# Patient Record
Sex: Male | Born: 1940
Health system: Southern US, Community
[De-identification: ages and names within clinical notes are randomized; demographics above are authoritative.]

## PROBLEM LIST (undated history)

## (undated) DIAGNOSIS — E875 Hyperkalemia: Secondary | ICD-10-CM

## (undated) DIAGNOSIS — E119 Type 2 diabetes mellitus without complications: Secondary | ICD-10-CM

## (undated) DIAGNOSIS — K219 Gastro-esophageal reflux disease without esophagitis: Secondary | ICD-10-CM

## (undated) DIAGNOSIS — D631 Anemia in chronic kidney disease: Secondary | ICD-10-CM

## (undated) DIAGNOSIS — C8590 Non-Hodgkin lymphoma, unspecified, unspecified site: Secondary | ICD-10-CM

## (undated) DIAGNOSIS — N185 Chronic kidney disease, stage 5: Secondary | ICD-10-CM

## (undated) DIAGNOSIS — N189 Chronic kidney disease, unspecified: Secondary | ICD-10-CM

## (undated) DIAGNOSIS — M109 Gout, unspecified: Secondary | ICD-10-CM

## (undated) DIAGNOSIS — E039 Hypothyroidism, unspecified: Secondary | ICD-10-CM

## (undated) DIAGNOSIS — E079 Disorder of thyroid, unspecified: Secondary | ICD-10-CM

## (undated) DIAGNOSIS — E349 Endocrine disorder, unspecified: Principal | ICD-10-CM

## (undated) DIAGNOSIS — Z87442 Personal history of urinary calculi: Secondary | ICD-10-CM

## (undated) DIAGNOSIS — I10 Essential (primary) hypertension: Secondary | ICD-10-CM

## (undated) DIAGNOSIS — Z201 Contact with and (suspected) exposure to tuberculosis: Secondary | ICD-10-CM

## (undated) DIAGNOSIS — C8307 Small cell B-cell lymphoma, spleen: Secondary | ICD-10-CM

## (undated) DIAGNOSIS — N184 Chronic kidney disease, stage 4 (severe): Secondary | ICD-10-CM

## (undated) HISTORY — DX: Endocrine disorder, unspecified: E34.9

## (undated) HISTORY — DX: Contact with and (suspected) exposure to tuberculosis: Z20.1

## (undated) HISTORY — DX: Hyperkalemia: E87.5

## (undated) HISTORY — PX: OTHER SURGICAL HISTORY: SHX169

## (undated) HISTORY — DX: Anemia in chronic kidney disease: D63.1

## (undated) HISTORY — DX: Disorder of thyroid, unspecified: E07.9

## (undated) HISTORY — DX: Chronic kidney disease, stage 4 (severe): N18.4

## (undated) HISTORY — DX: Essential (primary) hypertension: I10

## (undated) HISTORY — DX: Chronic kidney disease, stage 5: N18.5

## (undated) HISTORY — PX: INGUINAL HERNIA REPAIR: SUR1180

## (undated) HISTORY — PX: TONSILLECTOMY: SUR1361

## (undated) HISTORY — DX: Small cell b-cell lymphoma, spleen: C83.07

---
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
## 2004-08-08 ENCOUNTER — Emergency Department (HOSPITAL_COMMUNITY): Admission: EM | Admit: 2004-08-08 | Discharge: 2004-08-08 | Payer: Self-pay | Admitting: Family Medicine

## 2004-08-11 ENCOUNTER — Emergency Department (HOSPITAL_COMMUNITY): Admission: EM | Admit: 2004-08-11 | Discharge: 2004-08-11 | Payer: Self-pay | Admitting: Family Medicine

## 2004-08-15 ENCOUNTER — Emergency Department (HOSPITAL_COMMUNITY): Admission: EM | Admit: 2004-08-15 | Discharge: 2004-08-15 | Payer: Self-pay | Admitting: Family Medicine

## 2004-12-18 ENCOUNTER — Emergency Department (HOSPITAL_COMMUNITY): Admission: EM | Admit: 2004-12-18 | Discharge: 2004-12-18 | Payer: Self-pay | Admitting: *Deleted

## 2007-05-02 HISTORY — PX: SPLENECTOMY, TOTAL: SHX788

## 2007-05-02 HISTORY — PX: PANCREATECTOMY: SHX1019

## 2007-07-09 ENCOUNTER — Encounter: Admission: RE | Admit: 2007-07-09 | Discharge: 2007-07-09 | Payer: Self-pay | Admitting: Specialist

## 2007-07-17 ENCOUNTER — Encounter: Admission: RE | Admit: 2007-07-17 | Discharge: 2007-07-17 | Payer: Self-pay | Admitting: Specialist

## 2007-08-05 ENCOUNTER — Encounter: Payer: Self-pay | Admitting: Pulmonary Disease

## 2007-08-08 ENCOUNTER — Encounter: Admission: RE | Admit: 2007-08-08 | Discharge: 2007-08-08 | Payer: Self-pay | Admitting: Specialist

## 2007-08-15 ENCOUNTER — Encounter: Payer: Self-pay | Admitting: Pulmonary Disease

## 2007-08-15 ENCOUNTER — Encounter: Admission: RE | Admit: 2007-08-15 | Discharge: 2007-08-15 | Payer: Self-pay | Admitting: Specialist

## 2007-08-28 ENCOUNTER — Ambulatory Visit: Payer: Self-pay | Admitting: Pulmonary Disease

## 2007-08-28 DIAGNOSIS — J90 Pleural effusion, not elsewhere classified: Secondary | ICD-10-CM | POA: Insufficient documentation

## 2007-08-28 DIAGNOSIS — J309 Allergic rhinitis, unspecified: Secondary | ICD-10-CM | POA: Insufficient documentation

## 2007-08-28 DIAGNOSIS — E119 Type 2 diabetes mellitus without complications: Secondary | ICD-10-CM

## 2007-08-28 DIAGNOSIS — I10 Essential (primary) hypertension: Secondary | ICD-10-CM

## 2007-08-29 LAB — CONVERTED CEMR LAB
INR: 1.3 — ABNORMAL HIGH (ref 0.8–1.0)
Prothrombin Time: 14.9 s — ABNORMAL HIGH (ref 10.9–13.3)
aPTT: 24.5 s (ref 21.7–29.8)

## 2007-09-02 ENCOUNTER — Ambulatory Visit (HOSPITAL_COMMUNITY): Admission: RE | Admit: 2007-09-02 | Discharge: 2007-09-02 | Payer: Self-pay | Admitting: Pulmonary Disease

## 2007-09-02 ENCOUNTER — Encounter: Payer: Self-pay | Admitting: Pulmonary Disease

## 2007-09-02 ENCOUNTER — Encounter (INDEPENDENT_AMBULATORY_CARE_PROVIDER_SITE_OTHER): Payer: Self-pay | Admitting: Interventional Radiology

## 2007-09-03 ENCOUNTER — Encounter: Payer: Self-pay | Admitting: Pulmonary Disease

## 2007-09-04 ENCOUNTER — Telehealth (INDEPENDENT_AMBULATORY_CARE_PROVIDER_SITE_OTHER): Payer: Self-pay | Admitting: *Deleted

## 2007-09-05 ENCOUNTER — Encounter: Admission: RE | Admit: 2007-09-05 | Discharge: 2007-09-05 | Payer: Self-pay | Admitting: Pulmonary Disease

## 2007-09-10 ENCOUNTER — Telehealth: Payer: Self-pay | Admitting: Pulmonary Disease

## 2007-09-11 ENCOUNTER — Telehealth: Payer: Self-pay | Admitting: Pulmonary Disease

## 2007-09-12 ENCOUNTER — Encounter: Admission: RE | Admit: 2007-09-12 | Discharge: 2007-09-12 | Payer: Self-pay | Admitting: Specialist

## 2007-09-24 ENCOUNTER — Ambulatory Visit: Payer: Self-pay | Admitting: Hematology & Oncology

## 2007-10-18 ENCOUNTER — Encounter: Payer: Self-pay | Admitting: Hematology & Oncology

## 2007-10-18 ENCOUNTER — Other Ambulatory Visit: Admission: RE | Admit: 2007-10-18 | Discharge: 2007-10-18 | Payer: Self-pay | Admitting: Hematology & Oncology

## 2007-10-18 LAB — CBC & DIFF AND RETIC
Basophils Absolute: 0 10*3/uL (ref 0.0–0.1)
EOS%: 2.7 % (ref 0.0–7.0)
Eosinophils Absolute: 0.1 10*3/uL (ref 0.0–0.5)
HCT: 32.5 % — ABNORMAL LOW (ref 38.7–49.9)
HGB: 11.3 g/dL — ABNORMAL LOW (ref 13.0–17.1)
IRF: 0.37 (ref 0.070–0.380)
MCH: 31.7 pg (ref 28.0–33.4)
NEUT#: 2.6 10*3/uL (ref 1.5–6.5)
NEUT%: 62.9 % (ref 40.0–75.0)
lymph#: 1.2 10*3/uL (ref 0.9–3.3)

## 2007-10-18 LAB — MORPHOLOGY: PLT EST: DECREASED

## 2007-10-22 LAB — PROTEIN ELECTROPHORESIS, SERUM
Beta 2: 2.9 % — ABNORMAL LOW (ref 3.2–6.5)
Gamma Globulin: 12.3 % (ref 11.1–18.8)

## 2007-10-22 LAB — COMPREHENSIVE METABOLIC PANEL
ALT: 11 U/L (ref 0–53)
AST: 15 U/L (ref 0–37)
Albumin: 4.4 g/dL (ref 3.5–5.2)
CO2: 19 mEq/L (ref 19–32)
Calcium: 9.5 mg/dL (ref 8.4–10.5)
Chloride: 111 mEq/L (ref 96–112)
Creatinine, Ser: 1.69 mg/dL — ABNORMAL HIGH (ref 0.40–1.50)
Potassium: 4.8 mEq/L (ref 3.5–5.3)
Sodium: 142 mEq/L (ref 135–145)
Total Protein: 6.9 g/dL (ref 6.0–8.3)

## 2007-10-22 LAB — FLOW CYTOMETRY

## 2007-10-22 LAB — HAPTOGLOBIN: Haptoglobin: 116 mg/dL (ref 16–200)

## 2007-10-22 LAB — LACTATE DEHYDROGENASE: LDH: 199 U/L (ref 94–250)

## 2007-10-23 ENCOUNTER — Ambulatory Visit (HOSPITAL_COMMUNITY): Admission: RE | Admit: 2007-10-23 | Discharge: 2007-10-23 | Payer: Self-pay | Admitting: Hematology & Oncology

## 2007-10-23 ENCOUNTER — Encounter: Payer: Self-pay | Admitting: Hematology & Oncology

## 2007-12-17 ENCOUNTER — Ambulatory Visit (HOSPITAL_COMMUNITY): Admission: RE | Admit: 2007-12-17 | Discharge: 2007-12-17 | Payer: Self-pay | Admitting: General Surgery

## 2008-02-03 ENCOUNTER — Encounter (INDEPENDENT_AMBULATORY_CARE_PROVIDER_SITE_OTHER): Payer: Self-pay | Admitting: General Surgery

## 2008-02-03 ENCOUNTER — Inpatient Hospital Stay (HOSPITAL_COMMUNITY): Admission: RE | Admit: 2008-02-03 | Discharge: 2008-02-17 | Payer: Self-pay | Admitting: General Surgery

## 2008-02-13 ENCOUNTER — Ambulatory Visit: Payer: Self-pay | Admitting: Physical Medicine & Rehabilitation

## 2008-02-14 ENCOUNTER — Ambulatory Visit: Payer: Self-pay | Admitting: Hematology & Oncology

## 2008-03-17 ENCOUNTER — Ambulatory Visit: Payer: Self-pay | Admitting: Hematology & Oncology

## 2008-03-18 LAB — COMPREHENSIVE METABOLIC PANEL
ALT: 18 U/L (ref 0–53)
AST: 16 U/L (ref 0–37)
Albumin: 3.8 g/dL (ref 3.5–5.2)
Alkaline Phosphatase: 130 U/L — ABNORMAL HIGH (ref 39–117)
Potassium: 5 mEq/L (ref 3.5–5.3)
Sodium: 138 mEq/L (ref 135–145)
Total Bilirubin: 0.7 mg/dL (ref 0.3–1.2)
Total Protein: 7.1 g/dL (ref 6.0–8.3)

## 2008-03-18 LAB — CBC WITH DIFFERENTIAL (CANCER CENTER ONLY)
BASO#: 0.1 10*3/uL (ref 0.0–0.2)
HCT: 31.7 % — ABNORMAL LOW (ref 38.7–49.9)
HGB: 10.7 g/dL — ABNORMAL LOW (ref 13.0–17.1)
LYMPH#: 1.8 10*3/uL (ref 0.9–3.3)
LYMPH%: 23.2 % (ref 14.0–48.0)
MCHC: 33.7 g/dL (ref 32.0–35.9)
MCV: 91 fL (ref 82–98)
MONO#: 1 10*3/uL — ABNORMAL HIGH (ref 0.1–0.9)
NEUT%: 57.8 % (ref 40.0–80.0)
WBC: 7.9 10*3/uL (ref 4.0–10.0)

## 2008-03-18 LAB — FERRITIN: Ferritin: 319 ng/mL (ref 22–322)

## 2008-05-13 ENCOUNTER — Ambulatory Visit: Payer: Self-pay | Admitting: Hematology & Oncology

## 2008-05-14 LAB — COMPREHENSIVE METABOLIC PANEL
ALT: 17 U/L (ref 0–53)
AST: 18 U/L (ref 0–37)
BUN: 25 mg/dL — ABNORMAL HIGH (ref 6–23)
CO2: 26 mEq/L (ref 19–32)
Calcium: 10.1 mg/dL (ref 8.4–10.5)
Chloride: 103 mEq/L (ref 96–112)
Creatinine, Ser: 1.28 mg/dL (ref 0.40–1.50)
Total Bilirubin: 0.7 mg/dL (ref 0.3–1.2)

## 2008-05-14 LAB — CBC WITH DIFFERENTIAL (CANCER CENTER ONLY)
BASO%: 0.7 % (ref 0.0–2.0)
Eosinophils Absolute: 0.4 10*3/uL (ref 0.0–0.5)
HCT: 37.6 % — ABNORMAL LOW (ref 38.7–49.9)
LYMPH#: 1.5 10*3/uL (ref 0.9–3.3)
MCV: 92 fL (ref 82–98)
MONO#: 0.9 10*3/uL (ref 0.1–0.9)
NEUT%: 32.4 % — ABNORMAL LOW (ref 40.0–80.0)
RBC: 4.08 10*6/uL — ABNORMAL LOW (ref 4.20–5.70)
RDW: 13.4 % (ref 10.5–14.6)
WBC: 4.2 10*3/uL (ref 4.0–10.0)

## 2008-05-14 LAB — LACTATE DEHYDROGENASE: LDH: 191 U/L (ref 94–250)

## 2008-06-08 ENCOUNTER — Ambulatory Visit: Payer: Self-pay | Admitting: Diagnostic Radiology

## 2008-06-08 ENCOUNTER — Ambulatory Visit (HOSPITAL_BASED_OUTPATIENT_CLINIC_OR_DEPARTMENT_OTHER): Admission: RE | Admit: 2008-06-08 | Discharge: 2008-06-08 | Payer: Self-pay | Admitting: Hematology & Oncology

## 2008-07-02 ENCOUNTER — Ambulatory Visit: Payer: Self-pay | Admitting: Hematology & Oncology

## 2008-07-03 LAB — TSH: TSH: 1.865 u[IU]/mL (ref 0.350–4.500)

## 2008-07-03 LAB — CBC WITH DIFFERENTIAL (CANCER CENTER ONLY)
BASO%: 0.5 % (ref 0.0–2.0)
Eosinophils Absolute: 0.7 10*3/uL — ABNORMAL HIGH (ref 0.0–0.5)
LYMPH#: 1.5 10*3/uL (ref 0.9–3.3)
LYMPH%: 13.6 % — ABNORMAL LOW (ref 14.0–48.0)
MCV: 93 fL (ref 82–98)
MONO#: 0.9 10*3/uL (ref 0.1–0.9)
NEUT#: 7.6 10*3/uL — ABNORMAL HIGH (ref 1.5–6.5)
Platelets: 421 10*3/uL — ABNORMAL HIGH (ref 145–400)
RBC: 3.76 10*6/uL — ABNORMAL LOW (ref 4.20–5.70)
RDW: 11.8 % (ref 10.5–14.6)
WBC: 10.6 10*3/uL — ABNORMAL HIGH (ref 4.0–10.0)

## 2008-07-03 LAB — CHCC SATELLITE - SMEAR

## 2008-07-03 LAB — COMPREHENSIVE METABOLIC PANEL
ALT: 16 U/L (ref 0–53)
CO2: 18 mEq/L — ABNORMAL LOW (ref 19–32)
Calcium: 9.4 mg/dL (ref 8.4–10.5)
Chloride: 102 mEq/L (ref 96–112)
Creatinine, Ser: 1.4 mg/dL (ref 0.40–1.50)
Sodium: 135 mEq/L (ref 135–145)
Total Protein: 7.1 g/dL (ref 6.0–8.3)

## 2008-07-03 LAB — TESTOSTERONE: Testosterone: 240.2 ng/dL — ABNORMAL LOW (ref 350–890)

## 2008-08-07 LAB — LACTATE DEHYDROGENASE: LDH: 173 U/L (ref 94–250)

## 2008-08-07 LAB — CBC WITH DIFFERENTIAL (CANCER CENTER ONLY)
BASO%: 0.6 % (ref 0.0–2.0)
EOS%: 4.4 % (ref 0.0–7.0)
HCT: 36.1 % — ABNORMAL LOW (ref 38.7–49.9)
LYMPH%: 12.3 % — ABNORMAL LOW (ref 14.0–48.0)
MCHC: 33.4 g/dL (ref 32.0–35.9)
MCV: 96 fL (ref 82–98)
MONO%: 9.6 % (ref 0.0–13.0)
NEUT%: 73.1 % (ref 40.0–80.0)
RDW: 12.2 % (ref 10.5–14.6)

## 2008-08-07 LAB — COMPREHENSIVE METABOLIC PANEL
ALT: 19 U/L (ref 0–53)
Albumin: 4.7 g/dL (ref 3.5–5.2)
CO2: 18 mEq/L — ABNORMAL LOW (ref 19–32)
Calcium: 10 mg/dL (ref 8.4–10.5)
Chloride: 110 mEq/L (ref 96–112)
Glucose, Bld: 191 mg/dL — ABNORMAL HIGH (ref 70–99)
Sodium: 137 mEq/L (ref 135–145)
Total Protein: 7.7 g/dL (ref 6.0–8.3)

## 2008-08-10 LAB — BASIC METABOLIC PANEL - CANCER CENTER ONLY
Calcium: 9.6 mg/dL (ref 8.0–10.3)
Creat: 1.2 mg/dl (ref 0.6–1.2)

## 2008-08-14 LAB — CMP (CANCER CENTER ONLY)
ALT(SGPT): 22 U/L (ref 10–47)
AST: 24 U/L (ref 11–38)
Albumin: 3.5 g/dL (ref 3.3–5.5)
Calcium: 9.6 mg/dL (ref 8.0–10.3)
Chloride: 107 mEq/L (ref 98–108)
Creat: 1.4 mg/dl — ABNORMAL HIGH (ref 0.6–1.2)
Potassium: 6 mEq/L — ABNORMAL HIGH (ref 3.3–4.7)
Sodium: 145 mEq/L (ref 128–145)
Total Protein: 7.2 g/dL (ref 6.4–8.1)

## 2008-08-20 ENCOUNTER — Ambulatory Visit: Payer: Self-pay | Admitting: Hematology & Oncology

## 2008-08-21 LAB — CBC WITH DIFFERENTIAL (CANCER CENTER ONLY)
BASO%: 0.6 % (ref 0.0–2.0)
Eosinophils Absolute: 0.5 10*3/uL (ref 0.0–0.5)
HCT: 31.7 % — ABNORMAL LOW (ref 38.7–49.9)
LYMPH%: 15 % (ref 14.0–48.0)
MCV: 95 fL (ref 82–98)
MONO#: 1 10*3/uL — ABNORMAL HIGH (ref 0.1–0.9)
NEUT%: 67.4 % (ref 40.0–80.0)
RDW: 13.2 % (ref 10.5–14.6)
WBC: 8.7 10*3/uL (ref 4.0–10.0)

## 2008-08-21 LAB — CMP (CANCER CENTER ONLY)
ALT(SGPT): 32 U/L (ref 10–47)
CO2: 24 mEq/L (ref 18–33)
Creat: 1.4 mg/dl — ABNORMAL HIGH (ref 0.6–1.2)
Total Bilirubin: 0.6 mg/dl (ref 0.20–1.60)

## 2008-09-09 ENCOUNTER — Ambulatory Visit (HOSPITAL_COMMUNITY): Admission: RE | Admit: 2008-09-09 | Discharge: 2008-09-09 | Payer: Self-pay | Admitting: Nephrology

## 2008-10-02 ENCOUNTER — Ambulatory Visit: Payer: Self-pay | Admitting: Diagnostic Radiology

## 2008-10-02 ENCOUNTER — Ambulatory Visit (HOSPITAL_BASED_OUTPATIENT_CLINIC_OR_DEPARTMENT_OTHER): Admission: RE | Admit: 2008-10-02 | Discharge: 2008-10-02 | Payer: Self-pay | Admitting: Hematology & Oncology

## 2008-10-14 ENCOUNTER — Ambulatory Visit: Payer: Self-pay | Admitting: Hematology & Oncology

## 2008-10-15 LAB — BASIC METABOLIC PANEL - CANCER CENTER ONLY
Chloride: 105 mEq/L (ref 98–108)
Potassium: 5.5 mEq/L — ABNORMAL HIGH (ref 3.3–4.7)
Sodium: 146 mEq/L — ABNORMAL HIGH (ref 128–145)

## 2008-10-15 LAB — CBC WITH DIFFERENTIAL (CANCER CENTER ONLY)
BASO#: 0 10*3/uL (ref 0.0–0.2)
Eosinophils Absolute: 0.4 10*3/uL (ref 0.0–0.5)
HCT: 38.8 % (ref 38.7–49.9)
HGB: 12.9 g/dL — ABNORMAL LOW (ref 13.0–17.1)
LYMPH%: 27 % (ref 14.0–48.0)
MCV: 100 fL — ABNORMAL HIGH (ref 82–98)
MONO#: 0.7 10*3/uL (ref 0.1–0.9)
Platelets: 343 10*3/uL (ref 145–400)
RBC: 3.9 10*6/uL — ABNORMAL LOW (ref 4.20–5.70)
WBC: 5 10*3/uL (ref 4.0–10.0)

## 2008-10-15 LAB — LACTATE DEHYDROGENASE: LDH: 153 U/L (ref 94–250)

## 2009-01-06 ENCOUNTER — Ambulatory Visit: Payer: Self-pay | Admitting: Diagnostic Radiology

## 2009-01-06 ENCOUNTER — Ambulatory Visit (HOSPITAL_BASED_OUTPATIENT_CLINIC_OR_DEPARTMENT_OTHER): Admission: RE | Admit: 2009-01-06 | Discharge: 2009-01-06 | Payer: Self-pay | Admitting: Hematology & Oncology

## 2009-01-14 ENCOUNTER — Ambulatory Visit: Payer: Self-pay | Admitting: Hematology & Oncology

## 2009-01-15 LAB — BASIC METABOLIC PANEL - CANCER CENTER ONLY
BUN, Bld: 28 mg/dL — ABNORMAL HIGH (ref 7–22)
Creat: 1.5 mg/dl — ABNORMAL HIGH (ref 0.6–1.2)
Glucose, Bld: 70 mg/dL — ABNORMAL LOW (ref 73–118)
Potassium: 4.7 mEq/L (ref 3.3–4.7)

## 2009-01-15 LAB — CBC WITH DIFFERENTIAL (CANCER CENTER ONLY)
Eosinophils Absolute: 0.4 10*3/uL (ref 0.0–0.5)
HGB: 12.8 g/dL — ABNORMAL LOW (ref 13.0–17.1)
LYMPH#: 1.4 10*3/uL (ref 0.9–3.3)
MONO#: 1 10*3/uL — ABNORMAL HIGH (ref 0.1–0.9)
MONO%: 17.5 % — ABNORMAL HIGH (ref 0.0–13.0)
NEUT#: 2.8 10*3/uL (ref 1.5–6.5)
Platelets: 392 10*3/uL (ref 145–400)
RBC: 3.84 10*6/uL — ABNORMAL LOW (ref 4.20–5.70)
WBC: 5.6 10*3/uL (ref 4.0–10.0)

## 2009-01-15 LAB — LACTATE DEHYDROGENASE: LDH: 183 U/L (ref 94–250)

## 2009-04-13 ENCOUNTER — Ambulatory Visit (HOSPITAL_BASED_OUTPATIENT_CLINIC_OR_DEPARTMENT_OTHER): Admission: RE | Admit: 2009-04-13 | Discharge: 2009-04-13 | Payer: Self-pay | Admitting: Hematology & Oncology

## 2009-04-13 ENCOUNTER — Ambulatory Visit: Payer: Self-pay | Admitting: Diagnostic Radiology

## 2009-04-16 ENCOUNTER — Ambulatory Visit: Payer: Self-pay | Admitting: Hematology & Oncology

## 2009-04-19 LAB — CBC WITH DIFFERENTIAL (CANCER CENTER ONLY)
EOS%: 10.2 % — ABNORMAL HIGH (ref 0.0–7.0)
LYMPH#: 1.5 10*3/uL (ref 0.9–3.3)
MCH: 33.2 pg (ref 28.0–33.4)
MCHC: 34.9 g/dL (ref 32.0–35.9)
MONO%: 15.1 % — ABNORMAL HIGH (ref 0.0–13.0)
NEUT#: 3.2 10*3/uL (ref 1.5–6.5)
Platelets: 312 10*3/uL (ref 145–400)
RBC: 3.88 10*6/uL — ABNORMAL LOW (ref 4.20–5.70)

## 2009-04-20 LAB — COMPREHENSIVE METABOLIC PANEL
ALT: 23 U/L (ref 0–53)
AST: 20 U/L (ref 0–37)
CO2: 26 mEq/L (ref 19–32)
Calcium: 9.9 mg/dL (ref 8.4–10.5)
Chloride: 105 mEq/L (ref 96–112)
Creatinine, Ser: 1.59 mg/dL — ABNORMAL HIGH (ref 0.40–1.50)
Potassium: 5.1 mEq/L (ref 3.5–5.3)
Sodium: 141 mEq/L (ref 135–145)
Total Protein: 7.5 g/dL (ref 6.0–8.3)

## 2009-04-20 LAB — LACTATE DEHYDROGENASE: LDH: 176 U/L (ref 94–250)

## 2009-08-10 ENCOUNTER — Ambulatory Visit: Payer: Self-pay | Admitting: Hematology & Oncology

## 2009-08-11 ENCOUNTER — Ambulatory Visit: Payer: Self-pay | Admitting: Diagnostic Radiology

## 2009-08-11 ENCOUNTER — Ambulatory Visit (HOSPITAL_BASED_OUTPATIENT_CLINIC_OR_DEPARTMENT_OTHER): Admission: RE | Admit: 2009-08-11 | Discharge: 2009-08-11 | Payer: Self-pay | Admitting: Hematology & Oncology

## 2009-08-11 LAB — CBC WITH DIFFERENTIAL (CANCER CENTER ONLY)
BASO#: 0.1 10*3/uL (ref 0.0–0.2)
BASO%: 0.8 % (ref 0.0–2.0)
EOS%: 7.7 % — ABNORMAL HIGH (ref 0.0–7.0)
HGB: 13 g/dL (ref 13.0–17.1)
MCH: 32.6 pg (ref 28.0–33.4)
MCHC: 33.2 g/dL (ref 32.0–35.9)
MONO%: 13.7 % — ABNORMAL HIGH (ref 0.0–13.0)
NEUT#: 4.9 10*3/uL (ref 1.5–6.5)
RDW: 11.1 % (ref 10.5–14.6)

## 2009-08-11 LAB — CMP (CANCER CENTER ONLY)
AST: 29 U/L (ref 11–38)
Albumin: 4 g/dL (ref 3.3–5.5)
Alkaline Phosphatase: 90 U/L — ABNORMAL HIGH (ref 26–84)
BUN, Bld: 33 mg/dL — ABNORMAL HIGH (ref 7–22)
Creat: 1.5 mg/dl — ABNORMAL HIGH (ref 0.6–1.2)
Potassium: 4.5 mEq/L (ref 3.3–4.7)

## 2009-08-11 LAB — VITAMIN D 25 HYDROXY (VIT D DEFICIENCY, FRACTURES): Vit D, 25-Hydroxy: 22 ng/mL — ABNORMAL LOW (ref 30–89)

## 2009-12-13 ENCOUNTER — Ambulatory Visit: Payer: Self-pay | Admitting: Hematology & Oncology

## 2009-12-15 ENCOUNTER — Ambulatory Visit (HOSPITAL_BASED_OUTPATIENT_CLINIC_OR_DEPARTMENT_OTHER): Admission: RE | Admit: 2009-12-15 | Discharge: 2009-12-15 | Payer: Self-pay | Admitting: Hematology & Oncology

## 2009-12-15 ENCOUNTER — Ambulatory Visit: Payer: Self-pay | Admitting: Diagnostic Radiology

## 2009-12-15 LAB — CBC WITH DIFFERENTIAL (CANCER CENTER ONLY)
BASO%: 0.7 % (ref 0.0–2.0)
EOS%: 9.7 % — ABNORMAL HIGH (ref 0.0–7.0)
LYMPH%: 26.7 % (ref 14.0–48.0)
MCH: 33.4 pg (ref 28.0–33.4)
MCHC: 34 g/dL (ref 32.0–35.9)
MCV: 98 fL (ref 82–98)
MONO%: 13 % (ref 0.0–13.0)
NEUT#: 3.4 10*3/uL (ref 1.5–6.5)
Platelets: 317 10*3/uL (ref 145–400)
RDW: 11.8 % (ref 10.5–14.6)

## 2009-12-15 LAB — CMP (CANCER CENTER ONLY)
ALT(SGPT): 27 U/L (ref 10–47)
Albumin: 4.4 g/dL (ref 3.3–5.5)
Alkaline Phosphatase: 81 U/L (ref 26–84)
Potassium: 5 mEq/L — ABNORMAL HIGH (ref 3.3–4.7)
Sodium: 145 mEq/L (ref 128–145)
Total Bilirubin: 0.7 mg/dl (ref 0.20–1.60)
Total Protein: 8 g/dL (ref 6.4–8.1)

## 2009-12-15 LAB — LACTATE DEHYDROGENASE: LDH: 171 U/L (ref 94–250)

## 2010-01-12 ENCOUNTER — Ambulatory Visit: Payer: Self-pay | Admitting: Hematology & Oncology

## 2010-02-21 ENCOUNTER — Ambulatory Visit: Payer: Self-pay | Admitting: Hematology & Oncology

## 2010-04-04 ENCOUNTER — Ambulatory Visit: Payer: Self-pay | Admitting: Hematology & Oncology

## 2010-05-17 ENCOUNTER — Ambulatory Visit (HOSPITAL_BASED_OUTPATIENT_CLINIC_OR_DEPARTMENT_OTHER): Payer: Medicare Other | Admitting: Hematology & Oncology

## 2010-05-19 ENCOUNTER — Other Ambulatory Visit: Payer: Self-pay | Admitting: Hematology & Oncology

## 2010-05-19 DIAGNOSIS — C8590 Non-Hodgkin lymphoma, unspecified, unspecified site: Secondary | ICD-10-CM

## 2010-05-22 ENCOUNTER — Encounter: Payer: Self-pay | Admitting: Pulmonary Disease

## 2010-06-01 ENCOUNTER — Encounter: Payer: Medicare Other | Admitting: Hematology & Oncology

## 2010-06-01 ENCOUNTER — Ambulatory Visit (INDEPENDENT_AMBULATORY_CARE_PROVIDER_SITE_OTHER)
Admission: RE | Admit: 2010-06-01 | Discharge: 2010-06-01 | Disposition: A | Discharge status: Home / Self Care | Payer: Medicare Other | Source: Ambulatory Visit | Attending: Hematology & Oncology | Admitting: Hematology & Oncology

## 2010-06-01 ENCOUNTER — Ambulatory Visit (HOSPITAL_BASED_OUTPATIENT_CLINIC_OR_DEPARTMENT_OTHER)
Admission: RE | Admit: 2010-06-01 | Discharge: 2010-06-01 | Disposition: A | Discharge status: Home / Self Care | Payer: Medicare Other | Source: Ambulatory Visit | Attending: Hematology & Oncology | Admitting: Hematology & Oncology

## 2010-06-01 ENCOUNTER — Encounter (HOSPITAL_BASED_OUTPATIENT_CLINIC_OR_DEPARTMENT_OTHER): Payer: Self-pay

## 2010-06-01 DIAGNOSIS — C61 Malignant neoplasm of prostate: Secondary | ICD-10-CM

## 2010-06-01 DIAGNOSIS — E291 Testicular hypofunction: Secondary | ICD-10-CM

## 2010-06-01 DIAGNOSIS — C8590 Non-Hodgkin lymphoma, unspecified, unspecified site: Secondary | ICD-10-CM

## 2010-06-01 DIAGNOSIS — C8589 Other specified types of non-Hodgkin lymphoma, extranodal and solid organ sites: Secondary | ICD-10-CM

## 2010-06-01 LAB — CBC WITH DIFFERENTIAL (CANCER CENTER ONLY)
BASO#: 0.1 10*3/uL (ref 0.0–0.2)
EOS%: 7.4 % — ABNORMAL HIGH (ref 0.0–7.0)
Eosinophils Absolute: 0.6 10*3/uL — ABNORMAL HIGH (ref 0.0–0.5)
MCV: 97 fL (ref 82–98)
MONO%: 14.1 % — ABNORMAL HIGH (ref 0.0–13.0)
NEUT%: 51.4 % (ref 40.0–80.0)
Platelets: 306 10*3/uL (ref 145–400)
RDW: 11.1 % (ref 10.5–14.6)
WBC: 7.6 10*3/uL (ref 4.0–10.0)

## 2010-06-01 LAB — CMP (CANCER CENTER ONLY)
AST: 22 U/L (ref 11–38)
Albumin: 3.7 g/dL (ref 3.3–5.5)
Alkaline Phosphatase: 101 U/L — ABNORMAL HIGH (ref 26–84)
BUN, Bld: 23 mg/dL — ABNORMAL HIGH (ref 7–22)
Calcium: 9.1 mg/dL (ref 8.0–10.3)
Chloride: 100 mEq/L (ref 98–108)
Creat: 1.5 mg/dl — ABNORMAL HIGH (ref 0.6–1.2)
Glucose, Bld: 198 mg/dL — ABNORMAL HIGH (ref 73–118)
Potassium: 4.3 mEq/L (ref 3.3–4.7)

## 2010-06-01 LAB — PSA: PSA: 5.07 ng/mL — ABNORMAL HIGH (ref <=4.00)

## 2010-06-08 ENCOUNTER — Encounter (HOSPITAL_BASED_OUTPATIENT_CLINIC_OR_DEPARTMENT_OTHER): Payer: Medicare Other | Admitting: Hematology & Oncology

## 2010-06-08 DIAGNOSIS — E291 Testicular hypofunction: Secondary | ICD-10-CM

## 2010-06-08 DIAGNOSIS — C9000 Multiple myeloma not having achieved remission: Secondary | ICD-10-CM

## 2010-06-14 ENCOUNTER — Encounter: Payer: Self-pay | Admitting: Hematology & Oncology

## 2010-06-29 ENCOUNTER — Encounter (HOSPITAL_BASED_OUTPATIENT_CLINIC_OR_DEPARTMENT_OTHER): Payer: Medicare Other | Admitting: Hematology & Oncology

## 2010-06-29 DIAGNOSIS — E291 Testicular hypofunction: Secondary | ICD-10-CM

## 2010-07-20 ENCOUNTER — Encounter (HOSPITAL_BASED_OUTPATIENT_CLINIC_OR_DEPARTMENT_OTHER): Payer: Medicare Other | Admitting: Hematology & Oncology

## 2010-07-20 DIAGNOSIS — E291 Testicular hypofunction: Secondary | ICD-10-CM

## 2010-08-09 LAB — ACTH STIMULATION, 3 TIME POINTS: Cortisol, Base: 7.8 ug/dL

## 2010-08-10 ENCOUNTER — Encounter (HOSPITAL_BASED_OUTPATIENT_CLINIC_OR_DEPARTMENT_OTHER): Payer: Medicare Other | Admitting: Hematology & Oncology

## 2010-08-10 DIAGNOSIS — E291 Testicular hypofunction: Secondary | ICD-10-CM

## 2010-08-31 ENCOUNTER — Encounter (HOSPITAL_BASED_OUTPATIENT_CLINIC_OR_DEPARTMENT_OTHER): Payer: Medicare Other | Admitting: Hematology & Oncology

## 2010-08-31 ENCOUNTER — Other Ambulatory Visit: Payer: Self-pay | Admitting: Hematology & Oncology

## 2010-08-31 DIAGNOSIS — C61 Malignant neoplasm of prostate: Secondary | ICD-10-CM

## 2010-08-31 DIAGNOSIS — C9000 Multiple myeloma not having achieved remission: Secondary | ICD-10-CM

## 2010-08-31 DIAGNOSIS — E291 Testicular hypofunction: Secondary | ICD-10-CM

## 2010-08-31 LAB — COMPREHENSIVE METABOLIC PANEL
Albumin: 4.1 g/dL (ref 3.5–5.2)
CO2: 21 mEq/L (ref 19–32)
Glucose, Bld: 108 mg/dL — ABNORMAL HIGH (ref 70–99)
Potassium: 4.6 mEq/L (ref 3.5–5.3)
Sodium: 139 mEq/L (ref 135–145)
Total Protein: 6.9 g/dL (ref 6.0–8.3)

## 2010-08-31 LAB — PSA: PSA: 3.59 ng/mL (ref <=4.00)

## 2010-08-31 LAB — LACTATE DEHYDROGENASE: LDH: 163 U/L (ref 94–250)

## 2010-08-31 LAB — CBC WITH DIFFERENTIAL (CANCER CENTER ONLY)
Eosinophils Absolute: 0.6 10*3/uL — ABNORMAL HIGH (ref 0.0–0.5)
MONO#: 1.5 10*3/uL — ABNORMAL HIGH (ref 0.1–0.9)
NEUT#: 5.2 10*3/uL (ref 1.5–6.5)
Platelets: 282 10*3/uL (ref 145–400)
RBC: 3.64 10*6/uL — ABNORMAL LOW (ref 4.20–5.70)
WBC: 9 10*3/uL (ref 4.0–10.0)

## 2010-09-13 NOTE — Discharge Summary (Signed)
Larry [name], Larry [name]               ACCOUNT NO.:  [id number]   MEDICAL RECORD NO.:  [id number]          PATIENT TYPE:  INP   LOCATION:  1315                         FACILITY:  [facility]   PHYSICIAN:  Angelia Mould. Derrell Lolling, M.D.DATE OF BIRTH:  [birth year]   DATE OF ADMISSION:  02/03/2008  DATE OF DISCHARGE:  02/17/2008                               DISCHARGE SUMMARY   FINAL DIAGNOSES:  1. Splenomegaly and hypersplenism.  2. Marginal zone lymphoma.  3. Type 2 diabetes mellitus.  4. Chronic renal insufficiency.  5. Hypothyroidism.  6. Severe deconditioning.  7. Anasarca.   OPERATIVE PROCEDURE:  Open splenectomy and resection of tip of tail of  pancreas dated February 03, 2008.   HISTORY:  This is a 70 year old white male who presented with weight  loss and malaise and was evaluated by Dr. Arlan Organ.  He had left  pleural effusion which was drained and a bone marrow evaluation which  suggested non-Hodgkins lymphoma.  CT scan showed massive splenomegaly  and enlarged gastrohepatic lymph nodes but no other adenopathy.  He was  given triple vaccines in July 2009.  He has had problems with  pancytopenia, although he has had no bleeding.  He has had pain in his  upper abdomen from his hypersplenism.  He has also been evaluated  because of renal insufficiency and chronic elevated serum potassium when  evaluated as an outpatient.  He is found to be a pleasant older  gentleman a little bit deconditioned with a soft abdomen and a very  large spleen extending all the way to the midline and all the way down  to below the umbilicus.  At Dr. Gustavo Lah recommendation, the patient  was offered the option of splenectomy to control his pancytopenia and  abdominal pain.  Risks were discussed as an outpatient.  He was brought  to hospital electively.   HOSPITAL COURSE:  On the day of admission, the patient was taken to the  operating room and underwent left subcostal incision and open  splenectomy.  He was  found to have an extremely enlarged spleen and some  adenopathy in the area.  The tip of the pancreas was imbedded in the  hilum of the spleen, and we had to staple across this.  The surgery went  well.  A drain was left.   Postoperatively, the patient did relatively well.  He had to have a  Foley catheter for several days, and, in fact, it had to be replaced  once.  After placing on Flomax, we did get the Foley catheter out, and  he is voiding well.   He had an ileus for a few days, but that resolved.  He ultimately  resumed a normal diet which he is tolerating at the time of discharge.  He does have a little bit of diarrhea.  His C. difficile toxin is  negative.   Final pathology report strongly suggested that the spleen was involved  with marginal zone lymphoma, and this was reported to the patient.   During his hospital course, he was seen briefly by Dr. Arlan Organ and  was  also involved with the [facility] hospitalist who assisted with care  of his renal insufficiency, diabetes and deconditioned state.  We had  him evaluated by physical therapy, occupational therapy and Dr. Thomasena Edis  from the rehab center.  He was felt to be a good candidate for rehab,  but apparently there was no bed.  The social worker and case management  staff arranged for him to be transferred to [facility], and  I was told this morning that he could go there today, so we are  dictating his discharge summary.   At the time of discharge, his left upper quadrant wound has healed  fairly well, and the staples were removed.  Steri-Strips were in place.  His left upper quadrant drain has been removed.  He is ambulating in the  hall two to three times a day with assistance.  Abdomen is flat, soft  and nontender.   Most recent set of vital signs revealed temperature 98.3, blood pressure  142/85, pulse 92, respiratory rate 18 and oxygen saturation 96% on room  air.   DISCHARGE MEDICATIONS:  1.  Pepcid 20 mg daily.  2. NovoLog insulin 11 units three times daily subcu.  3. Lantus insulin 15 units at bedtime.  4. Synthroid 125 mcg daily.  5. Metformin 1000 mg daily.  6. Flomax 0.4 mg daily.  7. Vicodin p.r.n.  8. Imodium p.r.n.   FOLLOWUP:  He was asked to return to see me in the office in about two  to three weeks.  He was also asked to make followup appointments with  Dr. Arlan Organ and also with his internist, Dr. Lerry [name].      Angelia Mould. Derrell Lolling, M.D.  Electronically Signed     HMI/MEDQ  D:  02/17/2008  T:  02/17/2008  Job:  [id number]   cc:   Rose Phi. Myna Hidalgo, M.D.  Fax: [phone]   Larry [name], M.D.   Barbaraann Share, MD,FCCP  [address]  [place]  [place] [address]

## 2010-09-13 NOTE — Op Note (Signed)
Larry [name], Larry [name]               ACCOUNT NO.:  [id number]   MEDICAL RECORD NO.:  [id number]          PATIENT TYPE:  INP   LOCATION:  1223                         FACILITY:  [facility]   PHYSICIAN:  Angelia Mould. Derrell Lolling, M.D.DATE OF BIRTH:  May 22, 1940   DATE OF PROCEDURE:  02/03/2008  DATE OF DISCHARGE:                               OPERATIVE REPORT   PREOPERATIVE DIAGNOSES:  1. Splenomegaly and hypersplenism.  2. Non-Hodgkin's lymphoma.   POSTOPERATIVE DIAGNOSES:  1. Splenomegaly and hypersplenism.  2. Non-Hodgkin's lymphoma.   OPERATION PERFORMED:  Open splenectomy.   SURGEON:  Dr. Claud Kelp.   FIRST ASSISTANT:  Dr. Bertram Savin.   OPERATIVE INDICATIONS:  This is a 70 year old white man who presented  with weight loss and malaise and was evaluated by Dr. Arlan Organ.  A  left pleural effusion was drained and a bone marrow suggested a non-  Hodgkins lymphoma.  CT scan showed massive splenomegaly and enlarged  gastrohepatic lymph nodes, but no other adenopathy.  Bone marrow showed  marginal zone lymphoma.  He had triple vaccines in July 2009.  He has  been evaluated by his primary internist, Dr. Mayford Knife because of renal  insufficiency and hyperkalemia, which has been stabilized.  He has a  platelet count of about 70,000 and a hemoglobin about 9.5.  He is  brought to the operating room electively.   OPERATIVE FINDINGS:  The spleen was markedly enlarged and extended all  the way to the midline and all the way down to the umbilicus.  There  were extensive adhesions to the diaphragm.  The tail of the pancreas was  intimately associated with the hilum of the spleen, and we actually had  to transect about 1-cm of the pancreas with a stapling device to get  that off.  I did not feel any mass in the pancreas.  Even though there  was a question of a calcified area in the pancreas by CT, there was no  mass, and we felt that this was most likely avascular calcification.  The liver  felt somewhat firm and edematous, but was normal otherwise.  The stomach felt normal.  I did not feel any retroperitoneal adenopathy.  There were enlarged lymph nodes in the hilum of the spleen and in the  gastrohepatic area.  I did not feel any other masses.   OPERATIVE TECHNIQUE:  Following the induction of general endotracheal  anesthesia, a nasogastric tube and a Foley catheter were placed.  The  abdomen was prepped and draped in a sterile fashion.  Intravenous  antibiotics were given.  The patient was identified as the correct  patient and correct procedure.  A left subcostal incision was made.  Dissection was carried down through the subcutaneous tissue.  The  anterior rectus sheath and external oblique were divided with cautery.  The rectus muscle and internal oblique muscles were divided with  electrocautery.  The posterior rectus sheath and the transversus  abdominus muscles were divided with electrocautery and the abdomen  entered under direct vision.  There was a little bit of clear ascites  present which was evacuated.  We explored the abdomen with the findings  as described above.   We placed a Bookwalter self-retaining retractor.  We took down adhesions  to the diaphragm medially and laterally.  We took down the splenorenal  ligament using the LigaSure.  We divided the gastrocolic omentum and  entered the lesser sac and visualized the body and tail of the pancreas.  We isolated the splenic artery as it went along the superior border of  the tail of the pancreas.  We isolated this very carefully, separating  it from some surrounding adenopathy and placed a 0 silk tie around this  and tied it down to get early ligation of the arterial inflow.  We then  took down all of the short gastric vessels using the LigaSure device.  We were very careful to stay away from the stomach.   We then continued to mobilize the spleen.  There were extensive  adhesions throughout the diaphragm  which we had to take down sharply,  some with electrocautery that we could see directly, and some we had to  take down bluntly with our fingers that were posterior, but ultimately  we got all of these down and all of this splenorenal ligaments down.  We  got the colon mobilized well away from the spleen.  There was no real  problem mobilizing the colon away.  Ultimately, we were able to mobilize  the spleen up into the wound.  We then isolated the lower pole vessels  between clamps and ligated with 2-0 silk ties.  We isolated the splenic  vein which was huge, and we suture ligated that with a 2-0 silk suture  ligature and then doubly tied with 2-0 silk ties and divided it.  We  divided the splenic artery between clamps and tied that off with 2-0  silk ties as well.  We then had nothing but a small tip of the tail of  the pancreas which was intimately associated deep within the hilum of  the spleen, and we could not separate this away.  And so we then placed  a TA-30 stapler with a blue load of staples across the tail of pancreas,  closed it, held in place, fired it, and then transected the spleen and  the tail of the pancreas sharply.  The specimen was then removed.  We  cauterized the pancreatic tip with cautery.   We then irrigated the left upper quadrant and left subphrenic space  fairly extensively.  We cauterized a few areas of bleeding.  There was  mostly diffuse oozing.  We packed this off for a while.  We placed  Tisseel on the tail of the pancreas where we had transected the stapler,  and we had excellent hemostasis there.  We then went back to the  subdiaphragmatic area and placed Avitene on all of the raw areas and  after 5 minutes we had good hemostasis.   A 19-French Blake drain was placed in the left subphrenic space.  This  was placed through a separate stab wound in the left flank and passed  across the tail of the pancreas and then up into the left subphrenic  space,  sutured to the skin with a nylon suture and connected to a  suction bulb.  We positioned the nasogastric tube.  We checked for  bleeding 3-4 times and found no bleeding.  We then closed the posterior  rectus sheath, transversus abdominus and internal oblique with running  suture of #1 PDS.  After irrigating the wound, we closed the anterior  rectus sheath and the external oblique with running suture of #1 PDS.  We then passed an On-Q pain control device through the skin medially and  then into the rectus sheath both above and then a separate one below the  incision.  We then inserted the catheters and removed the peel-away  sheaths.  We then secured this in place with Tegaderm.  The skin was  closed  with skin staples.  Clean bandages were placed, and the patient taken to  the recovery room in stable condition.  Estimated blood loss was about  850 mL.  Complications none.  Sponge and instrument counts were correct.   The patient was infused one bag of single donor platelets and 1 unit  packed of red blood cells in the operating room.      Angelia Mould. Derrell Lolling, M.D.  Electronically Signed     HMI/MEDQ  D:  02/03/2008  T:  02/04/2008  Job:  [id number]   cc:   Rose Phi. Myna Hidalgo, M.D.  Fax: [phone]   [name], MD   Barbaraann Share, MD,FCCP  [address]  [place]  [place] [address]

## 2010-09-13 NOTE — Op Note (Signed)
NAMETAARIQ, LEITZ               ACCOUNT NO.:  [id number]   MEDICAL RECORD NO.:  [id number]          PATIENT TYPE:  AMB   LOCATION:  OMED                         [facility]ACILITY[facility]  [facility]   PHYSICIAN:  Rose Phi. Myna Hidalgo, M.D. DATE OF BIRTH:  05/01/1941   DATE OF PROCEDURE:  10/23/2007  DATE OF DISCHARGE:                               OPERATIVE REPORT   PROCEDURE:  Left posterior iliac crest bone marrow biopsy and aspirate.   Mr. Falls is brought to the short stay unit.  He had an IV placed  without difficulty.   He was then placed onto his right side.  His ASA score was 1.  His  Mallampati score was 2.   He received a total of 5 mg of Versed and 25 mg Demerol for sedation IV.  The left posterior iliac crest region was prepped and draped in sterile  fashion.  There was 8 mL of 2% lidocaine infiltrated in the skin down to  the periosteum.  A #11 scalpel was used to make incision into the skin.  Despite several attempts, only a dilute bone marrow aspirate was  obtained.   We then obtained an excellent bone marrow biopsy core.  We did try  another aspirate attempt through the Jamshidi core needle without any  success.   The core biopsy was divided to be sent off for special studies.   Larry [name] tolerated procedure well.  There were no complications.      Rose Phi. Myna Hidalgo, M.D.  Electronically Signed     PRE/MEDQ  D:  10/23/2007  T:  10/23/2007  Job:  [id number]

## 2010-09-21 ENCOUNTER — Encounter (HOSPITAL_BASED_OUTPATIENT_CLINIC_OR_DEPARTMENT_OTHER): Payer: Medicare Other | Admitting: Hematology & Oncology

## 2010-09-21 DIAGNOSIS — C9000 Multiple myeloma not having achieved remission: Secondary | ICD-10-CM

## 2010-09-21 DIAGNOSIS — E291 Testicular hypofunction: Secondary | ICD-10-CM

## 2010-10-12 ENCOUNTER — Encounter (HOSPITAL_BASED_OUTPATIENT_CLINIC_OR_DEPARTMENT_OTHER): Payer: Medicare Other | Admitting: Hematology & Oncology

## 2010-10-12 DIAGNOSIS — E291 Testicular hypofunction: Secondary | ICD-10-CM

## 2010-10-12 DIAGNOSIS — C9000 Multiple myeloma not having achieved remission: Secondary | ICD-10-CM

## 2010-12-14 ENCOUNTER — Encounter (HOSPITAL_BASED_OUTPATIENT_CLINIC_OR_DEPARTMENT_OTHER): Payer: Medicare Other | Admitting: Hematology & Oncology

## 2010-12-14 ENCOUNTER — Other Ambulatory Visit: Payer: Self-pay | Admitting: Hematology & Oncology

## 2010-12-14 DIAGNOSIS — E291 Testicular hypofunction: Secondary | ICD-10-CM

## 2010-12-14 DIAGNOSIS — C9000 Multiple myeloma not having achieved remission: Secondary | ICD-10-CM

## 2010-12-14 LAB — CBC WITH DIFFERENTIAL (CANCER CENTER ONLY)
BASO#: 0.1 10*3/uL (ref 0.0–0.2)
EOS%: 6.3 % (ref 0.0–7.0)
HGB: 12.3 g/dL — ABNORMAL LOW (ref 13.0–17.1)
LYMPH%: 34.5 % (ref 14.0–48.0)
MCH: 33.2 pg (ref 28.0–33.4)
MCHC: 36.4 g/dL — ABNORMAL HIGH (ref 32.0–35.9)
MONO%: 16.3 % — ABNORMAL HIGH (ref 0.0–13.0)
NEUT#: 2.6 10*3/uL (ref 1.5–6.5)

## 2010-12-14 LAB — COMPREHENSIVE METABOLIC PANEL
AST: 17 U/L (ref 0–37)
Albumin: 4.1 g/dL (ref 3.5–5.2)
Alkaline Phosphatase: 83 U/L (ref 39–117)
BUN: 45 mg/dL — ABNORMAL HIGH (ref 6–23)
Creatinine, Ser: 1.58 mg/dL — ABNORMAL HIGH (ref 0.50–1.35)
Potassium: 4 mEq/L (ref 3.5–5.3)
Total Bilirubin: 0.7 mg/dL (ref 0.3–1.2)

## 2010-12-14 LAB — LACTATE DEHYDROGENASE: LDH: 164 U/L (ref 94–250)

## 2010-12-22 ENCOUNTER — Emergency Department (HOSPITAL_COMMUNITY)
Admission: EM | Admit: 2010-12-22 | Discharge: 2010-12-22 | Disposition: A | Discharge status: Home / Self Care | Payer: Medicare Other | Attending: Emergency Medicine | Admitting: Emergency Medicine

## 2010-12-22 DIAGNOSIS — Z79899 Other long term (current) drug therapy: Secondary | ICD-10-CM | POA: Insufficient documentation

## 2010-12-22 DIAGNOSIS — R5383 Other fatigue: Secondary | ICD-10-CM | POA: Insufficient documentation

## 2010-12-22 DIAGNOSIS — Z8571 Personal history of Hodgkin lymphoma: Secondary | ICD-10-CM | POA: Insufficient documentation

## 2010-12-22 DIAGNOSIS — N390 Urinary tract infection, site not specified: Secondary | ICD-10-CM | POA: Insufficient documentation

## 2010-12-22 DIAGNOSIS — D649 Anemia, unspecified: Secondary | ICD-10-CM | POA: Insufficient documentation

## 2010-12-22 DIAGNOSIS — R5381 Other malaise: Secondary | ICD-10-CM | POA: Insufficient documentation

## 2010-12-22 DIAGNOSIS — I10 Essential (primary) hypertension: Secondary | ICD-10-CM | POA: Insufficient documentation

## 2010-12-22 DIAGNOSIS — E1169 Type 2 diabetes mellitus with other specified complication: Secondary | ICD-10-CM | POA: Insufficient documentation

## 2010-12-22 LAB — POCT I-STAT TROPONIN I
Troponin i, poc: 0.02 ng/mL (ref 0.00–0.08)
Troponin i, poc: 0.02 ng/mL (ref 0.00–0.08)

## 2010-12-22 LAB — DIFFERENTIAL
Basophils Relative: 0 % (ref 0–1)
Eosinophils Absolute: 0.1 10*3/uL (ref 0.0–0.7)
Monocytes Absolute: 1.4 10*3/uL — ABNORMAL HIGH (ref 0.1–1.0)
Monocytes Relative: 14 % — ABNORMAL HIGH (ref 3–12)

## 2010-12-22 LAB — CBC
MCH: 32.8 pg (ref 26.0–34.0)
Platelets: 241 10*3/uL (ref 150–400)
RBC: 3.63 MIL/uL — ABNORMAL LOW (ref 4.22–5.81)
RDW: 13.9 % (ref 11.5–15.5)
WBC: 10 10*3/uL (ref 4.0–10.5)

## 2010-12-22 LAB — URINALYSIS, ROUTINE W REFLEX MICROSCOPIC
Glucose, UA: NEGATIVE mg/dL
Protein, ur: NEGATIVE mg/dL
pH: 5 (ref 5.0–8.0)

## 2010-12-22 LAB — COMPREHENSIVE METABOLIC PANEL
ALT: 19 U/L (ref 0–53)
AST: 26 U/L (ref 0–37)
Albumin: 4 g/dL (ref 3.5–5.2)
CO2: 24 mEq/L (ref 19–32)
Calcium: 9.7 mg/dL (ref 8.4–10.5)
Chloride: 106 mEq/L (ref 96–112)
GFR calc non Af Amer: 43 mL/min — ABNORMAL LOW (ref >60)
Sodium: 141 mEq/L (ref 135–145)

## 2010-12-22 LAB — GLUCOSE, CAPILLARY
Glucose-Capillary: 142 mg/dL — ABNORMAL HIGH (ref 70–99)
Glucose-Capillary: 30 mg/dL — CL (ref 70–99)
Glucose-Capillary: 112 mg/dL — ABNORMAL HIGH (ref 70–99)
Glucose-Capillary: 162 mg/dL — ABNORMAL HIGH (ref 70–99)
Glucose-Capillary: 132 mg/dL — ABNORMAL HIGH (ref 70–99)

## 2010-12-22 LAB — URINE MICROSCOPIC-ADD ON

## 2010-12-23 LAB — URINE CULTURE
Colony Count: NO GROWTH
Culture: NO GROWTH

## 2010-12-24 ENCOUNTER — Emergency Department (HOSPITAL_COMMUNITY)
Admission: EM | Admit: 2010-12-24 | Discharge: 2010-12-24 | Disposition: A | Discharge status: Home / Self Care | Payer: Medicare Other | Attending: Emergency Medicine | Admitting: Emergency Medicine

## 2010-12-24 DIAGNOSIS — Z79899 Other long term (current) drug therapy: Secondary | ICD-10-CM | POA: Insufficient documentation

## 2010-12-24 DIAGNOSIS — R61 Generalized hyperhidrosis: Secondary | ICD-10-CM | POA: Insufficient documentation

## 2010-12-24 DIAGNOSIS — Z87898 Personal history of other specified conditions: Secondary | ICD-10-CM | POA: Insufficient documentation

## 2010-12-24 DIAGNOSIS — E1169 Type 2 diabetes mellitus with other specified complication: Secondary | ICD-10-CM | POA: Insufficient documentation

## 2010-12-24 DIAGNOSIS — I10 Essential (primary) hypertension: Secondary | ICD-10-CM | POA: Insufficient documentation

## 2010-12-24 LAB — BASIC METABOLIC PANEL
Chloride: 110 mEq/L (ref 96–112)
GFR calc Af Amer: >60 mL/min (ref >60)
GFR calc non Af Amer: 50 mL/min — ABNORMAL LOW (ref >60)
Glucose, Bld: 116 mg/dL — ABNORMAL HIGH (ref 70–99)
Potassium: 3.4 mEq/L — ABNORMAL LOW (ref 3.5–5.1)
Sodium: 142 mEq/L (ref 135–145)

## 2011-01-04 ENCOUNTER — Encounter (HOSPITAL_BASED_OUTPATIENT_CLINIC_OR_DEPARTMENT_OTHER): Payer: Medicare Other | Admitting: Hematology & Oncology

## 2011-01-04 DIAGNOSIS — C9000 Multiple myeloma not having achieved remission: Secondary | ICD-10-CM

## 2011-01-04 DIAGNOSIS — E291 Testicular hypofunction: Secondary | ICD-10-CM

## 2011-01-26 LAB — BONE MARROW EXAM

## 2011-01-26 LAB — DIFFERENTIAL
Basophils Absolute: 0
Eosinophils Absolute: 0.1
Lymphs Abs: 1.3
Monocytes Absolute: 0.1

## 2011-01-26 LAB — CBC
HCT: 28.9 — ABNORMAL LOW
MCV: 94.2
Platelets: 80 — ABNORMAL LOW
RDW: 13.3

## 2011-01-27 LAB — URINALYSIS, ROUTINE W REFLEX MICROSCOPIC
Bilirubin Urine: NEGATIVE
Glucose, UA: NEGATIVE
Hgb urine dipstick: NEGATIVE
Specific Gravity, Urine: 1.019
pH: 5

## 2011-01-27 LAB — DIFFERENTIAL
Basophils Relative: 0
Eosinophils Absolute: 0.1
Lymphs Abs: 0.7
Neutrophils Relative %: 61

## 2011-01-27 LAB — CBC
HCT: 31.5 — ABNORMAL LOW
Hemoglobin: 10.7 — ABNORMAL LOW
MCHC: 33.9
MCV: 94.8
RBC: 3.32 — ABNORMAL LOW

## 2011-01-27 LAB — COMPREHENSIVE METABOLIC PANEL
ALT: 11
CO2: 25
Calcium: 9.4
GFR calc non Af Amer: 38 — ABNORMAL LOW
Glucose, Bld: 148 — ABNORMAL HIGH
Sodium: 143

## 2011-01-27 LAB — PROTIME-INR
INR: 1.1
Prothrombin Time: 14.7

## 2011-01-27 LAB — ABO/RH: ABO/RH(D): AB POS

## 2011-01-27 LAB — PREPARE PLATELET PHERESIS

## 2011-01-27 LAB — TYPE AND SCREEN: Antibody Screen: NEGATIVE

## 2011-01-27 LAB — URINE MICROSCOPIC-ADD ON

## 2011-01-30 LAB — CBC
HCT: 26.6 — ABNORMAL LOW
HCT: 28.2 — ABNORMAL LOW
HCT: 28.6 — ABNORMAL LOW
HCT: 30.2 — ABNORMAL LOW
Hemoglobin: 10.2 — ABNORMAL LOW
Hemoglobin: 10.6 — ABNORMAL LOW
Hemoglobin: 10.9 — ABNORMAL LOW
Hemoglobin: 8.9 — ABNORMAL LOW
Hemoglobin: 9.3 — ABNORMAL LOW
Hemoglobin: 9.5 — ABNORMAL LOW
Hemoglobin: 9.6 — ABNORMAL LOW
Hemoglobin: 9.6 — ABNORMAL LOW
Hemoglobin: 9.6 — ABNORMAL LOW
MCHC: 33.3
MCHC: 33.6
MCHC: 33.7
MCHC: 33.7
MCHC: 33.8
MCHC: 34.1
MCHC: 34.7
MCV: 93.7
MCV: 94.9
MCV: 95.2
MCV: 95.3
MCV: 95.7
MCV: 96.1
MCV: 96.8
Platelets: 136 — ABNORMAL LOW
Platelets: 168
Platelets: 640 — ABNORMAL HIGH
Platelets: 644 — ABNORMAL HIGH
RBC: 2.77 — ABNORMAL LOW
RBC: 2.96 — ABNORMAL LOW
RBC: 3.12 — ABNORMAL LOW
RBC: 3.26 — ABNORMAL LOW
RBC: 3.29 — ABNORMAL LOW
RBC: 3.4 — ABNORMAL LOW
RDW: 13.3
RDW: 13.4
RDW: 13.5
RDW: 13.7
RDW: 14
RDW: 14
RDW: 14.1
WBC: 12.5 — ABNORMAL HIGH
WBC: 14.3 — ABNORMAL HIGH
WBC: 15.6 — ABNORMAL HIGH
WBC: 17.5 — ABNORMAL HIGH
WBC: 18.7 — ABNORMAL HIGH
WBC: 3.3 — ABNORMAL LOW
WBC: 5.8

## 2011-01-30 LAB — GLUCOSE, CAPILLARY
Glucose-Capillary: 101 — ABNORMAL HIGH
Glucose-Capillary: 104 — ABNORMAL HIGH
Glucose-Capillary: 106 — ABNORMAL HIGH
Glucose-Capillary: 109 — ABNORMAL HIGH
Glucose-Capillary: 110 — ABNORMAL HIGH
Glucose-Capillary: 119 — ABNORMAL HIGH
Glucose-Capillary: 121 — ABNORMAL HIGH
Glucose-Capillary: 122 — ABNORMAL HIGH
Glucose-Capillary: 125 — ABNORMAL HIGH
Glucose-Capillary: 127 — ABNORMAL HIGH
Glucose-Capillary: 129 — ABNORMAL HIGH
Glucose-Capillary: 129 — ABNORMAL HIGH
Glucose-Capillary: 135 — ABNORMAL HIGH
Glucose-Capillary: 140 — ABNORMAL HIGH
Glucose-Capillary: 140 — ABNORMAL HIGH
Glucose-Capillary: 144 — ABNORMAL HIGH
Glucose-Capillary: 148 — ABNORMAL HIGH
Glucose-Capillary: 149 — ABNORMAL HIGH
Glucose-Capillary: 149 — ABNORMAL HIGH
Glucose-Capillary: 155 — ABNORMAL HIGH
Glucose-Capillary: 157 — ABNORMAL HIGH
Glucose-Capillary: 159 — ABNORMAL HIGH
Glucose-Capillary: 160 — ABNORMAL HIGH
Glucose-Capillary: 161 — ABNORMAL HIGH
Glucose-Capillary: 163 — ABNORMAL HIGH
Glucose-Capillary: 164 — ABNORMAL HIGH
Glucose-Capillary: 175 — ABNORMAL HIGH
Glucose-Capillary: 177 — ABNORMAL HIGH
Glucose-Capillary: 184 — ABNORMAL HIGH
Glucose-Capillary: 191 — ABNORMAL HIGH
Glucose-Capillary: 192 — ABNORMAL HIGH
Glucose-Capillary: 197 — ABNORMAL HIGH
Glucose-Capillary: 246 — ABNORMAL HIGH
Glucose-Capillary: 249 — ABNORMAL HIGH
Glucose-Capillary: 250 — ABNORMAL HIGH
Glucose-Capillary: 259 — ABNORMAL HIGH
Glucose-Capillary: 270 — ABNORMAL HIGH
Glucose-Capillary: 83
Glucose-Capillary: 92
Glucose-Capillary: 92
Glucose-Capillary: 93
Glucose-Capillary: 95
Glucose-Capillary: 96

## 2011-01-30 LAB — CROSSMATCH
ABO/RH(D): AB POS
Antibody Screen: NEGATIVE

## 2011-01-30 LAB — PROTIME-INR
INR: 1.1
INR: 1.2
INR: 1.2
Prothrombin Time: 15.8 — ABNORMAL HIGH
Prothrombin Time: 16.7 — ABNORMAL HIGH

## 2011-01-30 LAB — COMPREHENSIVE METABOLIC PANEL
ALT: 16
AST: 18
Alkaline Phosphatase: 74
Alkaline Phosphatase: 89
BUN: 29 — ABNORMAL HIGH
CO2: 27
Chloride: 104
Creatinine, Ser: 1.84 — ABNORMAL HIGH
GFR calc non Af Amer: 36 — ABNORMAL LOW
Glucose, Bld: 139 — ABNORMAL HIGH
Glucose, Bld: 184 — ABNORMAL HIGH
Potassium: 2.8 — ABNORMAL LOW
Potassium: 5.9 — ABNORMAL HIGH
Sodium: 142
Total Bilirubin: 0.5

## 2011-01-30 LAB — BASIC METABOLIC PANEL
BUN: 23
BUN: 23
BUN: 27 — ABNORMAL HIGH
BUN: 30 — ABNORMAL HIGH
CO2: 22
CO2: 23
CO2: 23
CO2: 27
CO2: 27
Calcium: 7.7 — ABNORMAL LOW
Calcium: 8 — ABNORMAL LOW
Calcium: 8.4
Calcium: 8.4
Calcium: 8.5
Calcium: 8.9
Chloride: 108
Chloride: 109
Chloride: 110
Chloride: 111
Chloride: 112
Chloride: 114 — ABNORMAL HIGH
Chloride: 115 — ABNORMAL HIGH
Creatinine, Ser: 1.42
Creatinine, Ser: 1.57 — ABNORMAL HIGH
Creatinine, Ser: 1.59 — ABNORMAL HIGH
Creatinine, Ser: 1.62 — ABNORMAL HIGH
Creatinine, Ser: 1.7 — ABNORMAL HIGH
Creatinine, Ser: 1.86 — ABNORMAL HIGH
GFR calc Af Amer: 44 — ABNORMAL LOW
GFR calc Af Amer: 51 — ABNORMAL LOW
GFR calc Af Amer: 52 — ABNORMAL LOW
GFR calc Af Amer: 53 — ABNORMAL LOW
GFR calc non Af Amer: 43 — ABNORMAL LOW
GFR calc non Af Amer: 43 — ABNORMAL LOW
Glucose, Bld: 107 — ABNORMAL HIGH
Glucose, Bld: 136 — ABNORMAL HIGH
Glucose, Bld: 148 — ABNORMAL HIGH
Potassium: 4.8
Potassium: 5.1
Sodium: 138
Sodium: 138
Sodium: 143
Sodium: 146 — ABNORMAL HIGH

## 2011-01-30 LAB — MAGNESIUM: Magnesium: 1.5

## 2011-01-30 LAB — CULTURE, BLOOD (ROUTINE X 2)

## 2011-01-30 LAB — TYPE AND SCREEN

## 2011-01-30 LAB — DIFFERENTIAL
Basophils Relative: 0
Eosinophils Absolute: 0.1
Eosinophils Relative: 4
Monocytes Relative: 6
Neutrophils Relative %: 53

## 2011-01-30 LAB — HEMATOCRIT, BODY FLUID

## 2011-01-30 LAB — URINALYSIS, ROUTINE W REFLEX MICROSCOPIC
Glucose, UA: NEGATIVE
Hgb urine dipstick: NEGATIVE
Specific Gravity, Urine: 1.017
pH: 5.5

## 2011-01-30 LAB — TSH: TSH: 12.666 — ABNORMAL HIGH

## 2011-01-30 LAB — PREPARE PLATELET PHERESIS

## 2011-01-30 LAB — AMYLASE, BODY FLUID: Amylase, Fluid: 10993

## 2011-01-30 LAB — T4, FREE: Free T4: 0.89

## 2011-01-30 LAB — PREALBUMIN: Prealbumin: 7.3 — ABNORMAL LOW

## 2011-01-30 LAB — POCT I-STAT 4, (NA,K, GLUC, HGB,HCT): Glucose, Bld: 105 — ABNORMAL HIGH

## 2011-01-30 LAB — HEMOGLOBIN A1C
Hgb A1c MFr Bld: 6
Mean Plasma Glucose: 126

## 2011-02-01 ENCOUNTER — Encounter (HOSPITAL_BASED_OUTPATIENT_CLINIC_OR_DEPARTMENT_OTHER): Payer: Medicare Other | Admitting: Hematology & Oncology

## 2011-02-01 DIAGNOSIS — E291 Testicular hypofunction: Secondary | ICD-10-CM

## 2011-02-01 DIAGNOSIS — C9000 Multiple myeloma not having achieved remission: Secondary | ICD-10-CM

## 2011-02-07 ENCOUNTER — Encounter: Payer: Self-pay | Admitting: *Deleted

## 2011-02-07 ENCOUNTER — Encounter: Payer: Medicare Other | Attending: Specialist | Admitting: *Deleted

## 2011-02-07 VITALS — Ht 67.0 in | Wt 186.6 lb

## 2011-02-07 DIAGNOSIS — I10 Essential (primary) hypertension: Secondary | ICD-10-CM

## 2011-02-07 DIAGNOSIS — Z713 Dietary counseling and surveillance: Secondary | ICD-10-CM | POA: Insufficient documentation

## 2011-02-07 DIAGNOSIS — E119 Type 2 diabetes mellitus without complications: Secondary | ICD-10-CM | POA: Insufficient documentation

## 2011-02-07 NOTE — Progress Notes (Signed)
  Medical Nutrition Therapy:  Appt start time: 0900 end time:  1030.  Assessment:  Primary concerns today: T2DM. Pt with h/o T2DM for here with wife for MNT. Reports two recent hospital visits (11/2010) with low BGs (20-30 mg/dl). 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 Over last 2 weeks, FBG avg = 117 mg/dl (range: 40-981 mg/dl). Reports reading food labels and counting carbs.  Usual eating pattern includes 2-3 meals and 1-2 snacks per day.  Pt reports breakfast today of 2 pcs whole wheat toast with sugar-free jelly. Eats excessive amounts of CHO at meals (2.5 c cereal w/ 1 whole milk); excessive sodium noted in meals at [employer]. Some meal skipping suspected.  Pt denies any pain at this time.  Medications: See medication list; reviewed with patient.   Usual physical activity: None noted.   Estimated energy needs: 1500 calories 165-170 g carbohydrates 95 g protein 50 g fat  Progress Towards Goal(s):  NEW.   Nutritional Diagnosis:  [place]-2.1 Impaired nutrient utilization related to excessive CHO intake and sedentary lifestyle as evidenced by food history and patient report of no structured exercise.    Intervention/Goals:  Follow Diabetes Meal Plan as instructed (yellow card)  Eat 3 meals and 2 snacks; or every 3-5 hrs - Avoid meal skipping.  Limit carbohydrate intake to 45 grams/meal and 15 grams/snack  Add lean protein foods to all meals/snacks  Aim to limit sodium to <2000 mg (decrease meals out) and increase fiber to 30 grams daily  Monitor glucose levels as instructed by your doctor  Aim for 20-30 mins of physical activity daily  Handouts given during visit include:  Living Well with DM - [employer]  CHO/Pro snack list  45gm CHO 7-day menu  [employer] Guide  Monitoring/Evaluation:  Dietary intake, exercise, A1c, BG trends, and body weight in 4 week(s).

## 2011-02-07 NOTE — Patient Instructions (Addendum)
Goals:  Follow Diabetes Meal Plan as instructed (yellow card)  Eat 3 meals and 2 snacks; or every 3-5 hrs - Avoid meal skipping.  Limit carbohydrate intake to 45 grams/meal and 15 grams/snack  Add lean protein foods to all meals/snacks  Aim to limit sodium to <2000 mg (decrease meals out) and increase fiber to 30 grams daily  Monitor glucose levels as instructed by your doctor  Aim for 20-30 mins of physical activity daily

## 2011-03-01 ENCOUNTER — Encounter (HOSPITAL_BASED_OUTPATIENT_CLINIC_OR_DEPARTMENT_OTHER): Payer: Medicare Other | Admitting: Hematology & Oncology

## 2011-03-01 DIAGNOSIS — E291 Testicular hypofunction: Secondary | ICD-10-CM

## 2011-03-01 DIAGNOSIS — C9000 Multiple myeloma not having achieved remission: Secondary | ICD-10-CM

## 2011-03-07 ENCOUNTER — Ambulatory Visit: Payer: Medicare Other | Admitting: *Deleted

## 2011-03-13 ENCOUNTER — Encounter: Payer: Medicare Other | Attending: Specialist | Admitting: *Deleted

## 2011-03-13 ENCOUNTER — Encounter: Payer: Self-pay | Admitting: *Deleted

## 2011-03-13 DIAGNOSIS — Z713 Dietary counseling and surveillance: Secondary | ICD-10-CM | POA: Insufficient documentation

## 2011-03-13 DIAGNOSIS — I10 Essential (primary) hypertension: Secondary | ICD-10-CM

## 2011-03-13 DIAGNOSIS — E119 Type 2 diabetes mellitus without complications: Secondary | ICD-10-CM | POA: Insufficient documentation

## 2011-03-13 NOTE — Patient Instructions (Addendum)
Goals:  Continue to follow Diabetes Meal Plan as instructed (yellow card)  Eat 3 meals and 2 snacks; or every 3-5 hrs - Avoid meal skipping.  Aim to limit sodium to <2000 mg (decrease meals out) and increase fiber to 30 grams daily  Aim for 20-30 mins of physical activity daily

## 2011-03-13 NOTE — Progress Notes (Addendum)
Medical Nutrition Therapy:  Appt start time: 0830 end time:  0900.  Primary concerns today: T2DM; follow up. Pt here with wife for follow up. Reports 2 meals/day and FBGs of 108-129 mg/dL and 2hPP of 960-454 mg/dL. Sometimes eats pizza and fried fish, but states "not often"; wife states they "cheat".  Reports MD "is ok with this as long as it comes down the next day". Discussed the effects of meal skipping and high fat/CHO foods on BG, but pt states "sometimes I eat breakfast late so I'm not hungry at lunch".  Gave pt [employer]'s Verio IQ to try. Will contact MD for strips prescription if desired.  Pt denies any pain at this time.  Medications: See medication list; reviewed with patient.   Usual physical activity: None noted.   Estimated energy needs: 1500 calories 165-170 g carbohydrates 95 g protein 50 g fat  Progress Towards Goal(s):  Some progress noted; Continue.   Nutritional Diagnosis:  [place]-2.1 Impaired nutrient utilization related to excessive CHO intake and sedentary lifestyle as evidenced by food history and patient report of no structured exercise.    Intervention/Goals:  Continue to follow Diabetes Meal Plan as instructed (yellow card)  Eat 3 meals and 2 snacks; or every 3-5 hrs - Avoid meal skipping.  Aim to limit sodium to <2000 mg (decrease meals out) and increase fiber to 30 grams daily  Aim for 20-30 mins of physical activity daily  Handouts/Supplies distributed during visit include:  Verio IQ starter kit (1 ea) S/N:: [id number] Exp: 06/2012 Lot: [id number] [place] [employer] IQ strips (2 boxes - 10 strips ea)  Lot: [id number]  Exp: 06/2012  Monitoring/Evaluation:  Dietary intake, exercise, A1c, BG trends, and body weight in 4 week(s).    Chart purged 03/13/11. SH

## 2011-03-29 ENCOUNTER — Other Ambulatory Visit: Payer: Self-pay | Admitting: Hematology & Oncology

## 2011-03-29 ENCOUNTER — Ambulatory Visit (HOSPITAL_BASED_OUTPATIENT_CLINIC_OR_DEPARTMENT_OTHER): Payer: Medicare Other | Admitting: Hematology & Oncology

## 2011-03-29 ENCOUNTER — Encounter: Payer: Self-pay | Admitting: Hematology & Oncology

## 2011-03-29 ENCOUNTER — Ambulatory Visit (HOSPITAL_BASED_OUTPATIENT_CLINIC_OR_DEPARTMENT_OTHER): Payer: Medicare Other | Admitting: Lab

## 2011-03-29 VITALS — BP 119/53 | HR 56 | Temp 97.1°F | Ht 67.0 in | Wt 183.0 lb

## 2011-03-29 DIAGNOSIS — E349 Endocrine disorder, unspecified: Secondary | ICD-10-CM

## 2011-03-29 DIAGNOSIS — E559 Vitamin D deficiency, unspecified: Secondary | ICD-10-CM

## 2011-03-29 DIAGNOSIS — C8307 Small cell B-cell lymphoma, spleen: Secondary | ICD-10-CM

## 2011-03-29 DIAGNOSIS — C9000 Multiple myeloma not having achieved remission: Secondary | ICD-10-CM

## 2011-03-29 DIAGNOSIS — E291 Testicular hypofunction: Secondary | ICD-10-CM

## 2011-03-29 HISTORY — DX: Small cell b-cell lymphoma, spleen: C83.07

## 2011-03-29 HISTORY — DX: Endocrine disorder, unspecified: E34.9

## 2011-03-29 LAB — CBC WITH DIFFERENTIAL (CANCER CENTER ONLY)
BASO#: 0.1 10*3/uL (ref 0.0–0.2)
Eosinophils Absolute: 0.6 10*3/uL — ABNORMAL HIGH (ref 0.0–0.5)
HGB: 11.9 g/dL — ABNORMAL LOW (ref 13.0–17.1)
LYMPH%: 19.9 % (ref 14.0–48.0)
MCH: 34.7 pg — ABNORMAL HIGH (ref 28.0–33.4)
MCV: 97 fL (ref 82–98)
MONO#: 1.2 10*3/uL — ABNORMAL HIGH (ref 0.1–0.9)
MONO%: 15.7 % — ABNORMAL HIGH (ref 0.0–13.0)
NEUT#: 4.4 10*3/uL (ref 1.5–6.5)
RBC: 3.43 10*6/uL — ABNORMAL LOW (ref 4.20–5.70)

## 2011-03-29 MED ORDER — TESTOSTERONE CYPIONATE 200 MG/ML IM SOLN
200.0000 mg | INTRAMUSCULAR | Status: DC
  Administered 2011-03-29: 200 mg via INTRAMUSCULAR

## 2011-03-29 NOTE — Progress Notes (Signed)
This office note has been dictated.

## 2011-03-30 LAB — COMPREHENSIVE METABOLIC PANEL
ALT: 19 U/L (ref 0–53)
AST: 23 U/L (ref 0–37)
Alkaline Phosphatase: 106 U/L (ref 39–117)
CO2: 21 mEq/L (ref 19–32)
Sodium: 139 mEq/L (ref 135–145)
Total Bilirubin: 1 mg/dL (ref 0.3–1.2)
Total Protein: 7.3 g/dL (ref 6.0–8.3)

## 2011-03-30 LAB — RETICULOCYTES (CHCC)
ABS Retic: 31.8 K/uL (ref 19.0–186.0)
RBC.: 3.53 MIL/uL — ABNORMAL LOW (ref 4.22–5.81)
Retic Ct Pct: 0.9 % (ref 0.4–2.3)

## 2011-03-30 LAB — VITAMIN D 25 HYDROXY (VIT D DEFICIENCY, FRACTURES): Vit D, 25-Hydroxy: 29 ng/mL — ABNORMAL LOW (ref 30–89)

## 2011-03-30 LAB — LACTATE DEHYDROGENASE: LDH: 162 U/L (ref 94–250)

## 2011-03-30 NOTE — Progress Notes (Signed)
CC:   [name], MD [name]. Caryn Section, M.D.  DIAGNOSES: 1. Hypotestosteronemia. 2. History of splenic marginal zone lymphoma.  CURRENT THERAPY:  Testosterone q.4 weeks - 20 mg IM.  INTERVAL HISTORY:  Mr. Wenger comes in for followup.  We last saw him back in August.  He is doing okay.  He had a good Thanksgiving.  His wife's at home right now.  She is doing okay.  He has done well with the testosterone. He says it does make him feel a little better.  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  He is followed by Nephrology for this.  He also has diabetes.  He says he is now off his diabetes medication as his blood sugars are better.  He has not noted any fever.  He has had no bony pain.  He has had no arthralgias or myalgias.  PHYSICAL EXAM:  General: This is a well-developed, well-nourished, white gentleman in no obvious distress.  Vital signs: 97.1, pulse 56, respiratory rate 18, and blood pressure 119/53.  Weight is 183.  Head and neck exam: Shows a normocephalic, atraumatic skull.  There are no ocular or oral lesions.  There are no palpable cervical or supraclavicular lymph nodes.  Lungs are clear bilaterally.  Cardiac exam: Regular rate and rhythm with a normal S1 and S2.  There are no murmurs, rubs, or bruits.  Abdominal exam: Soft with good bowel sounds. He has a well-healed laparotomy scar from his splenectomy.  There is no guarding or rebound tenderness.  There is no palpable hepatomegaly. Back exam: Shows no tenderness over the spine, ribs, or hips. Extremities: Show some trace edema in his legs bilaterally.  Skin exam: No rashes, ecchymosis, or petechia.  OTHER LABORATORY STUDIES:  White count 7.8, hemoglobin 12, hematocrit 33.8, and platelet count 203.  IMPRESSION:  [name] is a 70 year old gentleman with history of marginal zone lymphoma.  He presented with splenomegaly.  He did undergo a splenectomy.  This was back in October 2009.  He did receive  adjuvant Rituxan.  He completed Rituxan back in April 2010.  I do not see any evidence of lymphoma recurrence.  I do not suspect that this will be an issue for him.  I am glad to see that his kidneys are doing okay.  I am glad to see that his diabetes is doing better, now that he is off his hypoglycemic agents.  Will continue him on the testosterone.  This does make a difference is in his quality of life.  I will plan to get him back to see me in another 3-4 months.  He will come back monthly for his testosterone injection.    ______________________________ Josph Macho, M.D. PRE/MEDQ  D:  03/29/2011  T:  03/30/2011  Job:  [id number]

## 2011-04-21 ENCOUNTER — Ambulatory Visit: Payer: Medicare Other

## 2011-05-05 DIAGNOSIS — C8589 Other specified types of non-Hodgkin lymphoma, extranodal and solid organ sites: Secondary | ICD-10-CM | POA: Diagnosis not present

## 2011-05-05 DIAGNOSIS — E162 Hypoglycemia, unspecified: Secondary | ICD-10-CM | POA: Diagnosis not present

## 2011-05-05 DIAGNOSIS — E119 Type 2 diabetes mellitus without complications: Secondary | ICD-10-CM | POA: Diagnosis not present

## 2011-05-08 DIAGNOSIS — I10 Essential (primary) hypertension: Secondary | ICD-10-CM | POA: Diagnosis not present

## 2011-05-08 DIAGNOSIS — E785 Hyperlipidemia, unspecified: Secondary | ICD-10-CM | POA: Diagnosis not present

## 2011-05-08 DIAGNOSIS — Z79899 Other long term (current) drug therapy: Secondary | ICD-10-CM | POA: Diagnosis not present

## 2011-05-08 DIAGNOSIS — E119 Type 2 diabetes mellitus without complications: Secondary | ICD-10-CM | POA: Diagnosis not present

## 2011-05-15 ENCOUNTER — Encounter: Payer: Medicare Other | Attending: Specialist | Admitting: *Deleted

## 2011-05-15 ENCOUNTER — Encounter: Payer: Self-pay | Admitting: *Deleted

## 2011-05-15 DIAGNOSIS — Z713 Dietary counseling and surveillance: Secondary | ICD-10-CM | POA: Insufficient documentation

## 2011-05-15 DIAGNOSIS — E119 Type 2 diabetes mellitus without complications: Secondary | ICD-10-CM | POA: Diagnosis not present

## 2011-05-15 NOTE — Progress Notes (Signed)
Medical Nutrition Therapy:  Appt start time: 0830 end time:  0900.  Primary concerns today: T2DM; follow up. Pt here for f/u.  Reports episode of hypoglycemia on 05/03/11; didn't eat breakfast and highly emotional day (put cat down). Overtreated with intake of brownie, soda, and glucose tabs - at home later, sugar was 330 mg/dL., but decreased later in day.  A1c checked a few days ago; pt unsure of result, but thinks it was 6.0%. Reports normal BG last night after eating lasagna and several pcs of cake at church function. Reports 4 pcs pepperoni pizza for dinner recently; BG 2 hrs PP was 259 and FBG next day of 124. Drinking unsweetened tea and diet green tea; still no sodas. Still skipping lunch most days. Sees nephrologist every 3 mos and PCP every month.    FBG: 127, 144, 133, 113, 124, 140, 123 mg/dL  Medications: See medication list; reviewed with patient; no changes   Usual physical activity:  Reports he walks a lot. No structured exercise noted.    Estimated energy needs: 1500 calories 165-170 g carbohydrates 95 g protein 50 g fat  Progress Towards Goal(s):  Some progress noted; Continue.   Nutritional Diagnosis:  [place]-2.1 Impaired nutrient utilization related to excessive CHO intake and sedentary lifestyle as evidenced by food history and patient report of no structured exercise.    Intervention/Goals:  Continue to follow Diabetes Meal Plan as instructed (yellow card)  Eat 3 meals and 2 snacks; or every 3-5 hrs - Avoid meal skipping.  Aim for 20-30 mins of physical activity daily  Monitoring/Evaluation:  Dietary intake, exercise, A1c (as available), BG trends, and body weight in 3 month(s).

## 2011-05-15 NOTE — Patient Instructions (Signed)
Goals:  Continue to follow Diabetes Meal Plan as instructed (yellow card)  Eat 3 meals and 2 snacks; or every 3-5 hrs - Avoid meal skipping.  Aim for 20-30 mins of physical activity daily

## 2011-05-18 ENCOUNTER — Telehealth: Payer: Self-pay | Admitting: Hematology & Oncology

## 2011-05-18 NOTE — Telephone Encounter (Signed)
Pt called and cx 05/19/11 apt and resch for 05/22/11

## 2011-05-19 ENCOUNTER — Ambulatory Visit: Payer: Medicare Other

## 2011-05-22 ENCOUNTER — Ambulatory Visit (HOSPITAL_BASED_OUTPATIENT_CLINIC_OR_DEPARTMENT_OTHER): Payer: Medicare Other

## 2011-05-22 VITALS — BP 135/61 | HR 62 | Temp 97.1°F | Ht 67.0 in | Wt 178.5 lb

## 2011-05-22 DIAGNOSIS — E349 Endocrine disorder, unspecified: Secondary | ICD-10-CM

## 2011-05-22 DIAGNOSIS — E291 Testicular hypofunction: Secondary | ICD-10-CM

## 2011-05-22 MED ORDER — TESTOSTERONE CYPIONATE 200 MG/ML IM SOLN
200.0000 mg | INTRAMUSCULAR | Status: DC
  Administered 2011-05-22: 200 mg via INTRAMUSCULAR

## 2011-05-22 NOTE — Progress Notes (Signed)
Addended by: Lacie Draft on: 05/22/2011 10:02 AM   Modules accepted: Orders

## 2011-06-12 DIAGNOSIS — I10 Essential (primary) hypertension: Secondary | ICD-10-CM | POA: Diagnosis not present

## 2011-06-12 DIAGNOSIS — E119 Type 2 diabetes mellitus without complications: Secondary | ICD-10-CM | POA: Diagnosis not present

## 2011-06-12 DIAGNOSIS — C8589 Other specified types of non-Hodgkin lymphoma, extranodal and solid organ sites: Secondary | ICD-10-CM | POA: Diagnosis not present

## 2011-06-16 ENCOUNTER — Ambulatory Visit (HOSPITAL_BASED_OUTPATIENT_CLINIC_OR_DEPARTMENT_OTHER): Payer: Medicare Other

## 2011-06-16 VITALS — BP 137/74 | HR 61 | Temp 97.1°F

## 2011-06-16 DIAGNOSIS — E349 Endocrine disorder, unspecified: Secondary | ICD-10-CM

## 2011-06-16 DIAGNOSIS — E291 Testicular hypofunction: Secondary | ICD-10-CM | POA: Diagnosis not present

## 2011-06-16 MED ORDER — TESTOSTERONE CYPIONATE 200 MG/ML IM SOLN
200.0000 mg | INTRAMUSCULAR | Status: DC
  Administered 2011-06-16: 200 mg via INTRAMUSCULAR

## 2011-06-21 DIAGNOSIS — M109 Gout, unspecified: Secondary | ICD-10-CM | POA: Diagnosis not present

## 2011-06-28 DIAGNOSIS — I129 Hypertensive chronic kidney disease with stage 1 through stage 4 chronic kidney disease, or unspecified chronic kidney disease: Secondary | ICD-10-CM | POA: Diagnosis not present

## 2011-06-28 DIAGNOSIS — M109 Gout, unspecified: Secondary | ICD-10-CM | POA: Diagnosis not present

## 2011-07-11 ENCOUNTER — Encounter: Payer: Self-pay | Admitting: *Deleted

## 2011-07-11 NOTE — Progress Notes (Addendum)
Pt here with wife for her T2DM assessment visit on 07/10/11. Reports he has started Januvia 50 mg/day on 06/13/11 by Dr. [name].

## 2011-07-11 NOTE — Progress Notes (Deleted)
Subjective:     Patient ID: Larry [name], male   DOB: 02-14-1941, 72 y.o.   MRN: [id number]  HPI   Review of Systems     Objective:   Physical Exam     Assessment:     ***    Plan:     ***

## 2011-07-14 ENCOUNTER — Ambulatory Visit (HOSPITAL_BASED_OUTPATIENT_CLINIC_OR_DEPARTMENT_OTHER): Payer: Medicare Other

## 2011-07-14 ENCOUNTER — Other Ambulatory Visit (HOSPITAL_BASED_OUTPATIENT_CLINIC_OR_DEPARTMENT_OTHER): Payer: Medicare Other | Admitting: Lab

## 2011-07-14 ENCOUNTER — Ambulatory Visit (HOSPITAL_BASED_OUTPATIENT_CLINIC_OR_DEPARTMENT_OTHER): Payer: Medicare Other | Admitting: Hematology & Oncology

## 2011-07-14 VITALS — BP 135/65 | HR 64 | Temp 96.7°F | Ht 67.0 in | Wt 181.0 lb

## 2011-07-14 DIAGNOSIS — E291 Testicular hypofunction: Secondary | ICD-10-CM

## 2011-07-14 DIAGNOSIS — D649 Anemia, unspecified: Secondary | ICD-10-CM | POA: Diagnosis not present

## 2011-07-14 DIAGNOSIS — E349 Endocrine disorder, unspecified: Secondary | ICD-10-CM

## 2011-07-14 DIAGNOSIS — C8307 Small cell B-cell lymphoma, spleen: Secondary | ICD-10-CM

## 2011-07-14 DIAGNOSIS — N289 Disorder of kidney and ureter, unspecified: Secondary | ICD-10-CM

## 2011-07-14 DIAGNOSIS — C8337 Diffuse large B-cell lymphoma, spleen: Secondary | ICD-10-CM | POA: Diagnosis not present

## 2011-07-14 LAB — CBC WITH DIFFERENTIAL (CANCER CENTER ONLY)
BASO%: 1.2 % (ref 0.0–2.0)
EOS%: 17.6 % — ABNORMAL HIGH (ref 0.0–7.0)
HCT: 33.7 % — ABNORMAL LOW (ref 38.7–49.9)
LYMPH#: 2.1 10*3/uL (ref 0.9–3.3)
MCHC: 34.7 g/dL (ref 32.0–35.9)
MONO#: 1.5 10*3/uL — ABNORMAL HIGH (ref 0.1–0.9)
NEUT#: 3.4 10*3/uL (ref 1.5–6.5)
NEUT%: 39.4 % — ABNORMAL LOW (ref 40.0–80.0)
Platelets: 244 10*3/uL (ref 145–400)
RDW: 14.5 % (ref 11.1–15.7)
WBC: 8.5 10*3/uL (ref 4.0–10.0)

## 2011-07-14 LAB — CHCC SATELLITE - SMEAR

## 2011-07-14 MED ORDER — TESTOSTERONE CYPIONATE 200 MG/ML IM SOLN
200.0000 mg | INTRAMUSCULAR | Status: DC
  Administered 2011-07-14: 200 mg via INTRAMUSCULAR

## 2011-07-14 NOTE — Progress Notes (Signed)
This office note has been dictated.

## 2011-07-14 NOTE — Progress Notes (Signed)
CC:   Larry Liner, MD [name]. Caryn Section, M.D.  DIAGNOSES: 1. Splenic marginal zone lymphoma, remission. 2. Hypotestosteronemia. 3. Anemia secondary to renal insufficiency.  CURRENT THERAPY:  Testosterone 200 mg IM q.4 weeks.  INTERVAL HISTORY:  Larry [name] comes in for his followup.  He is doing well.  He says that his kidney function is doing better.  He said that Dr. Caryn Section of [facility] is happy with how his kidneys are doing.  He has had no problems with the testosterone.  This really has helped his quality of life.  When we last saw Larry [name], his renal function showed a BUN of 48 and a creatinine of 1.65.  He has not noted any abdominal pain.  His appetite has been quite good. He has had no cough.  He has chronic leg swelling.  PHYSICAL EXAMINATION:  General Appearance:  This is an elderly- appearing, white gentleman in no obvious distress.  Vital Signs:  96.7, pulse 64, respiratory rate 16, blood pressure 135/65.  Weight is 181. Head and Neck Exam:  A normocephalic, atraumatic skull.  There are no ocular or oral lesions.  No palpable cervical or supraclavicular lymph nodes.  Lungs:  Clear bilaterally.  Cardiac Exam:  Regular rate and rhythm with a normal S1 and S2.  There are no murmurs, rubs, or bruits. Abdominal Exam:  Soft with good bowel sounds.  There is no palpable abdominal mass.  There is no fluid wave.  There is no palpable hepatosplenomegaly.  He has a well-healed laparotomy scar from his splenectomy.  Extremities:  Chronic 2+ edema in his legs.  Skin Exam: There are some actinic keratoses.  LABORATORY STUDIES:  White cell count is 8.5, hemoglobin 11.7, hematocrit 33.7, platelet count 244.  MCV is 98.  IMPRESSION:  Larry [name] is a 71 year old, white gentleman with a history of splenic marginal zone lymphoma.  He underwent a splenectomy back in October 2009.  We did give him a course of Rituxan, which was completed back in April  2010.  He is a little anemic.  Again, I believe this is more from his renal insufficiency.  He is doing well with his testosterone.  This is helping in his quality of life.  We will go ahead and plan to get him back to see me in another 3 months.  He will come back monthly for his testosterone injections.    ______________________________ Larry [name], M.D. PRE/MEDQ  D:  07/14/2011  T:  07/14/2011  Job:  [id number]

## 2011-07-18 LAB — COMPREHENSIVE METABOLIC PANEL
Albumin: 4.3 g/dL (ref 3.5–5.2)
Alkaline Phosphatase: 125 U/L — ABNORMAL HIGH (ref 39–117)
BUN: 45 mg/dL — ABNORMAL HIGH (ref 6–23)
Calcium: 9.6 mg/dL (ref 8.4–10.5)
Chloride: 106 mEq/L (ref 96–112)
Creatinine, Ser: 2.17 mg/dL — ABNORMAL HIGH (ref 0.50–1.35)
Glucose, Bld: 137 mg/dL — ABNORMAL HIGH (ref 70–99)
Potassium: 4.6 mEq/L (ref 3.5–5.3)

## 2011-07-18 LAB — PROTEIN ELECTROPHORESIS, SERUM, WITH REFLEX
Albumin ELP: 56.6 % (ref 55.8–66.1)
Total Protein, Serum Electrophoresis: 6.7 g/dL (ref 6.0–8.3)

## 2011-07-18 LAB — TESTOSTERONE: Testosterone: 305.71 ng/dL (ref 250–890)

## 2011-07-28 DIAGNOSIS — R21 Rash and other nonspecific skin eruption: Secondary | ICD-10-CM | POA: Diagnosis not present

## 2011-07-28 DIAGNOSIS — L299 Pruritus, unspecified: Secondary | ICD-10-CM | POA: Diagnosis not present

## 2011-07-28 DIAGNOSIS — E119 Type 2 diabetes mellitus without complications: Secondary | ICD-10-CM | POA: Diagnosis not present

## 2011-07-28 DIAGNOSIS — L408 Other psoriasis: Secondary | ICD-10-CM | POA: Diagnosis not present

## 2011-08-11 ENCOUNTER — Encounter (HOSPITAL_COMMUNITY): Payer: Self-pay | Admitting: Emergency Medicine

## 2011-08-11 ENCOUNTER — Ambulatory Visit: Payer: Medicare Other

## 2011-08-11 ENCOUNTER — Emergency Department (HOSPITAL_COMMUNITY): Payer: Medicare Other | Admitting: Anesthesiology

## 2011-08-11 ENCOUNTER — Encounter (HOSPITAL_COMMUNITY): Admission: EM | Disposition: A | Discharge status: Home / Self Care | Payer: Self-pay | Source: Home / Self Care

## 2011-08-11 ENCOUNTER — Emergency Department (HOSPITAL_COMMUNITY): Payer: Medicare Other

## 2011-08-11 ENCOUNTER — Inpatient Hospital Stay (HOSPITAL_COMMUNITY)
Admission: EM | Admit: 2011-08-11 | Discharge: 2011-08-19 | DRG: 327 | Disposition: A | Discharge status: Home / Self Care | Payer: Medicare Other | Attending: General Surgery | Admitting: General Surgery

## 2011-08-11 ENCOUNTER — Encounter (HOSPITAL_COMMUNITY): Payer: Self-pay | Admitting: Anesthesiology

## 2011-08-11 DIAGNOSIS — Z79899 Other long term (current) drug therapy: Secondary | ICD-10-CM | POA: Diagnosis not present

## 2011-08-11 DIAGNOSIS — E291 Testicular hypofunction: Secondary | ICD-10-CM | POA: Diagnosis not present

## 2011-08-11 DIAGNOSIS — J309 Allergic rhinitis, unspecified: Secondary | ICD-10-CM | POA: Diagnosis present

## 2011-08-11 DIAGNOSIS — D72829 Elevated white blood cell count, unspecified: Secondary | ICD-10-CM | POA: Diagnosis not present

## 2011-08-11 DIAGNOSIS — J90 Pleural effusion, not elsewhere classified: Secondary | ICD-10-CM | POA: Diagnosis present

## 2011-08-11 DIAGNOSIS — E875 Hyperkalemia: Secondary | ICD-10-CM | POA: Diagnosis not present

## 2011-08-11 DIAGNOSIS — K668 Other specified disorders of peritoneum: Secondary | ICD-10-CM | POA: Diagnosis present

## 2011-08-11 DIAGNOSIS — K255 Chronic or unspecified gastric ulcer with perforation: Secondary | ICD-10-CM | POA: Diagnosis not present

## 2011-08-11 DIAGNOSIS — K560 Paralytic ileus: Secondary | ICD-10-CM | POA: Diagnosis not present

## 2011-08-11 DIAGNOSIS — R339 Retention of urine, unspecified: Secondary | ICD-10-CM | POA: Diagnosis not present

## 2011-08-11 DIAGNOSIS — N179 Acute kidney failure, unspecified: Secondary | ICD-10-CM | POA: Diagnosis present

## 2011-08-11 DIAGNOSIS — N289 Disorder of kidney and ureter, unspecified: Secondary | ICD-10-CM | POA: Diagnosis not present

## 2011-08-11 DIAGNOSIS — C8589 Other specified types of non-Hodgkin lymphoma, extranodal and solid organ sites: Secondary | ICD-10-CM | POA: Diagnosis present

## 2011-08-11 DIAGNOSIS — K265 Chronic or unspecified duodenal ulcer with perforation: Secondary | ICD-10-CM | POA: Diagnosis present

## 2011-08-11 DIAGNOSIS — R1110 Vomiting, unspecified: Secondary | ICD-10-CM | POA: Diagnosis not present

## 2011-08-11 DIAGNOSIS — R1013 Epigastric pain: Secondary | ICD-10-CM | POA: Diagnosis not present

## 2011-08-11 DIAGNOSIS — R109 Unspecified abdominal pain: Secondary | ICD-10-CM | POA: Diagnosis not present

## 2011-08-11 DIAGNOSIS — N182 Chronic kidney disease, stage 2 (mild): Secondary | ICD-10-CM | POA: Diagnosis present

## 2011-08-11 DIAGNOSIS — E119 Type 2 diabetes mellitus without complications: Secondary | ICD-10-CM | POA: Diagnosis present

## 2011-08-11 DIAGNOSIS — K261 Acute duodenal ulcer with perforation: Secondary | ICD-10-CM | POA: Diagnosis not present

## 2011-08-11 DIAGNOSIS — K5730 Diverticulosis of large intestine without perforation or abscess without bleeding: Secondary | ICD-10-CM | POA: Diagnosis not present

## 2011-08-11 DIAGNOSIS — R079 Chest pain, unspecified: Secondary | ICD-10-CM | POA: Diagnosis not present

## 2011-08-11 DIAGNOSIS — K275 Chronic or unspecified peptic ulcer, site unspecified, with perforation: Secondary | ICD-10-CM | POA: Diagnosis not present

## 2011-08-11 DIAGNOSIS — Y9210 Unspecified residential institution as the place of occurrence of the external cause: Secondary | ICD-10-CM | POA: Diagnosis not present

## 2011-08-11 DIAGNOSIS — K929 Disease of digestive system, unspecified: Secondary | ICD-10-CM | POA: Diagnosis not present

## 2011-08-11 DIAGNOSIS — C8307 Small cell B-cell lymphoma, spleen: Secondary | ICD-10-CM | POA: Diagnosis present

## 2011-08-11 DIAGNOSIS — K266 Chronic or unspecified duodenal ulcer with both hemorrhage and perforation: Secondary | ICD-10-CM | POA: Diagnosis not present

## 2011-08-11 DIAGNOSIS — I129 Hypertensive chronic kidney disease with stage 1 through stage 4 chronic kidney disease, or unspecified chronic kidney disease: Secondary | ICD-10-CM | POA: Diagnosis present

## 2011-08-11 DIAGNOSIS — Y838 Other surgical procedures as the cause of abnormal reaction of the patient, or of later complication, without mention of misadventure at the time of the procedure: Secondary | ICD-10-CM | POA: Diagnosis not present

## 2011-08-11 DIAGNOSIS — I10 Essential (primary) hypertension: Secondary | ICD-10-CM | POA: Diagnosis present

## 2011-08-11 HISTORY — PX: LAPAROTOMY: SHX154

## 2011-08-11 LAB — COMPREHENSIVE METABOLIC PANEL
AST: 23 U/L (ref 0–37)
Albumin: 3.7 g/dL (ref 3.5–5.2)
Alkaline Phosphatase: 98 U/L (ref 39–117)
Chloride: 105 mEq/L (ref 96–112)
Potassium: 4.7 mEq/L (ref 3.5–5.1)
Total Bilirubin: 0.6 mg/dL (ref 0.3–1.2)

## 2011-08-11 LAB — URINALYSIS, ROUTINE W REFLEX MICROSCOPIC
Bilirubin Urine: NEGATIVE
Ketones, ur: NEGATIVE mg/dL
Nitrite: NEGATIVE
pH: 5.5 (ref 5.0–8.0)

## 2011-08-11 LAB — CBC
HCT: 35.1 % — ABNORMAL LOW (ref 39.0–52.0)
MCHC: 33.9 g/dL (ref 30.0–36.0)
MCV: 98.3 fL (ref 78.0–100.0)
RDW: 15.4 % (ref 11.5–15.5)

## 2011-08-11 LAB — DIFFERENTIAL
Basophils Absolute: 0 10*3/uL (ref 0.0–0.1)
Basophils Relative: 0 % (ref 0–1)
Eosinophils Relative: 4 % (ref 0–5)
Lymphocytes Relative: 18 % (ref 12–46)
Monocytes Absolute: 1.2 10*3/uL — ABNORMAL HIGH (ref 0.1–1.0)

## 2011-08-11 LAB — GLUCOSE, CAPILLARY: Glucose-Capillary: 132 mg/dL — ABNORMAL HIGH (ref 70–99)

## 2011-08-11 SURGERY — LAPAROTOMY, EXPLORATORY
Anesthesia: General | Site: Abdomen | Wound class: Dirty or Infected

## 2011-08-11 MED ORDER — MORPHINE SULFATE 2 MG/ML IJ SOLN: 1.0000 mg | INTRAMUSCULAR | Status: DC | PRN

## 2011-08-11 MED ORDER — PANTOPRAZOLE SODIUM 40 MG IV SOLR
40.0000 mg | Freq: Two times a day (BID) | INTRAVENOUS | Status: DC
  Administered 2011-08-11 – 2011-08-17 (×13): 40 mg via INTRAVENOUS
  Filled 2011-08-11 (×15): qty 40

## 2011-08-11 MED ORDER — ROCURONIUM BROMIDE 100 MG/10ML IV SOLN
INTRAVENOUS | Status: DC | PRN
  Administered 2011-08-11: 50 mg via INTRAVENOUS

## 2011-08-11 MED ORDER — PANTOPRAZOLE SODIUM 40 MG IV SOLR
40.0000 mg | Freq: Once | INTRAVENOUS | Status: AC
  Administered 2011-08-11: 40 mg via INTRAVENOUS
  Filled 2011-08-11: qty 40

## 2011-08-11 MED ORDER — GLYCOPYRROLATE 0.2 MG/ML IJ SOLN
INTRAMUSCULAR | Status: DC | PRN
  Administered 2011-08-11: .8 mg via INTRAVENOUS
  Administered 2011-08-11: 0.2 mg via INTRAVENOUS

## 2011-08-11 MED ORDER — PROPOFOL 10 MG/ML IV EMUL
INTRAVENOUS | Status: DC | PRN
  Administered 2011-08-11: 40 mg via INTRAVENOUS
  Administered 2011-08-11: 100 mg via INTRAVENOUS

## 2011-08-11 MED ORDER — ONDANSETRON HCL 4 MG/2ML IJ SOLN
4.0000 mg | Freq: Once | INTRAMUSCULAR | Status: AC
  Administered 2011-08-11: 4 mg via INTRAVENOUS

## 2011-08-11 MED ORDER — SUCCINYLCHOLINE CHLORIDE 20 MG/ML IJ SOLN
INTRAMUSCULAR | Status: DC | PRN
  Administered 2011-08-11: 120 mg via INTRAVENOUS

## 2011-08-11 MED ORDER — ONDANSETRON HCL 4 MG/2ML IJ SOLN: 4.0000 mg | Freq: Four times a day (QID) | INTRAMUSCULAR | Status: DC | PRN

## 2011-08-11 MED ORDER — ONDANSETRON HCL 4 MG PO TABS: 4.0000 mg | ORAL_TABLET | Freq: Four times a day (QID) | ORAL | Status: DC | PRN

## 2011-08-11 MED ORDER — DIPHENHYDRAMINE HCL 12.5 MG/5ML PO ELIX
12.5000 mg | ORAL_SOLUTION | Freq: Four times a day (QID) | ORAL | Status: DC | PRN
  Filled 2011-08-11: qty 5

## 2011-08-11 MED ORDER — METOPROLOL TARTRATE 1 MG/ML IV SOLN
5.0000 mg | Freq: Four times a day (QID) | INTRAVENOUS | Status: DC
  Administered 2011-08-11 – 2011-08-16 (×16): 5 mg via INTRAVENOUS
  Filled 2011-08-11 (×21): qty 5

## 2011-08-11 MED ORDER — HYDROMORPHONE HCL PF 1 MG/ML IJ SOLN: 1.0000 mg | Freq: Once | INTRAMUSCULAR | Status: DC

## 2011-08-11 MED ORDER — PIPERACILLIN-TAZOBACTAM 3.375 G IVPB
3.3750 g | Freq: Three times a day (TID) | INTRAVENOUS | Status: AC
  Administered 2011-08-11 – 2011-08-18 (×22): 3.375 g via INTRAVENOUS
  Filled 2011-08-11 (×24): qty 50

## 2011-08-11 MED ORDER — HYDROMORPHONE HCL PF 2 MG/ML IJ SOLN
2.0000 mg | Freq: Once | INTRAMUSCULAR | Status: AC
  Administered 2011-08-11: 2 mg via INTRAVENOUS
  Filled 2011-08-11: qty 1

## 2011-08-11 MED ORDER — ONDANSETRON HCL 4 MG/2ML IJ SOLN: 4.0000 mg | INTRAMUSCULAR | Status: DC | PRN

## 2011-08-11 MED ORDER — SODIUM CHLORIDE 0.9 % IV SOLN
INTRAVENOUS | Status: DC
  Administered 2011-08-11: 13:00:00 via INTRAVENOUS
  Administered 2011-08-11: 125 mL/h via INTRAVENOUS

## 2011-08-11 MED ORDER — CLINDAMYCIN PHOSPHATE 600 MG/50ML IV SOLN
600.0000 mg | Freq: Once | INTRAVENOUS | Status: AC
  Administered 2011-08-11: 600 mg via INTRAVENOUS
  Filled 2011-08-11: qty 50

## 2011-08-11 MED ORDER — DIPHENHYDRAMINE HCL 50 MG/ML IJ SOLN: 12.5000 mg | Freq: Four times a day (QID) | INTRAMUSCULAR | Status: DC | PRN

## 2011-08-11 MED ORDER — HYDROMORPHONE HCL PF 2 MG/ML IJ SOLN: 2.0000 mg | Freq: Once | INTRAMUSCULAR | Status: DC

## 2011-08-11 MED ORDER — HYDROMORPHONE 0.3 MG/ML IV SOLN
INTRAVENOUS | Status: DC
  Administered 2011-08-11: 16:00:00 via INTRAVENOUS
  Administered 2011-08-11: 1 mg via INTRAVENOUS
  Administered 2011-08-12: 1.8 mg via INTRAVENOUS
  Administered 2011-08-12: 1.2 mg via INTRAVENOUS
  Administered 2011-08-12: 1.19 mg via INTRAVENOUS
  Administered 2011-08-12: 0.32 mg via INTRAVENOUS
  Administered 2011-08-12: 1.79 mg via INTRAVENOUS
  Administered 2011-08-12: 1.59 mg via INTRAVENOUS
  Administered 2011-08-13: 0.4 mg via INTRAVENOUS
  Administered 2011-08-13: 0.199 mg via INTRAVENOUS
  Administered 2011-08-13: 0.6 mg via INTRAVENOUS
  Administered 2011-08-13: 0.2 mg via INTRAVENOUS
  Administered 2011-08-14: 0.999 mg via INTRAVENOUS
  Administered 2011-08-14: 0.599 mg via INTRAVENOUS
  Administered 2011-08-14: 0.6 mg via INTRAVENOUS
  Administered 2011-08-14: 0.399 mg via INTRAVENOUS
  Administered 2011-08-15 (×2): 0.2 mg via INTRAVENOUS
  Administered 2011-08-15: 0.799 mg via INTRAVENOUS
  Administered 2011-08-15: 0.2 mg via INTRAVENOUS
  Administered 2011-08-15: 0.999 mg via INTRAVENOUS
  Administered 2011-08-16: 0.3 mg via INTRAVENOUS
  Administered 2011-08-16: 0.4 mg via INTRAVENOUS
  Administered 2011-08-16: 0.339 mg via INTRAVENOUS

## 2011-08-11 MED ORDER — FUROSEMIDE 10 MG/ML IJ SOLN
20.0000 mg | Freq: Every day | INTRAMUSCULAR | Status: DC
  Administered 2011-08-11 – 2011-08-12 (×2): 20 mg via INTRAVENOUS
  Filled 2011-08-11 (×2): qty 2

## 2011-08-11 MED ORDER — DEXTROSE 5 % IV SOLN
2.0000 g | Freq: Once | INTRAVENOUS | Status: AC
  Administered 2011-08-11: 2 g via INTRAVENOUS
  Filled 2011-08-11: qty 2

## 2011-08-11 MED ORDER — SODIUM CHLORIDE 0.9 % IV SOLN
INTRAVENOUS | Status: DC | PRN
  Administered 2011-08-11: 14:00:00 via INTRAVENOUS

## 2011-08-11 MED ORDER — SODIUM CHLORIDE 0.9 % IJ SOLN: 9.0000 mL | INTRAMUSCULAR | Status: DC | PRN

## 2011-08-11 MED ORDER — PROMETHAZINE HCL 25 MG/ML IJ SOLN
12.5000 mg | Freq: Once | INTRAMUSCULAR | Status: DC
  Filled 2011-08-11: qty 1

## 2011-08-11 MED ORDER — SODIUM CHLORIDE 0.9 % IV SOLN
INTRAVENOUS | Status: DC
  Administered 2011-08-12 – 2011-08-15 (×7): via INTRAVENOUS

## 2011-08-11 MED ORDER — ONDANSETRON HCL 4 MG/2ML IJ SOLN
INTRAMUSCULAR | Status: AC
  Filled 2011-08-11: qty 2

## 2011-08-11 MED ORDER — NALOXONE HCL 0.4 MG/ML IJ SOLN: 0.4000 mg | INTRAMUSCULAR | Status: DC | PRN

## 2011-08-11 MED ORDER — HEPARIN SODIUM (PORCINE) 5000 UNIT/ML IJ SOLN
5000.0000 [IU] | Freq: Three times a day (TID) | INTRAMUSCULAR | Status: DC
  Administered 2011-08-12 – 2011-08-19 (×22): 5000 [IU] via SUBCUTANEOUS
  Filled 2011-08-11 (×25): qty 1

## 2011-08-11 MED ORDER — MIDAZOLAM HCL 5 MG/5ML IJ SOLN
INTRAMUSCULAR | Status: DC | PRN
  Administered 2011-08-11: 2 mg via INTRAVENOUS

## 2011-08-11 MED ORDER — LEVOTHYROXINE SODIUM 100 MCG IV SOLR
75.0000 ug | Freq: Every day | INTRAVENOUS | Status: DC
  Administered 2011-08-12 – 2011-08-15 (×4): 76 ug via INTRAVENOUS
  Filled 2011-08-11 (×5): qty 3.8

## 2011-08-11 MED ORDER — NEOSTIGMINE METHYLSULFATE 1 MG/ML IJ SOLN
INTRAMUSCULAR | Status: DC | PRN
  Administered 2011-08-11: 4 mg via INTRAVENOUS
  Administered 2011-08-11: 1 mg via INTRAVENOUS

## 2011-08-11 MED ORDER — 0.9 % SODIUM CHLORIDE (POUR BTL) OPTIME
TOPICAL | Status: DC | PRN
  Administered 2011-08-11: 3000 mL

## 2011-08-11 MED ORDER — FENTANYL CITRATE 0.05 MG/ML IJ SOLN
INTRAMUSCULAR | Status: DC | PRN
  Administered 2011-08-11: 100 ug via INTRAVENOUS
  Administered 2011-08-11: 150 ug via INTRAVENOUS

## 2011-08-11 MED ORDER — SODIUM CHLORIDE 0.9 % IV SOLN: INTRAVENOUS | Status: DC

## 2011-08-11 SURGICAL SUPPLY — 48 items
BLADE SURG ROTATE 9660 (MISCELLANEOUS) ×1 IMPLANT
CANISTER SUCTION 2500CC (MISCELLANEOUS) ×3 IMPLANT
CHLORAPREP W/TINT 26ML (MISCELLANEOUS) ×3 IMPLANT
CLOTH BEACON ORANGE TIMEOUT ST (SAFETY) ×3 IMPLANT
COVER SURGICAL LIGHT HANDLE (MISCELLANEOUS) ×3 IMPLANT
DRAPE LAPAROSCOPIC ABDOMINAL (DRAPES) ×3 IMPLANT
DRAPE UTILITY 15X26 W/TAPE STR (DRAPE) ×6 IMPLANT
DRAPE WARM FLUID 44X44 (DRAPE) ×3 IMPLANT
DRESSING TELFA 8X3 (GAUZE/BANDAGES/DRESSINGS) ×1 IMPLANT
DRSG PAD ABDOMINAL 8X10 ST (GAUZE/BANDAGES/DRESSINGS) ×1 IMPLANT
ELECT BLADE 6.5 EXT (BLADE) ×3 IMPLANT
ELECT REM PT RETURN 9FT ADLT (ELECTROSURGICAL) ×3
ELECTRODE REM PT RTRN 9FT ADLT (ELECTROSURGICAL) ×2 IMPLANT
EVACUATOR SILICONE 100CC (DRAIN) ×1 IMPLANT
GLOVE BIO SURGEON STRL SZ8 (GLOVE) ×3 IMPLANT
GLOVE BIOGEL PI IND STRL 6.5 (GLOVE) IMPLANT
GLOVE BIOGEL PI IND STRL 8 (GLOVE) ×2 IMPLANT
GLOVE BIOGEL PI INDICATOR 6.5 (GLOVE) ×1
GLOVE BIOGEL PI INDICATOR 8 (GLOVE) ×2
GLOVE ECLIPSE 8.0 STRL XLNG CF (GLOVE) ×1 IMPLANT
GLOVE EXAM NITRILE MICROT MD (GLOVE) ×1 IMPLANT
GLOVE SURG SS PI 6.5 STRL IVOR (GLOVE) ×1 IMPLANT
GOWN PREVENTION PLUS XLARGE (GOWN DISPOSABLE) ×3 IMPLANT
GOWN STRL NON-REIN LRG LVL3 (GOWN DISPOSABLE) ×6 IMPLANT
KIT BASIN OR (CUSTOM PROCEDURE TRAY) ×3 IMPLANT
KIT ROOM TURNOVER OR (KITS) ×3 IMPLANT
LIGASURE IMPACT 36 18CM CVD LR (INSTRUMENTS) IMPLANT
NS IRRIG 1000ML POUR BTL (IV SOLUTION) ×6 IMPLANT
PACK GENERAL/GYN (CUSTOM PROCEDURE TRAY) ×3 IMPLANT
PAD ARMBOARD 7.5X6 YLW CONV (MISCELLANEOUS) ×6 IMPLANT
SPECIMEN JAR X LARGE (MISCELLANEOUS) IMPLANT
SPONGE GAUZE 4X4 12PLY (GAUZE/BANDAGES/DRESSINGS) ×4 IMPLANT
SPONGE LAP 18X18 X RAY DECT (DISPOSABLE) IMPLANT
STAPLER VISISTAT 35W (STAPLE) ×3 IMPLANT
SUCTION POOLE TIP (SUCTIONS) ×3 IMPLANT
SUT ETHILON 2 0 FS 18 (SUTURE) ×1 IMPLANT
SUT PDS AB 1 TP1 96 (SUTURE) ×6 IMPLANT
SUT SILK 2 0 SH (SUTURE) ×2 IMPLANT
SUT SILK 2 0 SH CR/8 (SUTURE) ×3 IMPLANT
SUT SILK 2 0 TIES 10X30 (SUTURE) ×3 IMPLANT
SUT SILK 3 0 SH CR/8 (SUTURE) ×3 IMPLANT
SUT SILK 3 0 TIES 10X30 (SUTURE) ×3 IMPLANT
TAPE CLOTH SURG 6X10 WHT LF (GAUZE/BANDAGES/DRESSINGS) ×2 IMPLANT
TOWEL OR 17X24 6PK STRL BLUE (TOWEL DISPOSABLE) ×3 IMPLANT
TOWEL OR 17X26 10 PK STRL BLUE (TOWEL DISPOSABLE) ×3 IMPLANT
TRAY FOLEY CATH 14FRSI W/METER (CATHETERS) ×1 IMPLANT
WATER STERILE IRR 1000ML POUR (IV SOLUTION) ×3 IMPLANT
YANKAUER SUCT BULB TIP NO VENT (SUCTIONS) ×1 IMPLANT

## 2011-08-11 NOTE — Op Note (Signed)
08/11/2011  3:15 PM  PATIENT:  Larry [name]  71 y.o. male  PRE-OPERATIVE DIAGNOSIS:  perforated bowel  POST-OPERATIVE DIAGNOSIS:  perforated duodenal ulcer  PROCEDURE:  Procedure(s): EXPLORATORY LAPAROTOMY REPAIR OF PERFORATED ULCER  SURGEON:  Surgeon(s): Liz Malady, MD  PHYSICIAN ASSISTANT:   ASSISTANTS: Estelle Grumbles, MD  ANESTHESIA:   general  EBL:  Total I/O In: 600 [I.V.:600] Out: 1250 [Urine:1200; Blood:50]  BLOOD ADMINISTERED:none  DRAINS: (1) Jackson-Pratt drain(s) with closed bulb suction in the ruq   SPECIMEN:  No Specimen  DISPOSITION OF SPECIMEN:  N/A  COUNTS:  YES  DICTATION: .Dragon Dictation patient presented to the emergency room with acute onset of abdominal pain. He was found to have free air and evidence of bowel perforation, likely perforated duodenal ulcer. He was emergently brought to the operating room. He is receiving IV antibiotics. He was identified in the preop holding area. Informed consent was obtained. He was brought to the operating room and general endotracheal anesthesia was administered by the anesthesia service. Foley catheter was placed by nursing staff his abdomen was prepped and draped in sterile fashion. We did time out procedure. Upper midline incision was made. Subcutaneous tissues were dissected down revealing the anterior fascia. This was divided along the midline. The perineal cavity was opened under direct vision. The fascia was opened the length of the incision. Exploration revealed bilious fluid in the right upper quadrant near the gallbladder. There were some adhesions in the left upper quadrant from his previous surgery. Further inspection revealed a 7 mm perforation in the duodenum just past the pylorus. This is consistent with perforated duodenal ulcer. The tissue was viable. There was some inflammation but no gross evidence of mass. The abdomen was copiously irrigated. Next the repair was accomplished with a patch of  omentum. This was done by placing multiple 0 silk sutures across the perforation. Next a nice tongue of omentum was laid down across and the sutures were tied securing the omentum and patching the perforation closed. Nasogastric tube was manipulated into position. The abdomen was again copiously irrigated. The patch remain in position. 19 [place] Blake drain was laid up in the area of the patch and secured to the skin with 2-0 nylon suture. Counts were noted to be correct. Irrigation fluid returned clear. Fascia was closed with 2 lengths of running #1 looped PDS tied in the middle. Subcutaneous tissues were irrigated skin was closed with staples. Telfa wicks were placed in the wound. Sterile dressing was applied. Counts were all again correct. Patient tolerated procedure well without apparent complications and was taken recovery room in stable condition with plans for admission to the intensive care unit.  PATIENT DISPOSITION:  ICU - extubated and stable.   Delay start of Pharmacological VTE agent (>24hrs) due to surgical blood loss or risk of bleeding:  no  Violeta Gelinas, MD, MPH, FACS Pager: [phone]  4/12/20133:15 PM

## 2011-08-11 NOTE — Transfer of Care (Signed)
Immediate Anesthesia Transfer of Care Note  Patient: Larry [name]  Procedure(s) Performed: Procedure(s) (LRB): EXPLORATORY LAPAROTOMY (N/A) REPAIR OF PERFORATED ULCER ()  Patient Location: PACU  Anesthesia Type: General  Level of Consciousness: awake  Airway & Oxygen Therapy: Patient Spontanous Breathing and Patient connected to nasal cannula oxygen  Post-op Assessment: Report given to PACU RN and Post -op Vital signs reviewed and stable  Post vital signs: Reviewed and stable  Complications: No apparent anesthesia complications

## 2011-08-11 NOTE — Preoperative (Signed)
Beta Blockers   Reason not to administer Beta Blockers:Not Applicable, taken 

## 2011-08-11 NOTE — Anesthesia Postprocedure Evaluation (Signed)
  Anesthesia Post-op Note  Patient: Larry [name]  Procedure(s) Performed: Procedure(s) (LRB): EXPLORATORY LAPAROTOMY (N/A) REPAIR OF PERFORATED ULCER ()  Patient Location: PACU  Anesthesia Type: General  Level of Consciousness: sedated  Airway and Oxygen Therapy: Patient Spontanous Breathing and Patient connected to nasal cannula oxygen  Post-op Pain: none  Post-op Assessment: Post-op Vital signs reviewed, Patient's Cardiovascular Status Stable, Respiratory Function Stable, Patent Airway, No signs of Nausea or vomiting and Pain level controlled  Post-op Vital Signs: Reviewed and stable  Complications: No apparent anesthesia complications

## 2011-08-11 NOTE — ED Notes (Signed)
Explained to patient and family member we need a urine sample.  Patient stated he was unable to urinate at this time.  Patient has a urinal at bedside.

## 2011-08-11 NOTE — Interval H&P Note (Signed)
History and Physical Interval Note:  08/11/2011 2:06 PM  Larry [name]  has presented today for surgery, with the diagnosis of perforated bowel  The various methods of treatment have been discussed with the patient and family. After consideration of risks, benefits and other options for treatment, the patient has consented to  Procedure(s) (LRB): EXPLORATORY LAPAROTOMY (N/A) REPAIR PERFORATION as a surgical intervention .  The patients' history has been reviewed, patient examined, no change in status, stable for surgery.  I have reviewed the patients' chart and labs.  Questions were answered to the patient's satisfaction.     [name] E

## 2011-08-11 NOTE — H&P (Signed)
Free air from likely perforated duodenal ulcer.  For ex lap and repair. Procedure, risks, and benefits D/W the patient and his wife.  They agree. Patient examined and I agree with the assessment and plan  Violeta Gelinas, MD, MPH, FACS Pager: [phone]  08/11/2011 2:04 PM

## 2011-08-11 NOTE — H&P (Signed)
Larry [name] 07-Sep-1940  [id number].   Primary Care MD: [name] Onc: Dr. Arlan Organ Kidney: Dr. Caryn Section Chief Complaint/Reason for Consult: perforated viscus HPI: This is a 71 yo male with HTN, DM, and non-Hodgkins lymphoma that has been in remission for 3 years per the patient.  He was supposed to see Dr. Myna Hidalgo today actually.    He said that a few days ago he had some mild abdominal discomfort but nothing bad.  It went away.  This morning at 0700am he had an acute onset of abdominal pain.  He said that it radiated up into his chest and his wife says he became diaphoretic.  Due to this pain, they came to the [facility].  He initially had an x-ray that was unimpressive, but a CT scan was ordered which revealed free air and free extravasation of contrast near the distal stomach/proximal duodenum.  We were then called and asked to evaluate.  Review of Systems: Please see HPI, otherwise all other systems have been reviewed and are negative.  History reviewed. No pertinent family history.  Past Medical History  Diagnosis Date  . Cancer     Non Hodgkins Lymphoma  . Hernia 1949  . Diabetes mellitus 5-6 yrs    T2DM  . Hypertension   . Hyperkalemia   . 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 Hypotestosteronism 03/29/2011    Past Surgical History  Procedure Date  . Splenectomy, total 2009  . Appendectomy 1948  . Pancreatectomy 2009    Partial w/ splenectomy  . Tonsillectomy   . Hernia repair 1949    unknown what type of hernia    Social History:  reports that he has never smoked. He has never used smokeless tobacco. He reports that he does not drink alcohol. His drug history not on file.  Allergies:  Allergies  Allergen Reactions  . Iohexol     Pt states has history of reaction in the past, can not remember what exactly happened, but was told by the dr never to have the contrast again.    Medications Prior to Admission  Medication Dose Route Frequency Provider  Last Rate Last Dose  . 0.9 %  sodium chloride infusion   Intravenous Continuous Cheri Guppy, MD 125 mL/hr at 08/11/11 1312    . cefoTAXime (CLAFORAN) 2 g in dextrose 5 % 50 mL IVPB  2 g Intravenous Once Cheri Guppy, MD   2 g at 08/11/11 1332  . clindamycin (CLEOCIN) IVPB 600 mg  600 mg Intravenous Once Cheri Guppy, MD   600 mg at 08/11/11 1310  . HYDROmorphone (DILAUDID) injection 2 mg  2 mg Intravenous Once Cheri Guppy, MD   2 mg at 08/11/11 0806  . HYDROmorphone (DILAUDID) injection 2 mg  2 mg Intravenous Once Cheri Guppy, MD   2 mg at 08/11/11 0852  . morphine 2 MG/ML injection 1-4 mg  1-4 mg Intravenous Q2H PRN Letha Cape, PA      . ondansetron [facility]) injection 4 mg  4 mg Intravenous Once Cheri Guppy, MD   4 mg at 08/11/11 0805  . ondansetron (ZOFRAN) injection 4 mg  4 mg Intravenous Q4H PRN Letha Cape, PA      . pantoprazole (PROTONIX) injection 40 mg  40 mg Intravenous Once Cheri Guppy, MD   40 mg at 08/11/11 0812  . sodium chloride 0.9 % 1,000 mL with potassium chloride 20 mEq infusion   Intravenous Continuous Letha Cape, PA      .  DISCONTD: HYDROmorphone (DILAUDID) injection 1 mg  1 mg Intravenous Once Cheri Guppy, MD      . DISCONTD: HYDROmorphone (DILAUDID) injection 2 mg  2 mg Intravenous Once Cheri Guppy, MD      . DISCONTD: promethazine (PHENERGAN) injection 12.5 mg  12.5 mg Intravenous Once Cheri Guppy, MD       Medications Prior to Admission  Medication Sig Dispense Refill  . allopurinol (ZYLOPRIM) 150 mg TABS Take 150 mg by mouth daily.        Marland Kitchen amLODipine (NORVASC) 10 MG tablet Take 10 mg by mouth daily.        . colchicine 0.6 MG tablet Take 0.6 mg by mouth every other day.       . doxazosin (CARDURA) 2 MG tablet Take 2 mg by mouth 3 (three) times daily.        . furosemide (LASIX) 40 MG tablet Take 80 mg by mouth daily.       Marland Kitchen levothyroxine (SYNTHROID, LEVOTHROID) 150 MCG tablet Take 150 mcg by  mouth daily.        . sitaGLIPtin (JANUVIA) 50 MG tablet Take 50 mg by mouth daily.      Marland Kitchen tenormin (ATENOLOL) 12.5 mg TABS Take 12.5 mg by mouth daily.          Blood pressure 138/80, pulse 89, temperature 97.7 F (36.5 C), temperature source Oral, resp. rate 24, SpO2 96.00%. Physical Exam: General: pleasant, WD, WN white male who is laying in bed in moderate distress secondary to abdominal pain.  HEENT: head is normocephalic, atraumatic.  Sclera are noninjected.  PERRL.  Ears and nose without any masses or lesions.  Mouth is pink. Heart: regular, rate, and rhythm.  Normal s1,s2. Suspect soft murmur, no gallops, or rubs noted.  Palpable radial and pedal pulses bilaterally Lungs: CTAB, no wheezes, rhonchi, or rales noted.  Respiratory effort nonlabored Abd: soft, diffusely tender with peritoneal signs,  He does have some voluntary guarding present as well, mild upper abd distention, +BS, no masses, hernias, or organomegaly.  Several scars noted from prior surgery MS: all 4 extremities are symmetrical with no cyanosis, clubbing, or edema.  He does have protuberant veins in his lower extremities with some chronic venous stasis changes. Skin: warm and dry with no masses, lesions, or rashes Psych: A&Ox3 with an appropriate affect, in obvious discomfort.    Results for orders placed during the hospital encounter of 08/11/11 (from the past 48 hour(s))  COMPREHENSIVE METABOLIC PANEL     Status: Abnormal   Collection Time   08/11/11  8:49 AM      Component Value Range Comment   Sodium 138  135 - 145 (mEq/L)    Potassium 4.7  3.5 - 5.1 (mEq/L)    Chloride 105  96 - 112 (mEq/L)    CO2 20  19 - 32 (mEq/L)    Glucose, Bld 173 (*) 70 - 99 (mg/dL)    BUN 57 (*) 6 - 23 (mg/dL)    Creatinine, Ser 1.61 (*) 0.50 - 1.35 (mg/dL)    Calcium 9.4  8.4 - 10.5 (mg/dL)    Total Protein 6.8  6.0 - 8.3 (g/dL)    Albumin 3.7  3.5 - 5.2 (g/dL)    AST 23  0 - 37 (U/L)    ALT 20  0 - 53 (U/L)    Alkaline  Phosphatase 98  39 - 117 (U/L)    Total Bilirubin 0.6  0.3 - 1.2 (mg/dL)  GFR calc non Af Amer 37 (*) >90 (mL/min)    GFR calc Af Amer 43 (*) >90 (mL/min)   LIPASE, BLOOD     Status: Abnormal   Collection Time   08/11/11  8:49 AM      Component Value Range Comment   Lipase 175 (*) 11 - 59 (U/L)   CBC     Status: Abnormal   Collection Time   08/11/11  8:49 AM      Component Value Range Comment   WBC 16.6 (*) 4.0 - 10.5 (K/uL)    RBC 3.57 (*) 4.22 - 5.81 (MIL/uL)    Hemoglobin 11.9 (*) 13.0 - 17.0 (g/dL)    HCT 16.1 (*) 09.6 - 52.0 (%)    MCV 98.3  78.0 - 100.0 (fL)    MCH 33.3  26.0 - 34.0 (pg)    MCHC 33.9  30.0 - 36.0 (g/dL)    RDW 04.5  40.9 - 81.1 (%)    Platelets 225  150 - 400 (K/uL)   DIFFERENTIAL     Status: Abnormal   Collection Time   08/11/11  8:49 AM      Component Value Range Comment   Neutrophils Relative 70  43 - 77 (%)    Neutro Abs 11.5 (*) 1.7 - 7.7 (K/uL)    Lymphocytes Relative 18  12 - 46 (%)    Lymphs Abs 3.0  0.7 - 4.0 (K/uL)    Monocytes Relative 8  3 - 12 (%)    Monocytes Absolute 1.2 (*) 0.1 - 1.0 (K/uL)    Eosinophils Relative 4  0 - 5 (%)    Eosinophils Absolute 0.7  0.0 - 0.7 (K/uL)    Basophils Relative 0  0 - 1 (%)    Basophils Absolute 0.0  0.0 - 0.1 (K/uL)   POCT I-STAT TROPONIN I     Status: Normal   Collection Time   08/11/11  9:00 AM      Component Value Range Comment   Troponin i, poc 0.02  0.00 - 0.08 (ng/mL)    Comment 3            URINALYSIS, ROUTINE W REFLEX MICROSCOPIC     Status: Normal   Collection Time   08/11/11 10:50 AM      Component Value Range Comment   Color, Urine YELLOW  YELLOW     APPearance CLEAR  CLEAR     Specific Gravity, Urine 1.011  1.005 - 1.030     pH 5.5  5.0 - 8.0     Glucose, UA NEGATIVE  NEGATIVE (mg/dL)    Hgb urine dipstick NEGATIVE  NEGATIVE     Bilirubin Urine NEGATIVE  NEGATIVE     Ketones, ur NEGATIVE  NEGATIVE (mg/dL)    Protein, ur NEGATIVE  NEGATIVE (mg/dL)    Urobilinogen, UA 0.2  0.0 - 1.0  (mg/dL)    Nitrite NEGATIVE  NEGATIVE     Leukocytes, UA NEGATIVE  NEGATIVE  MICROSCOPIC NOT DONE ON URINES WITH NEGATIVE PROTEIN, BLOOD, LEUKOCYTES, NITRITE, OR GLUCOSE <1000 mg/dL.   Ct Abdomen Pelvis Wo Contrast  08/11/2011  *RADIOLOGY REPORT*  Clinical Data: Abdominal pain, elevated white count.  CT ABDOMEN AND PELVIS WITHOUT CONTRAST  Technique:  Multidetector CT imaging of the abdomen and pelvis was performed following the standard protocol without intravenous contrast.  Comparison: 06/01/2010  Findings: Bibasilar atelectasis.  No effusions.  Heart is mildly enlarged.  There is free air and free of oral contrast material  noted within the abdomen.  This is likely arising from the distal stomach or proximal duodenum. Possible slight on image 27 of series 2.  No evidence of bowel obstruction.  Descending colonic and sigmoid diverticulosis.  No active diverticulitis.  Small bowel is decompressed.  Urinary bladder mildly distended.  Prostate enlarged.  Calcifications in the liver compatible with old granulomas disease. Prior splenectomy.  Pancreas, adrenals unremarkable.  Small low- density lesions noted  within the kidneys compatible with small cysts.  No hydronephrosis.  Aorta is normal caliber.  Mildly prominent gastrohepatic and celiac axis lymph nodes, slightly more prominent when compared to prior study.  No acute bony abnormality.  Degenerative changes in the thoracolumbar spine.  IMPRESSION: Free air and free oral contrast within the upper abdomen, likely related to perforation in the distal stomach or proximal duodenum.  Mildly enlarged gastrohepatic and celiac axis lymph nodes. These are slightly more prominent when compared to prior study.  These can be followed with repeat CT in 3-6 months.  Descending and sigmoid diverticulosis.  Critical Value/emergent results were called by telephone at the time of interpretation on 08/11/2011  at 12:37 p.m.  to  Dr. Weldon Inches, who verbally acknowledged these  results.  Original Report Authenticated By: Cyndie Chime, M.D.   Dg Abd Acute W/chest  08/11/2011  *RADIOLOGY REPORT*  Clinical Data: 71 year old male with abdominal pain.  Vomiting. Prior abdominal surgery. History of lymphoma.  ACUTE ABDOMEN SERIES (ABDOMEN 2 VIEW & CHEST 1 VIEW)  Comparison: 06/01/2010 and earlier.  Findings: Stable lung volumes.  No pneumothorax or pneumoperitoneum.  Cardiac size and mediastinal contours are within normal limits.  Visualized tracheal air column is within normal limits.  Minimal opacity at the right lung base most resembles atelectasis.  No effusion.  Stool in the ascending colon. Nonobstructed bowel gas pattern. Stable scoliosis.  Left hemi pelvis phlebolith is stable.  IMPRESSION: 1. Nonobstructed bowel gas pattern, no free air. 2.  Suspect atelectasis at the right lung base, otherwise no acute cardiopulmonary abnormality.  Original Report Authenticated By: Harley Hallmark, M.D.       Assessment/Plan 1. Perforated viscus, likely secondary to perforated stomach vs duodenum 2. DM 3. HTN 4. H/o non-Hodgkins lymphoma 5. Hyperkalemia 6. Hypothyroidism 7. Acute renal insufficiency  Plan: The patient will be admitted and taken emergently to the operating room for an exploratory laparotomy for repair of a perforated viscus.  He will be started on his beta blocker (if BP adequate) and synthroid postoperatively.  I have d/w the patient and his wife about the operation including possible oversew with patching vs resection.  We also discussed post op complications including but not limited to bleeding, infection, fluid collection formation, wound infection, or leak at perforation site.  They understand and are agreeable to proceed.  All questions were answered.  I will call Dr. Gustavo Lah office and let them know about the patient's condition. Mariabella Nilsen E 08/11/2011, 1:37 PM

## 2011-08-11 NOTE — Anesthesia Preprocedure Evaluation (Addendum)
Anesthesia Evaluation  Patient identified by MRN, date of birth, ID band Patient awake    Reviewed: Allergy & Precautions, H&P , NPO status , Patient's Chart, lab work & pertinent test results, reviewed documented beta blocker date and time   History of Anesthesia Complications Negative for: history of anesthetic complications  Airway Mallampati: II TM Distance: >3 FB Neck ROM: Full    Dental  (+) Poor Dentition and Dental Advisory Given   Pulmonary  H/o malignant effusion with non-Hodgkin's lymphoma  breath sounds clear to auscultation  Pulmonary exam normal       Cardiovascular hypertension, Pt. on medications and Pt. on home beta blockers Rhythm:Regular Rate:Normal     Neuro/Psych negative neurological ROS  negative psych ROS   GI/Hepatic Neg liver ROS, PUD (perforated ulcer today), GERD-  Controlled,Perforated viscus   Endo/Other  Diabetes mellitus- (glu 173), Type 2, Oral Hypoglycemic Agents  Renal/GU Renal InsufficiencyRenal disease (creat 1.75, K+ 4.7 )     Musculoskeletal   Abdominal   Peds  Hematology H/o lymphoma- non Hodgkin's, Rx chemo, splenectomy   Anesthesia Other Findings   Reproductive/Obstetrics                          Anesthesia Physical Anesthesia Plan  ASA: III and Emergent  Anesthesia Plan: General   Post-op Pain Management:    Induction: Intravenous  Airway Management Planned: Oral ETT  Additional Equipment:   Intra-op Plan:   Post-operative Plan: Possible Post-op intubation/ventilation  Informed Consent: I have reviewed the patients History and Physical, chart, labs and discussed the procedure including the risks, benefits and alternatives for the proposed anesthesia with the patient or authorized representative who has indicated his/her understanding and acceptance.   Dental advisory given  Plan Discussed with: Surgeon and CRNA  Anesthesia Plan  Comments: (Plan routine monitors, GETA with rapid sequence induction)        Anesthesia Quick Evaluation

## 2011-08-11 NOTE — ED Notes (Signed)
Pt arrives via EMS c/o severe chest pain substernal, described as tightness for approx the last hour PTA. EMS gave 324 ASA, 1 sL nitro with no change in pain. Pt restless moaning in bed. 18g rfa, 18 lac

## 2011-08-11 NOTE — ED Provider Notes (Signed)
History     CSN: [id number]  Arrival date & time 08/11/11  0744   First MD Initiated Contact with Patient 08/11/11 0757      Chief Complaint  Patient presents with  . Chest Pain    (Consider location/radiation/quality/duration/timing/severity/associated sxs/prior treatment) The history is provided by the patient. The history is limited by the condition of the patient.   the patient is a 71 year old, male, with a history of diabetes, and splenectomy, do to treatment for non-Hodgkin's lymphoma.  He states he has also had a kidney stone in the past and has hypertension.  He complains of progressive abdominal pain for the past 2-3 days.  With nausea and vomiting.  He has a nonproductive cough.  He denies fevers, or chills.  He denies diarrhea.  He has not had hematuria.  He denies a history of peptic ulcer disease, and alcohol.  Use.  Level V caveat applies for severe pain and urgent need for intervention  Past Medical History  Diagnosis Date  . Cancer     Non Hodgkins Lymphoma  . Hernia 1949  . Diabetes mellitus 5-6 yrs    T2DM  . Hypertension   . Hyperkalemia   . 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 Hypotestosteronism 03/29/2011    Past Surgical History  Procedure Date  . Hernia repair 1949  . Splenectomy, total 2009  . Appendectomy 1948  . Pancreatectomy 2009    Partial w/ splenectomy    No family history on file.  History  Substance Use Topics  . Smoking status: Never Smoker   . Smokeless tobacco: Never Used  . Alcohol Use: No      Review of Systems  Constitutional: Negative for fever and chills.  Respiratory: Positive for cough. Negative for shortness of breath.   Cardiovascular: Negative for chest pain.  Gastrointestinal: Positive for nausea, vomiting and abdominal pain. Negative for diarrhea and blood in stool.  Genitourinary: Negative for dysuria and hematuria.  Neurological: Negative for headaches.  Psychiatric/Behavioral: Negative for  confusion.  All other systems reviewed and are negative.    Allergies  Review of patient's allergies indicates no known allergies.  Home Medications   Current Outpatient Rx  Name Route Sig Dispense Refill  . ALLOPURINOL 150 MG HALF TABLET Oral Take 150 mg by mouth daily.      Marland Kitchen AMLODIPINE BESYLATE 10 MG PO TABS Oral Take 10 mg by mouth daily.      . COLCHICINE 0.6 MG PO TABS Oral Take 0.6 mg by mouth every other day.     Marland Kitchen DOXAZOSIN MESYLATE 2 MG PO TABS Oral Take 2 mg by mouth 3 (three) times daily.      . FUROSEMIDE 40 MG PO TABS Oral Take 80 mg by mouth daily.     Marland Kitchen LEVOTHYROXINE SODIUM 150 MCG PO TABS Oral Take 150 mcg by mouth daily.      Marland Kitchen SITAGLIPTIN PHOSPHATE 50 MG PO TABS Oral Take 50 mg by mouth daily.    . ATENOLOL 12.5 MG HALF TABLET Oral Take 12.5 mg by mouth daily.        BP 129/61  Pulse 70  Temp(Src) 98.1 F (36.7 C) (Oral)  Resp 14  SpO2 94%  Physical Exam  Vitals reviewed. Constitutional: He is oriented to person, place, and time. He appears well-developed and well-nourished. He appears distressed.       Moaning in pain  HENT:  Head: Normocephalic and atraumatic.  Eyes: Conjunctivae are normal.  Neck: Normal  range of motion. Neck supple.  Cardiovascular: Normal rate.   No murmur heard. Pulmonary/Chest: Effort normal. No respiratory distress.  Abdominal: Soft. Bowel sounds are normal. There is tenderness. There is guarding. There is no rebound.       Epigastric tenderness, with guarding.  Decreased bowel sounds  Musculoskeletal: Normal range of motion.  Neurological: He is alert and oriented to person, place, and time.  Skin: Skin is warm and dry.  Psychiatric: He has a normal mood and affect. Judgment and thought content normal.    ED Course  Procedures (including critical care time) 71 year old male, with history of abdominal surgery, presents with progressive epigastric pain, nausea, and vomiting, and nonproductive cough.  We will establish an IV  and give him analgesics and antiemetics, and perform an acute abdominal series to look for evidence of pneumonia or bowel obstruction.  Laboratory testing, will be performed, including urinalysis, and blood tests.  Depending on the results of his tests he may need a CAT scan as well   Labs Reviewed  COMPREHENSIVE METABOLIC PANEL  LIPASE, BLOOD  CBC  DIFFERENTIAL  URINALYSIS, ROUTINE W REFLEX MICROSCOPIC   No results found.   No diagnosis found.  ED ECG REPORT   Date: 08/11/2011  EKG Time: 8:11 AM  Rate: 68  Rhythm: normal sinus rhythm,    Axis: right  Intervals:none  ST&T Change: none  Narrative Interpretation: nsr with rad     12:47 PM Spoke with the radiologist.  + perf ulcer.   Ordered abxs.    Explained findings to pt and wife.  Pain free now. Spoke with surgery. They will come see him  CRITICAL CARE Performed by: Kaliegh Willadsen P   Total critical care time: 30 min  Critical care time was exclusive of separately billable procedures and treating other patients.  Critical care was necessary to treat or prevent imminent or life-threatening deterioration.  Critical care was time spent personally by me on the following activities: development of treatment plan with patient and/or surrogate as well as nursing, discussions with consultants, evaluation of patient's response to treatment, examination of patient, obtaining history from patient or surrogate, ordering and performing treatments and interventions, ordering and review of laboratory studies, ordering and review of radiographic studies, pulse oximetry and re-evaluation of patient's condition.        MDM  Abdominal pain due to perforated ulcer.   leukoctytosis Pain free after analgesics in ed.  Not unstable Surgery to take to or.         Cheri Guppy, MD 08/11/11 1249

## 2011-08-12 LAB — GLUCOSE, CAPILLARY
Glucose-Capillary: 90 mg/dL (ref 70–99)
Glucose-Capillary: 105 mg/dL — ABNORMAL HIGH (ref 70–99)
Glucose-Capillary: 116 mg/dL — ABNORMAL HIGH (ref 70–99)

## 2011-08-12 LAB — CBC
HCT: 34.5 % — ABNORMAL LOW (ref 39.0–52.0)
Hemoglobin: 11.8 g/dL — ABNORMAL LOW (ref 13.0–17.0)
MCV: 97.5 fL (ref 78.0–100.0)
RBC: 3.54 MIL/uL — ABNORMAL LOW (ref 4.22–5.81)
WBC: 34.6 10*3/uL — ABNORMAL HIGH (ref 4.0–10.5)

## 2011-08-12 LAB — BASIC METABOLIC PANEL
CO2: 21 mEq/L (ref 19–32)
Chloride: 109 mEq/L (ref 96–112)
Creatinine, Ser: 1.97 mg/dL — ABNORMAL HIGH (ref 0.50–1.35)
Potassium: 5.3 mEq/L — ABNORMAL HIGH (ref 3.5–5.1)

## 2011-08-12 MED ORDER — INSULIN ASPART 100 UNIT/ML ~~LOC~~ SOLN
0.0000 [IU] | Freq: Three times a day (TID) | SUBCUTANEOUS | Status: DC
  Administered 2011-08-16: 3 [IU] via SUBCUTANEOUS
  Administered 2011-08-16: 2 [IU] via SUBCUTANEOUS
  Administered 2011-08-17 – 2011-08-18 (×3): 1 [IU] via SUBCUTANEOUS
  Administered 2011-08-18 – 2011-08-19 (×2): 2 [IU] via SUBCUTANEOUS
  Administered 2011-08-19: 1 [IU] via SUBCUTANEOUS

## 2011-08-12 MED ORDER — FUROSEMIDE 10 MG/ML IJ SOLN
40.0000 mg | Freq: Every day | INTRAMUSCULAR | Status: DC
Start: 2011-08-13 — End: 2011-08-13
  Filled 2011-08-12: qty 4

## 2011-08-12 MED ORDER — FUROSEMIDE 10 MG/ML IJ SOLN
20.0000 mg | Freq: Once | INTRAMUSCULAR | Status: AC
  Administered 2011-08-12: 20 mg via INTRAVENOUS
  Filled 2011-08-12 (×2): qty 2

## 2011-08-12 MED ORDER — HYDROMORPHONE 0.3 MG/ML IV SOLN
INTRAVENOUS | Status: AC
  Administered 2011-08-12: 12:00:00
  Filled 2011-08-12: qty 25

## 2011-08-12 MED ORDER — CHLORHEXIDINE GLUCONATE 0.12 % MT SOLN
15.0000 mL | Freq: Two times a day (BID) | OROMUCOSAL | Status: DC
  Administered 2011-08-12 – 2011-08-18 (×12): 15 mL via OROMUCOSAL
  Filled 2011-08-12 (×12): qty 15

## 2011-08-12 MED ORDER — BIOTENE DRY MOUTH MT LIQD
15.0000 mL | Freq: Two times a day (BID) | OROMUCOSAL | Status: DC
  Administered 2011-08-12 – 2011-08-18 (×13): 15 mL via OROMUCOSAL

## 2011-08-12 NOTE — Progress Notes (Signed)
1 Day Post-Op  Subjective: Pain under control.   Objective: Vital signs in last 24 hours: Temp:  [97.7 F (36.5 C)-98.8 F (37.1 C)] 98.6 F (37 C) (04/13 0811) Pulse Rate:  [69-93] 81  (04/13 0700) Resp:  [13-24] 18  (04/13 0700) BP: (107-147)/(51-80) 127/59 mmHg (04/13 0700) SpO2:  [94 %-99 %] 96 % (04/13 0700) Weight:  [83.2 kg (183 lb 6.8 oz)] 83.2 kg (183 lb 6.8 oz) (04/13 0400)    Intake/Output from previous day: 04/12 0701 - 04/13 0700 In: 3033.3 [I.V.:2776.3; NG/GT:120; IV Piggyback:137] Out: 4318 [Urine:3810; Emesis/NG output:300; Drains:158; Blood:50] Intake/Output this shift: Total I/O In: 47.8 [I.V.:5.3; NG/GT:30; IV Piggyback:12.5] Out: 150 [Urine:150]  General appearance: alert, cooperative, appears stated age and mild distress Resp: clear to auscultation bilaterally Cardio: regular rate and rhythm GI: Quiet, soft, operative dressing intact and without significant drainage.   Lab Results:   [facility] 08/12/11 0635 08/11/11 0849  WBC 34.6* 16.6*  HGB 11.8* 11.9*  HCT 34.5* 35.1*  PLT 222 225   BMET  Basename 08/12/11 0635 08/11/11 0849  NA 141 138  K 5.3* 4.7  CL 109 105  CO2 21 20  GLUCOSE 90 173*  BUN 53* 57*  CREATININE 1.97* 1.75*  CALCIUM 9.2 9.4   PT/INR No results found for this basename: LABPROT:2,INR:2 in the last 72 hours ABG No results found for this basename: PHART:2,PCO2:2,PO2:2,HCO3:2 in the last 72 hours  Studies/Results: Ct Abdomen Pelvis Wo Contrast  08/11/2011  *RADIOLOGY REPORT*  Clinical Data: Abdominal pain, elevated white count.  CT ABDOMEN AND PELVIS WITHOUT CONTRAST  Technique:  Multidetector CT imaging of the abdomen and pelvis was performed following the standard protocol without intravenous contrast.  Comparison: 06/01/2010  Findings: Bibasilar atelectasis.  No effusions.  Heart is mildly enlarged.  There is free air and free of oral contrast material noted within the abdomen.  This is likely arising from the distal  stomach or proximal duodenum. Possible slight on image 27 of series 2.  No evidence of bowel obstruction.  Descending colonic and sigmoid diverticulosis.  No active diverticulitis.  Small bowel is decompressed.  Urinary bladder mildly distended.  Prostate enlarged.  Calcifications in the liver compatible with old granulomas disease. Prior splenectomy.  Pancreas, adrenals unremarkable.  Small low- density lesions noted  within the kidneys compatible with small cysts.  No hydronephrosis.  Aorta is normal caliber.  Mildly prominent gastrohepatic and celiac axis lymph nodes, slightly more prominent when compared to prior study.  No acute bony abnormality.  Degenerative changes in the thoracolumbar spine.  IMPRESSION: Free air and free oral contrast within the upper abdomen, likely related to perforation in the distal stomach or proximal duodenum.  Mildly enlarged gastrohepatic and celiac axis lymph nodes. These are slightly more prominent when compared to prior study.  These can be followed with repeat CT in 3-6 months.  Descending and sigmoid diverticulosis.  Critical Value/emergent results were called by telephone at the time of interpretation on 08/11/2011  at 12:37 p.m.  to  Dr. Weldon Inches, who verbally acknowledged these results.  Original Report Authenticated By: Cyndie Chime, M.D.   Dg Abd Acute W/chest  08/11/2011  *RADIOLOGY REPORT*  Clinical Data: 71 year old male with abdominal pain.  Vomiting. Prior abdominal surgery. History of lymphoma.  ACUTE ABDOMEN SERIES (ABDOMEN 2 VIEW & CHEST 1 VIEW)  Comparison: 06/01/2010 and earlier.  Findings: Stable lung volumes.  No pneumothorax or pneumoperitoneum.  Cardiac size and mediastinal contours are within normal limits.  Visualized tracheal  air column is within normal limits.  Minimal opacity at the right lung base most resembles atelectasis.  No effusion.  Stool in the ascending colon. Nonobstructed bowel gas pattern. Stable scoliosis.  Left hemi pelvis  phlebolith is stable.  IMPRESSION: 1. Nonobstructed bowel gas pattern, no free air. 2.  Suspect atelectasis at the right lung base, otherwise no acute cardiopulmonary abnormality.  Original Report Authenticated By: Harley Hallmark, M.D.    Anti-infectives: Anti-infectives     Start     Dose/Rate Route Frequency Ordered Stop   08/11/11 1835   piperacillin-tazobactam (ZOSYN) IVPB 3.375 g        3.375 g 12.5 mL/hr over 240 Minutes Intravenous 3 times per day 08/11/11 1737     08/11/11 1245   cefoTAXime (CLAFORAN) 2 g in dextrose 5 % 50 mL IVPB        2 g 100 mL/hr over 30 Minutes Intravenous  Once 08/11/11 1241 08/11/11 1402   08/11/11 1245   clindamycin (CLEOCIN) IVPB 600 mg        600 mg 100 mL/hr over 30 Minutes Intravenous  Once 08/11/11 1241 08/11/11 1340          Assessment/Plan: s/p Procedure(s) (LRB): EXPLORATORY LAPAROTOMY (N/A) REPAIR OF PERFORATED ULCER () Perforated Duodenal ulcer- S/P omental patch repair- POD#1- continue NGT, npo, continues Zosyn DM- SSI HTN-lopressor IV Hypothyroidism- synthroid IV FEN- Npo VTE- SCD's, SQ Heparin DISPO- continue NGT and npo  Okay for transfer to floor  LOS: 1 day   RAYBURN,SHAWN,PA-C Pager [phone] General Trauma Pager [phone]

## 2011-08-12 NOTE — Progress Notes (Signed)
Received from 2300 via recliner accpd by RN and family.

## 2011-08-12 NOTE — Progress Notes (Signed)
Up in chair Watch renal function and K+ - takes lasix 80mg  daily per renal to manage K= so will increase to 40mg IV daily Patient examined and I agree with the assessment and plan  Violeta Gelinas, MD, MPH, FACS Pager: [phone]  08/12/2011 12:42 PM

## 2011-08-13 DIAGNOSIS — E875 Hyperkalemia: Secondary | ICD-10-CM

## 2011-08-13 DIAGNOSIS — N289 Disorder of kidney and ureter, unspecified: Secondary | ICD-10-CM | POA: Diagnosis not present

## 2011-08-13 LAB — BASIC METABOLIC PANEL
Chloride: 108 mEq/L (ref 96–112)
GFR calc Af Amer: 33 mL/min — ABNORMAL LOW (ref >90)
GFR calc non Af Amer: 28 mL/min — ABNORMAL LOW (ref >90)
Potassium: 4.6 mEq/L (ref 3.5–5.1)
Sodium: 142 mEq/L (ref 135–145)

## 2011-08-13 LAB — GLUCOSE, CAPILLARY: Glucose-Capillary: 96 mg/dL (ref 70–99)

## 2011-08-13 LAB — CBC
MCHC: 34.3 g/dL (ref 30.0–36.0)
Platelets: 216 10*3/uL (ref 150–400)
RDW: 15.8 % — ABNORMAL HIGH (ref 11.5–15.5)
WBC: 23.4 10*3/uL — ABNORMAL HIGH (ref 4.0–10.5)

## 2011-08-13 MED ORDER — PHENOL 1.4 % MT LIQD
1.0000 | OROMUCOSAL | Status: DC | PRN
  Filled 2011-08-13: qty 177

## 2011-08-13 NOTE — Progress Notes (Addendum)
2 Days Post-Op  Subjective: Patient states that he feels better than yesterday - no flatus  Objective: Vital signs in last 24 hours: Temp:  [97.6 F (36.4 C)-98.8 F (37.1 C)] 98.2 F (36.8 C) (04/14 0614) Pulse Rate:  [82-107] 88  (04/14 0614) Resp:  [15-47] 15  (04/14 0803) BP: (121-142)/(52-75) 121/62 mmHg (04/14 0614) SpO2:  [94 %-99 %] 96 % (04/14 0803)    Intake/Output from previous day: 04/13 0701 - 04/14 0700 In: 656.3 [I.V.:509.3; NG/GT:60; IV Piggyback:87] Out: 4365 [Urine:4000; Emesis/NG output:200; Drains:165] Intake/Output this shift:    General appearance: alert, cooperative and no distress GI: soft, minimally distended; drain with serosanguinous output Incision - wicks in place; minimal drainage  Lab Results:   Basename 08/13/11 0630 08/12/11 0635  WBC 23.4* 34.6*  HGB 11.5* 11.8*  HCT 33.5* 34.5*  PLT 216 222   BMET  Basename 08/13/11 0630 08/12/11 0635  NA 142 141  K 4.6 5.3*  CL 108 109  CO2 23 21  GLUCOSE 82 90  BUN 58* 53*  CREATININE 2.20* 1.97*  CALCIUM 9.7 9.2   PT/INR No results found for this basename: LABPROT:2,INR:2 in the last 72 hours ABG No results found for this basename: PHART:2,PCO2:2,PO2:2,HCO3:2 in the last 72 hours  Studies/Results: Ct Abdomen Pelvis Wo Contrast  08/11/2011  *RADIOLOGY REPORT*  Clinical Data: Abdominal pain, elevated white count.  CT ABDOMEN AND PELVIS WITHOUT CONTRAST  Technique:  Multidetector CT imaging of the abdomen and pelvis was performed following the standard protocol without intravenous contrast.  Comparison: 06/01/2010  Findings: Bibasilar atelectasis.  No effusions.  Heart is mildly enlarged.  There is free air and free of oral contrast material noted within the abdomen.  This is likely arising from the distal stomach or proximal duodenum. Possible slight on image 27 of series 2.  No evidence of bowel obstruction.  Descending colonic and sigmoid diverticulosis.  No active diverticulitis.  Small  bowel is decompressed.  Urinary bladder mildly distended.  Prostate enlarged.  Calcifications in the liver compatible with old granulomas disease. Prior splenectomy.  Pancreas, adrenals unremarkable.  Small low- density lesions noted  within the kidneys compatible with small cysts.  No hydronephrosis.  Aorta is normal caliber.  Mildly prominent gastrohepatic and celiac axis lymph nodes, slightly more prominent when compared to prior study.  No acute bony abnormality.  Degenerative changes in the thoracolumbar spine.  IMPRESSION: Free air and free oral contrast within the upper abdomen, likely related to perforation in the distal stomach or proximal duodenum.  Mildly enlarged gastrohepatic and celiac axis lymph nodes. These are slightly more prominent when compared to prior study.  These can be followed with repeat CT in 3-6 months.  Descending and sigmoid diverticulosis.  Critical Value/emergent results were called by telephone at the time of interpretation on 08/11/2011  at 12:37 p.m.  to  Dr. Weldon Inches, who verbally acknowledged these results.  Original Report Authenticated By: Cyndie Chime, M.D.    Anti-infectives: Anti-infectives     Start     Dose/Rate Route Frequency Ordered Stop   08/11/11 1835   piperacillin-tazobactam (ZOSYN) IVPB 3.375 g        3.375 g 12.5 mL/hr over 240 Minutes Intravenous 3 times per day 08/11/11 1737     08/11/11 1245   cefoTAXime (CLAFORAN) 2 g in dextrose 5 % 50 mL IVPB        2 g 100 mL/hr over 30 Minutes Intravenous  Once 08/11/11 1241 08/11/11 1402   08/11/11  1245   clindamycin (CLEOCIN) IVPB 600 mg        600 mg 100 mL/hr over 30 Minutes Intravenous  Once 08/11/11 1241 08/11/11 1340          Assessment/Plan: s/p Procedure(s) (LRB): EXPLORATORY LAPAROTOMY (N/A) REPAIR OF PERFORATED ULCER () Awaiting bowel function; continue NG tube until bowel function returns. Creatinine elevated - will hold Lasix for now; K is stable.  If Creatinine continues to  rise, will consult Dr. Caryn Section  LOS: 2 days    Ora Bollig K. 08/13/2011

## 2011-08-14 ENCOUNTER — Encounter (HOSPITAL_COMMUNITY): Payer: Self-pay | Admitting: General Surgery

## 2011-08-14 ENCOUNTER — Ambulatory Visit: Payer: Medicare Other | Admitting: *Deleted

## 2011-08-14 LAB — BASIC METABOLIC PANEL
CO2: 22 mEq/L (ref 19–32)
Calcium: 9.5 mg/dL (ref 8.4–10.5)
GFR calc non Af Amer: 33 mL/min — ABNORMAL LOW (ref >90)
Potassium: 4.2 mEq/L (ref 3.5–5.1)
Sodium: 143 mEq/L (ref 135–145)

## 2011-08-14 LAB — CBC
HCT: 32.7 % — ABNORMAL LOW (ref 39.0–52.0)
Hemoglobin: 10.9 g/dL — ABNORMAL LOW (ref 13.0–17.0)
MCHC: 33.3 g/dL (ref 30.0–36.0)
MCV: 99.4 fL (ref 78.0–100.0)

## 2011-08-14 LAB — GLUCOSE, CAPILLARY
Glucose-Capillary: 73 mg/dL (ref 70–99)
Glucose-Capillary: 76 mg/dL (ref 70–99)
Glucose-Capillary: 79 mg/dL (ref 70–99)
Glucose-Capillary: 86 mg/dL (ref 70–99)
Glucose-Capillary: 88 mg/dL (ref 70–99)

## 2011-08-14 MED ORDER — HYDROMORPHONE 0.3 MG/ML IV SOLN
INTRAVENOUS | Status: AC
  Filled 2011-08-14: qty 25

## 2011-08-14 MED FILL — Hydromorphone HCl Inj 1 MG/ML: INTRAMUSCULAR | Qty: 1 | Status: AC

## 2011-08-14 NOTE — Progress Notes (Signed)
3 Days Post-Op  Subjective: Patient denies nausea, emesis. Minimal pain, minimal use of PCA. Wants to have something to sip besides ice chips.   Objective: Vital signs in last 24 hours: Temp:  [97.6 F (36.4 C)-98.4 F (36.9 C)] 97.7 F (36.5 C) (04/15 0505) Pulse Rate:  [73-89] 76  (04/15 0505) Resp:  [14-22] 16  (04/15 0753) BP: (128-156)/(58-70) 156/70 mmHg (04/15 0505) SpO2:  [97 %-98 %] 98 % (04/15 0753)    Intake/Output from previous day: 04/14 0701 - 04/15 0700 In: 3191 [P.O.:240; I.V.:2750; IV Piggyback:200] Out: 5000 [Urine:4250; Emesis/NG output:600; Drains:150] Intake/Output this shift:    General appearance: alert, cooperative and no distress GI: soft, minimally distended; drain with serosanguinous output; trace bowel sounds.  Incision - wicks in place; minimal drainage; bandage c/d/i LE: SCDs in place. Minimal pedal edema.   Lab Results:   Basename 08/14/11 0643 08/13/11 0630  WBC 15.9* 23.4*  HGB 10.9* 11.5*  HCT 32.7* 33.5*  PLT 208 216   BMET  Basename 08/14/11 0643 08/13/11 0630  NA 143 142  K 4.2 4.6  CL 111 108  CO2 22 23  GLUCOSE 85 82  BUN 55* 58*  CREATININE 1.96* 2.20*  CALCIUM 9.5 9.7   PT/INR No results found for this basename: LABPROT:2,INR:2 in the last 72 hours ABG No results found for this basename: PHART:2,PCO2:2,PO2:2,HCO3:2 in the last 72 hours  Studies/Results: No results found.  Anti-infectives: Anti-infectives     Start     Dose/Rate Route Frequency Ordered Stop   08/11/11 1835   piperacillin-tazobactam (ZOSYN) IVPB 3.375 g        3.375 g 12.5 mL/hr over 240 Minutes Intravenous 3 times per day 08/11/11 1737     08/11/11 1245   cefoTAXime (CLAFORAN) 2 g in dextrose 5 % 50 mL IVPB        2 g 100 mL/hr over 30 Minutes Intravenous  Once 08/11/11 1241 08/11/11 1402   08/11/11 1245   clindamycin (CLEOCIN) IVPB 600 mg        600 mg 100 mL/hr over 30 Minutes Intravenous  Once 08/11/11 1241 08/11/11 1340             . antiseptic oral rinse  15 mL Mouth Rinse q12n4p  . chlorhexidine  15 mL Mouth Rinse BID  . heparin  5,000 Units Subcutaneous Q8H  . HYDROmorphone PCA 0.3 mg/mL   Intravenous Q4H  . insulin aspart  0-9 Units Subcutaneous TID WC  . levothyroxine  76 mcg Intravenous Daily  . metoprolol  5 mg Intravenous Q6H  . pantoprazole (PROTONIX) IV  40 mg Intravenous Q12H  . piperacillin-tazobactam (ZOSYN)  IV  3.375 g Intravenous Q8H  . DISCONTD: furosemide  40 mg Intravenous Daily   Assessment/Plan: s/p Procedure(s) (LRB): EXPLORATORY LAPAROTOMY (N/A) REPAIR OF PERFORATED ULCER () Hypertension Diabetes Mellitus, A1c 6.9 Post-op ileus.   1. NG tube with 600 cc output. Will continue, clamp once decreased.  2. Leukocytosis improved, afebrile. Zosyn in setting of perforation.  3. Creatinine elevated up to 2.2, now improved with holding lasix. Will consult renal if not returned to baseline 1.6-1.9. Follow BMET in am. 4. Follow daily weights while off home diuresis. Monitor for volume overload. 5. HTN-elevated above baseline, will restart home meds once tolerating orals-norvasc 10, atenolol 12.5 daily, cardura 2mg  TID and lasix 80 mg daily. 6. Pain control PCA-minimal use.    LOS: 3 days   [facility] PGY-2 08/14/2011

## 2011-08-15 DIAGNOSIS — N289 Disorder of kidney and ureter, unspecified: Secondary | ICD-10-CM

## 2011-08-15 DIAGNOSIS — K275 Chronic or unspecified peptic ulcer, site unspecified, with perforation: Secondary | ICD-10-CM

## 2011-08-15 DIAGNOSIS — E291 Testicular hypofunction: Secondary | ICD-10-CM

## 2011-08-15 DIAGNOSIS — E875 Hyperkalemia: Secondary | ICD-10-CM | POA: Diagnosis not present

## 2011-08-15 LAB — BASIC METABOLIC PANEL
BUN: 50 mg/dL — ABNORMAL HIGH (ref 6–23)
Chloride: 118 mEq/L — ABNORMAL HIGH (ref 96–112)
Creatinine, Ser: 1.71 mg/dL — ABNORMAL HIGH (ref 0.50–1.35)
GFR calc Af Amer: 45 mL/min — ABNORMAL LOW (ref >90)

## 2011-08-15 LAB — GLUCOSE, CAPILLARY
Glucose-Capillary: 100 mg/dL — ABNORMAL HIGH (ref 70–99)
Glucose-Capillary: 110 mg/dL — ABNORMAL HIGH (ref 70–99)
Glucose-Capillary: 98 mg/dL (ref 70–99)

## 2011-08-15 LAB — CBC
HCT: 35.9 % — ABNORMAL LOW (ref 39.0–52.0)
MCHC: 32.9 g/dL (ref 30.0–36.0)
RDW: 16 % — ABNORMAL HIGH (ref 11.5–15.5)

## 2011-08-15 MED ORDER — SODIUM CHLORIDE 0.45 % IV SOLN
INTRAVENOUS | Status: DC
  Administered 2011-08-15 – 2011-08-18 (×8): via INTRAVENOUS

## 2011-08-15 MED ORDER — FUROSEMIDE 10 MG/ML IJ SOLN
20.0000 mg | Freq: Every day | INTRAMUSCULAR | Status: DC
  Administered 2011-08-15 – 2011-08-17 (×3): 20 mg via INTRAVENOUS
  Filled 2011-08-15 (×4): qty 2

## 2011-08-15 NOTE — Progress Notes (Signed)
UR of chart complete.  

## 2011-08-15 NOTE — Progress Notes (Signed)
REFERRING PHYSICIAN:  Gabrielle Dare. Janee Morn, M.D.  REASON FOR CONSULTATION: 1. Exploratory laparotomy for perforated I think duodenal ulcer. 2. History of splenic marginal zone lymphoma-status post splenectomy.  HISTORY OF PRESENT ILLNESS:  Mr. Macon is a real nice 71 year old gentleman.  He is well known to me.  He has a history of splenic marginal zone lymphoma.  He underwent splenectomy back in October 2009. He received adjuvant Rituxan which was completed back in April 2010.  We have been seeing him in the office because of low testosterone levels.  He gets testosterone every 3-4 weeks.  He does have history of renal insufficiency.  He is being followed by Nephrology for this.  He was recently seen in the office and doing well.  On April 12th, he presented with increasing abdominal pain.  He had I think a little fever.  He had some nausea.  He ultimately was taken to the emergency room.  X-rays showed he had pneumoperitoneum.  He was subsequently taken to the operating room for repair of a perforated ulcer.  He got through this without too much difficulty.  I do not see any pathology report.  He had I think lab work done prior to his surgery.  His white cell count was 16.6, hemoglobin 11.9, hematocrit 35.1, platelet count 225.  He has not been taking any kind of nonsteroidals.  He has not been on any kind of steroids.  He does not really like taking medications overall.  We were just notified of him being admitted him having surgery just to make sure that nothing else was happening from a hematologic or oncologic point of view.  He did have a CT of the abdomen and pelvis done.  This did not show any obvious lymphadenopathy.  His liver looked okay.  There were some mildly prominent gastrohepatic and celiac axis nodes which were slightly more prominent than they were a year ago.  These certainly could be reactive.  Again he does have some renal insufficiency.  His BUN and  creatinine were 57 and 1.75 prior to surgery.  He currently has an NG tube in place.  He is on IV fluids.  He is still not really able to eat.  His supplemental testosterone has helped quite a bit.  His last testosterone back from 07/14/2011 was 305.  Again, we just were asked to see him so that we could make sure that nothing else was going on with him.  PAST MEDICAL HISTORY:  Remarkable for. 1. Splenic marginal zone lymphoma, status post splenectomy and     Rituxan. 2. Renal tubular acidosis. 3. Hypothyroidism.  ALLERGIES:  I think IV dye.  ADMISSION MEDICATIONS:  Allopurinol 150 mg p.o. daily, Norvasc 10 mg p.o. daily, colchicine 0.6 mg p.o. q. every other day,  Cardura 2 mg p.o. t.i.d., Lasix 80 mg p.o. daily, Synthroid 0.015 mg p.o. daily, Januvia 50 mg p.o. daily, atenolol 12.5 mg p.o. daily.  SOCIAL HISTORY:  Negative for tobacco or alcohol use.  FAMILY HISTORY:  Noncontributory.  PHYSICAL EXAM:  This is an elderly, but fairly well-nourished white gentleman in no obvious distress.  Vital signs:  Temperature 98.5 pulse 67, respiratory rate 18, blood pressure 165/66.  Head/Neck: Normocephalic, atraumatic skull.  There are no ocular or oral lesions. There are no palpable cervical or supraclavicular lymph nodes.  Lungs: Clear bilaterally.  Cardiac:  Regular rate and rhythm with normal S1, S2.  There are no murmurs, rubs, or bruits.  Abdomen:  Soft.  He  has an abdominal dressing.  Abdomen is flat.  His bowel sounds are decreased. He has no obvious fluid wave.  There is no palpable hepatomegaly. Extremities:  No clubbing, cyanosis, or edema.  LABORATORY STUDIES:  White cell count 11.6, hemoglobin 11.8, hematocrit 35.9, platelet count 238.  IMPRESSION:  Mr. Monnier is a 72 year old gentleman with a history of splenic marginal cell lymphoma.  Again this is not an issue for [place].  He is 3 years out from his therapy.  His blood counts have looked quite good.  I believe that  with recurrence, we would see his lymphocytes starting to go up again.  I do not see any real changes that need to be made with respect to his postoperative care.  With him having his spleen out, I suppose there is always a high risk of infection.  I did recommend that he do his incentive spirometer every hour.  He does have the renal insufficiency.  As such, hydration is going to be critical for him.  We will certainly help out in any way that we can.  Mr. Heroux is a real good guy.  He has done very nicely to date from his history of lymphoma.  We definitely appreciate the opportunity to have seen Mr. Topel. Again, if there is anything that we can do to help out, we certainly shall.    ______________________________ Josph Macho, M.D. PRE/MEDQ  D:  08/15/2011  T:  08/15/2011  Job:  [id number]

## 2011-08-15 NOTE — Progress Notes (Signed)
Pt unable to void.  Pt bladder scanned at 1730 for >999cc.  Pt tried to void again and only obtained 150cc.  MD notified and new order received to replace Foley catheter.  Will carry out order.  Hector Shade Bascom

## 2011-08-15 NOTE — Consult Note (Signed)
#  [id number] is dictated note.  Proverbs 3: 5-6  Pete E.

## 2011-08-15 NOTE — Progress Notes (Signed)
Patient ID: Larry [name], male   DOB: 06-Feb-1941, 71 y.o.   MRN: [id number] 4 Days Post-Op  Subjective: Pt feels ok.  Has only gotten up to a chair so far.  No flatus yet.  Objective: Vital signs in last 24 hours: Temp:  [97.2 F (36.2 C)-98.5 F (36.9 C)] 98.5 F (36.9 C) (04/16 0538) Pulse Rate:  [65-86] 67  (04/16 0538) Resp:  [13-22] 15  (04/16 0811) BP: (138-165)/(56-70) 165/66 mmHg (04/16 0538) SpO2:  [96 %-99 %] 98 % (04/16 0811) Weight:  [162 lb 9.6 oz (73.755 kg)] 162 lb 9.6 oz (73.755 kg) (04/16 0538) Last BM Date: 08/11/11  Intake/Output from previous day: 04/15 0701 - 04/16 0700 In: 2878.9 [I.V.:2875.9] Out: 4460 [Urine:3400; Emesis/NG output:900; Drains:160] Intake/Output this shift:    PE: Abd: soft, hypoactive BS, appropriately tender, NGT with bilious output.  Lab Results:   Basename 08/15/11 0524 08/14/11 0643  WBC 11.6* 15.9*  HGB 11.8* 10.9*  HCT 35.9* 32.7*  PLT 238 208   BMET  Basename 08/15/11 0524 08/14/11 0643  NA 151* 143  K 4.7 4.2  CL 118* 111  CO2 21 22  GLUCOSE 81 85  BUN 50* 55*  CREATININE 1.71* 1.96*  CALCIUM 9.6 9.5   PT/INR No results found for this basename: LABPROT:2,INR:2 in the last 72 hours CMP     Component Value Date/Time   NA 151* 08/15/2011 0524   NA 138 06/01/2010 0821   K 4.7 08/15/2011 0524   K 4.3 06/01/2010 0821   CL 118* 08/15/2011 0524   CL 100 06/01/2010 0821   CO2 21 08/15/2011 0524   CO2 28 06/01/2010 0821   GLUCOSE 81 08/15/2011 0524   GLUCOSE 198* 06/01/2010 0821   BUN 50* 08/15/2011 0524   BUN 23* 06/01/2010 0821   CREATININE 1.71* 08/15/2011 0524   CREATININE 1.5* 06/01/2010 0821   CALCIUM 9.6 08/15/2011 0524   CALCIUM 9.1 06/01/2010 0821   PROT 6.8 08/11/2011 0849   PROT 8.1 06/01/2010 0821   ALBUMIN 3.7 08/11/2011 0849   AST 23 08/11/2011 0849   AST 22 06/01/2010 0821   ALT 20 08/11/2011 0849   ALKPHOS 98 08/11/2011 0849   ALKPHOS 101* 06/01/2010 0821   BILITOT 0.6 08/11/2011 0849   BILITOT 0.90 06/01/2010 0821   GFRNONAA 38* 08/15/2011 0524   GFRAA 45* 08/15/2011 0524   Lipase     Component Value Date/Time   LIPASE 175* 08/11/2011 0849       Studies/Results: No results found.  Anti-infectives: Anti-infectives     Start     Dose/Rate Route Frequency Ordered Stop   08/11/11 1835  piperacillin-tazobactam (ZOSYN) IVPB 3.375 g       3.375 g 12.5 mL/hr over 240 Minutes Intravenous 3 times per day 08/11/11 1737     08/11/11 1245   cefoTAXime (CLAFORAN) 2 g in dextrose 5 % 50 mL IVPB        2 g 100 mL/hr over 30 Minutes Intravenous  Once 08/11/11 1241 08/11/11 1402   08/11/11 1245   clindamycin (CLEOCIN) IVPB 600 mg        600 mg 100 mL/hr over 30 Minutes Intravenous  Once 08/11/11 1241 08/11/11 1340           Assessment/Plan  1. S/p ex lap with graham patch of perf duo ulcer 2. Post op ileus 3. Hyperkalemia, on lasix  Plan: 1. Cont NGT today due to ileus, await bowel function 2. Pt needs to ambulate in the  halls 3. Will dc his foley.  He can use a urinal if he can't get up the bathroom that quickly, want to make sure he can urinate on his own and he doesn't get a UTI.   LOS: 4 days    Ota Ebersole E 08/15/2011

## 2011-08-16 LAB — BASIC METABOLIC PANEL
Calcium: 10.4 mg/dL (ref 8.4–10.5)
Creatinine, Ser: 1.64 mg/dL — ABNORMAL HIGH (ref 0.50–1.35)
GFR calc Af Amer: 47 mL/min — ABNORMAL LOW (ref >90)
GFR calc non Af Amer: 40 mL/min — ABNORMAL LOW (ref >90)

## 2011-08-16 LAB — CBC
Platelets: 232 10*3/uL (ref 150–400)
RDW: 15.8 % — ABNORMAL HIGH (ref 11.5–15.5)
WBC: 9.7 10*3/uL (ref 4.0–10.5)

## 2011-08-16 LAB — GLUCOSE, CAPILLARY: Glucose-Capillary: 84 mg/dL (ref 70–99)

## 2011-08-16 MED ORDER — AMLODIPINE BESYLATE 10 MG PO TABS
10.0000 mg | ORAL_TABLET | Freq: Every day | ORAL | Status: DC
  Administered 2011-08-16 – 2011-08-19 (×4): 10 mg via ORAL
  Filled 2011-08-16 (×5): qty 1

## 2011-08-16 MED ORDER — LEVOTHYROXINE SODIUM 150 MCG PO TABS
150.0000 ug | ORAL_TABLET | Freq: Every day | ORAL | Status: DC
  Administered 2011-08-16 – 2011-08-19 (×4): 150 ug via ORAL
  Filled 2011-08-16 (×5): qty 1

## 2011-08-16 MED ORDER — ATENOLOL 12.5 MG HALF TABLET
12.5000 mg | ORAL_TABLET | Freq: Every day | ORAL | Status: DC
  Administered 2011-08-16 – 2011-08-19 (×4): 12.5 mg via ORAL
  Filled 2011-08-16 (×4): qty 1

## 2011-08-16 MED ORDER — HYDROMORPHONE HCL PF 1 MG/ML IJ SOLN: 0.5000 mg | INTRAMUSCULAR | Status: DC | PRN

## 2011-08-16 MED ORDER — TAMSULOSIN HCL 0.4 MG PO CAPS
0.4000 mg | ORAL_CAPSULE | Freq: Every day | ORAL | Status: DC
  Administered 2011-08-16 – 2011-08-19 (×4): 0.4 mg via ORAL
  Filled 2011-08-16 (×5): qty 1

## 2011-08-16 NOTE — Progress Notes (Signed)
Patient ID: Larry [name], male   DOB: 09-Jul-1940, 71 y.o.   MRN: [id number] 5 Days Post-Op  Subjective: Pt is agitated this morning.  He's frustrated with his nurses today he says.  He is passing flatus.  Wants his ng out.  Minimal pain.  Objective: Vital signs in last 24 hours: Temp:  [97.4 F (36.3 C)-98.1 F (36.7 C)] 97.4 F (36.3 C) (04/17 0542) Pulse Rate:  [59-83] 69  (04/17 0542) Resp:  [12-19] 17  (04/17 0838) BP: (134-166)/(58-77) 166/68 mmHg (04/17 0542) SpO2:  [95 %-100 %] 96 % (04/17 0838) Last BM Date: 08/11/11  Intake/Output from previous day: 04/16 0701 - 04/17 0700 In: 3419.3 [I.V.:3069.3; IV Piggyback:350] Out: 4795 [Urine:3875; Emesis/NG output:800; Drains:120] Intake/Output this shift:    PE: Abd: soft, minimally tender, +BS, ND, JP with only serosang output.  Incision c/d/i  Lab Results:   Basename 08/16/11 0644 08/15/11 0524  WBC 9.7 11.6*  HGB 12.9* 11.8*  HCT 39.0 35.9*  PLT 232 238   BMET  Basename 08/16/11 0644 08/15/11 0524  NA 147* 151*  K 4.3 4.7  CL 111 118*  CO2 22 21  GLUCOSE 86 81  BUN 47* 50*  CREATININE 1.64* 1.71*  CALCIUM 10.4 9.6   PT/INR No results found for this basename: LABPROT:2,INR:2 in the last 72 hours CMP     Component Value Date/Time   NA 147* 08/16/2011 0644   NA 138 06/01/2010 0821   K 4.3 08/16/2011 0644   K 4.3 06/01/2010 0821   CL 111 08/16/2011 0644   CL 100 06/01/2010 0821   CO2 22 08/16/2011 0644   CO2 28 06/01/2010 0821   GLUCOSE 86 08/16/2011 0644   GLUCOSE 198* 06/01/2010 0821   BUN 47* 08/16/2011 0644   BUN 23* 06/01/2010 0821   CREATININE 1.64* 08/16/2011 0644   CREATININE 1.5* 06/01/2010 0821   CALCIUM 10.4 08/16/2011 0644   CALCIUM 9.1 06/01/2010 0821   PROT 6.8 08/11/2011 0849   PROT 8.1 06/01/2010 0821   ALBUMIN 3.7 08/11/2011 0849   AST 23 08/11/2011 0849   AST 22 06/01/2010 0821   ALT 20 08/11/2011 0849   ALKPHOS 98 08/11/2011 0849   ALKPHOS 101* 06/01/2010 0821   BILITOT 0.6 08/11/2011 0849   BILITOT 0.90  06/01/2010 0821   GFRNONAA 40* 08/16/2011 0644   GFRAA 47* 08/16/2011 0644   Lipase     Component Value Date/Time   LIPASE 175* 08/11/2011 0849       Studies/Results: No results found.  Anti-infectives: Anti-infectives     Start     Dose/Rate Route Frequency Ordered Stop   08/11/11 1835  piperacillin-tazobactam (ZOSYN) IVPB 3.375 g       3.375 g 12.5 mL/hr over 240 Minutes Intravenous 3 times per day 08/11/11 1737     08/11/11 1245   cefoTAXime (CLAFORAN) 2 g in dextrose 5 % 50 mL IVPB        2 g 100 mL/hr over 30 Minutes Intravenous  Once 08/11/11 1241 08/11/11 1402   08/11/11 1245   clindamycin (CLEOCIN) IVPB 600 mg        600 mg 100 mL/hr over 30 Minutes Intravenous  Once 08/11/11 1241 08/11/11 1340           Assessment/Plan  1. S/p graham patch for perf duo ulcer 2. Post op ileus, resolving 3. HTN 4. DM 5. Urinary retention  Plan: 1. Awaiting BMET today to review Na levels and K levels 2. Start flomax 3.  Dc ngt  4. Start clears 5. Dc PCA per pt request.    LOS: 5 days    Matia Zelada E 08/16/2011

## 2011-08-17 LAB — CBC
HCT: 38 % — ABNORMAL LOW (ref 39.0–52.0)
Hemoglobin: 13 g/dL (ref 13.0–17.0)
MCH: 33.4 pg (ref 26.0–34.0)
MCHC: 34.2 g/dL (ref 30.0–36.0)
RBC: 3.89 MIL/uL — ABNORMAL LOW (ref 4.22–5.81)

## 2011-08-17 LAB — BASIC METABOLIC PANEL
BUN: 41 mg/dL — ABNORMAL HIGH (ref 6–23)
Chloride: 105 mEq/L (ref 96–112)
GFR calc Af Amer: 49 mL/min — ABNORMAL LOW (ref >90)
Glucose, Bld: 93 mg/dL (ref 70–99)
Potassium: 4.1 mEq/L (ref 3.5–5.1)
Sodium: 140 mEq/L (ref 135–145)

## 2011-08-17 LAB — GLUCOSE, CAPILLARY: Glucose-Capillary: 114 mg/dL — ABNORMAL HIGH (ref 70–99)

## 2011-08-17 MED ORDER — OXYCODONE-ACETAMINOPHEN 5-325 MG PO TABS: 1.0000 | ORAL_TABLET | ORAL | Status: DC | PRN

## 2011-08-17 NOTE — Evaluation (Signed)
Physical Therapy Evaluation Patient Details Name: Larry [name] MRN: [id number] DOB: 07-17-1940 Today's Date: 08/17/2011  Problem List:  Patient Active Problem List  Diagnoses  . DIABETES, TYPE 2  . HYPERTENSION  . ALLERGIC RHINITIS  . PLEURAL EFFUSION, LEFT  . Splenic marginal zone b-cell lymphoma  . Hypotestosteronism  . Perforated viscus    Past Medical History:  Past Medical History  Diagnosis Date  . Cancer     Non Hodgkins Lymphoma  . Hernia 1949  . Diabetes mellitus 5-6 yrs    T2DM  . Hypertension   . Hyperkalemia   . 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 Hypotestosteronism 03/29/2011   Past Surgical History:  Past Surgical History  Procedure Date  . Splenectomy, total 2009  . Appendectomy 1948  . Pancreatectomy 2009    Partial w/ splenectomy  . Tonsillectomy   . Hernia repair 1949    unknown what type of hernia  . Laparotomy 08/11/2011    Procedure: EXPLORATORY LAPAROTOMY;  Surgeon: Liz Malady, MD;  Location: [facility] OR;  Service: General;  Laterality: N/A;    PT Assessment/Plan/Recommendation PT Assessment Clinical Impression Statement: Pt adm with perforated viscus.  Should be able to return home with wife from PT standpoint.  May need rolling walker for home. PT Recommendation/Assessment: Patient will need skilled PT in the acute care venue PT Problem List: Decreased strength;Decreased knowledge of use of DME;Decreased activity tolerance;Decreased balance;Decreased mobility PT Therapy Diagnosis : Difficulty walking;Generalized weakness PT Plan PT Frequency: Min 3X/week PT Treatment/Interventions: Gait training;DME instruction;Functional mobility training;Therapeutic activities;Therapeutic exercise;Balance training;Patient/family education PT Recommendation Follow Up Recommendations: No PT follow up Equipment Recommended: Rolling walker with 5" wheels (possibly) PT Goals  Acute Rehab PT Goals PT Goal Formulation: With patient Time  For Goal Achievement: 7 days Pt will go Supine/Side to Sit: with modified independence PT Goal: Supine/Side to Sit - Progress: Goal set today Pt will go Sit to Supine/Side: with modified independence PT Goal: Sit to Supine/Side - Progress: Goal set today Pt will go Sit to Stand: with modified independence PT Goal: Sit to Stand - Progress: Goal set today Pt will go Stand to Sit: with modified independence PT Goal: Stand to Sit - Progress: Goal set today Pt will Ambulate: 51 - 150 feet;with modified independence;with least restrictive assistive device PT Goal: Ambulate - Progress: Goal set today  PT Evaluation Precautions/Restrictions  Precautions Precautions: None Prior Functioning  Home Living Lives With: Spouse Available Help at Discharge: Family;Available 24 hours/day Type of Home: House Home Access: Stairs to enter [employer] of Steps: 3 Entrance Stairs-Rails: Right Home Layout: One level Home Adaptive Equipment: None Prior Function Level of Independence: Independent Able to Take Stairs?: Reciprically Driving: Yes Vocation: Retired 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 Overall Cognitive Status: Appears within functional limits for tasks assessed/performed Arousal/Alertness: Awake/alert Orientation Level: Appears intact for tasks assessed Behavior During Session: [facility] for tasks performed Sensation/Coordination   Extremity Assessment RLE Strength RLE Overall Strength Comments: grossly 4/5 LLE Strength LLE Overall Strength Comments: grossly 4/5 Mobility (including Balance) Transfers Transfers: Yes Sit to Stand: 5: Supervision;From chair/3-in-1;With upper extremity assist;With armrests Sit to Stand Details (indicate cue type and reason): Cues for hand placement Stand to Sit: 5: Supervision;With upper extremity assist;With armrests;To chair/3-in-1 Stand to Sit Details: controlled descent Ambulation/Gait Ambulation/Gait:  Yes Ambulation/Gait Assistance: 5: Supervision Ambulation Distance (Feet): 225 Feet Assistive device: Rolling walker Gait Pattern: Decreased stride length Gait velocity: Slow cadence  Static Standing Balance Static Standing -  Balance Support: No upper extremity supported Static Standing - Level of Assistance: 5: Stand by assistance Exercise    End of Session PT - End of Session Activity Tolerance: Patient tolerated treatment well Patient left: in chair;with call bell in reach General Behavior During Session: [facility] for tasks performed Cognition: [facility] for tasks performed  [facility] 08/17/2011, 2:15 PM  [facility] PT [phone]

## 2011-08-17 NOTE — Progress Notes (Signed)
Patient ID: Larry [name], male   DOB: Jan 06, 1941, 71 y.o.   MRN: [id number] 6 Days Post-Op  Subjective: Pt feeling much better today.  Had 2 BMs.  Tolerating clear liquids.  Objective: Vital signs in last 24 hours: Temp:  [97.3 F (36.3 C)-97.7 F (36.5 C)] 97.6 F (36.4 C) (04/18 0551) Pulse Rate:  [57-73] 61  (04/18 0551) Resp:  [18] 18  (04/18 0551) BP: (121-170)/(58-84) 170/70 mmHg (04/18 0551) SpO2:  [94 %-99 %] 97 % (04/18 0551) Last BM Date: 08/16/11  Intake/Output from previous day: 04/17 0701 - 04/18 0700 In: 2395 [P.O.:720; I.V.:1625; IV Piggyback:50] Out: 3051 [Urine:2900; Drains:150; Stool:1] Intake/Output this shift:    PE: Abd: soft, minimal tenderness, JP with serosang output still, +BS, ND  Lab Results:   Basename 08/17/11 0555 08/16/11 0644  WBC 8.4 9.7  HGB 13.0 12.9*  HCT 38.0* 39.0  PLT 246 232   BMET  Basename 08/17/11 0555 08/16/11 0644  NA 140 147*  K 4.1 4.3  CL 105 111  CO2 24 22  GLUCOSE 93 86  BUN 41* 47*  CREATININE 1.59* 1.64*  CALCIUM 9.9 10.4   PT/INR No results found for this basename: LABPROT:2,INR:2 in the last 72 hours CMP     Component Value Date/Time   NA 140 08/17/2011 0555   NA 138 06/01/2010 0821   K 4.1 08/17/2011 0555   K 4.3 06/01/2010 0821   CL 105 08/17/2011 0555   CL 100 06/01/2010 0821   CO2 24 08/17/2011 0555   CO2 28 06/01/2010 0821   GLUCOSE 93 08/17/2011 0555   GLUCOSE 198* 06/01/2010 0821   BUN 41* 08/17/2011 0555   BUN 23* 06/01/2010 0821   CREATININE 1.59* 08/17/2011 0555   CREATININE 1.5* 06/01/2010 0821   CALCIUM 9.9 08/17/2011 0555   CALCIUM 9.1 06/01/2010 0821   PROT 6.8 08/11/2011 0849   PROT 8.1 06/01/2010 0821   ALBUMIN 3.7 08/11/2011 0849   AST 23 08/11/2011 0849   AST 22 06/01/2010 0821   ALT 20 08/11/2011 0849   ALKPHOS 98 08/11/2011 0849   ALKPHOS 101* 06/01/2010 0821   BILITOT 0.6 08/11/2011 0849   BILITOT 0.90 06/01/2010 0821   GFRNONAA 42* 08/17/2011 0555   GFRAA 49* 08/17/2011 0555   Lipase     Component  Value Date/Time   LIPASE 175* 08/11/2011 0849       Studies/Results: No results found.  Anti-infectives: Anti-infectives     Start     Dose/Rate Route Frequency Ordered Stop   08/11/11 1835  piperacillin-tazobactam (ZOSYN) IVPB 3.375 g       3.375 g 12.5 mL/hr over 240 Minutes Intravenous 3 times per day 08/11/11 1737     08/11/11 1245   cefoTAXime (CLAFORAN) 2 g in dextrose 5 % 50 mL IVPB        2 g 100 mL/hr over 30 Minutes Intravenous  Once 08/11/11 1241 08/11/11 1402   08/11/11 1245   clindamycin (CLEOCIN) IVPB 600 mg        600 mg 100 mL/hr over 30 Minutes Intravenous  Once 08/11/11 1241 08/11/11 1340           Assessment/Plan  1. S/p perf duo ulcer repair 2. Post op ileus, resolved  Plan: 1. Advance to full liquids 2. Add po pain meds 3. PT/OT eval for possible home needs.   LOS: 6 days    Aubriana Ravelo E 08/17/2011

## 2011-08-18 ENCOUNTER — Ambulatory Visit: Payer: Medicare Other

## 2011-08-18 LAB — CBC
HCT: 35.4 % — ABNORMAL LOW (ref 39.0–52.0)
MCH: 33.2 pg (ref 26.0–34.0)
MCV: 95.7 fL (ref 78.0–100.0)
Platelets: 223 10*3/uL (ref 150–400)
RBC: 3.7 MIL/uL — ABNORMAL LOW (ref 4.22–5.81)

## 2011-08-18 LAB — BASIC METABOLIC PANEL
CO2: 23 mEq/L (ref 19–32)
Calcium: 9.3 mg/dL (ref 8.4–10.5)
Glucose, Bld: 94 mg/dL (ref 70–99)
Sodium: 139 mEq/L (ref 135–145)

## 2011-08-18 LAB — GLUCOSE, CAPILLARY
Glucose-Capillary: 141 mg/dL — ABNORMAL HIGH (ref 70–99)
Glucose-Capillary: 143 mg/dL — ABNORMAL HIGH (ref 70–99)

## 2011-08-18 MED ORDER — PANTOPRAZOLE SODIUM 40 MG PO TBEC
40.0000 mg | DELAYED_RELEASE_TABLET | Freq: Every day | ORAL | Status: DC
  Administered 2011-08-18 – 2011-08-19 (×2): 40 mg via ORAL
  Filled 2011-08-18 (×2): qty 1

## 2011-08-18 MED ORDER — PANTOPRAZOLE SODIUM 40 MG PO TBEC: 40.0000 mg | DELAYED_RELEASE_TABLET | Freq: Every day | ORAL | Status: DC

## 2011-08-18 MED ORDER — OXYCODONE-ACETAMINOPHEN 5-325 MG PO TABS: 1.0000 | ORAL_TABLET | ORAL | Status: AC | PRN

## 2011-08-18 MED ORDER — FUROSEMIDE 80 MG PO TABS
80.0000 mg | ORAL_TABLET | Freq: Every day | ORAL | Status: DC
  Administered 2011-08-18 – 2011-08-19 (×2): 80 mg via ORAL
  Filled 2011-08-18 (×2): qty 1

## 2011-08-18 NOTE — Discharge Summary (Signed)
Patient ID: Larry [name] MRN: [id number] DOB/AGE: 1940-11-02 71 y.o.  Admit date: 08/11/2011 Discharge date: 08/18/2011  Procedures: exploratory laparotomy with graham patch for perforated duodenal ulcer  Consults: None  Reason for Admission: this is a 71 yo male who presented to the emergency department with severe abdominal pain.  He had a workup that revealed pneumoperitoneum.  Admission Diagnoses:  1. Pneumoperitoneum, likely secondary to perforated ulcer Patient Active Problem List  Diagnoses  . DIABETES, TYPE 2  . HYPERTENSION  . ALLERGIC RHINITIS  . PLEURAL EFFUSION, LEFT  . Splenic marginal zone b-cell lymphoma  . Hypotestosteronism  .    D/C DX: Patient Active Problem List  Diagnoses  . DIABETES, TYPE 2  . HYPERTENSION  . ALLERGIC RHINITIS  . PLEURAL EFFUSION, LEFT  . Splenic marginal zone b-cell lymphoma  . Hypotestosteronism  . Duodenal ulcer, perforated, s/p Omental patch [id number]  . Urinary retention s/p foley x2  . Acute renal failure  . Chronic renal insufficiency, stage II (mild)      Hospital Course: The patient was admitted and placed on IV abx.  He was taken to the operating room where he underwent an exploratory laparotomy with repair of perforated duodenal ulcer with a graham patch.  He tolerated the procedure well.  He had an NGT in place postoperatively.  He had an ileus for 5 days postoperatively.  His NGT was able to be discontinued and he was started on clear liquids.  His diet was then advanced as tolerated.  His JP drain remained serous, and this was able to be discontinued on POD# 7.  His staples were removed at time of discharge.  The patient did have urinary retention.  He had his foley replaced twice.  He was started on flomax.  His foley was removed on POD# 7.   Urinating fine now.  We felt it was safe to D/C the patient home w close f/u   Discharge Medications: Medication List  As of 08/18/2011  8:29 AM   TAKE these medications        allopurinol 150 mg Tabs   Commonly known as: ZYLOPRIM   Take 150 mg by mouth daily.      amLODipine 10 MG tablet   Commonly known as: NORVASC   Take 10 mg by mouth daily.      atenolol 12.5 mg Tabs   Commonly known as: TENORMIN   Take 12.5 mg by mouth daily.      colchicine 0.6 MG tablet   Take 0.6 mg by mouth every other day.      doxazosin 2 MG tablet   Commonly known as: CARDURA   Take 2 mg by mouth 3 (three) times daily.      furosemide 40 MG tablet   Commonly known as: LASIX   Take 80 mg by mouth daily.      levothyroxine 150 MCG tablet   Commonly known as: SYNTHROID, LEVOTHROID   Take 150 mcg by mouth daily.      oxyCODONE-acetaminophen 5-325 MG per tablet   Commonly known as: PERCOCET   Take 1-2 tablets by mouth every 4 (four) hours as needed.      pantoprazole 40 MG tablet   Commonly known as: PROTONIX   Take 1 tablet (40 mg total) by mouth daily.      sitaGLIPtin 50 MG tablet   Commonly known as: JANUVIA   Take 50 mg by mouth daily.  Discharge Instructions: Follow-up Information    Follow up with Jearld Lesch, MD. Schedule an appointment as soon as possible for a visit in 3 weeks.      Follow up with [facility], MD. Schedule an appointment as soon as possible for a visit in 2 weeks.   Contact information:   [facility] [address] [place] [address] [phone]          Signed: Letha Cape 08/18/2011, 8:29 AM

## 2011-08-18 NOTE — Progress Notes (Signed)
Patient ID: Larry [name], male   DOB: [birth year], 71 y.o.   MRN: [id number] 7 Days Post-Op  Subjective: Pt feeling great.  Tolerating full liquids.    Objective: Vital signs in last 24 hours: Temp:  [97.2 F (36.2 C)-98.2 F (36.8 C)] 98.2 F (36.8 C) (04/19 0518) Pulse Rate:  [59-73] 59  (04/19 0518) Resp:  [16-19] 16  (04/19 0518) BP: (121-155)/(61-68) 155/68 mmHg (04/19 0518) SpO2:  [99 %-100 %] 99 % (04/19 0518) Weight:  [162 lb 12.8 oz (73.846 kg)] 162 lb 12.8 oz (73.846 kg) (04/19 0518) Last BM Date: 08/17/11  Intake/Output from previous day: 04/18 0701 - 04/19 0700 In: 3020 [P.O.:175; I.V.:2845] Out: 4100 [Urine:3825; Drains:275] Intake/Output this shift:    PE: Abd: soft, wicks in place, will dc those today, JP with serous output, +BS, minimally tender  Lab Results:   Basename 08/18/11 0705 08/17/11 0555  WBC 7.8 8.4  HGB 12.3* 13.0  HCT 35.4* 38.0*  PLT 223 246   BMET  Basename 08/17/11 0555 08/16/11 0644  NA 140 147*  K 4.1 4.3  CL 105 111  CO2 24 22  GLUCOSE 93 86  BUN 41* 47*  CREATININE 1.59* 1.64*  CALCIUM 9.9 10.4   PT/INR No results found for this basename: LABPROT:2,INR:2 in the last 72 hours CMP     Component Value Date/Time   NA 140 08/17/2011 0555   NA 138 06/01/2010 0821   K 4.1 08/17/2011 0555   K 4.3 06/01/2010 0821   CL 105 08/17/2011 0555   CL 100 06/01/2010 0821   CO2 24 08/17/2011 0555   CO2 28 06/01/2010 0821   GLUCOSE 93 08/17/2011 0555   GLUCOSE 198* 06/01/2010 0821   BUN 41* 08/17/2011 0555   BUN 23* 06/01/2010 0821   CREATININE 1.59* 08/17/2011 0555   CREATININE 1.5* 06/01/2010 0821   CALCIUM 9.9 08/17/2011 0555   CALCIUM 9.1 06/01/2010 0821   PROT 6.8 08/11/2011 0849   PROT 8.1 06/01/2010 0821   ALBUMIN 3.7 08/11/2011 0849   AST 23 08/11/2011 0849   AST 22 06/01/2010 0821   ALT 20 08/11/2011 0849   ALKPHOS 98 08/11/2011 0849   ALKPHOS 101* 06/01/2010 0821   BILITOT 0.6 08/11/2011 0849   BILITOT 0.90 06/01/2010 0821   GFRNONAA 42* 08/17/2011  0555   GFRAA 49* 08/17/2011 0555   Lipase     Component Value Date/Time   LIPASE 175* 08/11/2011 0849       Studies/Results: No results found.  Anti-infectives: Anti-infectives     Start     Dose/Rate Route Frequency Ordered Stop   08/11/11 1835  piperacillin-tazobactam (ZOSYN) IVPB 3.375 g       3.375 g 12.5 mL/hr over 240 Minutes Intravenous 3 times per day 08/11/11 1737 08/18/11 2359   08/11/11 1245   cefoTAXime (CLAFORAN) 2 g in dextrose 5 % 50 mL IVPB        2 g 100 mL/hr over 30 Minutes Intravenous  Once 08/11/11 1241 08/11/11 1402   08/11/11 1245   clindamycin (CLEOCIN) IVPB 600 mg        600 mg 100 mL/hr over 30 Minutes Intravenous  Once 08/11/11 1241 08/11/11 1340           Assessment/Plan  1. S/p perf duo ulcer with graham patch 2. Urinary retention  Plan: 1. Resume home meds 2. Dc foley and see if the addition of flomax helped him urinate.  If not he will need foley replaced with uro  follow up 3. Saline lock IV 4. Dc zosyn after today 5. Anticipate home tomorrow 6. Regular diet today   LOS: 7 days    Jushua Waltman E 08/18/2011

## 2011-08-18 NOTE — Evaluation (Signed)
Occupational Therapy Evaluation Patient Details Name: Larry [name] MRN: [id number] DOB: 10-24-1940 Today's Date: 08/18/2011 Time: 4098-1191 OT Time Calculation (min): 13 min  OT Assessment / Plan / Recommendation Clinical Impression  This 71 yo admitted with perforated viscus presents to acute OT at an Independent to Mod A level (only for LB BADLs due to abdominal wound) and has A prn at home. No further OTneeds post eval and education.    OT Assessment  Patient does not need any further OT services    Follow Up Recommendations  No OT follow up    Equipment Recommendations  None recommended by OT    Frequency      Precautions / Restrictions Precautions Precautions: None Precaution Comments: Abdominal wound Restrictions Weight Bearing Restrictions: No       ADL  Eating/Feeding: Simulated;Independent Where Assessed - Eating/Feeding: Chair Grooming: Simulated;Independent Where Assessed - Grooming: Standing at sink Upper Body Bathing: Simulated;Independent Where Assessed - Upper Body Bathing: Unsupported;Standing at sink;Sitting at sink Lower Body Bathing: Simulated;Moderate assistance Where Assessed - Lower Body Bathing: Unsupported;Sitting at sink;Standing at sink Upper Body Dressing: Simulated;Independent Where Assessed - Upper Body Dressing: Unsupported;Sitting, chair Lower Body Dressing: Simulated;Moderate assistance Where Assessed - Lower Body Dressing: Unsupported;Sit to stand from chair;Sit to stand from bed Toilet Transfer: Performed;Modified independent Toilet Transfer Method: Proofreader: Regular height toilet;Grab bars Toileting - Clothing Manipulation: Simulated;Independent Where Assessed - Toileting Clothing Manipulation: Standing Toileting - Hygiene: Simulated;Independent Where Assessed - Toileting Hygiene: Sit on 3-in-1 or toilet Tub/Shower Transfer: Not assessed Tub/Shower Transfer Method: Not assessed Equipment Used: Rolling  walker Ambulation Related to ADLs: Mod I (in hallway with RW), Independent in room (no RW)    OT Goals    Visit Information  Last OT Received On: 08/18/11 Assistance Needed: +1    Subjective Data  Subjective: I feel good Patient Stated Goal: To go home tomorrow   Prior Functioning  Home Living Lives With: Spouse Available Help at Discharge: Family;Available 24 hours/day Type of Home: House Home Access: Stairs to enter [employer] of Steps: 3 Entrance Stairs-Rails: Right Home Layout: One level Bathroom Shower/Tub: Tub/shower unit;Curtain (pt aware he does not need to get in shower with wound) Bathroom Toilet: Standard (vanity beside) Home Adaptive Equipment: None Prior Function Level of Independence: Independent Able to Take Stairs?: Reciprically Driving: Yes Vocation: Retired Musician: No difficulties Dominant Hand: Right    Cognition  Overall Cognitive Status: Appears within functional limits for tasks assessed/performed Arousal/Alertness: Awake/alert Orientation Level: Appears intact for tasks assessed Behavior During Session: [name] for tasks performed    Extremity/Trunk Assessment     Mobility Transfers Transfers: Sit to Stand;Stand to Sit Sit to Stand: 7: Independent;With upper extremity assist;With armrests;From chair/3-in-1 Stand to Sit: 7: Independent;With upper extremity assist;With armrests;To chair/3-in-1 Details for Transfer Assistance: cues for safe technique and hand placement; pt stood prior to RW being in front of him so pt standing with more caution   Exercise    Balance Static Standing Balance Static Standing - Balance Support: No upper extremity supported Static Standing - Level of Assistance: 5: Stand by assistance  End of Session OT - End of Session Equipment Utilized During Treatment:  (RW) Activity Tolerance: Patient tolerated treatment well Patient left: in chair;with call bell/phone within reach    Larry, [name] [phone] 08/18/2011, 3:11 PM

## 2011-08-18 NOTE — Progress Notes (Addendum)
Physical Therapy Treatment Patient Details Name: Larry [name] MRN: [id number] DOB: 1941/04/25 Today's Date: 08/18/2011 Time: 5784-6962 PT Time Calculation (min): 30 min  PT Assessment / Plan / Recommendation Comments on Treatment Session  Larry [name] is doing very well. Mod I for ambulation but still needing close gaurding for stair management. No HHPT needs but pt aware of option for OPPT f/u once incisions healed if pt feels he is still not completely independent.     Follow Up Recommendations  No PT follow up    Equipment Recommendations  None recommended by PT (pt has RW at home)    Frequency     Plan Discharge plan remains appropriate;Frequency remains appropriate    Precautions / Restrictions Precautions Precautions: None Restrictions Weight Bearing Restrictions: No       Mobility  Transfers Transfers: Sit to Stand;Stand to Sit Sit to Stand: 5: Supervision;With upper extremity assist;With armrests;From chair/3-in-1 Stand to Sit: 6: Modified independent (Device/Increase time);To chair/3-in-1;With armrests;With upper extremity assist Details for Transfer Assistance: cues for safe technique and hand placement; pt stood prior to RW being in front of him so pt standing with more caution Ambulation/Gait Ambulation/Gait Assistance: 6: supervision initially with cues for safe technique using RW especially with turns; graduated to during sessionModified independent (Device/Increase time) Ambulation Distance (Feet): 400 Feet Assistive device: Rolling walker Stairs: Yes Stairs Assistance: 4: Min guard Stairs Assistance Details (indicate cue type and reason): cues for technique and close gaurd as pt slow and a bit unsteady but verbalizes caution Stair Management Technique: One rail Left;Alternating pattern Number of Stairs: 4     Exercises     PT Goals Acute Rehab PT Goals PT Goal: Sit to Stand - Progress: Progressing toward goal PT Goal: Stand to Sit - Progress: Met Pt  will Ambulate: >150 feet;with modified independence;with least restrictive assistive device PT Goal: Ambulate - Progress: Met  Visit Information  Last PT Received On: 08/18/11 Assistance Needed: +1    Subjective Data  Subjective: I was ready to go home yesterday.    Cognition  Overall Cognitive Status: Appears within functional limits for tasks assessed/performed Arousal/Alertness: Awake/alert Orientation Level: Appears intact for tasks assessed Behavior During Session: [facility] for tasks performed    Balance  Static Standing Balance Static Standing - Balance Support: No upper extremity supported Static Standing - Level of Assistance: 5: Stand by assistance  End of Session PT - End of Session Equipment Utilized During Treatment: Gait belt Activity Tolerance: Patient tolerated treatment well Patient left: in chair;with call bell/phone within reach    [facility] HELEN 08/18/2011, 2:38 PM

## 2011-08-19 DIAGNOSIS — R339 Retention of urine, unspecified: Secondary | ICD-10-CM | POA: Diagnosis not present

## 2011-08-19 DIAGNOSIS — N182 Chronic kidney disease, stage 2 (mild): Secondary | ICD-10-CM | POA: Diagnosis present

## 2011-08-19 DIAGNOSIS — N179 Acute kidney failure, unspecified: Secondary | ICD-10-CM | POA: Diagnosis present

## 2011-08-19 LAB — GLUCOSE, CAPILLARY
Glucose-Capillary: 126 mg/dL — ABNORMAL HIGH (ref 70–99)
Glucose-Capillary: 176 mg/dL — ABNORMAL HIGH (ref 70–99)

## 2011-08-19 LAB — BASIC METABOLIC PANEL
BUN: 32 mg/dL — ABNORMAL HIGH (ref 6–23)
Calcium: 9.6 mg/dL (ref 8.4–10.5)
Chloride: 103 mEq/L (ref 96–112)
Creatinine, Ser: 1.61 mg/dL — ABNORMAL HIGH (ref 0.50–1.35)
GFR calc Af Amer: 48 mL/min — ABNORMAL LOW (ref >90)

## 2011-08-19 NOTE — Progress Notes (Signed)
Pt refused morning labs.  

## 2011-08-19 NOTE — Discharge Instructions (Signed)

## 2011-08-19 NOTE — Progress Notes (Signed)
Staples removed and steris applied to abdominal incision. Iv dc'd.  Prescriptions given, home care instructions gone ovefr. Home medications gone over. Signs and symptoms of infection and when to call the doctor gone over. Follow up appointments to be made. Patient verbalized understanding of all.

## 2011-08-21 ENCOUNTER — Telehealth (INDEPENDENT_AMBULATORY_CARE_PROVIDER_SITE_OTHER): Payer: Self-pay

## 2011-08-21 NOTE — Telephone Encounter (Signed)
Message copied by Ivory Broad on Mon Aug 21, 2011  9:47 AM ------      Message from: Marnette Burgess      Created: Mon Aug 21, 2011  9:32 AM      Contact: self       Patient cannot make the appointment scheduled for him, it would have to be after 3:30pm, he was instructed not to drive and his wife doesn't get off of work until 3:30pm, please call to r/s.

## 2011-08-21 NOTE — Telephone Encounter (Signed)
I called the patient back.  He had his staples removed so I gave him his May 15th appointment.  I advised that Dr Janee Morn is only here in the am on Wednesdays.  He did not want to drive until he is seen so I asked if his wife could take a day off to bring him and he will let her know.  If they can't work that out, I asked him to call me back.

## 2011-08-23 ENCOUNTER — Encounter (INDEPENDENT_AMBULATORY_CARE_PROVIDER_SITE_OTHER): Payer: Medicare Other

## 2011-09-08 ENCOUNTER — Ambulatory Visit (HOSPITAL_BASED_OUTPATIENT_CLINIC_OR_DEPARTMENT_OTHER): Payer: Medicare Other

## 2011-09-08 VITALS — BP 111/62 | HR 58 | Temp 97.2°F

## 2011-09-08 DIAGNOSIS — E291 Testicular hypofunction: Secondary | ICD-10-CM

## 2011-09-08 DIAGNOSIS — E349 Endocrine disorder, unspecified: Secondary | ICD-10-CM

## 2011-09-08 MED ORDER — TESTOSTERONE CYPIONATE 200 MG/ML IM SOLN
200.0000 mg | INTRAMUSCULAR | Status: DC
  Administered 2011-09-08: 200 mg via INTRAMUSCULAR

## 2011-09-13 ENCOUNTER — Encounter (INDEPENDENT_AMBULATORY_CARE_PROVIDER_SITE_OTHER): Payer: Self-pay | Admitting: General Surgery

## 2011-09-13 ENCOUNTER — Ambulatory Visit (INDEPENDENT_AMBULATORY_CARE_PROVIDER_SITE_OTHER): Payer: Medicare Other | Admitting: General Surgery

## 2011-09-13 VITALS — BP 118/70 | HR 60 | Temp 98.6°F | Resp 18 | Ht 68.0 in | Wt 178.0 lb

## 2011-09-13 DIAGNOSIS — K265 Chronic or unspecified duodenal ulcer with perforation: Secondary | ICD-10-CM

## 2011-09-13 NOTE — Progress Notes (Signed)
Subjective:     Patient ID: Larry [name], male   DOB: 06-Sep-1940, 71 y.o.   MRN: [id number]  HPI Patient presents status post patch repair of perforated duodenal ulcer. He has been feeling well. He is avoiding heavy lifting. He is eating and moving his bowels without difficulty.  Review of Systems     Objective:   Physical Exam Abdomen is soft and nontender. Incision is healed. No signs of infection.    Assessment:     Doing well status post repair of perforated duodenal ulcer    Plan:     Continue Nexium. I strongly recommended GI evaluation. He has never had a colonoscopy. He wishes to discuss this with his primary care physician further. We will see him back as needed.

## 2011-09-15 ENCOUNTER — Ambulatory Visit: Payer: Medicare Other

## 2011-09-18 DIAGNOSIS — E119 Type 2 diabetes mellitus without complications: Secondary | ICD-10-CM | POA: Diagnosis not present

## 2011-09-18 DIAGNOSIS — I10 Essential (primary) hypertension: Secondary | ICD-10-CM | POA: Diagnosis not present

## 2011-09-18 DIAGNOSIS — C8589 Other specified types of non-Hodgkin lymphoma, extranodal and solid organ sites: Secondary | ICD-10-CM | POA: Diagnosis not present

## 2011-09-18 DIAGNOSIS — K259 Gastric ulcer, unspecified as acute or chronic, without hemorrhage or perforation: Secondary | ICD-10-CM | POA: Diagnosis not present

## 2011-10-04 DIAGNOSIS — N2581 Secondary hyperparathyroidism of renal origin: Secondary | ICD-10-CM | POA: Diagnosis not present

## 2011-10-11 DIAGNOSIS — M109 Gout, unspecified: Secondary | ICD-10-CM | POA: Diagnosis not present

## 2011-10-11 DIAGNOSIS — I129 Hypertensive chronic kidney disease with stage 1 through stage 4 chronic kidney disease, or unspecified chronic kidney disease: Secondary | ICD-10-CM | POA: Diagnosis not present

## 2011-10-13 ENCOUNTER — Ambulatory Visit (HOSPITAL_BASED_OUTPATIENT_CLINIC_OR_DEPARTMENT_OTHER): Payer: Medicare Other | Admitting: Hematology & Oncology

## 2011-10-13 ENCOUNTER — Ambulatory Visit (HOSPITAL_BASED_OUTPATIENT_CLINIC_OR_DEPARTMENT_OTHER): Payer: Medicare Other

## 2011-10-13 ENCOUNTER — Other Ambulatory Visit (HOSPITAL_BASED_OUTPATIENT_CLINIC_OR_DEPARTMENT_OTHER): Payer: Medicare Other | Admitting: Lab

## 2011-10-13 VITALS — BP 121/58 | HR 59 | Temp 96.7°F | Ht 68.0 in | Wt 174.0 lb

## 2011-10-13 DIAGNOSIS — E291 Testicular hypofunction: Secondary | ICD-10-CM | POA: Diagnosis not present

## 2011-10-13 DIAGNOSIS — C8307 Small cell B-cell lymphoma, spleen: Secondary | ICD-10-CM

## 2011-10-13 DIAGNOSIS — E349 Endocrine disorder, unspecified: Secondary | ICD-10-CM

## 2011-10-13 DIAGNOSIS — N289 Disorder of kidney and ureter, unspecified: Secondary | ICD-10-CM | POA: Diagnosis not present

## 2011-10-13 DIAGNOSIS — D649 Anemia, unspecified: Secondary | ICD-10-CM

## 2011-10-13 DIAGNOSIS — N189 Chronic kidney disease, unspecified: Secondary | ICD-10-CM

## 2011-10-13 LAB — CBC WITH DIFFERENTIAL (CANCER CENTER ONLY)
BASO%: 0.4 % (ref 0.0–2.0)
EOS%: 8.2 % — ABNORMAL HIGH (ref 0.0–7.0)
LYMPH%: 17.2 % (ref 14.0–48.0)
MCH: 34 pg — ABNORMAL HIGH (ref 28.0–33.4)
MCV: 100 fL — ABNORMAL HIGH (ref 82–98)
MONO%: 12.3 % (ref 0.0–13.0)
Platelets: 276 10*3/uL (ref 145–400)
RDW: 15.7 % (ref 11.1–15.7)
WBC: 10.1 10*3/uL — ABNORMAL HIGH (ref 4.0–10.0)

## 2011-10-13 LAB — FERRITIN: Ferritin: 199 ng/mL (ref 22–322)

## 2011-10-13 MED ORDER — TESTOSTERONE CYPIONATE 200 MG/ML IM SOLN
200.0000 mg | INTRAMUSCULAR | Status: DC
  Administered 2011-10-13: 200 mg via INTRAMUSCULAR

## 2011-10-13 NOTE — Patient Instructions (Signed)
Testosterone injection What is this medicine? TESTOSTERONE (tes TOS ter one) is the main male hormone. It supports normal male development such as muscle growth, facial hair, and deep voice. It is used in males to treat low testosterone levels. This medicine may be used for other purposes; ask your health care provider or pharmacist if you have questions. What should I tell my health care provider before I take this medicine? They need to know if you have any of these conditions: -breast cancer -diabetes -heart disease -kidney disease -liver disease -lung disease -prostate cancer, enlargement -an unusual or allergic reaction to testosterone, other medicines, foods, dyes, or preservatives -pregnant or trying to get pregnant -breast-feeding How should I use this medicine? This medicine is for injection into a muscle. It is usually given by a health care professional in a hospital or clinic setting. Contact your pediatrician regarding the use of this medicine in children. While this medicine may be prescribed for children as young as 12 years of age for selected conditions, precautions do apply. Overdosage: If you think you have taken too much of this medicine contact a poison control center or emergency room at once. NOTE: This medicine is only for you. Do not share this medicine with others. What if I miss a dose? Try not to miss a dose. Your doctor or health care professional will tell you when your next injection is due. Notify the office if you are unable to keep an appointment. What may interact with this medicine? -medicines for diabetes -medicines that treat or prevent blood clots like warfarin -oxyphenbutazone -propranolol -steroid medicines like prednisone or cortisone This list may not describe all possible interactions. Give your health care provider a list of all the medicines, herbs, non-prescription drugs, or dietary supplements you use. Also tell them if you smoke, drink  alcohol, or use illegal drugs. Some items may interact with your medicine. What should I watch for while using this medicine? Visit your doctor or health care professional for regular checks on your progress. They will need to check the level of testosterone in your blood. This medicine may affect blood sugar levels. If you have diabetes, check with your doctor or health care professional before you change your diet or the dose of your diabetic medicine. This drug is banned from use in athletes by most athletic organizations. What side effects may I notice from receiving this medicine? Side effects that you should report to your doctor or health care professional as soon as possible: -allergic reactions like skin rash, itching or hives, swelling of the face, lips, or tongue -breast enlargement -breathing problems -changes in mood, especially anger, depression, or rage -dark urine -general ill feeling or flu-like symptoms -light-colored stools -loss of appetite, nausea -nausea, vomiting -right upper belly pain -stomach pain -swelling of ankles -too frequent or persistent erections -trouble passing urine or change in the amount of urine -unusually weak or tired -yellowing of the eyes or skin Additional side effects that can occur in women include: -deep or hoarse voice -facial hair growth -irregular menstrual periods Side effects that usually do not require medical attention (report to your doctor or health care professional if they continue or are bothersome): -acne -change in sex drive or performance -hair loss -headache This list may not describe all possible side effects. Call your doctor for medical advice about side effects. You may report side effects to FDA at 1-800-FDA-1088. Where should I keep my medicine? Keep out of the reach of children. This medicine   can be abused. Keep your medicine in a safe place to protect it from theft. Do not share this medicine with anyone.  Selling or giving away this medicine is dangerous and against the law. Store at room temperature between 20 and 25 degrees C (68 and 77 degrees F). Do not freeze. Protect from light. Follow the directions for the product you are prescribed. Throw away any unused medicine after the expiration date. NOTE: This sheet is a summary. It may not cover all possible information. If you have questions about this medicine, talk to your doctor, pharmacist, or health care provider.  2012, [employer]. (06/28/2007 4:13:46 PM) 

## 2011-10-13 NOTE — Progress Notes (Signed)
This office note has been dictated.

## 2011-10-13 NOTE — Progress Notes (Signed)
CC:   Larry [name] Section, M.D. Larry Liner, MD Larry [name]. Larry [name], M.D.  DIAGNOSES: 1. Splenic marginal zone lymphoma, clinical remission. 2. Hypogonadism. 3. Anemia secondary to renal insufficiency.  CURRENT THERAPY:  Testosterone 200 mg q.4 weeks.  INTERIM HISTORY:  Larry [name] comes in for his followup.  He is doing well.  He has recovered nicely from his exploratory laparotomy.  He had 1 ulcer.  This was back in April.  He sees Dr. Caryn Section for his kidney function.  So far, his renal function has been holding pretty steady.  When we last checked his testosterone level it was 300 back in March.  He has had no fevers or sweats.  He has had no rashes.  He has had no cough.  PHYSICAL EXAMINATION:  General:  This is an elderly but well-nourished white gentleman in no obvious distress.  Vital signs:  96.7, pulse 59, respiratory rate 20, blood pressure 121/58.  Weight is 174.  Head and neck:  Exam shows a normocephalic, atraumatic skull.  There are no ocular or oral lesions.  There are no palpable cervical or supraclavicular lymph nodes.  Lungs:  Clear bilaterally.  Cardiac: Regular rate and rhythm with normal S1 and S2.  There are no murmurs, rubs or bruits.  Abdomen:  Soft with good bowel sounds.  There is no palpable abdominal mass.  There is no fluid wave.  There is no palpable hepatosplenomegaly.  He has well-healed laparotomy scars.  Back:  No tenderness over the spine, ribs or hips.  Extremities:  Shows 2+ edema in his lower legs bilaterally.  He has stasis dermatitis changes in his lower legs.  LABORATORY STUDIES:  White cell count is 10, hemoglobin 11, hematocrit 32.3, platelet count 276.  IMPRESSION:  Larry [name] is a 70 year old gentleman with a history of splenic marginal zone lymphoma.  He underwent a splenectomy.  This was back in October of 2009.  I did give him a cycle of adjuvant Rituxan. This was completed back in April of 2010.  We will go ahead and give him  testosterone today.  He is a little more anemic.  This may be from his renal insufficiency.  We are going to have to monitor this.  I want to see him back in another 2 months.  We will have him come back in 1 month for his testosterone injection.    ______________________________ Larry [name], M.D. PRE/MEDQ  D:  10/13/2011  T:  10/13/2011  Job:  [id number]

## 2011-10-20 ENCOUNTER — Ambulatory Visit: Payer: Medicare Other

## 2011-10-30 DIAGNOSIS — K219 Gastro-esophageal reflux disease without esophagitis: Secondary | ICD-10-CM | POA: Diagnosis not present

## 2011-10-30 DIAGNOSIS — E119 Type 2 diabetes mellitus without complications: Secondary | ICD-10-CM | POA: Diagnosis not present

## 2011-10-30 DIAGNOSIS — E039 Hypothyroidism, unspecified: Secondary | ICD-10-CM | POA: Diagnosis not present

## 2011-10-30 DIAGNOSIS — I10 Essential (primary) hypertension: Secondary | ICD-10-CM | POA: Diagnosis not present

## 2011-11-10 ENCOUNTER — Ambulatory Visit (HOSPITAL_BASED_OUTPATIENT_CLINIC_OR_DEPARTMENT_OTHER): Payer: Medicare Other

## 2011-11-10 VITALS — BP 124/57 | HR 63 | Temp 97.3°F

## 2011-11-10 DIAGNOSIS — E349 Endocrine disorder, unspecified: Secondary | ICD-10-CM

## 2011-11-10 DIAGNOSIS — E291 Testicular hypofunction: Secondary | ICD-10-CM

## 2011-11-10 MED ORDER — TESTOSTERONE CYPIONATE 200 MG/ML IM SOLN
200.0000 mg | INTRAMUSCULAR | Status: DC
  Administered 2011-11-10: 200 mg via INTRAMUSCULAR

## 2011-11-10 NOTE — Patient Instructions (Signed)
Testosterone injection What is this medicine? TESTOSTERONE (tes TOS ter one) is the main male hormone. It supports normal male development such as muscle growth, facial hair, and deep voice. It is used in males to treat low testosterone levels. This medicine may be used for other purposes; ask your health care provider or pharmacist if you have questions. What should I tell my health care provider before I take this medicine? They need to know if you have any of these conditions: -breast cancer -diabetes -heart disease -kidney disease -liver disease -lung disease -prostate cancer, enlargement -an unusual or allergic reaction to testosterone, other medicines, foods, dyes, or preservatives -pregnant or trying to get pregnant -breast-feeding How should I use this medicine? This medicine is for injection into a muscle. It is usually given by a health care professional in a hospital or clinic setting. Contact your pediatrician regarding the use of this medicine in children. While this medicine may be prescribed for children as young as 12 years of age for selected conditions, precautions do apply. Overdosage: If you think you have taken too much of this medicine contact a poison control center or emergency room at once. NOTE: This medicine is only for you. Do not share this medicine with others. What if I miss a dose? Try not to miss a dose. Your doctor or health care professional will tell you when your next injection is due. Notify the office if you are unable to keep an appointment. What may interact with this medicine? -medicines for diabetes -medicines that treat or prevent blood clots like warfarin -oxyphenbutazone -propranolol -steroid medicines like prednisone or cortisone This list may not describe all possible interactions. Give your health care provider a list of all the medicines, herbs, non-prescription drugs, or dietary supplements you use. Also tell them if you smoke, drink  alcohol, or use illegal drugs. Some items may interact with your medicine. What should I watch for while using this medicine? Visit your doctor or health care professional for regular checks on your progress. They will need to check the level of testosterone in your blood. This medicine may affect blood sugar levels. If you have diabetes, check with your doctor or health care professional before you change your diet or the dose of your diabetic medicine. This drug is banned from use in athletes by most athletic organizations. What side effects may I notice from receiving this medicine? Side effects that you should report to your doctor or health care professional as soon as possible: -allergic reactions like skin rash, itching or hives, swelling of the face, lips, or tongue -breast enlargement -breathing problems -changes in mood, especially anger, depression, or rage -dark urine -general ill feeling or flu-like symptoms -light-colored stools -loss of appetite, nausea -nausea, vomiting -right upper belly pain -stomach pain -swelling of ankles -too frequent or persistent erections -trouble passing urine or change in the amount of urine -unusually weak or tired -yellowing of the eyes or skin Additional side effects that can occur in women include: -deep or hoarse voice -facial hair growth -irregular menstrual periods Side effects that usually do not require medical attention (report to your doctor or health care professional if they continue or are bothersome): -acne -change in sex drive or performance -hair loss -headache This list may not describe all possible side effects. Call your doctor for medical advice about side effects. You may report side effects to FDA at 1-800-FDA-1088. Where should I keep my medicine? Keep out of the reach of children. This medicine   can be abused. Keep your medicine in a safe place to protect it from theft. Do not share this medicine with anyone.  Selling or giving away this medicine is dangerous and against the law. Store at room temperature between 20 and 25 degrees C (68 and 77 degrees F). Do not freeze. Protect from light. Follow the directions for the product you are prescribed. Throw away any unused medicine after the expiration date. NOTE: This sheet is a summary. It may not cover all possible information. If you have questions about this medicine, talk to your doctor, pharmacist, or health care provider.  2012, [employer]. (06/28/2007 4:13:46 PM) 

## 2011-11-30 DIAGNOSIS — E119 Type 2 diabetes mellitus without complications: Secondary | ICD-10-CM | POA: Diagnosis not present

## 2011-11-30 DIAGNOSIS — I10 Essential (primary) hypertension: Secondary | ICD-10-CM | POA: Diagnosis not present

## 2011-11-30 DIAGNOSIS — K219 Gastro-esophageal reflux disease without esophagitis: Secondary | ICD-10-CM | POA: Diagnosis not present

## 2011-12-08 ENCOUNTER — Ambulatory Visit (HOSPITAL_BASED_OUTPATIENT_CLINIC_OR_DEPARTMENT_OTHER): Payer: Medicare Other | Admitting: Medical

## 2011-12-08 ENCOUNTER — Other Ambulatory Visit (HOSPITAL_BASED_OUTPATIENT_CLINIC_OR_DEPARTMENT_OTHER): Payer: Medicare Other | Admitting: Lab

## 2011-12-08 ENCOUNTER — Other Ambulatory Visit: Payer: Medicare Other | Admitting: Lab

## 2011-12-08 ENCOUNTER — Ambulatory Visit: Payer: Medicare Other

## 2011-12-08 ENCOUNTER — Ambulatory Visit (HOSPITAL_BASED_OUTPATIENT_CLINIC_OR_DEPARTMENT_OTHER): Payer: Medicare Other

## 2011-12-08 ENCOUNTER — Ambulatory Visit: Payer: Medicare Other | Admitting: Hematology & Oncology

## 2011-12-08 ENCOUNTER — Ambulatory Visit: Payer: Medicare Other | Admitting: Medical

## 2011-12-08 VITALS — BP 124/62 | HR 55 | Temp 97.2°F | Resp 20 | Ht 68.0 in | Wt 174.0 lb

## 2011-12-08 DIAGNOSIS — E291 Testicular hypofunction: Secondary | ICD-10-CM

## 2011-12-08 DIAGNOSIS — E349 Endocrine disorder, unspecified: Secondary | ICD-10-CM

## 2011-12-08 DIAGNOSIS — D631 Anemia in chronic kidney disease: Secondary | ICD-10-CM

## 2011-12-08 DIAGNOSIS — C8307 Small cell B-cell lymphoma, spleen: Secondary | ICD-10-CM

## 2011-12-08 DIAGNOSIS — N289 Disorder of kidney and ureter, unspecified: Secondary | ICD-10-CM

## 2011-12-08 DIAGNOSIS — N039 Chronic nephritic syndrome with unspecified morphologic changes: Secondary | ICD-10-CM | POA: Diagnosis not present

## 2011-12-08 DIAGNOSIS — D649 Anemia, unspecified: Secondary | ICD-10-CM

## 2011-12-08 LAB — CBC WITH DIFFERENTIAL (CANCER CENTER ONLY)
BASO#: 0.1 10*3/uL (ref 0.0–0.2)
BASO%: 0.7 % (ref 0.0–2.0)
EOS%: 7.2 % — ABNORMAL HIGH (ref 0.0–7.0)
HCT: 32.3 % — ABNORMAL LOW (ref 38.7–49.9)
LYMPH#: 1.5 10*3/uL (ref 0.9–3.3)
MCH: 34.7 pg — ABNORMAL HIGH (ref 28.0–33.4)
MCHC: 34.4 g/dL (ref 32.0–35.9)
MCV: 101 fL — ABNORMAL HIGH (ref 82–98)
MONO%: 14.2 % — ABNORMAL HIGH (ref 0.0–13.0)
NEUT#: 4.3 10*3/uL (ref 1.5–6.5)
Platelets: 210 10*3/uL (ref 145–400)
RBC: 3.2 10*6/uL — ABNORMAL LOW (ref 4.20–5.70)
RDW: 14.5 % (ref 11.1–15.7)
WBC: 7.5 10*3/uL (ref 4.0–10.0)

## 2011-12-08 MED ORDER — TESTOSTERONE CYPIONATE 200 MG/ML IM SOLN
200.0000 mg | INTRAMUSCULAR | Status: DC
  Administered 2011-12-08: 200 mg via INTRAMUSCULAR

## 2011-12-08 NOTE — Progress Notes (Signed)
Patient Name : Larry [name], Larry [name] MR #[id number] DOB: 11-29-1940 Encounter Date: 12/08/2011 Dictated by Eunice Blase, PA-C  Diagnoses: #1 splenic marginal zone lymphoma, clinical remission. #2 hypogonadism. #3 anemia, secondary to renal insufficiency.  Current therapy: Testosterone, 200 mg every 4 weeks.  Interim history Larry [name] presents today for an office followup visit.  Overall, he is doing quite well.  He does not present with any new problems.  He sees Dr. Caryn Section for his kidney function.  So far.  His renal function has been holding pretty steady.  He follows up with his primary care physician, Dr. Mayford Knife on a monthly basis.  His last testosterone, back in June, was 196.  He does receive testosterone injection 200 mg every 4 weeks.  He denies any fevers, chills, or night sweats.  He denies any rashes.  He denies any cough, chest pain, shortness of breath.  He is eating good.  He denies any nausea, vomiting, diarrhea, constipation, any headaches or blurry vision.  He denies any changes in bowel or bladder, habits.  He denies any obvious, bleeding.  He does have some lower extremity swelling, which is chronic.  He denies any abdominal pain.  Review of Systems:chronic lower extremity swelling Pt. Denies any changes in their vision, hearing, adenopathy, fevers, chills, nausea, vomiting, diarrhea, constipation, chest pain, shortness of breath, passing blood, passing out, blacking out,  any changes in skin, joints, neurologic or psychiatric except as noted.  Physical Exam: This is a pleasant, 71 year old, gentleman, who is well-developed, well-nourished, in no obvious distress Vitals: Temperature 97.2 degrees.  Pulse 55, respirations 20, blood pressure 124/62.  Weight 174 pounds HEENT reveals a normocephalic, atraumatic skull, no scleral icterus, no oral lesions  Neck is supple without any cervical or supraclavicular adenopathy.  Lungs are clear to auscultation bilaterally. There are no wheezes,  rales or rhonci Cardiac is regular rate and rhythm with a normal S1 and S2. There are no murmurs, rubs, or bruits.  Abdomen is soft with good bowel sounds, there is no palpable mass. There is no palpable hepatosplenomegaly. There is no palpable fluid wave.  Musculoskeletal no tenderness of the spine, ribs, or hips.  Extremities there are no clubbing, cyanosis, or edema.  Skin no petechia, purpura or ecchymosis Neurologic is nonfocal.  Laboratory Data: White count 7.5, hemoglobin 11.1, hematocrit 32.3, platelets 210,000  Current Outpatient Prescriptions on File Prior to Visit  Medication Sig Dispense Refill  . allopurinol (ZYLOPRIM) 150 mg TABS Take 300 mg by mouth daily.       Marland Kitchen amLODipine (NORVASC) 10 MG tablet Take 10 mg by mouth daily.        Marland Kitchen doxazosin (CARDURA) 2 MG tablet Take 4 mg by mouth at bedtime.       Marland Kitchen esomeprazole (NEXIUM) 40 MG capsule Take 40 mg by mouth daily before breakfast.       . furosemide (LASIX) 40 MG tablet Take 40 mg by mouth daily.       Marland Kitchen levothyroxine (SYNTHROID, LEVOTHROID) 150 MCG tablet Take 150 mcg by mouth daily.        . sitaGLIPtin (JANUVIA) 50 MG tablet Take 50 mg by mouth daily.      Marland Kitchen tenormin (ATENOLOL) 12.5 mg TABS Take 25 mg by mouth daily.       Marland Kitchen triamcinolone cream (KENALOG) 0.1 % as needed.       Marland Kitchen omeprazole (PRILOSEC) 20 MG capsule Pt states he may be starting this after he finishes Nexium.  Assessment/Plan: This is a pleasant, 71 year old, gentleman, with the following issues: #1.  History of splenic marginal zone lymphoma-he did undergo a splenectomy.  This was in October of 2009.  He did have a cycle of adjuvant Rituxan.  This was completed in April, 2010.   #2 hypogonadism-he will go ahead and receive his testosterone injection today.  #3 anemia of renal insufficiency-his hemoglobin is holding steady at 11.1.  We will continue to monitor this closely.  #4.  Followup-we will followup with Larry [name] in 2 months, but before  then should there be questions or concerns.

## 2011-12-08 NOTE — Patient Instructions (Signed)
Testosterone injection What is this medicine? TESTOSTERONE (tes TOS ter one) is the main male hormone. It supports normal male development such as muscle growth, facial hair, and deep voice. It is used in males to treat low testosterone levels. This medicine may be used for other purposes; ask your health care provider or pharmacist if you have questions. What should I tell my health care provider before I take this medicine? They need to know if you have any of these conditions: -breast cancer -diabetes -heart disease -kidney disease -liver disease -lung disease -prostate cancer, enlargement -an unusual or allergic reaction to testosterone, other medicines, foods, dyes, or preservatives -pregnant or trying to get pregnant -breast-feeding How should I use this medicine? This medicine is for injection into a muscle. It is usually given by a health care professional in a hospital or clinic setting. Contact your pediatrician regarding the use of this medicine in children. While this medicine may be prescribed for children as young as 12 years of age for selected conditions, precautions do apply. Overdosage: If you think you have taken too much of this medicine contact a poison control center or emergency room at once. NOTE: This medicine is only for you. Do not share this medicine with others. What if I miss a dose? Try not to miss a dose. Your doctor or health care professional will tell you when your next injection is due. Notify the office if you are unable to keep an appointment. What may interact with this medicine? -medicines for diabetes -medicines that treat or prevent blood clots like warfarin -oxyphenbutazone -propranolol -steroid medicines like prednisone or cortisone This list may not describe all possible interactions. Give your health care provider a list of all the medicines, herbs, non-prescription drugs, or dietary supplements you use. Also tell them if you smoke, drink  alcohol, or use illegal drugs. Some items may interact with your medicine. What should I watch for while using this medicine? Visit your doctor or health care professional for regular checks on your progress. They will need to check the level of testosterone in your blood. This medicine may affect blood sugar levels. If you have diabetes, check with your doctor or health care professional before you change your diet or the dose of your diabetic medicine. This drug is banned from use in athletes by most athletic organizations. What side effects may I notice from receiving this medicine? Side effects that you should report to your doctor or health care professional as soon as possible: -allergic reactions like skin rash, itching or hives, swelling of the face, lips, or tongue -breast enlargement -breathing problems -changes in mood, especially anger, depression, or rage -dark urine -general ill feeling or flu-like symptoms -light-colored stools -loss of appetite, nausea -nausea, vomiting -right upper belly pain -stomach pain -swelling of ankles -too frequent or persistent erections -trouble passing urine or change in the amount of urine -unusually weak or tired -yellowing of the eyes or skin Additional side effects that can occur in women include: -deep or hoarse voice -facial hair growth -irregular menstrual periods Side effects that usually do not require medical attention (report to your doctor or health care professional if they continue or are bothersome): -acne -change in sex drive or performance -hair loss -headache This list may not describe all possible side effects. Call your doctor for medical advice about side effects. You may report side effects to FDA at 1-800-FDA-1088. Where should I keep my medicine? Keep out of the reach of children. This medicine   can be abused. Keep your medicine in a safe place to protect it from theft. Do not share this medicine with anyone.  Selling or giving away this medicine is dangerous and against the law. Store at room temperature between 20 and 25 degrees C (68 and 77 degrees F). Do not freeze. Protect from light. Follow the directions for the product you are prescribed. Throw away any unused medicine after the expiration date. NOTE: This sheet is a summary. It may not cover all possible information. If you have questions about this medicine, talk to your doctor, pharmacist, or health care provider.  2012, [employer]. (06/28/2007 4:13:46 PM) 

## 2011-12-11 LAB — BASIC METABOLIC PANEL
Calcium: 9.6 mg/dL (ref 8.4–10.5)
Glucose, Bld: 137 mg/dL — ABNORMAL HIGH (ref 70–99)
Sodium: 141 mEq/L (ref 135–145)

## 2011-12-11 LAB — LACTATE DEHYDROGENASE: LDH: 142 U/L (ref 94–250)

## 2011-12-11 LAB — FERRITIN: Ferritin: 171 ng/mL (ref 22–322)

## 2011-12-29 DIAGNOSIS — D649 Anemia, unspecified: Secondary | ICD-10-CM | POA: Diagnosis not present

## 2011-12-29 DIAGNOSIS — M109 Gout, unspecified: Secondary | ICD-10-CM | POA: Diagnosis not present

## 2012-01-08 ENCOUNTER — Ambulatory Visit: Payer: Medicare Other

## 2012-01-10 DIAGNOSIS — M109 Gout, unspecified: Secondary | ICD-10-CM | POA: Diagnosis not present

## 2012-01-10 DIAGNOSIS — R21 Rash and other nonspecific skin eruption: Secondary | ICD-10-CM | POA: Diagnosis not present

## 2012-01-10 DIAGNOSIS — I129 Hypertensive chronic kidney disease with stage 1 through stage 4 chronic kidney disease, or unspecified chronic kidney disease: Secondary | ICD-10-CM | POA: Diagnosis not present

## 2012-01-11 ENCOUNTER — Ambulatory Visit: Payer: Medicare Other

## 2012-01-15 ENCOUNTER — Ambulatory Visit: Payer: Medicare Other

## 2012-01-17 ENCOUNTER — Ambulatory Visit (HOSPITAL_BASED_OUTPATIENT_CLINIC_OR_DEPARTMENT_OTHER): Payer: Medicare Other

## 2012-01-17 VITALS — BP 142/62 | HR 67 | Temp 96.7°F | Resp 18

## 2012-01-17 DIAGNOSIS — E291 Testicular hypofunction: Secondary | ICD-10-CM | POA: Diagnosis not present

## 2012-01-17 DIAGNOSIS — E349 Endocrine disorder, unspecified: Secondary | ICD-10-CM

## 2012-01-17 MED ORDER — TESTOSTERONE CYPIONATE 200 MG/ML IM SOLN
200.0000 mg | INTRAMUSCULAR | Status: DC
  Administered 2012-01-17: 200 mg via INTRAMUSCULAR

## 2012-01-17 NOTE — Patient Instructions (Signed)
Testosterone injection What is this medicine? TESTOSTERONE (tes TOS ter one) is the main male hormone. It supports normal male development such as muscle growth, facial hair, and deep voice. It is used in males to treat low testosterone levels. This medicine may be used for other purposes; ask your health care provider or pharmacist if you have questions. What should I tell my health care provider before I take this medicine? They need to know if you have any of these conditions: -breast cancer -diabetes -heart disease -kidney disease -liver disease -lung disease -prostate cancer, enlargement -an unusual or allergic reaction to testosterone, other medicines, foods, dyes, or preservatives -pregnant or trying to get pregnant -breast-feeding How should I use this medicine? This medicine is for injection into a muscle. It is usually given by a health care professional in a hospital or clinic setting. Contact your pediatrician regarding the use of this medicine in children. While this medicine may be prescribed for children as young as 12 years of age for selected conditions, precautions do apply. Overdosage: If you think you have taken too much of this medicine contact a poison control center or emergency room at once. NOTE: This medicine is only for you. Do not share this medicine with others. What if I miss a dose? Try not to miss a dose. Your doctor or health care professional will tell you when your next injection is due. Notify the office if you are unable to keep an appointment. What may interact with this medicine? -medicines for diabetes -medicines that treat or prevent blood clots like warfarin -oxyphenbutazone -propranolol -steroid medicines like prednisone or cortisone This list may not describe all possible interactions. Give your health care provider a list of all the medicines, herbs, non-prescription drugs, or dietary supplements you use. Also tell them if you smoke, drink  alcohol, or use illegal drugs. Some items may interact with your medicine. What should I watch for while using this medicine? Visit your doctor or health care professional for regular checks on your progress. They will need to check the level of testosterone in your blood. This medicine may affect blood sugar levels. If you have diabetes, check with your doctor or health care professional before you change your diet or the dose of your diabetic medicine. This drug is banned from use in athletes by most athletic organizations. What side effects may I notice from receiving this medicine? Side effects that you should report to your doctor or health care professional as soon as possible: -allergic reactions like skin rash, itching or hives, swelling of the face, lips, or tongue -breast enlargement -breathing problems -changes in mood, especially anger, depression, or rage -dark urine -general ill feeling or flu-like symptoms -light-colored stools -loss of appetite, nausea -nausea, vomiting -right upper belly pain -stomach pain -swelling of ankles -too frequent or persistent erections -trouble passing urine or change in the amount of urine -unusually weak or tired -yellowing of the eyes or skin Additional side effects that can occur in women include: -deep or hoarse voice -facial hair growth -irregular menstrual periods Side effects that usually do not require medical attention (report to your doctor or health care professional if they continue or are bothersome): -acne -change in sex drive or performance -hair loss -headache This list may not describe all possible side effects. Call your doctor for medical advice about side effects. You may report side effects to FDA at 1-800-FDA-1088. Where should I keep my medicine? Keep out of the reach of children. This medicine   can be abused. Keep your medicine in a safe place to protect it from theft. Do not share this medicine with anyone.  Selling or giving away this medicine is dangerous and against the law. Store at room temperature between 20 and 25 degrees C (68 and 77 degrees F). Do not freeze. Protect from light. Follow the directions for the product you are prescribed. Throw away any unused medicine after the expiration date. NOTE: This sheet is a summary. It may not cover all possible information. If you have questions about this medicine, talk to your doctor, pharmacist, or health care provider.  2012, [employer]. (06/28/2007 4:13:46 PM) 

## 2012-02-02 ENCOUNTER — Telehealth: Payer: Self-pay | Admitting: Hematology & Oncology

## 2012-02-02 NOTE — Telephone Encounter (Signed)
Left message with new 10-9 time

## 2012-02-07 ENCOUNTER — Ambulatory Visit: Payer: Medicare Other | Admitting: Hematology & Oncology

## 2012-02-07 ENCOUNTER — Ambulatory Visit: Payer: Medicare Other

## 2012-02-07 ENCOUNTER — Other Ambulatory Visit: Payer: Medicare Other | Admitting: Lab

## 2012-02-07 ENCOUNTER — Ambulatory Visit: Payer: Medicare Other | Admitting: Medical

## 2012-02-08 ENCOUNTER — Ambulatory Visit (HOSPITAL_BASED_OUTPATIENT_CLINIC_OR_DEPARTMENT_OTHER): Payer: Medicare Other

## 2012-02-08 ENCOUNTER — Other Ambulatory Visit (HOSPITAL_BASED_OUTPATIENT_CLINIC_OR_DEPARTMENT_OTHER): Payer: Medicare Other | Admitting: Lab

## 2012-02-08 ENCOUNTER — Ambulatory Visit (HOSPITAL_BASED_OUTPATIENT_CLINIC_OR_DEPARTMENT_OTHER): Payer: Medicare Other | Admitting: Medical

## 2012-02-08 VITALS — BP 139/64 | HR 67 | Temp 97.6°F | Resp 18 | Ht 68.0 in | Wt 163.0 lb

## 2012-02-08 DIAGNOSIS — E349 Endocrine disorder, unspecified: Secondary | ICD-10-CM

## 2012-02-08 DIAGNOSIS — D649 Anemia, unspecified: Secondary | ICD-10-CM | POA: Diagnosis not present

## 2012-02-08 DIAGNOSIS — N289 Disorder of kidney and ureter, unspecified: Secondary | ICD-10-CM | POA: Diagnosis not present

## 2012-02-08 DIAGNOSIS — E291 Testicular hypofunction: Secondary | ICD-10-CM

## 2012-02-08 DIAGNOSIS — C8307 Small cell B-cell lymphoma, spleen: Secondary | ICD-10-CM

## 2012-02-08 DIAGNOSIS — Z23 Encounter for immunization: Secondary | ICD-10-CM | POA: Diagnosis not present

## 2012-02-08 LAB — CBC WITH DIFFERENTIAL (CANCER CENTER ONLY)
BASO%: 0.8 % (ref 0.0–2.0)
LYMPH%: 20.5 % (ref 14.0–48.0)
MCH: 34.3 pg — ABNORMAL HIGH (ref 28.0–33.4)
MCV: 97 fL (ref 82–98)
MONO#: 1.2 10*3/uL — ABNORMAL HIGH (ref 0.1–0.9)
MONO%: 16.7 % — ABNORMAL HIGH (ref 0.0–13.0)
NEUT#: 3.9 10*3/uL (ref 1.5–6.5)
Platelets: 206 10*3/uL (ref 145–400)
RDW: 14.7 % (ref 11.1–15.7)
WBC: 7.4 10*3/uL (ref 4.0–10.0)

## 2012-02-08 LAB — COMPREHENSIVE METABOLIC PANEL
ALT: 13 U/L (ref 0–53)
Alkaline Phosphatase: 101 U/L (ref 39–117)
Potassium: 4.5 mEq/L (ref 3.5–5.3)
Sodium: 139 mEq/L (ref 135–145)
Total Bilirubin: 1.2 mg/dL (ref 0.3–1.2)
Total Protein: 7.2 g/dL (ref 6.0–8.3)

## 2012-02-08 LAB — TESTOSTERONE: Testosterone: 172.01 ng/dL — ABNORMAL LOW (ref 300–890)

## 2012-02-08 LAB — IRON AND TIBC
%SAT: 45 % (ref 20–55)
TIBC: 260 ug/dL (ref 215–435)

## 2012-02-08 MED ORDER — TESTOSTERONE CYPIONATE 200 MG/ML IM SOLN
200.0000 mg | INTRAMUSCULAR | Status: DC
  Administered 2012-02-08: 200 mg via INTRAMUSCULAR

## 2012-02-08 MED ORDER — INFLUENZA VIRUS VACC SPLIT PF IM SUSP
0.5000 mL | Freq: Once | INTRAMUSCULAR | Status: AC
  Administered 2012-02-08: 0.5 mL via INTRAMUSCULAR
  Filled 2012-02-08: qty 0.5

## 2012-02-08 MED ORDER — INFLUENZA VIRUS VACC SPLIT PF IM SUSP
0.5000 mL | Freq: Once | INTRAMUSCULAR | Status: DC
  Filled 2012-02-08: qty 0.5

## 2012-02-08 NOTE — Progress Notes (Signed)
Diagnoses: #1 splenic marginal zone lymphoma, clinical remission. #2 hypogonadism. #3 anemia, secondary to renal insufficiency.  Current therapy: Testosterone, 200 mg every 4 weeks.  Interim history Larry [name] presents today for an office followup visit.  Overall, he is doing quite well.  He does not present with any new problems.  He sees Larry [name] for his kidney function.  So far.  His renal function has been holding pretty steady.  He follows up with his primary care physician, Larry [name] on a monthly basis.  His last testosterone, back in August was 272.  He does receive testosterone injection 200 mg every 4 weeks.  He denies any fevers, chills, or night sweats.  He denies any rashes.  He denies any cough, chest pain, shortness of breath.  He is eating good.  He denies any nausea, vomiting, diarrhea, constipation, any headaches or blurry vision.  He denies any changes in bowel or bladder, habits.  He denies any obvious, bleeding.  He does have some lower extremity swelling, which is chronic.  He denies any abdominal pain.  Review of Systems:chronic lower extremity swelling Pt. Denies any changes in their vision, hearing, adenopathy, fevers, chills, nausea, vomiting, diarrhea, constipation, chest pain, shortness of breath, passing blood, passing out, blacking out,  any changes in skin, joints, neurologic or psychiatric except as noted.  Physical Exam: This is a pleasant, 71 year old, gentleman, who is well-developed, well-nourished, in no obvious distress Vitals: Temperature 97.6 degrees, pulse 67, respirations 18, blood pressure 139/64.  Weight 163 pounds HEENT reveals a normocephalic, atraumatic skull, no scleral icterus, no oral lesions  Neck is supple without any cervical or supraclavicular adenopathy.  Lungs are clear to auscultation bilaterally. There are no wheezes, rales or rhonci Cardiac is regular rate and rhythm with a normal S1 and S2. There are no murmurs, rubs, or bruits.  Abdomen  is soft with good bowel sounds, there is no palpable mass. There is no palpable hepatosplenomegaly. There is no palpable fluid wave.  Musculoskeletal no tenderness of the spine, ribs, or hips.  Extremities there are no clubbing, cyanosis, or edema.  Skin no petechia, purpura or ecchymosis Neurologic is nonfocal.  Laboratory Data: White count 7.4, hemoglobin 12.8, hematocrit 36.2, platelets 206,000  Current Outpatient Prescriptions on File Prior to Visit  Medication Sig Dispense Refill  . amLODipine (NORVASC) 10 MG tablet Take 10 mg by mouth daily.        Marland Kitchen doxazosin (CARDURA) 2 MG tablet Take 4 mg by mouth at bedtime.       . furosemide (LASIX) 40 MG tablet Take 40 mg by mouth daily.       Marland Kitchen levothyroxine (SYNTHROID, LEVOTHROID) 150 MCG tablet Take 150 mcg by mouth daily.        Marland Kitchen omeprazole (PRILOSEC) 20 MG capsule 20 mg 2 (two) times daily.       Marland Kitchen triamcinolone cream (KENALOG) 0.1 % as needed.        Assessment/Plan: This is a pleasant, 71 year old, gentleman, with the following issues: #1.  History of splenic marginal zone lymphoma-he did undergo a splenectomy.  This was in October of 2009.  He did have a cycle of adjuvant Rituxan.  This was completed in April, 2010.   #2 hypogonadism-he will go ahead and receive his testosterone injection today. He gets this every 4 weeks.  #3 anemia of renal insufficiency-his hemoglobin is holding steady at 12.8.  We will continue to monitor this closely.  #4.  Followup-we will followup with Larry [name]  in 2 months, but before then should there be questions or concerns.

## 2012-02-08 NOTE — Patient Instructions (Signed)
Testosterone injection What is this medicine? TESTOSTERONE (tes TOS ter one) is the main male hormone. It supports normal male development such as muscle growth, facial hair, and deep voice. It is used in males to treat low testosterone levels. This medicine may be used for other purposes; ask your health care provider or pharmacist if you have questions. What should I tell my health care provider before I take this medicine? They need to know if you have any of these conditions: -breast cancer -diabetes -heart disease -kidney disease -liver disease -lung disease -prostate cancer, enlargement -an unusual or allergic reaction to testosterone, other medicines, foods, dyes, or preservatives -pregnant or trying to get pregnant -breast-feeding How should I use this medicine? This medicine is for injection into a muscle. It is usually given by a health care professional in a hospital or clinic setting. Contact your pediatrician regarding the use of this medicine in children. While this medicine may be prescribed for children as young as 12 years of age for selected conditions, precautions do apply. Overdosage: If you think you have taken too much of this medicine contact a poison control center or emergency room at once. NOTE: This medicine is only for you. Do not share this medicine with others. What if I miss a dose? Try not to miss a dose. Your doctor or health care professional will tell you when your next injection is due. Notify the office if you are unable to keep an appointment. What may interact with this medicine? -medicines for diabetes -medicines that treat or prevent blood clots like warfarin -oxyphenbutazone -propranolol -steroid medicines like prednisone or cortisone This list may not describe all possible interactions. Give your health care provider a list of all the medicines, herbs, non-prescription drugs, or dietary supplements you use. Also tell them if you smoke, drink  alcohol, or use illegal drugs. Some items may interact with your medicine. What should I watch for while using this medicine? Visit your doctor or health care professional for regular checks on your progress. They will need to check the level of testosterone in your blood. This medicine may affect blood sugar levels. If you have diabetes, check with your doctor or health care professional before you change your diet or the dose of your diabetic medicine. This drug is banned from use in athletes by most athletic organizations. What side effects may I notice from receiving this medicine? Side effects that you should report to your doctor or health care professional as soon as possible: -allergic reactions like skin rash, itching or hives, swelling of the face, lips, or tongue -breast enlargement -breathing problems -changes in mood, especially anger, depression, or rage -dark urine -general ill feeling or flu-like symptoms -light-colored stools -loss of appetite, nausea -nausea, vomiting -right upper belly pain -stomach pain -swelling of ankles -too frequent or persistent erections -trouble passing urine or change in the amount of urine -unusually weak or tired -yellowing of the eyes or skin Additional side effects that can occur in women include: -deep or hoarse voice -facial hair growth -irregular menstrual periods Side effects that usually do not require medical attention (report to your doctor or health care professional if they continue or are bothersome): -acne -change in sex drive or performance -hair loss -headache This list may not describe all possible side effects. Call your doctor for medical advice about side effects. You may report side effects to FDA at 1-800-FDA-1088. Where should I keep my medicine? Keep out of the reach of children. This medicine   can be abused. Keep your medicine in a safe place to protect it from theft. Do not share this medicine with anyone.  Selling or giving away this medicine is dangerous and against the law. Store at room temperature between 20 and 25 degrees C (68 and 77 degrees F). Do not freeze. Protect from light. Follow the directions for the product you are prescribed. Throw away any unused medicine after the expiration date. NOTE: This sheet is a summary. It may not cover all possible information. If you have questions about this medicine, talk to your doctor, pharmacist, or health care provider.  2012, [employer]. (06/28/2007 4:13:46 PM) 

## 2012-02-14 DIAGNOSIS — I129 Hypertensive chronic kidney disease with stage 1 through stage 4 chronic kidney disease, or unspecified chronic kidney disease: Secondary | ICD-10-CM | POA: Diagnosis not present

## 2012-02-14 DIAGNOSIS — M109 Gout, unspecified: Secondary | ICD-10-CM | POA: Diagnosis not present

## 2012-03-07 ENCOUNTER — Ambulatory Visit (HOSPITAL_BASED_OUTPATIENT_CLINIC_OR_DEPARTMENT_OTHER): Payer: Medicare Other

## 2012-03-07 VITALS — BP 143/63 | HR 69 | Temp 98.0°F | Resp 18

## 2012-03-07 DIAGNOSIS — E291 Testicular hypofunction: Secondary | ICD-10-CM | POA: Diagnosis not present

## 2012-03-07 DIAGNOSIS — E349 Endocrine disorder, unspecified: Secondary | ICD-10-CM

## 2012-03-07 MED ORDER — TESTOSTERONE CYPIONATE 200 MG/ML IM SOLN
200.0000 mg | INTRAMUSCULAR | Status: DC
  Administered 2012-03-07: 200 mg via INTRAMUSCULAR

## 2012-04-04 ENCOUNTER — Ambulatory Visit (HOSPITAL_BASED_OUTPATIENT_CLINIC_OR_DEPARTMENT_OTHER): Payer: Medicare Other

## 2012-04-04 ENCOUNTER — Other Ambulatory Visit (HOSPITAL_BASED_OUTPATIENT_CLINIC_OR_DEPARTMENT_OTHER): Payer: Medicare Other | Admitting: Lab

## 2012-04-04 ENCOUNTER — Ambulatory Visit (HOSPITAL_BASED_OUTPATIENT_CLINIC_OR_DEPARTMENT_OTHER): Payer: Medicare Other | Admitting: Hematology & Oncology

## 2012-04-04 VITALS — BP 153/55 | HR 83 | Temp 97.6°F | Resp 18 | Ht 68.0 in | Wt 173.0 lb

## 2012-04-04 DIAGNOSIS — E291 Testicular hypofunction: Secondary | ICD-10-CM

## 2012-04-04 DIAGNOSIS — E349 Endocrine disorder, unspecified: Secondary | ICD-10-CM

## 2012-04-04 DIAGNOSIS — D649 Anemia, unspecified: Secondary | ICD-10-CM

## 2012-04-04 DIAGNOSIS — C8307 Small cell B-cell lymphoma, spleen: Secondary | ICD-10-CM | POA: Diagnosis not present

## 2012-04-04 LAB — CBC WITH DIFFERENTIAL (CANCER CENTER ONLY)
BASO#: 0 10*3/uL (ref 0.0–0.2)
Eosinophils Absolute: 0.8 10*3/uL — ABNORMAL HIGH (ref 0.0–0.5)
HCT: 36.2 % — ABNORMAL LOW (ref 38.7–49.9)
LYMPH%: 20.7 % (ref 14.0–48.0)
MCH: 34.3 pg — ABNORMAL HIGH (ref 28.0–33.4)
MCV: 100 fL — ABNORMAL HIGH (ref 82–98)
MONO#: 1.2 10*3/uL — ABNORMAL HIGH (ref 0.1–0.9)
MONO%: 15.3 % — ABNORMAL HIGH (ref 0.0–13.0)
NEUT%: 53.1 % (ref 40.0–80.0)
Platelets: 263 10*3/uL (ref 145–400)
RBC: 3.61 10*6/uL — ABNORMAL LOW (ref 4.20–5.70)
WBC: 8.1 10*3/uL (ref 4.0–10.0)

## 2012-04-04 MED ORDER — TESTOSTERONE CYPIONATE 200 MG/ML IM SOLN
200.0000 mg | INTRAMUSCULAR | Status: DC
  Administered 2012-04-04: 200 mg via INTRAMUSCULAR

## 2012-04-04 NOTE — Progress Notes (Signed)
This office note has been dictated.

## 2012-04-04 NOTE — Patient Instructions (Signed)
Testosterone injection What is this medicine? TESTOSTERONE (tes TOS ter one) is the main male hormone. It supports normal male development such as muscle growth, facial hair, and deep voice. It is used in males to treat low testosterone levels. This medicine may be used for other purposes; ask your health care provider or pharmacist if you have questions. What should I tell my health care provider before I take this medicine? They need to know if you have any of these conditions: -breast cancer -diabetes -heart disease -kidney disease -liver disease -lung disease -prostate cancer, enlargement -an unusual or allergic reaction to testosterone, other medicines, foods, dyes, or preservatives -pregnant or trying to get pregnant -breast-feeding How should I use this medicine? This medicine is for injection into a muscle. It is usually given by a health care professional in a hospital or clinic setting. Contact your pediatrician regarding the use of this medicine in children. While this medicine may be prescribed for children as young as 12 years of age for selected conditions, precautions do apply. Overdosage: If you think you have taken too much of this medicine contact a poison control center or emergency room at once. NOTE: This medicine is only for you. Do not share this medicine with others. What if I miss a dose? Try not to miss a dose. Your doctor or health care professional will tell you when your next injection is due. Notify the office if you are unable to keep an appointment. What may interact with this medicine? -medicines for diabetes -medicines that treat or prevent blood clots like warfarin -oxyphenbutazone -propranolol -steroid medicines like prednisone or cortisone This list may not describe all possible interactions. Give your health care provider a list of all the medicines, herbs, non-prescription drugs, or dietary supplements you use. Also tell them if you smoke, drink  alcohol, or use illegal drugs. Some items may interact with your medicine. What should I watch for while using this medicine? Visit your doctor or health care professional for regular checks on your progress. They will need to check the level of testosterone in your blood. This medicine may affect blood sugar levels. If you have diabetes, check with your doctor or health care professional before you change your diet or the dose of your diabetic medicine. This drug is banned from use in athletes by most athletic organizations. What side effects may I notice from receiving this medicine? Side effects that you should report to your doctor or health care professional as soon as possible: -allergic reactions like skin rash, itching or hives, swelling of the face, lips, or tongue -breast enlargement -breathing problems -changes in mood, especially anger, depression, or rage -dark urine -general ill feeling or flu-like symptoms -light-colored stools -loss of appetite, nausea -nausea, vomiting -right upper belly pain -stomach pain -swelling of ankles -too frequent or persistent erections -trouble passing urine or change in the amount of urine -unusually weak or tired -yellowing of the eyes or skin Additional side effects that can occur in women include: -deep or hoarse voice -facial hair growth -irregular menstrual periods Side effects that usually do not require medical attention (report to your doctor or health care professional if they continue or are bothersome): -acne -change in sex drive or performance -hair loss -headache This list may not describe all possible side effects. Call your doctor for medical advice about side effects. You may report side effects to FDA at 1-800-FDA-1088. Where should I keep my medicine? Keep out of the reach of children. This medicine   can be abused. Keep your medicine in a safe place to protect it from theft. Do not share this medicine with anyone.  Selling or giving away this medicine is dangerous and against the law. Store at room temperature between 20 and 25 degrees C (68 and 77 degrees F). Do not freeze. Protect from light. Follow the directions for the product you are prescribed. Throw away any unused medicine after the expiration date. NOTE: This sheet is a summary. It may not cover all possible information. If you have questions about this medicine, talk to your doctor, pharmacist, or health care provider.  2013, [employer]. (06/28/2007 4:13:46 PM)  

## 2012-04-05 LAB — COMPREHENSIVE METABOLIC PANEL
Alkaline Phosphatase: 99 U/L (ref 39–117)
BUN: 52 mg/dL — ABNORMAL HIGH (ref 6–23)
CO2: 22 mEq/L (ref 19–32)
Creatinine, Ser: 1.88 mg/dL — ABNORMAL HIGH (ref 0.50–1.35)
Glucose, Bld: 132 mg/dL — ABNORMAL HIGH (ref 70–99)
Total Bilirubin: 0.5 mg/dL (ref 0.3–1.2)

## 2012-04-05 LAB — ERYTHROPOIETIN: Erythropoietin: 13.8 m[IU]/mL (ref 2.6–34.0)

## 2012-04-05 LAB — TESTOSTERONE: Testosterone: 211.28 ng/dL — ABNORMAL LOW (ref 300–890)

## 2012-04-05 LAB — IRON AND TIBC
%SAT: 33 % (ref 20–55)
Iron: 93 ug/dL (ref 42–165)
UIBC: 190 ug/dL (ref 125–400)

## 2012-04-05 LAB — LACTATE DEHYDROGENASE: LDH: 148 U/L (ref 94–250)

## 2012-04-08 NOTE — Progress Notes (Signed)
CC:   Larry Liner, MD Larry [name]. Caryn Section, M.D.  DIAGNOSES: 1. Splenic marginal zone lymphoma, clinical remission. 2. Chronic renal insufficiency with anemia. 3. Hypogonadism.  CURRENT THERAPY:  Testosterone 200 mg IM q.4 weeks.  INTERIM HISTORY:  Larry [name] comes in for his followup.  He is doing well.  We last saw him back in October.  His kidneys seem to be doing okay.  He has not had any problems from what he says.  His last renal function showed a BUN of 51, creatinine of 1.77 back in October.  He has fully recovered from his perforated duodenal ulcer back in April. He required an exploratory laparotomy for this.  He has had no problems with fevers, sweats or chills.  His weight is up a little bit.  PHYSICAL EXAMINATION:  General:  This is a well-developed, well- nourished white gentleman in no obvious distress.  Vital signs:  Show temperature of 97.6, pulse 83, respiratory rate 18, blood pressure 153/55.  Weight is 173.  Head and neck:  Shows a normocephalic, atraumatic skull.  There are no ocular or oral lesions.  There are no palpable cervical or supraclavicular lymph nodes.  Lungs:  Clear bilaterally.  Cardiac:  Regular rate and rhythm with a normal S1 and S2. There are no murmurs, rubs or bruits.  Abdomen:  Soft with good bowel sounds.  He has a well-healed splenectomy scar.  He has a well-healed laparotomy scar.  There is no fluid wave.  There is no guarding or rebound tenderness.  There is no palpable hepatomegaly.  Extremities: Show some trace edema in his legs bilaterally.  He has good range of motion of his joints.  Skin:  Shows some scattered actinic keratoses. Neurologic:  No focal neurological deficits.  LABORATORY STUDIES:  White cell count is 8.1, hemoglobin 12.4, hematocrit 36.2, platelet count 263.  IMPRESSION:  Larry [name] is a 71 year old gentleman with a history of splenic lymphoma.  He underwent a splenectomy for this back in 2009.  He had one  cycle of adjuvant Rituxan completed in April of 2010.  I do not see any evidence of recurrence of this.  He will continue his testosterone.  This does help him out.  We will go ahead and plan to get him back monthly for the testosterone. I will see him back in about 3 or 4 months.    ______________________________ Josph Macho, M.D. PRE/MEDQ  D:  04/04/2012  T:  04/05/2012  Job:  [id number]

## 2012-04-18 DIAGNOSIS — R21 Rash and other nonspecific skin eruption: Secondary | ICD-10-CM | POA: Diagnosis not present

## 2012-04-18 DIAGNOSIS — E119 Type 2 diabetes mellitus without complications: Secondary | ICD-10-CM | POA: Diagnosis not present

## 2012-04-18 DIAGNOSIS — K219 Gastro-esophageal reflux disease without esophagitis: Secondary | ICD-10-CM | POA: Diagnosis not present

## 2012-04-18 DIAGNOSIS — I10 Essential (primary) hypertension: Secondary | ICD-10-CM | POA: Diagnosis not present

## 2012-05-06 ENCOUNTER — Ambulatory Visit: Payer: Medicare Other

## 2012-05-13 ENCOUNTER — Ambulatory Visit (HOSPITAL_BASED_OUTPATIENT_CLINIC_OR_DEPARTMENT_OTHER): Payer: Medicare Other

## 2012-05-13 VITALS — BP 157/68 | HR 75 | Temp 97.0°F | Resp 18

## 2012-05-13 DIAGNOSIS — E349 Endocrine disorder, unspecified: Secondary | ICD-10-CM

## 2012-05-13 DIAGNOSIS — E291 Testicular hypofunction: Secondary | ICD-10-CM | POA: Diagnosis not present

## 2012-05-13 MED ORDER — TESTOSTERONE CYPIONATE 200 MG/ML IM SOLN
200.0000 mg | Freq: Once | INTRAMUSCULAR | Status: AC
  Administered 2012-05-13: 200 mg via INTRAMUSCULAR

## 2012-05-13 NOTE — Patient Instructions (Signed)
Testosterone injection What is this medicine? TESTOSTERONE (tes TOS ter one) is the main male hormone. It supports normal male development such as muscle growth, facial hair, and deep voice. It is used in males to treat low testosterone levels. This medicine may be used for other purposes; ask your health care provider or pharmacist if you have questions. What should I tell my health care provider before I take this medicine? They need to know if you have any of these conditions: -breast cancer -diabetes -heart disease -kidney disease -liver disease -lung disease -prostate cancer, enlargement -an unusual or allergic reaction to testosterone, other medicines, foods, dyes, or preservatives -pregnant or trying to get pregnant -breast-feeding How should I use this medicine? This medicine is for injection into a muscle. It is usually given by a health care professional in a hospital or clinic setting. Contact your pediatrician regarding the use of this medicine in children. While this medicine may be prescribed for children as young as 12 years of age for selected conditions, precautions do apply. Overdosage: If you think you have taken too much of this medicine contact a poison control center or emergency room at once. NOTE: This medicine is only for you. Do not share this medicine with others. What if I miss a dose? Try not to miss a dose. Your doctor or health care professional will tell you when your next injection is due. Notify the office if you are unable to keep an appointment. What may interact with this medicine? -medicines for diabetes -medicines that treat or prevent blood clots like warfarin -oxyphenbutazone -propranolol -steroid medicines like prednisone or cortisone This list may not describe all possible interactions. Give your health care provider a list of all the medicines, herbs, non-prescription drugs, or dietary supplements you use. Also tell them if you smoke, drink  alcohol, or use illegal drugs. Some items may interact with your medicine. What should I watch for while using this medicine? Visit your doctor or health care professional for regular checks on your progress. They will need to check the level of testosterone in your blood. This medicine may affect blood sugar levels. If you have diabetes, check with your doctor or health care professional before you change your diet or the dose of your diabetic medicine. This drug is banned from use in athletes by most athletic organizations. What side effects may I notice from receiving this medicine? Side effects that you should report to your doctor or health care professional as soon as possible: -allergic reactions like skin rash, itching or hives, swelling of the face, lips, or tongue -breast enlargement -breathing problems -changes in mood, especially anger, depression, or rage -dark urine -general ill feeling or flu-like symptoms -light-colored stools -loss of appetite, nausea -nausea, vomiting -right upper belly pain -stomach pain -swelling of ankles -too frequent or persistent erections -trouble passing urine or change in the amount of urine -unusually weak or tired -yellowing of the eyes or skin Additional side effects that can occur in women include: -deep or hoarse voice -facial hair growth -irregular menstrual periods Side effects that usually do not require medical attention (report to your doctor or health care professional if they continue or are bothersome): -acne -change in sex drive or performance -hair loss -headache This list may not describe all possible side effects. Call your doctor for medical advice about side effects. You may report side effects to FDA at 1-800-FDA-1088. Where should I keep my medicine? Keep out of the reach of children. This medicine   can be abused. Keep your medicine in a safe place to protect it from theft. Do not share this medicine with anyone.  Selling or giving away this medicine is dangerous and against the law. Store at room temperature between 20 and 25 degrees C (68 and 77 degrees F). Do not freeze. Protect from light. Follow the directions for the product you are prescribed. Throw away any unused medicine after the expiration date. NOTE: This sheet is a summary. It may not cover all possible information. If you have questions about this medicine, talk to your doctor, pharmacist, or health care provider.  2013, [employer]. (06/28/2007 4:13:46 PM)  

## 2012-06-10 ENCOUNTER — Ambulatory Visit (HOSPITAL_BASED_OUTPATIENT_CLINIC_OR_DEPARTMENT_OTHER): Payer: Medicare Other

## 2012-06-10 VITALS — BP 156/64 | HR 70 | Temp 97.5°F | Resp 18

## 2012-06-10 DIAGNOSIS — E291 Testicular hypofunction: Secondary | ICD-10-CM

## 2012-06-10 DIAGNOSIS — E349 Endocrine disorder, unspecified: Secondary | ICD-10-CM

## 2012-06-10 MED ORDER — TESTOSTERONE CYPIONATE 200 MG/ML IM SOLN
200.0000 mg | INTRAMUSCULAR | Status: DC
  Administered 2012-06-10: 200 mg via INTRAMUSCULAR

## 2012-06-10 NOTE — Patient Instructions (Signed)
Testosterone injection What is this medicine? TESTOSTERONE (tes TOS ter one) is the main male hormone. It supports normal male development such as muscle growth, facial hair, and deep voice. It is used in males to treat low testosterone levels. This medicine may be used for other purposes; ask your health care provider or pharmacist if you have questions. What should I tell my health care provider before I take this medicine? They need to know if you have any of these conditions: -breast cancer -diabetes -heart disease -kidney disease -liver disease -lung disease -prostate cancer, enlargement -an unusual or allergic reaction to testosterone, other medicines, foods, dyes, or preservatives -pregnant or trying to get pregnant -breast-feeding How should I use this medicine? This medicine is for injection into a muscle. It is usually given by a health care professional in a hospital or clinic setting. Contact your pediatrician regarding the use of this medicine in children. While this medicine may be prescribed for children as young as 12 years of age for selected conditions, precautions do apply. Overdosage: If you think you have taken too much of this medicine contact a poison control center or emergency room at once. NOTE: This medicine is only for you. Do not share this medicine with others. What if I miss a dose? Try not to miss a dose. Your doctor or health care professional will tell you when your next injection is due. Notify the office if you are unable to keep an appointment. What may interact with this medicine? -medicines for diabetes -medicines that treat or prevent blood clots like warfarin -oxyphenbutazone -propranolol -steroid medicines like prednisone or cortisone This list may not describe all possible interactions. Give your health care provider a list of all the medicines, herbs, non-prescription drugs, or dietary supplements you use. Also tell them if you smoke, drink  alcohol, or use illegal drugs. Some items may interact with your medicine. What should I watch for while using this medicine? Visit your doctor or health care professional for regular checks on your progress. They will need to check the level of testosterone in your blood. This medicine may affect blood sugar levels. If you have diabetes, check with your doctor or health care professional before you change your diet or the dose of your diabetic medicine. This drug is banned from use in athletes by most athletic organizations. What side effects may I notice from receiving this medicine? Side effects that you should report to your doctor or health care professional as soon as possible: -allergic reactions like skin rash, itching or hives, swelling of the face, lips, or tongue -breast enlargement -breathing problems -changes in mood, especially anger, depression, or rage -dark urine -general ill feeling or flu-like symptoms -light-colored stools -loss of appetite, nausea -nausea, vomiting -right upper belly pain -stomach pain -swelling of ankles -too frequent or persistent erections -trouble passing urine or change in the amount of urine -unusually weak or tired -yellowing of the eyes or skin Additional side effects that can occur in women include: -deep or hoarse voice -facial hair growth -irregular menstrual periods Side effects that usually do not require medical attention (report to your doctor or health care professional if they continue or are bothersome): -acne -change in sex drive or performance -hair loss -headache This list may not describe all possible side effects. Call your doctor for medical advice about side effects. You may report side effects to FDA at 1-800-FDA-1088. Where should I keep my medicine? Keep out of the reach of children. This medicine   can be abused. Keep your medicine in a safe place to protect it from theft. Do not share this medicine with anyone.  Selling or giving away this medicine is dangerous and against the law. Store at room temperature between 20 and 25 degrees C (68 and 77 degrees F). Do not freeze. Protect from light. Follow the directions for the product you are prescribed. Throw away any unused medicine after the expiration date. NOTE: This sheet is a summary. It may not cover all possible information. If you have questions about this medicine, talk to your doctor, pharmacist, or health care provider.  2013, [employer]. (06/28/2007 4:13:46 PM)  

## 2012-06-21 DIAGNOSIS — M109 Gout, unspecified: Secondary | ICD-10-CM | POA: Diagnosis not present

## 2012-06-21 DIAGNOSIS — N2581 Secondary hyperparathyroidism of renal origin: Secondary | ICD-10-CM | POA: Diagnosis not present

## 2012-06-25 DIAGNOSIS — C8589 Other specified types of non-Hodgkin lymphoma, extranodal and solid organ sites: Secondary | ICD-10-CM | POA: Diagnosis not present

## 2012-06-25 DIAGNOSIS — I129 Hypertensive chronic kidney disease with stage 1 through stage 4 chronic kidney disease, or unspecified chronic kidney disease: Secondary | ICD-10-CM | POA: Diagnosis not present

## 2012-06-27 ENCOUNTER — Encounter: Payer: Self-pay | Admitting: Internal Medicine

## 2012-07-08 ENCOUNTER — Ambulatory Visit: Payer: Medicare Other

## 2012-07-09 ENCOUNTER — Ambulatory Visit (HOSPITAL_BASED_OUTPATIENT_CLINIC_OR_DEPARTMENT_OTHER): Payer: Medicare Other

## 2012-07-09 VITALS — BP 144/72 | HR 62 | Temp 96.4°F | Resp 16

## 2012-07-09 DIAGNOSIS — E349 Endocrine disorder, unspecified: Secondary | ICD-10-CM

## 2012-07-09 DIAGNOSIS — E291 Testicular hypofunction: Secondary | ICD-10-CM | POA: Diagnosis not present

## 2012-07-09 MED ORDER — TESTOSTERONE CYPIONATE 200 MG/ML IM SOLN
200.0000 mg | INTRAMUSCULAR | Status: DC
  Administered 2012-07-09: 200 mg via INTRAMUSCULAR

## 2012-07-30 ENCOUNTER — Encounter: Payer: Self-pay | Admitting: Internal Medicine

## 2012-08-01 ENCOUNTER — Encounter: Payer: PRIVATE HEALTH INSURANCE | Admitting: Internal Medicine

## 2012-08-05 ENCOUNTER — Other Ambulatory Visit (HOSPITAL_BASED_OUTPATIENT_CLINIC_OR_DEPARTMENT_OTHER): Payer: Medicare Other | Admitting: Hematology & Oncology

## 2012-08-05 ENCOUNTER — Ambulatory Visit (HOSPITAL_BASED_OUTPATIENT_CLINIC_OR_DEPARTMENT_OTHER): Payer: Medicare Other

## 2012-08-05 ENCOUNTER — Ambulatory Visit: Payer: Medicare Other | Admitting: Lab

## 2012-08-05 ENCOUNTER — Ambulatory Visit (HOSPITAL_BASED_OUTPATIENT_CLINIC_OR_DEPARTMENT_OTHER): Payer: Medicare Other | Admitting: Hematology & Oncology

## 2012-08-05 VITALS — BP 147/58 | HR 74 | Temp 98.0°F | Resp 18 | Ht 68.0 in | Wt 179.0 lb

## 2012-08-05 DIAGNOSIS — E291 Testicular hypofunction: Secondary | ICD-10-CM | POA: Diagnosis not present

## 2012-08-05 DIAGNOSIS — C8307 Small cell B-cell lymphoma, spleen: Secondary | ICD-10-CM

## 2012-08-05 DIAGNOSIS — M109 Gout, unspecified: Secondary | ICD-10-CM | POA: Diagnosis not present

## 2012-08-05 DIAGNOSIS — E349 Endocrine disorder, unspecified: Secondary | ICD-10-CM

## 2012-08-05 DIAGNOSIS — N189 Chronic kidney disease, unspecified: Secondary | ICD-10-CM

## 2012-08-05 LAB — CBC WITH DIFFERENTIAL (CANCER CENTER ONLY)
BASO#: 0 10*3/uL (ref 0.0–0.2)
EOS%: 5 % (ref 0.0–7.0)
HGB: 12.7 g/dL — ABNORMAL LOW (ref 13.0–17.1)
MCH: 34 pg — ABNORMAL HIGH (ref 28.0–33.4)
MCHC: 34.4 g/dL (ref 32.0–35.9)
MONO%: 14.9 % — ABNORMAL HIGH (ref 0.0–13.0)
NEUT#: 8.5 10*3/uL — ABNORMAL HIGH (ref 1.5–6.5)

## 2012-08-05 LAB — BASIC METABOLIC PANEL - CANCER CENTER ONLY
BUN, Bld: 36 mg/dL — ABNORMAL HIGH (ref 7–22)
CO2: 27 mEq/L (ref 18–33)
Calcium: 9.3 mg/dL (ref 8.0–10.3)
Creat: 1.6 mg/dl — ABNORMAL HIGH (ref 0.6–1.2)

## 2012-08-05 LAB — LACTATE DEHYDROGENASE: LDH: 186 U/L (ref 94–250)

## 2012-08-05 MED ORDER — TESTOSTERONE CYPIONATE 200 MG/ML IM SOLN
200.0000 mg | INTRAMUSCULAR | Status: DC
  Administered 2012-08-05: 200 mg via INTRAMUSCULAR

## 2012-08-05 MED ORDER — PREDNISONE 5 MG PO TABS: ORAL_TABLET | ORAL | Status: DC

## 2012-08-05 NOTE — Progress Notes (Signed)
This office note has been dictated.

## 2012-08-05 NOTE — Patient Instructions (Signed)
Testosterone injection What is this medicine? TESTOSTERONE (tes TOS ter one) is the main male hormone. It supports normal male development such as muscle growth, facial hair, and deep voice. It is used in males to treat low testosterone levels. This medicine may be used for other purposes; ask your health care provider or pharmacist if you have questions. What should I tell my health care provider before I take this medicine? They need to know if you have any of these conditions: -breast cancer -diabetes -heart disease -kidney disease -liver disease -lung disease -prostate cancer, enlargement -an unusual or allergic reaction to testosterone, other medicines, foods, dyes, or preservatives -pregnant or trying to get pregnant -breast-feeding How should I use this medicine? This medicine is for injection into a muscle. It is usually given by a health care professional in a hospital or clinic setting. Contact your pediatrician regarding the use of this medicine in children. While this medicine may be prescribed for children as young as 12 years of age for selected conditions, precautions do apply. Overdosage: If you think you have taken too much of this medicine contact a poison control center or emergency room at once. NOTE: This medicine is only for you. Do not share this medicine with others. What if I miss a dose? Try not to miss a dose. Your doctor or health care professional will tell you when your next injection is due. Notify the office if you are unable to keep an appointment. What may interact with this medicine? -medicines for diabetes -medicines that treat or prevent blood clots like warfarin -oxyphenbutazone -propranolol -steroid medicines like prednisone or cortisone This list may not describe all possible interactions. Give your health care provider a list of all the medicines, herbs, non-prescription drugs, or dietary supplements you use. Also tell them if you smoke, drink  alcohol, or use illegal drugs. Some items may interact with your medicine. What should I watch for while using this medicine? Visit your doctor or health care professional for regular checks on your progress. They will need to check the level of testosterone in your blood. This medicine may affect blood sugar levels. If you have diabetes, check with your doctor or health care professional before you change your diet or the dose of your diabetic medicine. This drug is banned from use in athletes by most athletic organizations. What side effects may I notice from receiving this medicine? Side effects that you should report to your doctor or health care professional as soon as possible: -allergic reactions like skin rash, itching or hives, swelling of the face, lips, or tongue -breast enlargement -breathing problems -changes in mood, especially anger, depression, or rage -dark urine -general ill feeling or flu-like symptoms -light-colored stools -loss of appetite, nausea -nausea, vomiting -right upper belly pain -stomach pain -swelling of ankles -too frequent or persistent erections -trouble passing urine or change in the amount of urine -unusually weak or tired -yellowing of the eyes or skin Additional side effects that can occur in women include: -deep or hoarse voice -facial hair growth -irregular menstrual periods Side effects that usually do not require medical attention (report to your doctor or health care professional if they continue or are bothersome): -acne -change in sex drive or performance -hair loss -headache This list may not describe all possible side effects. Call your doctor for medical advice about side effects. You may report side effects to FDA at 1-800-FDA-1088. Where should I keep my medicine? Keep out of the reach of children. This medicine   can be abused. Keep your medicine in a safe place to protect it from theft. Do not share this medicine with anyone.  Selling or giving away this medicine is dangerous and against the law. Store at room temperature between 20 and 25 degrees C (68 and 77 degrees F). Do not freeze. Protect from light. Follow the directions for the product you are prescribed. Throw away any unused medicine after the expiration date. NOTE: This sheet is a summary. It may not cover all possible information. If you have questions about this medicine, talk to your doctor, pharmacist, or health care provider.  2012, [employer]. (06/28/2007 4:13:46 PM) 

## 2012-08-06 NOTE — Progress Notes (Signed)
CC:   Larry [name] Section, M.D. Larry Liner, MD  DIAGNOSES: 1. Splenic marginal zone lymphoma. 2. Recurrent gouty attack. 3. Chronic renal insufficiency. 4. Hypotestosteronemia.  CURRENT THERAPY:  Testosterone 200 mg IM q.4 weeks.  INTERVAL HISTORY:  Larry [name] comes in for his followup.  He is doing okay.  His renal function seems to be holding relatively stable.  He says the testosterone does seem to help him out.  He is having a problem with gout.  He had a gouty attack on his right hand a week ago.  He now has one on his left hand.  This is with his second MCP joint.  I went ahead and gave him a prescription for prednisone to take which seems to help.  He has had no bruising or bleeding.  There has been no fever.  He has had no nausea or vomiting.  Overall, his performance status is ECOG 1.  PHYSICAL EXAMINATION:  General:  This is an elderly appearing, white gentleman in no obvious distress.  Vital signs:  Show temperature of 98, pulse 74, respiratory rate 18, blood pressure 147/58.  Weight is 179. Head and neck:  Shows a normocephalic, atraumatic skull.  There are no ocular or oral lesions.  There are no palpable cervical or supraclavicular lymph nodes.  Lungs:  Clear bilaterally.  Cardiac: Regular rate and rhythm with a normal S1 and S2.  There are no murmurs, rubs or bruits.  Abdomen:  Soft with good bowel sounds.  There is no palpable abdominal mass.  There is no fluid wave.  He has a well-healed splenectomy scar.  There is no palpable hepatomegaly.  Extremities: Show some trace edema in his legs.  He does have a focal area of erythema and swelling about the second MCP joint.  LABORATORY STUDIES:  Labs are pending.  IMPRESSION:  Larry [name] is a 72 year old gentleman with history of splenic marginal zone lymphoma.  This was resected.  He underwent splenectomy back in 2009.  I did give him some Rituxan.  He had 1 cycle of Rituxan in the "adjuvant" setting that was  completed back in April 2010.  For now, I do not see any evidence of recurrence.  Hopefully, the prednisone will help with his gout.  Of note, his last CT of his abdomen and pelvis was done back in I think April of 2013.  Everything looked okay with this.  There were slightly enlarged gastrohepatic and celiac nodes.    ______________________________ Josph Macho, M.D. PRE/MEDQ  D:  08/05/2012  T:  08/06/2012  Job:  [id number]

## 2012-08-26 DIAGNOSIS — K219 Gastro-esophageal reflux disease without esophagitis: Secondary | ICD-10-CM | POA: Diagnosis not present

## 2012-08-26 DIAGNOSIS — M109 Gout, unspecified: Secondary | ICD-10-CM | POA: Diagnosis not present

## 2012-08-26 DIAGNOSIS — E119 Type 2 diabetes mellitus without complications: Secondary | ICD-10-CM | POA: Diagnosis not present

## 2012-08-26 DIAGNOSIS — I10 Essential (primary) hypertension: Secondary | ICD-10-CM | POA: Diagnosis not present

## 2012-09-02 ENCOUNTER — Ambulatory Visit (HOSPITAL_BASED_OUTPATIENT_CLINIC_OR_DEPARTMENT_OTHER): Payer: Medicare Other

## 2012-09-02 VITALS — BP 154/65 | HR 67 | Temp 97.3°F | Resp 16

## 2012-09-02 DIAGNOSIS — E349 Endocrine disorder, unspecified: Secondary | ICD-10-CM

## 2012-09-02 DIAGNOSIS — E291 Testicular hypofunction: Secondary | ICD-10-CM

## 2012-09-02 MED ORDER — TESTOSTERONE CYPIONATE 200 MG/ML IM SOLN
200.0000 mg | INTRAMUSCULAR | Status: DC
  Administered 2012-09-02: 200 mg via INTRAMUSCULAR

## 2012-09-02 NOTE — Patient Instructions (Signed)
Testosterone injection What is this medicine? TESTOSTERONE (tes TOS ter one) is the main male hormone. It supports normal male development such as muscle growth, facial hair, and deep voice. It is used in males to treat low testosterone levels. This medicine may be used for other purposes; ask your health care provider or pharmacist if you have questions. What should I tell my health care provider before I take this medicine? They need to know if you have any of these conditions: -breast cancer -diabetes -heart disease -kidney disease -liver disease -lung disease -prostate cancer, enlargement -an unusual or allergic reaction to testosterone, other medicines, foods, dyes, or preservatives -pregnant or trying to get pregnant -breast-feeding How should I use this medicine? This medicine is for injection into a muscle. It is usually given by a health care professional in a hospital or clinic setting. Contact your pediatrician regarding the use of this medicine in children. While this medicine may be prescribed for children as young as 12 years of age for selected conditions, precautions do apply. Overdosage: If you think you have taken too much of this medicine contact a poison control center or emergency room at once. NOTE: This medicine is only for you. Do not share this medicine with others. What if I miss a dose? Try not to miss a dose. Your doctor or health care professional will tell you when your next injection is due. Notify the office if you are unable to keep an appointment. What may interact with this medicine? -medicines for diabetes -medicines that treat or prevent blood clots like warfarin -oxyphenbutazone -propranolol -steroid medicines like prednisone or cortisone This list may not describe all possible interactions. Give your health care provider a list of all the medicines, herbs, non-prescription drugs, or dietary supplements you use. Also tell them if you smoke, drink  alcohol, or use illegal drugs. Some items may interact with your medicine. What should I watch for while using this medicine? Visit your doctor or health care professional for regular checks on your progress. They will need to check the level of testosterone in your blood. This medicine may affect blood sugar levels. If you have diabetes, check with your doctor or health care professional before you change your diet or the dose of your diabetic medicine. This drug is banned from use in athletes by most athletic organizations. What side effects may I notice from receiving this medicine? Side effects that you should report to your doctor or health care professional as soon as possible: -allergic reactions like skin rash, itching or hives, swelling of the face, lips, or tongue -breast enlargement -breathing problems -changes in mood, especially anger, depression, or rage -dark urine -general ill feeling or flu-like symptoms -light-colored stools -loss of appetite, nausea -nausea, vomiting -right upper belly pain -stomach pain -swelling of ankles -too frequent or persistent erections -trouble passing urine or change in the amount of urine -unusually weak or tired -yellowing of the eyes or skin Additional side effects that can occur in women include: -deep or hoarse voice -facial hair growth -irregular menstrual periods Side effects that usually do not require medical attention (report to your doctor or health care professional if they continue or are bothersome): -acne -change in sex drive or performance -hair loss -headache This list may not describe all possible side effects. Call your doctor for medical advice about side effects. You may report side effects to FDA at 1-800-FDA-1088. Where should I keep my medicine? Keep out of the reach of children. This medicine   can be abused. Keep your medicine in a safe place to protect it from theft. Do not share this medicine with anyone.  Selling or giving away this medicine is dangerous and against the law. Store at room temperature between 20 and 25 degrees C (68 and 77 degrees F). Do not freeze. Protect from light. Follow the directions for the product you are prescribed. Throw away any unused medicine after the expiration date. NOTE: This sheet is a summary. It may not cover all possible information. If you have questions about this medicine, talk to your doctor, pharmacist, or health care provider.  2012, [employer]. (06/28/2007 4:13:46 PM) 

## 2012-09-30 ENCOUNTER — Ambulatory Visit (HOSPITAL_BASED_OUTPATIENT_CLINIC_OR_DEPARTMENT_OTHER): Payer: Medicare Other

## 2012-09-30 VITALS — BP 156/66 | HR 79 | Temp 97.8°F | Resp 18

## 2012-09-30 DIAGNOSIS — E291 Testicular hypofunction: Secondary | ICD-10-CM | POA: Diagnosis not present

## 2012-09-30 DIAGNOSIS — E349 Endocrine disorder, unspecified: Secondary | ICD-10-CM

## 2012-09-30 MED ORDER — TESTOSTERONE CYPIONATE 200 MG/ML IM SOLN
200.0000 mg | INTRAMUSCULAR | Status: DC
  Administered 2012-09-30: 200 mg via INTRAMUSCULAR

## 2012-09-30 NOTE — Patient Instructions (Signed)
Testosterone injection What is this medicine? TESTOSTERONE (tes TOS ter one) is the main male hormone. It supports normal male development such as muscle growth, facial hair, and deep voice. It is used in males to treat low testosterone levels. This medicine may be used for other purposes; ask your health care provider or pharmacist if you have questions. What should I tell my health care provider before I take this medicine? They need to know if you have any of these conditions: -breast cancer -diabetes -heart disease -kidney disease -liver disease -lung disease -prostate cancer, enlargement -an unusual or allergic reaction to testosterone, other medicines, foods, dyes, or preservatives -pregnant or trying to get pregnant -breast-feeding How should I use this medicine? This medicine is for injection into a muscle. It is usually given by a health care professional in a hospital or clinic setting. Contact your pediatrician regarding the use of this medicine in children. While this medicine may be prescribed for children as young as 12 years of age for selected conditions, precautions do apply. Overdosage: If you think you have taken too much of this medicine contact a poison control center or emergency room at once. NOTE: This medicine is only for you. Do not share this medicine with others. What if I miss a dose? Try not to miss a dose. Your doctor or health care professional will tell you when your next injection is due. Notify the office if you are unable to keep an appointment. What may interact with this medicine? -medicines for diabetes -medicines that treat or prevent blood clots like warfarin -oxyphenbutazone -propranolol -steroid medicines like prednisone or cortisone This list may not describe all possible interactions. Give your health care provider a list of all the medicines, herbs, non-prescription drugs, or dietary supplements you use. Also tell them if you smoke, drink  alcohol, or use illegal drugs. Some items may interact with your medicine. What should I watch for while using this medicine? Visit your doctor or health care professional for regular checks on your progress. They will need to check the level of testosterone in your blood. This medicine may affect blood sugar levels. If you have diabetes, check with your doctor or health care professional before you change your diet or the dose of your diabetic medicine. This drug is banned from use in athletes by most athletic organizations. What side effects may I notice from receiving this medicine? Side effects that you should report to your doctor or health care professional as soon as possible: -allergic reactions like skin rash, itching or hives, swelling of the face, lips, or tongue -breast enlargement -breathing problems -changes in mood, especially anger, depression, or rage -dark urine -general ill feeling or flu-like symptoms -light-colored stools -loss of appetite, nausea -nausea, vomiting -right upper belly pain -stomach pain -swelling of ankles -too frequent or persistent erections -trouble passing urine or change in the amount of urine -unusually weak or tired -yellowing of the eyes or skin Additional side effects that can occur in women include: -deep or hoarse voice -facial hair growth -irregular menstrual periods Side effects that usually do not require medical attention (report to your doctor or health care professional if they continue or are bothersome): -acne -change in sex drive or performance -hair loss -headache This list may not describe all possible side effects. Call your doctor for medical advice about side effects. You may report side effects to FDA at 1-800-FDA-1088. Where should I keep my medicine? Keep out of the reach of children. This medicine   can be abused. Keep your medicine in a safe place to protect it from theft. Do not share this medicine with anyone.  Selling or giving away this medicine is dangerous and against the law. Store at room temperature between 20 and 25 degrees C (68 and 77 degrees F). Do not freeze. Protect from light. Follow the directions for the product you are prescribed. Throw away any unused medicine after the expiration date. NOTE: This sheet is a summary. It may not cover all possible information. If you have questions about this medicine, talk to your doctor, pharmacist, or health care provider.  2012, [employer]. (06/28/2007 4:13:46 PM) 

## 2012-10-28 ENCOUNTER — Other Ambulatory Visit: Payer: Medicare Other | Admitting: Lab

## 2012-10-28 ENCOUNTER — Ambulatory Visit (HOSPITAL_BASED_OUTPATIENT_CLINIC_OR_DEPARTMENT_OTHER): Payer: Medicare Other | Admitting: Hematology & Oncology

## 2012-10-28 ENCOUNTER — Ambulatory Visit: Payer: Medicare Other

## 2012-10-28 ENCOUNTER — Ambulatory Visit (HOSPITAL_BASED_OUTPATIENT_CLINIC_OR_DEPARTMENT_OTHER): Payer: Medicare Other

## 2012-10-28 ENCOUNTER — Other Ambulatory Visit (HOSPITAL_BASED_OUTPATIENT_CLINIC_OR_DEPARTMENT_OTHER): Payer: Medicare Other | Admitting: Lab

## 2012-10-28 ENCOUNTER — Ambulatory Visit: Payer: Medicare Other | Admitting: Hematology & Oncology

## 2012-10-28 VITALS — BP 151/58 | HR 79 | Temp 97.4°F | Resp 79 | Ht 68.0 in | Wt 184.0 lb

## 2012-10-28 DIAGNOSIS — C8307 Small cell B-cell lymphoma, spleen: Secondary | ICD-10-CM

## 2012-10-28 DIAGNOSIS — E349 Endocrine disorder, unspecified: Secondary | ICD-10-CM

## 2012-10-28 DIAGNOSIS — E291 Testicular hypofunction: Secondary | ICD-10-CM

## 2012-10-28 DIAGNOSIS — E119 Type 2 diabetes mellitus without complications: Secondary | ICD-10-CM | POA: Diagnosis not present

## 2012-10-28 LAB — BASIC METABOLIC PANEL
Calcium: 9.1 mg/dL (ref 8.4–10.5)
Potassium: 4.3 mEq/L (ref 3.5–5.3)
Sodium: 138 mEq/L (ref 135–145)

## 2012-10-28 LAB — TESTOSTERONE: Testosterone: 291 ng/dL — ABNORMAL LOW (ref 300–890)

## 2012-10-28 LAB — CBC WITH DIFFERENTIAL (CANCER CENTER ONLY)
EOS%: 5.4 % (ref 0.0–7.0)
Eosinophils Absolute: 0.5 10*3/uL (ref 0.0–0.5)
LYMPH#: 1.4 10*3/uL (ref 0.9–3.3)
MCH: 33.6 pg — ABNORMAL HIGH (ref 28.0–33.4)
MCHC: 33.5 g/dL (ref 32.0–35.9)
MONO%: 15.4 % — ABNORMAL HIGH (ref 0.0–13.0)
NEUT#: 5.1 10*3/uL (ref 1.5–6.5)
Platelets: 245 10*3/uL (ref 145–400)
RBC: 3.51 10*6/uL — ABNORMAL LOW (ref 4.20–5.70)

## 2012-10-28 MED ORDER — TESTOSTERONE CYPIONATE 200 MG/ML IM SOLN
200.0000 mg | INTRAMUSCULAR | Status: DC
  Administered 2012-10-28: 200 mg via INTRAMUSCULAR

## 2012-10-28 NOTE — Progress Notes (Signed)
This office note has been dictated.

## 2012-10-29 NOTE — Progress Notes (Signed)
CC:   Larry Liner, MD Larry [name]. Larry [name], M.D.  DIAGNOSES: 1. Splenic marginal zone lymphoma. 2. Hypotestosteronemia. 3. Chronic renal insufficiency.  CURRENT THERAPY:  Testosterone 200 mg IM q.4 weeks.  INTERIM HISTORY:  Larry [name] comes in for his followup.  He is doing pretty well.  He feels okay.  He has had no specific complaints.  He has had no nausea or vomiting.  There has been no change in bowel or bladder habits.  He does have a lot of leg swelling.  This certainly could be from his renal insufficiency.  I suppose it may also be from medications.  He does have diabetes.  He has had no problems with fever.  He was hospitalized a year ago when he had a diverticulitis that perforated.  PHYSICAL EXAMINATION:  General:  This is a well-developed, well- nourished white gentleman in no obvious distress.  Vital Signs: Temperature of 97.4, pulse 79, respiratory rate 18, blood pressure 151/58.  Weight is 184.  Head and Neck:  Normocephalic, atraumatic skull.  There are no ocular or oral lesions.  There are no palpable cervical or supraclavicular lymph nodes.  Lungs:  Clear bilaterally. Cardiac:  Regular rate and rhythm with a normal S1, S2.  There are no murmurs, rubs, or bruits.  Abdomen:  Soft with good bowel sounds.  He has well-healed laparotomy scars.  His splenectomy scar is well-healed. He has no abdominal mass.  There is no fluid wave.  There is no palpable hepatomegaly.  Extremities:  2+ edema in his legs.  He has stasis dermatitis changes in his legs.  LABORATORY STUDIES:  White cell count is 8.3, hemoglobin 11.8, hematocrit 35.2, platelet count 245.  IMPRESSION:  Larry [name] is a very nice 72 year old gentleman with a history of splenic marginal zone lymphoma.  This, in my mind, is not an issue.  He had a splenectomy back in 2009.  He had 1 cycle of Rituxan that was completed in April 2010.  We will go ahead with the testosterone.  We will see what his  levels are.  I will have him come back to see me in another 3 months.   ______________________________ Josph Macho, M.D. PRE/MEDQ  D:  10/28/2012  T:  10/29/2012  Job:  [id number]

## 2012-11-21 DIAGNOSIS — I10 Essential (primary) hypertension: Secondary | ICD-10-CM | POA: Diagnosis not present

## 2012-11-21 DIAGNOSIS — K219 Gastro-esophageal reflux disease without esophagitis: Secondary | ICD-10-CM | POA: Diagnosis not present

## 2012-11-21 DIAGNOSIS — E119 Type 2 diabetes mellitus without complications: Secondary | ICD-10-CM | POA: Diagnosis not present

## 2012-11-27 ENCOUNTER — Ambulatory Visit: Payer: Medicare Other

## 2012-11-27 ENCOUNTER — Ambulatory Visit (HOSPITAL_BASED_OUTPATIENT_CLINIC_OR_DEPARTMENT_OTHER): Payer: Medicare Other

## 2012-11-27 VITALS — BP 145/62 | HR 76 | Temp 97.9°F | Resp 18

## 2012-11-27 DIAGNOSIS — E291 Testicular hypofunction: Secondary | ICD-10-CM

## 2012-11-27 DIAGNOSIS — E349 Endocrine disorder, unspecified: Secondary | ICD-10-CM

## 2012-11-27 MED ORDER — TESTOSTERONE CYPIONATE 200 MG/ML IM SOLN
200.0000 mg | INTRAMUSCULAR | Status: DC
  Administered 2012-11-27: 200 mg via INTRAMUSCULAR

## 2012-11-27 NOTE — Patient Instructions (Addendum)
Testosterone injection What is this medicine? TESTOSTERONE (tes TOS ter one) is the main male hormone. It supports normal male development such as muscle growth, facial hair, and deep voice. It is used in males to treat low testosterone levels. This medicine may be used for other purposes; ask your health care provider or pharmacist if you have questions. What should I tell my health care provider before I take this medicine? They need to know if you have any of these conditions: -breast cancer -diabetes -heart disease -kidney disease -liver disease -lung disease -prostate cancer, enlargement -an unusual or allergic reaction to testosterone, other medicines, foods, dyes, or preservatives -pregnant or trying to get pregnant -breast-feeding How should I use this medicine? This medicine is for injection into a muscle. It is usually given by a health care professional in a hospital or clinic setting. Contact your pediatrician regarding the use of this medicine in children. While this medicine may be prescribed for children as young as 12 years of age for selected conditions, precautions do apply. Overdosage: If you think you have taken too much of this medicine contact a poison control center or emergency room at once. NOTE: This medicine is only for you. Do not share this medicine with others. What if I miss a dose? Try not to miss a dose. Your doctor or health care professional will tell you when your next injection is due. Notify the office if you are unable to keep an appointment. What may interact with this medicine? -medicines for diabetes -medicines that treat or prevent blood clots like warfarin -oxyphenbutazone -propranolol -steroid medicines like prednisone or cortisone This list may not describe all possible interactions. Give your health care provider a list of all the medicines, herbs, non-prescription drugs, or dietary supplements you use. Also tell them if you smoke, drink  alcohol, or use illegal drugs. Some items may interact with your medicine. What should I watch for while using this medicine? Visit your doctor or health care professional for regular checks on your progress. They will need to check the level of testosterone in your blood. This medicine may affect blood sugar levels. If you have diabetes, check with your doctor or health care professional before you change your diet or the dose of your diabetic medicine. This drug is banned from use in athletes by most athletic organizations. What side effects may I notice from receiving this medicine? Side effects that you should report to your doctor or health care professional as soon as possible: -allergic reactions like skin rash, itching or hives, swelling of the face, lips, or tongue -breast enlargement -breathing problems -changes in mood, especially anger, depression, or rage -dark urine -general ill feeling or flu-like symptoms -light-colored stools -loss of appetite, nausea -nausea, vomiting -right upper belly pain -stomach pain -swelling of ankles -too frequent or persistent erections -trouble passing urine or change in the amount of urine -unusually weak or tired -yellowing of the eyes or skin Additional side effects that can occur in women include: -deep or hoarse voice -facial hair growth -irregular menstrual periods Side effects that usually do not require medical attention (report to your doctor or health care professional if they continue or are bothersome): -acne -change in sex drive or performance -hair loss -headache This list may not describe all possible side effects. Call your doctor for medical advice about side effects. You may report side effects to FDA at 1-800-FDA-1088. Where should I keep my medicine? Keep out of the reach of children. This medicine   can be abused. Keep your medicine in a safe place to protect it from theft. Do not share this medicine with anyone.  Selling or giving away this medicine is dangerous and against the law. Store at room temperature between 20 and 25 degrees C (68 and 77 degrees F). Do not freeze. Protect from light. Follow the directions for the product you are prescribed. Throw away any unused medicine after the expiration date. NOTE: This sheet is a summary. It may not cover all possible information. If you have questions about this medicine, talk to your doctor, pharmacist, or health care provider.  2012, [employer]vier/[employer] Standard. (06/28/2007 4:13:46 PM) 

## 2012-12-02 ENCOUNTER — Ambulatory Visit: Payer: Medicare Other

## 2012-12-27 ENCOUNTER — Ambulatory Visit (HOSPITAL_BASED_OUTPATIENT_CLINIC_OR_DEPARTMENT_OTHER): Payer: Medicare Other

## 2012-12-27 VITALS — BP 140/64 | HR 71 | Temp 97.2°F | Resp 18

## 2012-12-27 DIAGNOSIS — E291 Testicular hypofunction: Secondary | ICD-10-CM

## 2012-12-27 DIAGNOSIS — E349 Endocrine disorder, unspecified: Secondary | ICD-10-CM

## 2012-12-27 MED ORDER — TESTOSTERONE CYPIONATE 200 MG/ML IM SOLN
200.0000 mg | INTRAMUSCULAR | Status: DC
  Administered 2012-12-27: 200 mg via INTRAMUSCULAR

## 2013-01-15 DIAGNOSIS — E119 Type 2 diabetes mellitus without complications: Secondary | ICD-10-CM | POA: Diagnosis not present

## 2013-01-28 ENCOUNTER — Ambulatory Visit (HOSPITAL_BASED_OUTPATIENT_CLINIC_OR_DEPARTMENT_OTHER): Payer: Medicare Other

## 2013-01-28 ENCOUNTER — Other Ambulatory Visit: Payer: Medicare Other | Admitting: Lab

## 2013-01-28 ENCOUNTER — Ambulatory Visit (HOSPITAL_BASED_OUTPATIENT_CLINIC_OR_DEPARTMENT_OTHER): Payer: Medicare Other | Admitting: Hematology & Oncology

## 2013-01-28 VITALS — BP 154/58 | HR 71 | Temp 97.8°F | Resp 18 | Ht 68.0 in | Wt 179.0 lb

## 2013-01-28 DIAGNOSIS — N189 Chronic kidney disease, unspecified: Secondary | ICD-10-CM | POA: Diagnosis not present

## 2013-01-28 DIAGNOSIS — C9000 Multiple myeloma not having achieved remission: Secondary | ICD-10-CM | POA: Diagnosis not present

## 2013-01-28 DIAGNOSIS — E349 Endocrine disorder, unspecified: Secondary | ICD-10-CM

## 2013-01-28 DIAGNOSIS — E291 Testicular hypofunction: Secondary | ICD-10-CM

## 2013-01-28 DIAGNOSIS — Z23 Encounter for immunization: Secondary | ICD-10-CM

## 2013-01-28 DIAGNOSIS — E119 Type 2 diabetes mellitus without complications: Secondary | ICD-10-CM

## 2013-01-28 DIAGNOSIS — C8307 Small cell B-cell lymphoma, spleen: Secondary | ICD-10-CM | POA: Diagnosis not present

## 2013-01-28 LAB — CBC WITH DIFFERENTIAL (CANCER CENTER ONLY)
BASO%: 1.4 % (ref 0.0–2.0)
EOS%: 11.5 % — ABNORMAL HIGH (ref 0.0–7.0)
HCT: 37.2 % — ABNORMAL LOW (ref 38.7–49.9)
LYMPH#: 1.6 10*3/uL (ref 0.9–3.3)
LYMPH%: 22.8 % (ref 14.0–48.0)
MCHC: 34.1 g/dL (ref 32.0–35.9)
MCV: 98 fL (ref 82–98)
MONO#: 1 10*3/uL — ABNORMAL HIGH (ref 0.1–0.9)
NEUT#: 3.6 10*3/uL (ref 1.5–6.5)
NEUT%: 50.8 % (ref 40.0–80.0)
Platelets: 244 10*3/uL (ref 145–400)
RBC: 3.8 10*6/uL — ABNORMAL LOW (ref 4.20–5.70)
RDW: 13.8 % (ref 11.1–15.7)
WBC: 7.1 10*3/uL (ref 4.0–10.0)

## 2013-01-28 MED ORDER — TESTOSTERONE CYPIONATE 200 MG/ML IM SOLN
200.0000 mg | INTRAMUSCULAR | Status: DC
  Administered 2013-01-28: 200 mg via INTRAMUSCULAR

## 2013-01-28 MED ORDER — TESTOSTERONE CYPIONATE 200 MG/ML IM SOLN
INTRAMUSCULAR | Status: AC
  Filled 2013-01-28: qty 1

## 2013-01-28 MED ORDER — INFLUENZA VAC SPLIT QUAD 0.5 ML IM SUSP
0.5000 mL | Freq: Once | INTRAMUSCULAR | Status: AC
  Administered 2013-01-28: 0.5 mL via INTRAMUSCULAR
  Filled 2013-01-28: qty 0.5

## 2013-01-28 NOTE — Progress Notes (Signed)
Diagnosis: 1. Splenic marginal zone lymphoma 2. Hypo-testosteronemia 3 chronic renal insufficiency  Current therapy: Testosterone 200 mg IM every 4 weeks  Interval history: [name] comes in for followup. He's doing well. We last saw him back in June. Over the summer, he had no problems. His kidney function seems to be doing fairly well. He's having no problems urinating.  His been no palpable pain. He's had no nausea vomiting. He's had no fevers sweats or chills. There's been no cough or shortness of breath.  His last testosterone level was 291.  He's had no rashes. Has occasional leg swelling.  Physical exam:  This is a well-developed and well-nourished white gentleman in no obvious distress. Vital signs show temperature 97.8. Pulse 71. Blood pressure 154/58. Weight is 179 pounds. Head and neck exam shows no ocular or oral lesions. She has no mucositis. There is no scleral icterus. There is no adenopathy in neck. Thyroid is nonpalpable. Lungs are clear bilaterally. Cardiac exam regular rate and rhythm with a normal S1-S2. There are no murmurs rubs or bruits. Abdomen is soft. Has a well-healed slightly scar. He has no hepatomegaly. Extremities shows some chronic pitting edema in the legs. She has slightly more edema in the right leg the left. He has good range of motion of his joints. Has good strength in his muscles. Neurological exam no focal neurological deficits. Skin exam no rashes ecchymoses or petechia.  Labs:  Pending  Impression:  Larry [name] is a 72 year old gentleman with marginal zone lymphoma of the spleen. Helically underwent splenectomy. He then had one cycle of chemotherapy with the Rituxan. This was completed in April of 2010.  I see no evidence of recurrent disease.  We will go ahead and and give him his testosterone today. This helps him out. This really makes his quality of life better.  We will also give him a flu shot today.  I'll plan to see her back  myself in another 3-4 months.  He comes in monthly for his testosterone.

## 2013-01-28 NOTE — Patient Instructions (Signed)
Testosterone injection What is this medicine? TESTOSTERONE (tes TOS ter one) is the main male hormone. It supports normal male development such as muscle growth, facial hair, and deep voice. It is used in males to treat low testosterone levels. This medicine may be used for other purposes; ask your health care provider or pharmacist if you have questions. What should I tell my health care provider before I take this medicine? They need to know if you have any of these conditions: -breast cancer -diabetes -heart disease -kidney disease -liver disease -lung disease -prostate cancer, enlargement -an unusual or allergic reaction to testosterone, other medicines, foods, dyes, or preservatives -pregnant or trying to get pregnant -breast-feeding How should I use this medicine? This medicine is for injection into a muscle. It is usually given by a health care professional in a hospital or clinic setting. Contact your pediatrician regarding the use of this medicine in children. While this medicine may be prescribed for children as young as 12 years of age for selected conditions, precautions do apply. Overdosage: If you think you have taken too much of this medicine contact a poison control center or emergency room at once. NOTE: This medicine is only for you. Do not share this medicine with others. What if I miss a dose? Try not to miss a dose. Your doctor or health care professional will tell you when your next injection is due. Notify the office if you are unable to keep an appointment. What may interact with this medicine? -medicines for diabetes -medicines that treat or prevent blood clots like warfarin -oxyphenbutazone -propranolol -steroid medicines like prednisone or cortisone This list may not describe all possible interactions. Give your health care provider a list of all the medicines, herbs, non-prescription drugs, or dietary supplements you use. Also tell them if you smoke, drink  alcohol, or use illegal drugs. Some items may interact with your medicine. What should I watch for while using this medicine? Visit your doctor or health care professional for regular checks on your progress. They will need to check the level of testosterone in your blood. This medicine may affect blood sugar levels. If you have diabetes, check with your doctor or health care professional before you change your diet or the dose of your diabetic medicine. This drug is banned from use in athletes by most athletic organizations. What side effects may I notice from receiving this medicine? Side effects that you should report to your doctor or health care professional as soon as possible: -allergic reactions like skin rash, itching or hives, swelling of the face, lips, or tongue -breast enlargement -breathing problems -changes in mood, especially anger, depression, or rage -dark urine -general ill feeling or flu-like symptoms -light-colored stools -loss of appetite, nausea -nausea, vomiting -right upper belly pain -stomach pain -swelling of ankles -too frequent or persistent erections -trouble passing urine or change in the amount of urine -unusually weak or tired -yellowing of the eyes or skin Additional side effects that can occur in women include: -deep or hoarse voice -facial hair growth -irregular menstrual periods Side effects that usually do not require medical attention (report to your doctor or health care professional if they continue or are bothersome): -acne -change in sex drive or performance -hair loss -headache This list may not describe all possible side effects. Call your doctor for medical advice about side effects. You may report side effects to FDA at 1-800-FDA-1088. Where should I keep my medicine? Keep out of the reach of children. This medicine   can be abused. Keep your medicine in a safe place to protect it from theft. Do not share this medicine with anyone.  Selling or giving away this medicine is dangerous and against the law. Store at room temperature between 20 and 25 degrees C (68 and 77 degrees F). Do not freeze. Protect from light. Follow the directions for the product you are prescribed. Throw away any unused medicine after the expiration date. NOTE: This sheet is a summary. It may not cover all possible information. If you have questions about this medicine, talk to your doctor, pharmacist, or health care provider.  2012, [employer]. (06/28/2007 4:13:46 PM) 

## 2013-01-29 LAB — COMPREHENSIVE METABOLIC PANEL
ALT: 15 U/L (ref 0–53)
AST: 19 U/L (ref 0–37)
Alkaline Phosphatase: 88 U/L (ref 39–117)
CO2: 26 mEq/L (ref 19–32)
Chloride: 105 mEq/L (ref 96–112)
Creatinine, Ser: 1.72 mg/dL — ABNORMAL HIGH (ref 0.50–1.35)
Sodium: 140 mEq/L (ref 135–145)
Total Bilirubin: 0.6 mg/dL (ref 0.3–1.2)
Total Protein: 7.3 g/dL (ref 6.0–8.3)

## 2013-02-07 ENCOUNTER — Telehealth (HOSPITAL_COMMUNITY): Payer: Self-pay | Admitting: Cardiac Rehabilitation

## 2013-02-25 ENCOUNTER — Ambulatory Visit (HOSPITAL_BASED_OUTPATIENT_CLINIC_OR_DEPARTMENT_OTHER): Payer: Medicare Other

## 2013-02-25 VITALS — BP 158/68 | HR 70 | Temp 96.8°F | Resp 18

## 2013-02-25 DIAGNOSIS — E349 Endocrine disorder, unspecified: Secondary | ICD-10-CM

## 2013-02-25 DIAGNOSIS — E291 Testicular hypofunction: Secondary | ICD-10-CM | POA: Diagnosis not present

## 2013-02-25 MED ORDER — TESTOSTERONE CYPIONATE 200 MG/ML IM SOLN
200.0000 mg | INTRAMUSCULAR | Status: DC
  Administered 2013-02-25: 200 mg via INTRAMUSCULAR

## 2013-02-25 MED ORDER — TESTOSTERONE CYPIONATE 200 MG/ML IM SOLN
INTRAMUSCULAR | Status: AC
  Filled 2013-02-25: qty 1

## 2013-02-25 NOTE — Patient Instructions (Signed)
Testosterone injection What is this medicine? TESTOSTERONE (tes TOS ter one) is the main male hormone. It supports normal male development such as muscle growth, facial hair, and deep voice. It is used in males to treat low testosterone levels. This medicine may be used for other purposes; ask your health care provider or pharmacist if you have questions. What should I tell my health care provider before I take this medicine? They need to know if you have any of these conditions: -breast cancer -diabetes -heart disease -kidney disease -liver disease -lung disease -prostate cancer, enlargement -an unusual or allergic reaction to testosterone, other medicines, foods, dyes, or preservatives -pregnant or trying to get pregnant -breast-feeding How should I use this medicine? This medicine is for injection into a muscle. It is usually given by a health care professional in a hospital or clinic setting. Contact your pediatrician regarding the use of this medicine in children. While this medicine may be prescribed for children as young as 12 years of age for selected conditions, precautions do apply. Overdosage: If you think you have taken too much of this medicine contact a poison control center or emergency room at once. NOTE: This medicine is only for you. Do not share this medicine with others. What if I miss a dose? Try not to miss a dose. Your doctor or health care professional will tell you when your next injection is due. Notify the office if you are unable to keep an appointment. What may interact with this medicine? -medicines for diabetes -medicines that treat or prevent blood clots like warfarin -oxyphenbutazone -propranolol -steroid medicines like prednisone or cortisone This list may not describe all possible interactions. Give your health care provider a list of all the medicines, herbs, non-prescription drugs, or dietary supplements you use. Also tell them if you smoke, drink  alcohol, or use illegal drugs. Some items may interact with your medicine. What should I watch for while using this medicine? Visit your doctor or health care professional for regular checks on your progress. They will need to check the level of testosterone in your blood. This medicine may affect blood sugar levels. If you have diabetes, check with your doctor or health care professional before you change your diet or the dose of your diabetic medicine. This drug is banned from use in athletes by most athletic organizations. What side effects may I notice from receiving this medicine? Side effects that you should report to your doctor or health care professional as soon as possible: -allergic reactions like skin rash, itching or hives, swelling of the face, lips, or tongue -breast enlargement -breathing problems -changes in mood, especially anger, depression, or rage -dark urine -general ill feeling or flu-like symptoms -light-colored stools -loss of appetite, nausea -nausea, vomiting -right upper belly pain -stomach pain -swelling of ankles -too frequent or persistent erections -trouble passing urine or change in the amount of urine -unusually weak or tired -yellowing of the eyes or skin Additional side effects that can occur in women include: -deep or hoarse voice -facial hair growth -irregular menstrual periods Side effects that usually do not require medical attention (report to your doctor or health care professional if they continue or are bothersome): -acne -change in sex drive or performance -hair loss -headache This list may not describe all possible side effects. Call your doctor for medical advice about side effects. You may report side effects to FDA at 1-800-FDA-1088. Where should I keep my medicine? Keep out of the reach of children. This medicine   can be abused. Keep your medicine in a safe place to protect it from theft. Do not share this medicine with anyone.  Selling or giving away this medicine is dangerous and against the law. Store at room temperature between 20 and 25 degrees C (68 and 77 degrees F). Do not freeze. Protect from light. Follow the directions for the product you are prescribed. Throw away any unused medicine after the expiration date. NOTE: This sheet is a summary. It may not cover all possible information. If you have questions about this medicine, talk to your doctor, pharmacist, or health care provider.  2012, [employer]. (06/28/2007 4:13:46 PM) 

## 2013-03-04 ENCOUNTER — Ambulatory Visit: Payer: Medicare Other

## 2013-03-04 ENCOUNTER — Other Ambulatory Visit: Payer: Medicare Other | Admitting: Lab

## 2013-03-04 ENCOUNTER — Ambulatory Visit: Payer: Medicare Other | Admitting: Hematology & Oncology

## 2013-03-25 ENCOUNTER — Ambulatory Visit (HOSPITAL_BASED_OUTPATIENT_CLINIC_OR_DEPARTMENT_OTHER): Payer: Medicare Other

## 2013-03-25 DIAGNOSIS — E291 Testicular hypofunction: Secondary | ICD-10-CM | POA: Diagnosis not present

## 2013-03-25 DIAGNOSIS — E349 Endocrine disorder, unspecified: Secondary | ICD-10-CM

## 2013-03-25 MED ORDER — TESTOSTERONE CYPIONATE 200 MG/ML IM SOLN
200.0000 mg | INTRAMUSCULAR | Status: DC
  Administered 2013-03-25: 200 mg via INTRAMUSCULAR

## 2013-03-25 MED ORDER — TESTOSTERONE CYPIONATE 200 MG/ML IM SOLN
INTRAMUSCULAR | Status: AC
  Filled 2013-03-25: qty 1

## 2013-04-22 ENCOUNTER — Other Ambulatory Visit (HOSPITAL_BASED_OUTPATIENT_CLINIC_OR_DEPARTMENT_OTHER): Payer: Medicare Other | Admitting: Lab

## 2013-04-22 ENCOUNTER — Ambulatory Visit: Payer: Medicare Other

## 2013-04-22 ENCOUNTER — Ambulatory Visit (HOSPITAL_BASED_OUTPATIENT_CLINIC_OR_DEPARTMENT_OTHER): Payer: Medicare Other

## 2013-04-22 ENCOUNTER — Ambulatory Visit (HOSPITAL_BASED_OUTPATIENT_CLINIC_OR_DEPARTMENT_OTHER): Payer: Medicare Other | Admitting: Hematology & Oncology

## 2013-04-22 VITALS — BP 144/52 | HR 66 | Temp 97.9°F | Resp 18 | Ht 68.0 in | Wt 180.0 lb

## 2013-04-22 DIAGNOSIS — E349 Endocrine disorder, unspecified: Secondary | ICD-10-CM

## 2013-04-22 DIAGNOSIS — C8307 Small cell B-cell lymphoma, spleen: Secondary | ICD-10-CM

## 2013-04-22 DIAGNOSIS — Z23 Encounter for immunization: Secondary | ICD-10-CM | POA: Diagnosis not present

## 2013-04-22 DIAGNOSIS — N189 Chronic kidney disease, unspecified: Secondary | ICD-10-CM

## 2013-04-22 DIAGNOSIS — E291 Testicular hypofunction: Secondary | ICD-10-CM

## 2013-04-22 DIAGNOSIS — D509 Iron deficiency anemia, unspecified: Secondary | ICD-10-CM

## 2013-04-22 LAB — CBC WITH DIFFERENTIAL (CANCER CENTER ONLY)
BASO%: 0.8 % (ref 0.0–2.0)
EOS%: 7.1 % — ABNORMAL HIGH (ref 0.0–7.0)
Eosinophils Absolute: 0.5 10*3/uL (ref 0.0–0.5)
HCT: 36.2 % — ABNORMAL LOW (ref 38.7–49.9)
LYMPH%: 23.8 % (ref 14.0–48.0)
MCH: 33.4 pg (ref 28.0–33.4)
MCHC: 34 g/dL (ref 32.0–35.9)
MONO#: 1 10*3/uL — ABNORMAL HIGH (ref 0.1–0.9)
MONO%: 13.6 % — ABNORMAL HIGH (ref 0.0–13.0)
NEUT#: 3.9 10*3/uL (ref 1.5–6.5)
NEUT%: 54.7 % (ref 40.0–80.0)
Platelets: 218 10*3/uL (ref 145–400)
RBC: 3.68 10*6/uL — ABNORMAL LOW (ref 4.20–5.70)
RDW: 13.1 % (ref 11.1–15.7)

## 2013-04-22 LAB — CMP (CANCER CENTER ONLY)
Albumin: 3.8 g/dL (ref 3.3–5.5)
Alkaline Phosphatase: 82 U/L (ref 26–84)
BUN, Bld: 43 mg/dL — ABNORMAL HIGH (ref 7–22)
CO2: 27 mEq/L (ref 18–33)
Creat: 1.6 mg/dl — ABNORMAL HIGH (ref 0.6–1.2)
Glucose, Bld: 203 mg/dL — ABNORMAL HIGH (ref 73–118)
Potassium: 4.4 mEq/L (ref 3.3–4.7)
Total Protein: 7.2 g/dL (ref 6.4–8.1)

## 2013-04-22 LAB — LACTATE DEHYDROGENASE: LDH: 172 U/L (ref 94–250)

## 2013-04-22 LAB — TESTOSTERONE: Testosterone: 264 ng/dL — ABNORMAL LOW (ref 300–890)

## 2013-04-22 MED ORDER — TESTOSTERONE CYPIONATE 200 MG/ML IM SOLN
INTRAMUSCULAR | Status: AC
  Filled 2013-04-22: qty 1

## 2013-04-22 MED ORDER — TESTOSTERONE CYPIONATE 200 MG/ML IM SOLN
200.0000 mg | INTRAMUSCULAR | Status: DC
  Administered 2013-04-22: 200 mg via INTRAMUSCULAR

## 2013-04-22 NOTE — Patient Instructions (Signed)
Testosterone injection What is this medicine? TESTOSTERONE (tes TOS ter one) is the main male hormone. It supports normal male development such as muscle growth, facial hair, and deep voice. It is used in males to treat low testosterone levels. This medicine may be used for other purposes; ask your health care provider or pharmacist if you have questions. COMMON BRAND NAME(S): Andro-L.A., Aveed, Delatestryl, Depo-Testosterone, Virilon What should I tell my health care provider before I take this medicine? They need to know if you have any of these conditions: -breast cancer -diabetes -heart disease -kidney disease -liver disease -lung disease -prostate cancer, enlargement -an unusual or allergic reaction to testosterone, other medicines, foods, dyes, or preservatives -pregnant or trying to get pregnant -breast-feeding How should I use this medicine? This medicine is for injection into a muscle. It is usually given by a health care professional in a hospital or clinic setting. Contact your pediatrician regarding the use of this medicine in children. While this medicine may be prescribed for children as young as 12 years of age for selected conditions, precautions do apply. Overdosage: If you think you have taken too much of this medicine contact a poison control center or emergency room at once. NOTE: This medicine is only for you. Do not share this medicine with others. What if I miss a dose? Try not to miss a dose. Your doctor or health care professional will tell you when your next injection is due. Notify the office if you are unable to keep an appointment. What may interact with this medicine? -medicines for diabetes -medicines that treat or prevent blood clots like warfarin -oxyphenbutazone -propranolol -steroid medicines like prednisone or cortisone This list may not describe all possible interactions. Give your health care provider a list of all the medicines, herbs,  non-prescription drugs, or dietary supplements you use. Also tell them if you smoke, drink alcohol, or use illegal drugs. Some items may interact with your medicine. What should I watch for while using this medicine? Visit your doctor or health care professional for regular checks on your progress. They will need to check the level of testosterone in your blood. This medicine may affect blood sugar levels. If you have diabetes, check with your doctor or health care professional before you change your diet or the dose of your diabetic medicine. This drug is banned from use in athletes by most athletic organizations. What side effects may I notice from receiving this medicine? Side effects that you should report to your doctor or health care professional as soon as possible: -allergic reactions like skin rash, itching or hives, swelling of the face, lips, or tongue -breast enlargement -breathing problems -changes in mood, especially anger, depression, or rage -dark urine -general ill feeling or flu-like symptoms -light-colored stools -loss of appetite, nausea -nausea, vomiting -right upper belly pain -stomach pain -swelling of ankles -too frequent or persistent erections -trouble passing urine or change in the amount of urine -unusually weak or tired -yellowing of the eyes or skin Additional side effects that can occur in women include: -deep or hoarse voice -facial hair growth -irregular menstrual periods Side effects that usually do not require medical attention (report to your doctor or health care professional if they continue or are bothersome): -acne -change in sex drive or performance -hair loss -headache This list may not describe all possible side effects. Call your doctor for medical advice about side effects. You may report side effects to FDA at 1-800-FDA-1088. Where should I keep my medicine? Keep   out of the reach of children. This medicine can be abused. Keep your  medicine in a safe place to protect it from theft. Do not share this medicine with anyone. Selling or giving away this medicine is dangerous and against the law. Store at room temperature between 20 and 25 degrees C (68 and 77 degrees F). Do not freeze. Protect from light. Follow the directions for the product you are prescribed. Throw away any unused medicine after the expiration date. NOTE: This sheet is a summary. It may not cover all possible information. If you have questions about this medicine, talk to your doctor, pharmacist, or health care provider.  2014, Elsevier/Gold Standard. (2007-06-28 16:13:46)  

## 2013-04-25 NOTE — Progress Notes (Signed)
This office note has been dictated.

## 2013-04-28 NOTE — Progress Notes (Signed)
CC:   Larry Liner, MD [facility] Fax [phone]  DIAGNOSES: 1. 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 3. Chronic renal insufficiency.  CURRENT THERAPY:  Testosterone 300 mg IM q.4 months.  INTERIM HISTORY:  Larry [name] comes in for a followup.  He and his wife come in dressed as Larry [name].  They always do this for [place] during the Christmas holiday.  The staff and patients really like when they do that.  Overall, he is doing fairly well.  He has had no further decline in his kidney function.  The testosterone does seem to help him.  His wife, however, says that it does not last all that long.  When his testosterone level gets low, he does tend to get somewhat irritable.  When we last saw him in September, his testosterone level was 334.  He has had no cough or shortness of breath.  He has had no fever, sweats, or chills.  He has had no nausea or vomiting.  There has been no issues with rashes.  He has had no change in his medications.  PHYSICAL EXAMINATION:  General:  This is a well-developed, well- nourished white gentleman in no obvious distress.  Vital Signs: Temperature of 97.9, pulse 66, respiratory rate 18, blood pressure 144/52, weight is 180 pounds.  Head and Neck:  Normocephalic, atraumatic skull.  There are no ocular or oral lesions.  There are no palpable cervical or supraclavicular lymph nodes.  Lungs:  Clear bilaterally. Cardiac:  Regular rate and rhythm with normal S1 and S2.  There are no murmurs, rubs, or bruits.  Abdomen:  Soft.  He has well-healed splenectomy scar.  There is no fluid wave.  There is no palpable hepatosplenomegaly.  Back:  No tenderness over the spine, ribs, or hips. Extremities:  No clubbing, cyanosis, or edema.  Neurologic:  No focal neurological deficits.  LABORATORY STUDIES:  White cell count is 7.1, hemoglobin 12.3, hematocrit 36.2, platelet count 218.  LDH is  172.  BUN 43, creatinine 1.6.  Testosterone is 264.  IMPRESSION:  Larry [name] is a very nice 72 year old gentleman.  He has history of splenic marginal zone lymphoma.  He had a splenectomy back in 2009.  He then did get 1 cycle of Rituxan in April 2010.  So far, I seen no evidence of recurrent lymphoma.  He has renal insufficiency.  He sees Larry [name] for this.  We will monitor his testosterone.  We will increase his testosterone replacement therapy that is 300 mg.  I will plan to see him back myself in another 6 months.  He comes back monthly for his testosterone injections.    ______________________________ Josph Macho, M.D. PRE/MEDQ  D:  04/25/2013  T:  04/26/2013  Job:  [id number]

## 2013-05-20 ENCOUNTER — Ambulatory Visit (HOSPITAL_BASED_OUTPATIENT_CLINIC_OR_DEPARTMENT_OTHER): Payer: Medicare Other

## 2013-05-20 ENCOUNTER — Other Ambulatory Visit: Payer: Medicare Other | Admitting: Lab

## 2013-05-20 VITALS — BP 170/70 | HR 77 | Temp 96.7°F | Resp 20

## 2013-05-20 DIAGNOSIS — E291 Testicular hypofunction: Secondary | ICD-10-CM

## 2013-05-20 DIAGNOSIS — E119 Type 2 diabetes mellitus without complications: Secondary | ICD-10-CM | POA: Diagnosis not present

## 2013-05-20 DIAGNOSIS — E349 Endocrine disorder, unspecified: Secondary | ICD-10-CM

## 2013-05-20 DIAGNOSIS — I10 Essential (primary) hypertension: Secondary | ICD-10-CM | POA: Diagnosis not present

## 2013-05-20 DIAGNOSIS — E039 Hypothyroidism, unspecified: Secondary | ICD-10-CM | POA: Diagnosis not present

## 2013-05-20 DIAGNOSIS — M109 Gout, unspecified: Secondary | ICD-10-CM | POA: Diagnosis not present

## 2013-05-20 MED ORDER — TESTOSTERONE CYPIONATE 200 MG/ML IM SOLN
300.0000 mg | INTRAMUSCULAR | Status: DC
  Administered 2013-05-20: 300 mg via INTRAMUSCULAR

## 2013-05-20 MED ORDER — TESTOSTERONE CYPIONATE 200 MG/ML IM SOLN
INTRAMUSCULAR | Status: AC
  Filled 2013-05-20: qty 2

## 2013-05-20 NOTE — Patient Instructions (Signed)
Testosterone injection What is this medicine? TESTOSTERONE (tes TOS ter one) is the main male hormone. It supports normal male development such as muscle growth, facial hair, and deep voice. It is used in males to treat low testosterone levels. This medicine may be used for other purposes; ask your health care provider or pharmacist if you have questions. What should I tell my health care provider before I take this medicine? They need to know if you have any of these conditions: -breast cancer -diabetes -heart disease -kidney disease -liver disease -lung disease -prostate cancer, enlargement -an unusual or allergic reaction to testosterone, other medicines, foods, dyes, or preservatives -pregnant or trying to get pregnant -breast-feeding How should I use this medicine? This medicine is for injection into a muscle. It is usually given by a health care professional in a hospital or clinic setting. Contact your pediatrician regarding the use of this medicine in children. While this medicine may be prescribed for children as young as 74 years of age for selected conditions, precautions do apply. Overdosage: If you think you have taken too much of this medicine contact a poison control center or emergency room at once. NOTE: This medicine is only for you. Do not share this medicine with others. What if I miss a dose? Try not to miss a dose. Your doctor or health care professional will tell you when your next injection is due. Notify the office if you are unable to keep an appointment. What may interact with this medicine? -medicines for diabetes -medicines that treat or prevent blood clots like warfarin -oxyphenbutazone -propranolol -steroid medicines like prednisone or cortisone This list may not describe all possible interactions. Give your health care provider a list of all the medicines, herbs, non-prescription drugs, or dietary supplements you use. Also tell them if you smoke, drink  alcohol, or use illegal drugs. Some items may interact with your medicine. What should I watch for while using this medicine? Visit your doctor or health care professional for regular checks on your progress. They will need to check the level of testosterone in your blood. This medicine may affect blood sugar levels. If you have diabetes, check with your doctor or health care professional before you change your diet or the dose of your diabetic medicine. This drug is banned from use in athletes by most athletic organizations. What side effects may I notice from receiving this medicine? Side effects that you should report to your doctor or health care professional as soon as possible: -allergic reactions like skin rash, itching or hives, swelling of the face, lips, or tongue -breast enlargement -breathing problems -changes in mood, especially anger, depression, or rage -dark urine -general ill feeling or flu-like symptoms -light-colored stools -loss of appetite, nausea -nausea, vomiting -right upper belly pain -stomach pain -swelling of ankles -too frequent or persistent erections -trouble passing urine or change in the amount of urine -unusually weak or tired -yellowing of the eyes or skin Additional side effects that can occur in women include: -deep or hoarse voice -facial hair growth -irregular menstrual periods Side effects that usually do not require medical attention (report to your doctor or health care professional if they continue or are bothersome): -acne -change in sex drive or performance -hair loss -headache This list may not describe all possible side effects. Call your doctor for medical advice about side effects. You may report side effects to FDA at 1-800-FDA-1088. Where should I keep my medicine? Keep out of the reach of children. This medicine  can be abused. Keep your medicine in a safe place to protect it from theft. Do not share this medicine with anyone.  Selling or giving away this medicine is dangerous and against the law. Store at room temperature between 20 and 25 degrees C (68 and 77 degrees F). Do not freeze. Protect from light. Follow the directions for the product you are prescribed. Throw away any unused medicine after the expiration date. NOTE: This sheet is a summary. It may not cover all possible information. If you have questions about this medicine, talk to your doctor, pharmacist, or health care provider.  2012, [employer]. (06/28/2007 4:13:46 PM)

## 2013-05-20 NOTE — Progress Notes (Signed)
Patient blood pressure high today 170/70 patient advice to see his primary M.D. Patient states he is on blood pressure medication and he will contact his medical doctor.

## 2013-05-27 ENCOUNTER — Ambulatory Visit: Payer: Medicare Other | Admitting: Hematology & Oncology

## 2013-05-27 ENCOUNTER — Ambulatory Visit: Payer: Medicare Other

## 2013-05-27 ENCOUNTER — Other Ambulatory Visit: Payer: Medicare Other | Admitting: Lab

## 2013-06-17 ENCOUNTER — Other Ambulatory Visit: Payer: Medicare Other | Admitting: Lab

## 2013-06-17 ENCOUNTER — Ambulatory Visit: Payer: Medicare Other

## 2013-06-20 ENCOUNTER — Ambulatory Visit: Payer: Medicare Other

## 2013-06-25 ENCOUNTER — Telehealth: Payer: Self-pay | Admitting: Hematology & Oncology

## 2013-06-25 NOTE — Telephone Encounter (Signed)
Patient called and cx 06/27/13 apt due to weather and stated he will keep 07/15/13 apt and see [place] in march instead of rescheduling

## 2013-06-27 ENCOUNTER — Ambulatory Visit: Payer: Medicare Other

## 2013-07-15 ENCOUNTER — Other Ambulatory Visit (HOSPITAL_BASED_OUTPATIENT_CLINIC_OR_DEPARTMENT_OTHER): Payer: Medicare Other | Admitting: Lab

## 2013-07-15 ENCOUNTER — Ambulatory Visit (HOSPITAL_BASED_OUTPATIENT_CLINIC_OR_DEPARTMENT_OTHER): Payer: Medicare Other | Admitting: Hematology & Oncology

## 2013-07-15 ENCOUNTER — Ambulatory Visit (HOSPITAL_BASED_OUTPATIENT_CLINIC_OR_DEPARTMENT_OTHER): Payer: Medicare Other

## 2013-07-15 ENCOUNTER — Telehealth: Payer: Self-pay | Admitting: Hematology & Oncology

## 2013-07-15 VITALS — BP 145/61 | HR 68 | Temp 97.7°F | Resp 20 | Wt 182.0 lb

## 2013-07-15 DIAGNOSIS — C8307 Small cell B-cell lymphoma, spleen: Secondary | ICD-10-CM

## 2013-07-15 DIAGNOSIS — N189 Chronic kidney disease, unspecified: Secondary | ICD-10-CM

## 2013-07-15 DIAGNOSIS — E291 Testicular hypofunction: Secondary | ICD-10-CM | POA: Diagnosis not present

## 2013-07-15 DIAGNOSIS — C9000 Multiple myeloma not having achieved remission: Secondary | ICD-10-CM | POA: Diagnosis not present

## 2013-07-15 DIAGNOSIS — Z9089 Acquired absence of other organs: Secondary | ICD-10-CM

## 2013-07-15 DIAGNOSIS — N179 Acute kidney failure, unspecified: Secondary | ICD-10-CM

## 2013-07-15 DIAGNOSIS — E349 Endocrine disorder, unspecified: Secondary | ICD-10-CM

## 2013-07-15 DIAGNOSIS — Z87898 Personal history of other specified conditions: Secondary | ICD-10-CM

## 2013-07-15 DIAGNOSIS — E119 Type 2 diabetes mellitus without complications: Secondary | ICD-10-CM

## 2013-07-15 LAB — IRON AND TIBC CHCC
%SAT: 40 % (ref 20–55)
IRON: 94 ug/dL (ref 42–163)
TIBC: 236 ug/dL (ref 202–409)
UIBC: 142 ug/dL (ref 117–376)

## 2013-07-15 LAB — FERRITIN CHCC: Ferritin: 152 ng/ml (ref 22–316)

## 2013-07-15 LAB — COMPREHENSIVE METABOLIC PANEL
ALK PHOS: 85 U/L (ref 39–117)
ALT: 12 U/L (ref 0–53)
AST: 17 U/L (ref 0–37)
Albumin: 4.1 g/dL (ref 3.5–5.2)
BILIRUBIN TOTAL: 0.5 mg/dL (ref 0.2–1.2)
BUN: 40 mg/dL — ABNORMAL HIGH (ref 6–23)
CO2: 24 mEq/L (ref 19–32)
Calcium: 9.3 mg/dL (ref 8.4–10.5)
Chloride: 105 mEq/L (ref 96–112)
Creatinine, Ser: 1.75 mg/dL — ABNORMAL HIGH (ref 0.50–1.35)
Glucose, Bld: 202 mg/dL — ABNORMAL HIGH (ref 70–99)
Potassium: 4.3 mEq/L (ref 3.5–5.3)
SODIUM: 139 meq/L (ref 135–145)
TOTAL PROTEIN: 6.8 g/dL (ref 6.0–8.3)

## 2013-07-15 LAB — CBC WITH DIFFERENTIAL (CANCER CENTER ONLY)
BASO#: 0 10*3/uL (ref 0.0–0.2)
BASO%: 0.6 % (ref 0.0–2.0)
EOS ABS: 0.4 10*3/uL (ref 0.0–0.5)
EOS%: 5.5 % (ref 0.0–7.0)
HEMATOCRIT: 34.2 % — AB (ref 38.7–49.9)
HEMOGLOBIN: 11.6 g/dL — AB (ref 13.0–17.1)
LYMPH#: 2.3 10*3/uL (ref 0.9–3.3)
LYMPH%: 34 % (ref 14.0–48.0)
MCH: 33.4 pg (ref 28.0–33.4)
MCHC: 33.9 g/dL (ref 32.0–35.9)
MCV: 99 fL — ABNORMAL HIGH (ref 82–98)
MONO#: 1.1 10*3/uL — ABNORMAL HIGH (ref 0.1–0.9)
MONO%: 15.4 % — ABNORMAL HIGH (ref 0.0–13.0)
NEUT%: 44.5 % (ref 40.0–80.0)
NEUTROS ABS: 3.1 10*3/uL (ref 1.5–6.5)
Platelets: 229 10*3/uL (ref 145–400)
RBC: 3.47 10*6/uL — AB (ref 4.20–5.70)
RDW: 13 % (ref 11.1–15.7)
WBC: 6.9 10*3/uL (ref 4.0–10.0)

## 2013-07-15 LAB — TESTOSTERONE: Testosterone: 305 ng/dL (ref 300–890)

## 2013-07-15 MED ORDER — FEBUXOSTAT 40 MG PO TABS: 40.0000 mg | ORAL_TABLET | Freq: Every day | ORAL | Status: DC

## 2013-07-15 MED ORDER — TESTOSTERONE CYPIONATE 200 MG/ML IM SOLN
400.0000 mg | INTRAMUSCULAR | Status: DC
  Administered 2013-07-15: 400 mg via INTRAMUSCULAR

## 2013-07-15 MED ORDER — TESTOSTERONE CYPIONATE 200 MG/ML IM SOLN
INTRAMUSCULAR | Status: AC
  Filled 2013-07-15: qty 2

## 2013-07-15 NOTE — Telephone Encounter (Signed)
Pt started inj at 5 weeks due to vacation

## 2013-07-15 NOTE — Patient Instructions (Signed)
Testosterone injection What is this medicine? TESTOSTERONE (tes TOS ter one) is the main male hormone. It supports normal male development such as muscle growth, facial hair, and deep voice. It is used in males to treat low testosterone levels. This medicine may be used for other purposes; ask your health care provider or pharmacist if you have questions. COMMON BRAND NAME(S): Andro-L.A., Aveed, Delatestryl, Depo-Testosterone, Virilon What should I tell my health care provider before I take this medicine? They need to know if you have any of these conditions: -breast cancer -diabetes -heart disease -kidney disease -liver disease -lung disease -prostate cancer, enlargement -an unusual or allergic reaction to testosterone, other medicines, foods, dyes, or preservatives -pregnant or trying to get pregnant -breast-feeding How should I use this medicine? This medicine is for injection into a muscle. It is usually given by a health care professional in a hospital or clinic setting. Contact your pediatrician regarding the use of this medicine in children. While this medicine may be prescribed for children as young as 32 years of age for selected conditions, precautions do apply. Overdosage: If you think you have taken too much of this medicine contact a poison control center or emergency room at once. NOTE: This medicine is only for you. Do not share this medicine with others. What if I miss a dose? Try not to miss a dose. Your doctor or health care professional will tell you when your next injection is due. Notify the office if you are unable to keep an appointment. What may interact with this medicine? -medicines for diabetes -medicines that treat or prevent blood clots like warfarin -oxyphenbutazone -propranolol -steroid medicines like prednisone or cortisone This list may not describe all possible interactions. Give your health care provider a list of all the medicines, herbs,  non-prescription drugs, or dietary supplements you use. Also tell them if you smoke, drink alcohol, or use illegal drugs. Some items may interact with your medicine. What should I watch for while using this medicine? Visit your doctor or health care professional for regular checks on your progress. They will need to check the level of testosterone in your blood. This medicine may affect blood sugar levels. If you have diabetes, check with your doctor or health care professional before you change your diet or the dose of your diabetic medicine. This drug is banned from use in athletes by most athletic organizations. What side effects may I notice from receiving this medicine? Side effects that you should report to your doctor or health care professional as soon as possible: -allergic reactions like skin rash, itching or hives, swelling of the face, lips, or tongue -breast enlargement -breathing problems -changes in mood, especially anger, depression, or rage -dark urine -general ill feeling or flu-like symptoms -light-colored stools -loss of appetite, nausea -nausea, vomiting -right upper belly pain -stomach pain -swelling of ankles -too frequent or persistent erections -trouble passing urine or change in the amount of urine -unusually weak or tired -yellowing of the eyes or skin Additional side effects that can occur in women include: -deep or hoarse voice -facial hair growth -irregular menstrual periods Side effects that usually do not require medical attention (report to your doctor or health care professional if they continue or are bothersome): -acne -change in sex drive or performance -hair loss -headache This list may not describe all possible side effects. Call your doctor for medical advice about side effects. You may report side effects to FDA at 1-800-FDA-1088. Where should I keep my medicine? Keep  out of the reach of children. This medicine can be abused. Keep your  medicine in a safe place to protect it from theft. Do not share this medicine with anyone. Selling or giving away this medicine is dangerous and against the law. Store at room temperature between 20 and 25 degrees C (68 and 77 degrees F). Do not freeze. Protect from light. Follow the directions for the product you are prescribed. Throw away any unused medicine after the expiration date. NOTE: This sheet is a summary. It may not cover all possible information. If you have questions about this medicine, talk to your doctor, pharmacist, or health care provider.  2014, [employer]. (2007-06-28 16:13:46)

## 2013-07-17 NOTE — Progress Notes (Signed)
   DIAGNOSES: 1. 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 3. Chronic renal insufficiency.  CURRENT THERAPY:  Testosterone 400 mg IM q.4 months.  INTERIM HISTORY:  Ms. Larry [name] comes in for a followup.  He is a doing well. He has missed some appointments because of bad weather. As such, his missed his testosterone shots.    He has had no further decline his kidney function.  The testosterone does seem to help him.  His wife, however, says that it does not last all that long.  When his testosterone level gets low, he does tend to get somewhat irritable.  When we last saw him in December, his testosterone level was 264.  He has had no cough or shortness of breath.  He has had no fever, sweats, or chills.  He has had no nausea or vomiting.  There has been no issues with rashes.  He has had no change in his medications.  PHYSICAL EXAMINATION:  General:  This is a well-developed, well- nourished white gentleman in no obvious distress.  Vital Signs: Temperature of 97. 7, pulse 68, respiratory rate 18, blood pressure 145/61, weight is 182 pounds.  Head and Neck:  Normocephalic, atraumatic skull.  There are no ocular or oral lesions.  There are no palpable cervical or supraclavicular lymph nodes.  Lungs:  Clear bilaterally. Cardiac:  Regular rate and rhythm with normal S1 and S2.  There are no murmurs, rubs, or bruits.  Abdomen:  Soft.  He has well-healed splenectomy scar.  There is no fluid wave.  There is no palpable hepatosplenomegaly.  Back:  No tenderness over the spine, ribs, or hips. Extremities:  No clubbing, cyanosis, or edema.  Neurologic:  No focal neurological deficits.  LABORATORY STUDIES:  White cell count is 6.9, hemoglobin 11.6 hematocrit 34.2, platelet count 229.  LDH is 172.  BUN 40 creatinine 1.75.  Testosterone is 303.  Ferritin is 152. R. saturation 40%.  IMPRESSION:  Larry [name] is a very nice 73 year old gentleman.   He has history of splenic marginal zone lymphoma.  He had a splenectomy back in 2009.  He then did get 1 cycle of Rituxan in April 2010.  So far, I seen no evidence of recurrent lymphoma.  He has renal insufficiency.  He sees [facility] for this.  We will monitor his testosterone.  We will increase his testosterone replacement therapy to 400 mg.  I will plan to see him back myself in another 3 months.  He comes back monthly for his testosterone injections.

## 2013-08-08 DIAGNOSIS — N183 Chronic kidney disease, stage 3 unspecified: Secondary | ICD-10-CM | POA: Diagnosis not present

## 2013-08-12 ENCOUNTER — Ambulatory Visit: Payer: Medicare Other

## 2013-08-15 ENCOUNTER — Telehealth: Payer: Self-pay | Admitting: Hematology & Oncology

## 2013-08-15 NOTE — Telephone Encounter (Signed)
Pt moved 4-21 to 4-24

## 2013-08-19 ENCOUNTER — Ambulatory Visit: Payer: Medicare Other

## 2013-08-22 ENCOUNTER — Ambulatory Visit (HOSPITAL_BASED_OUTPATIENT_CLINIC_OR_DEPARTMENT_OTHER): Payer: Medicare Other

## 2013-08-22 VITALS — BP 177/68 | HR 79 | Temp 96.9°F | Resp 16 | Wt 183.0 lb

## 2013-08-22 DIAGNOSIS — E291 Testicular hypofunction: Secondary | ICD-10-CM | POA: Diagnosis not present

## 2013-08-22 DIAGNOSIS — E349 Endocrine disorder, unspecified: Secondary | ICD-10-CM

## 2013-08-22 MED ORDER — TESTOSTERONE CYPIONATE 200 MG/ML IM SOLN
INTRAMUSCULAR | Status: AC
  Filled 2013-08-22: qty 1

## 2013-08-22 MED ORDER — TESTOSTERONE CYPIONATE 200 MG/ML IM SOLN
400.0000 mg | INTRAMUSCULAR | Status: DC
  Administered 2013-08-22: 400 mg via INTRAMUSCULAR

## 2013-08-22 NOTE — Patient Instructions (Signed)
Testosterone This test is used to determine if your testosterone level is abnormal. This could be used to explain difficulty getting an erection (erectile dysfunction), inability of your partner to get pregnant (infertility), premature or delayed puberty if you are male, or the appearance of masculine physical features if you are male. PREPARATION FOR TEST A blood sample is obtained by inserting a needle into a vein in the arm. NORMAL FINDINGS  Free Testosterone: 0.3-2 pg/mL  % Free Testosterone: 0.1%-0.3% Total Testosterone:  7 mos-9 yrs (Tanner Stage I)  Male: Less than 30 ng/dL  Male: Less than 30 ng/dL  10-13 yrs (Tanner Stage II)  Male: Less than 300 ng/dL  Male: Less than 40 ng/dL  14-15 yrs (Tanner Stage III)  Male: 170-540 ng/dL  Male: Less than 60 ng/dL  16-19 yrs (Tanner Stage IV, V)  Male: 250-910 ng/dL  Male: Less than 70 ng/dL  20 yrs and over  Male: 280-1080 ng/dL  Male: Less than 70 ng/dL Ranges for normal findings may vary among different laboratories and hospitals. You should always check with your doctor after having lab work or other tests done to discuss the meaning of your test results and whether your values are considered within normal limits. MEANING OF TEST  Your caregiver will go over the test results with you and discuss the importance and meaning of your results, as well as treatment options and the need for additional tests if necessary. OBTAINING THE TEST RESULTS It is your responsibility to obtain your test results. Ask the lab or department performing the test when and how you will get your results. Document Released: 05/04/2004 Document Revised: 07/10/2011 Document Reviewed: 03/29/2008 Exit[employer] Patient Information 2014 [employer].  

## 2013-09-09 ENCOUNTER — Ambulatory Visit: Payer: Medicare Other

## 2013-09-16 ENCOUNTER — Ambulatory Visit: Payer: Medicare Other

## 2013-09-17 ENCOUNTER — Ambulatory Visit (HOSPITAL_BASED_OUTPATIENT_CLINIC_OR_DEPARTMENT_OTHER): Payer: Medicare Other

## 2013-09-17 VITALS — BP 155/60 | HR 65 | Temp 97.1°F | Resp 18

## 2013-09-17 DIAGNOSIS — E291 Testicular hypofunction: Secondary | ICD-10-CM | POA: Diagnosis not present

## 2013-09-17 DIAGNOSIS — E349 Endocrine disorder, unspecified: Secondary | ICD-10-CM

## 2013-09-17 MED ORDER — TESTOSTERONE CYPIONATE 200 MG/ML IM SOLN
400.0000 mg | INTRAMUSCULAR | Status: DC
  Administered 2013-09-17: 400 mg via INTRAMUSCULAR

## 2013-09-17 MED ORDER — TESTOSTERONE CYPIONATE 200 MG/ML IM SOLN
INTRAMUSCULAR | Status: AC
  Filled 2013-09-17: qty 2

## 2013-09-17 NOTE — Patient Instructions (Signed)
Testosterone injection What is this medicine? TESTOSTERONE (tes TOS ter one) is the main male hormone. It supports normal male development such as muscle growth, facial hair, and deep voice. It is used in males to treat low testosterone levels. This medicine may be used for other purposes; ask your health care provider or pharmacist if you have questions. What should I tell my health care provider before I take this medicine? They need to know if you have any of these conditions: -breast cancer -diabetes -heart disease -kidney disease -liver disease -lung disease -prostate cancer, enlargement -an unusual or allergic reaction to testosterone, other medicines, foods, dyes, or preservatives -pregnant or trying to get pregnant -breast-feeding How should I use this medicine? This medicine is for injection into a muscle. It is usually given by a health care professional in a hospital or clinic setting. Contact your pediatrician regarding the use of this medicine in children. While this medicine may be prescribed for children as young as 49 years of age for selected conditions, precautions do apply. Overdosage: If you think you have taken too much of this medicine contact a poison control center or emergency room at once. NOTE: This medicine is only for you. Do not share this medicine with others. What if I miss a dose? Try not to miss a dose. Your doctor or health care professional will tell you when your next injection is due. Notify the office if you are unable to keep an appointment. What may interact with this medicine? -medicines for diabetes -medicines that treat or prevent blood clots like warfarin -oxyphenbutazone -propranolol -steroid medicines like prednisone or cortisone This list may not describe all possible interactions. Give your health care provider a list of all the medicines, herbs, non-prescription drugs, or dietary supplements you use. Also tell them if you smoke, drink  alcohol, or use illegal drugs. Some items may interact with your medicine. What should I watch for while using this medicine? Visit your doctor or health care professional for regular checks on your progress. They will need to check the level of testosterone in your blood. This medicine may affect blood sugar levels. If you have diabetes, check with your doctor or health care professional before you change your diet or the dose of your diabetic medicine. This drug is banned from use in athletes by most athletic organizations. What side effects may I notice from receiving this medicine? Side effects that you should report to your doctor or health care professional as soon as possible: -allergic reactions like skin rash, itching or hives, swelling of the face, lips, or tongue -breast enlargement -breathing problems -changes in mood, especially anger, depression, or rage -dark urine -general ill feeling or flu-like symptoms -light-colored stools -loss of appetite, nausea -nausea, vomiting -right upper belly pain -stomach pain -swelling of ankles -too frequent or persistent erections -trouble passing urine or change in the amount of urine -unusually weak or tired -yellowing of the eyes or skin Additional side effects that can occur in women include: -deep or hoarse voice -facial hair growth -irregular menstrual periods Side effects that usually do not require medical attention (report to your doctor or health care professional if they continue or are bothersome): -acne -change in sex drive or performance -hair loss -headache This list may not describe all possible side effects. Call your doctor for medical advice about side effects. You may report side effects to FDA at 1-800-FDA-1088. Where should I keep my medicine? Keep out of the reach of children. This medicine  can be abused. Keep your medicine in a safe place to protect it from theft. Do not share this medicine with anyone.  Selling or giving away this medicine is dangerous and against the law. Store at room temperature between 20 and 25 degrees C (68 and 77 degrees F). Do not freeze. Protect from light. Follow the directions for the product you are prescribed. Throw away any unused medicine after the expiration date. NOTE: This sheet is a summary. It may not cover all possible information. If you have questions about this medicine, talk to your doctor, pharmacist, or health care provider.  2012, [employer]. (06/28/2007 4:13:46 PM)

## 2013-10-07 ENCOUNTER — Ambulatory Visit: Payer: Medicare Other

## 2013-10-14 ENCOUNTER — Ambulatory Visit (HOSPITAL_BASED_OUTPATIENT_CLINIC_OR_DEPARTMENT_OTHER): Payer: Medicare Other

## 2013-10-14 VITALS — BP 138/69 | HR 73 | Temp 97.3°F | Resp 16

## 2013-10-14 DIAGNOSIS — E291 Testicular hypofunction: Secondary | ICD-10-CM

## 2013-10-14 DIAGNOSIS — E349 Endocrine disorder, unspecified: Secondary | ICD-10-CM

## 2013-10-14 MED ORDER — TESTOSTERONE CYPIONATE 200 MG/ML IM SOLN
400.0000 mg | INTRAMUSCULAR | Status: DC
  Administered 2013-10-14: 400 mg via INTRAMUSCULAR

## 2013-10-14 MED ORDER — TESTOSTERONE CYPIONATE 200 MG/ML IM SOLN
INTRAMUSCULAR | Status: AC
  Filled 2013-10-14: qty 2

## 2013-10-14 NOTE — Patient Instructions (Signed)
Testosterone injection What is this medicine? TESTOSTERONE (tes TOS ter one) is the main male hormone. It supports normal male development such as muscle growth, facial hair, and deep voice. It is used in males to treat low testosterone levels. This medicine may be used for other purposes; ask your health care Larry [name] or pharmacist if you have questions. COMMON BRAND NAME(S): Andro-L.A., Aveed, Delatestryl, Depo-Testosterone, Virilon What should I tell my health care Larry [name] before I take this medicine? They need to know if you have any of these conditions: -breast cancer -diabetes -heart disease -kidney disease -liver disease -lung disease -prostate cancer, enlargement -an unusual or allergic reaction to testosterone, other medicines, foods, dyes, or preservatives -pregnant or trying to get pregnant -breast-feeding How should I use this medicine? This medicine is for injection into a muscle. It is usually given by a health care professional in a hospital or clinic setting. Contact your pediatrician regarding the use of this medicine in children. While this medicine may be prescribed for children as young as 32 years of age for selected conditions, precautions do apply. Overdosage: If you think you have taken too much of this medicine contact a poison control center or emergency room at once. NOTE: This medicine is only for you. Do not share this medicine with others. What if I miss a dose? Try not to miss a dose. Your doctor or health care professional will tell you when your next injection is due. Notify the office if you are unable to keep an appointment. What may interact with this medicine? -medicines for diabetes -medicines that treat or prevent blood clots like warfarin -oxyphenbutazone -propranolol -steroid medicines like prednisone or cortisone This list may not describe all possible interactions. Give your health care Larry [name] a list of all the medicines, herbs,  non-prescription drugs, or dietary supplements you use. Also tell them if you smoke, drink alcohol, or use illegal drugs. Some items may interact with your medicine. What should I watch for while using this medicine? Visit your doctor or health care professional for regular checks on your progress. They will need to check the level of testosterone in your blood. This medicine may affect blood sugar levels. If you have diabetes, check with your doctor or health care professional before you change your diet or the dose of your diabetic medicine. This drug is banned from use in athletes by most athletic organizations. What side effects may I notice from receiving this medicine? Side effects that you should report to your doctor or health care professional as soon as possible: -allergic reactions like skin rash, itching or hives, swelling of the face, lips, or tongue -breast enlargement -breathing problems -changes in mood, especially anger, depression, or rage -dark urine -general ill feeling or flu-like symptoms -light-colored stools -loss of appetite, nausea -nausea, vomiting -right upper belly pain -stomach pain -swelling of ankles -too frequent or persistent erections -trouble passing urine or change in the amount of urine -unusually weak or tired -yellowing of the eyes or skin Additional side effects that can occur in women include: -deep or hoarse voice -facial hair growth -irregular menstrual periods Side effects that usually do not require medical attention (report to your doctor or health care professional if they continue or are bothersome): -acne -change in sex drive or performance -hair loss -headache This list may not describe all possible side effects. Call your doctor for medical advice about side effects. You may report side effects to FDA at 1-800-FDA-1088. Where should I keep my medicine? Keep  out of the reach of children. This medicine can be abused. Keep your  medicine in a safe place to protect it from theft. Do not share this medicine with anyone. Selling or giving away this medicine is dangerous and against the law. Store at room temperature between 20 and 25 degrees C (68 and 77 degrees F). Do not freeze. Protect from light. Follow the directions for the product you are prescribed. Throw away any unused medicine after the expiration date. NOTE: This sheet is a summary. It may not cover all possible information. If you have questions about this medicine, talk to your doctor, pharmacist, or health care Larry [name].  2014, [employer]. (2007-06-28 16:13:46)

## 2013-11-04 ENCOUNTER — Other Ambulatory Visit: Payer: Medicare Other | Admitting: Lab

## 2013-11-04 ENCOUNTER — Ambulatory Visit: Payer: Medicare Other

## 2013-11-04 ENCOUNTER — Ambulatory Visit: Payer: Medicare Other | Admitting: Hematology & Oncology

## 2013-11-13 ENCOUNTER — Other Ambulatory Visit (HOSPITAL_BASED_OUTPATIENT_CLINIC_OR_DEPARTMENT_OTHER): Payer: Medicare Other | Admitting: Lab

## 2013-11-13 ENCOUNTER — Ambulatory Visit (HOSPITAL_BASED_OUTPATIENT_CLINIC_OR_DEPARTMENT_OTHER): Payer: Medicare Other

## 2013-11-13 ENCOUNTER — Encounter: Payer: Self-pay | Admitting: Family

## 2013-11-13 ENCOUNTER — Ambulatory Visit (HOSPITAL_BASED_OUTPATIENT_CLINIC_OR_DEPARTMENT_OTHER): Payer: Medicare Other | Admitting: Family

## 2013-11-13 VITALS — BP 143/60 | HR 67 | Temp 97.6°F | Resp 18 | Ht 68.0 in | Wt 178.0 lb

## 2013-11-13 DIAGNOSIS — E119 Type 2 diabetes mellitus without complications: Secondary | ICD-10-CM | POA: Diagnosis not present

## 2013-11-13 DIAGNOSIS — C8307 Small cell B-cell lymphoma, spleen: Secondary | ICD-10-CM | POA: Diagnosis not present

## 2013-11-13 DIAGNOSIS — E291 Testicular hypofunction: Secondary | ICD-10-CM

## 2013-11-13 DIAGNOSIS — D509 Iron deficiency anemia, unspecified: Secondary | ICD-10-CM | POA: Diagnosis not present

## 2013-11-13 DIAGNOSIS — M10441 Other secondary gout, right hand: Secondary | ICD-10-CM

## 2013-11-13 DIAGNOSIS — Z87898 Personal history of other specified conditions: Secondary | ICD-10-CM

## 2013-11-13 DIAGNOSIS — E349 Endocrine disorder, unspecified: Secondary | ICD-10-CM

## 2013-11-13 DIAGNOSIS — I10 Essential (primary) hypertension: Secondary | ICD-10-CM | POA: Diagnosis not present

## 2013-11-13 DIAGNOSIS — M109 Gout, unspecified: Secondary | ICD-10-CM | POA: Diagnosis not present

## 2013-11-13 LAB — CBC WITH DIFFERENTIAL (CANCER CENTER ONLY)
BASO#: 0 10*3/uL (ref 0.0–0.2)
BASO%: 0.6 % (ref 0.0–2.0)
EOS%: 6.5 % (ref 0.0–7.0)
Eosinophils Absolute: 0.5 10*3/uL (ref 0.0–0.5)
HCT: 33.4 % — ABNORMAL LOW (ref 38.7–49.9)
HGB: 11.7 g/dL — ABNORMAL LOW (ref 13.0–17.1)
LYMPH#: 1.3 10*3/uL (ref 0.9–3.3)
LYMPH%: 18.3 % (ref 14.0–48.0)
MCH: 34.1 pg — ABNORMAL HIGH (ref 28.0–33.4)
MCHC: 35 g/dL (ref 32.0–35.9)
MCV: 97 fL (ref 82–98)
MONO#: 1.2 10*3/uL — ABNORMAL HIGH (ref 0.1–0.9)
MONO%: 17 % — ABNORMAL HIGH (ref 0.0–13.0)
NEUT%: 57.6 % (ref 40.0–80.0)
NEUTROS ABS: 4.1 10*3/uL (ref 1.5–6.5)
PLATELETS: 317 10*3/uL (ref 145–400)
RBC: 3.43 10*6/uL — AB (ref 4.20–5.70)
RDW: 13 % (ref 11.1–15.7)
WBC: 7.1 10*3/uL (ref 4.0–10.0)

## 2013-11-13 LAB — IRON AND TIBC CHCC
%SAT: 24 % (ref 20–55)
IRON: 51 ug/dL (ref 42–163)
TIBC: 214 ug/dL (ref 202–409)
UIBC: 163 ug/dL (ref 117–376)

## 2013-11-13 LAB — CMP (CANCER CENTER ONLY)
ALT(SGPT): 10 U/L (ref 10–47)
AST: 23 U/L (ref 11–38)
Albumin: 3.5 g/dL (ref 3.3–5.5)
Alkaline Phosphatase: 76 U/L (ref 26–84)
BUN: 34 mg/dL — AB (ref 7–22)
CALCIUM: 8.9 mg/dL (ref 8.0–10.3)
CHLORIDE: 101 meq/L (ref 98–108)
CO2: 26 meq/L (ref 18–33)
CREATININE: 1.8 mg/dL — AB (ref 0.6–1.2)
Glucose, Bld: 144 mg/dL — ABNORMAL HIGH (ref 73–118)
Potassium: 3.9 mEq/L (ref 3.3–4.7)
Sodium: 141 mEq/L (ref 128–145)
Total Bilirubin: 0.6 mg/dl (ref 0.20–1.60)
Total Protein: 7.5 g/dL (ref 6.4–8.1)

## 2013-11-13 LAB — TESTOSTERONE: TESTOSTERONE: 166 ng/dL — AB (ref 300–890)

## 2013-11-13 LAB — FERRITIN CHCC: Ferritin: 162 ng/ml (ref 22–316)

## 2013-11-13 LAB — URIC ACID: URIC ACID, SERUM: 8.3 mg/dL — AB (ref 4.0–7.8)

## 2013-11-13 MED ORDER — TESTOSTERONE CYPIONATE 200 MG/ML IM SOLN
INTRAMUSCULAR | Status: AC
  Filled 2013-11-13: qty 2

## 2013-11-13 MED ORDER — TESTOSTERONE CYPIONATE 200 MG/ML IM SOLN
400.0000 mg | INTRAMUSCULAR | Status: DC
  Administered 2013-11-13: 400 mg via INTRAMUSCULAR

## 2013-11-13 NOTE — Patient Instructions (Signed)
Testosterone injection What is this medicine? TESTOSTERONE (tes TOS ter one) is the main male hormone. It supports normal male development such as muscle growth, facial hair, and deep voice. It is used in males to treat low testosterone levels. This medicine may be used for other purposes; ask your health care provider or pharmacist if you have questions. COMMON BRAND NAME(S): Andro-L.A., Aveed, Delatestryl, Depo-Testosterone, Virilon What should I tell my health care provider before I take this medicine? They need to know if you have any of these conditions: -breast cancer -diabetes -heart disease -kidney disease -liver disease -lung disease -prostate cancer, enlargement -an unusual or allergic reaction to testosterone, other medicines, foods, dyes, or preservatives -pregnant or trying to get pregnant -breast-feeding How should I use this medicine? This medicine is for injection into a muscle. It is usually given by a health care professional in a hospital or clinic setting. Contact your pediatrician regarding the use of this medicine in children. While this medicine may be prescribed for children as young as 32 years of age for selected conditions, precautions do apply. Overdosage: If you think you have taken too much of this medicine contact a poison control center or emergency room at once. NOTE: This medicine is only for you. Do not share this medicine with others. What if I miss a dose? Try not to miss a dose. Your doctor or health care professional will tell you when your next injection is due. Notify the office if you are unable to keep an appointment. What may interact with this medicine? -medicines for diabetes -medicines that treat or prevent blood clots like warfarin -oxyphenbutazone -propranolol -steroid medicines like prednisone or cortisone This list may not describe all possible interactions. Give your health care provider a list of all the medicines, herbs,  non-prescription drugs, or dietary supplements you use. Also tell them if you smoke, drink alcohol, or use illegal drugs. Some items may interact with your medicine. What should I watch for while using this medicine? Visit your doctor or health care professional for regular checks on your progress. They will need to check the level of testosterone in your blood. This medicine may affect blood sugar levels. If you have diabetes, check with your doctor or health care professional before you change your diet or the dose of your diabetic medicine. This drug is banned from use in athletes by most athletic organizations. What side effects may I notice from receiving this medicine? Side effects that you should report to your doctor or health care professional as soon as possible: -allergic reactions like skin rash, itching or hives, swelling of the face, lips, or tongue -breast enlargement -breathing problems -changes in mood, especially anger, depression, or rage -dark urine -general ill feeling or flu-like symptoms -light-colored stools -loss of appetite, nausea -nausea, vomiting -right upper belly pain -stomach pain -swelling of ankles -too frequent or persistent erections -trouble passing urine or change in the amount of urine -unusually weak or tired -yellowing of the eyes or skin Additional side effects that can occur in women include: -deep or hoarse voice -facial hair growth -irregular menstrual periods Side effects that usually do not require medical attention (report to your doctor or health care professional if they continue or are bothersome): -acne -change in sex drive or performance -hair loss -headache This list may not describe all possible side effects. Call your doctor for medical advice about side effects. You may report side effects to FDA at 1-800-FDA-1088. Where should I keep my medicine? Keep  out of the reach of children. This medicine can be abused. Keep your  medicine in a safe place to protect it from theft. Do not share this medicine with anyone. Selling or giving away this medicine is dangerous and against the law. Store at room temperature between 20 and 25 degrees C (68 and 77 degrees F). Do not freeze. Protect from light. Follow the directions for the product you are prescribed. Throw away any unused medicine after the expiration date. NOTE: This sheet is a summary. It may not cover all possible information. If you have questions about this medicine, talk to your doctor, pharmacist, or health care provider.  2015, [employer]vier/[employer] Standard. (2007-06-28 16:13:46)

## 2013-11-13 NOTE — Progress Notes (Signed)
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  2. Hypotestosteronemia.  3. Chronic renal insufficiency.  INTERVAL HISTORY: Larry [name] is a very pleasant 73 yo male here today with his wife for a follow-up. He is a doing very well. He continues to get Testosterone injections monthly. This is working well for him. His last testosterone level was 305 in March. He states that his energy level is great. He has a good appetite. He denies headaches, dizziness, blurred vision, fever, chills, n/v, rash, couch, SOB, chest pain, palpitations, abdominal pain, constipation, diarrhea, problems urinating, blood in urine or stool. He does states that he is having a gout flare up in his left wrist and hand. We added a uric acid level to his labs and will fax the results to Larry [name] as the patient is planning to see him tomorrow. He has had no change in his medications.  CURRENT THERAPY: Testosterone 400 mg IM once a month.  REVIEW OF SYSTEMS: All other 10 point review of systems is negative except for those issues mentioned above.   PAST MEDICAL HISTORY: Past Medical History  Diagnosis Date  . Cancer     Non Hodgkins Lymphoma  . Hernia 1949  . Diabetes mellitus 5-6 yrs    T2DM  . Hypertension   . Hyperkalemia   . 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 Hypotestosteronism 03/29/2011  . Thyroid disease    PAST SURGICAL HISTORY: Past Surgical History  Procedure Laterality Date  . Splenectomy, total  2009  . Appendectomy  1948  . Pancreatectomy  2009    Partial w/ splenectomy  . Tonsillectomy    . Hernia repair  1949    unknown what type of hernia  . Laparotomy  08/11/2011    Procedure: EXPLORATORY  LAPAROTOMY;  Surgeon: Larry Jarred, MD;  Location: [facility];  Service: General;  Laterality: N/A;   FAMILY HISTORY No family history on file.  GYNECOLOGIC HISTORY:  No LMP for male patient.   SOCIAL HISTORY:  ADVANCED DIRECTIVES:   HEALTH MAINTENANCE: History  Substance Use Topics  . Smoking status: Never Smoker   . Smokeless tobacco: Never Used     Comment: Never used tobacco  . Alcohol Use: No   Colonoscopy: PAP: Bone density: Lipid panel:  Allergies  Allergen Reactions  . Iohexol     Pt states has history of reaction in the past, can not remember what exactly happened, but was told by the dr never to have the contrast again.   Current Outpatient Prescriptions  Medication Sig Dispense Refill  . amLODipine (NORVASC) 10 MG tablet Take 10 mg by mouth daily.        Marland Kitchen doxazosin (CARDURA) 2 MG tablet Take 2 mg by mouth at bedtime. TAKE 3 TABS AT BEDTIME      . febuxostat (ULORIC) 40 MG tablet Take 1 tablet (40 mg total) by mouth daily.  30 tablet  6  . furosemide (LASIX) 40 MG tablet Take 40 mg by mouth daily.       Marland Kitchen levothyroxine (SYNTHROID, LEVOTHROID) 150 MCG tablet Take 150 mcg by mouth daily.        Marland Kitchen omeprazole (PRILOSEC) 20 MG capsule 20 mg 2 (two) times daily.       Marland Kitchen  sitaGLIPtin (JANUVIA) 50 MG tablet Take 50 mg by mouth daily.      Marland Kitchen triamcinolone cream (KENALOG) 0.1 % as needed.        No current facility-administered medications for this visit.   Facility-Administered Medications Ordered in Other Visits  Medication Dose Route Frequency Provider Last Rate Last Dose  . testosterone cypionate (DEPOTESTOTERONE CYPIONATE) injection 400 mg  400 mg Intramuscular Q28 days Larry Napoleon, MD       OBJECTIVE: Filed Vitals:   11/13/13 1000  BP: 143/60  Pulse: 67  Temp: 97.6 F (36.4 C)  Resp: 18   Body mass index is 27.07 kg/(m^2). ECOG FS:0 - Asymptomatic Ocular: Sclerae unicteric, pupils equal, round and reactive to light Ear-nose-throat: Oropharynx clear,  dentition fair Lymphatic: No cervical or supraclavicular adenopathy Lungs no rales or rhonchi, good excursion bilaterally Heart regular rate and rhythm, no murmur appreciated Abd soft, nontender, positive bowel sounds MSK no focal spinal tenderness, no joint edema Neuro: non-focal, well-oriented, appropriate affect  LAB RESULTS: CMP     Component Value Date/Time   NA 141 11/13/2013 0933   NA 139 07/15/2013 0913   K 3.9 11/13/2013 0933   K 4.3 07/15/2013 0913   CL 101 11/13/2013 0933   CL 105 07/15/2013 0913   CO2 26 11/13/2013 0933   CO2 24 07/15/2013 0913   GLUCOSE 144* 11/13/2013 0933   GLUCOSE 202* 07/15/2013 0913   BUN 34* 11/13/2013 0933   BUN 40* 07/15/2013 0913   CREATININE 1.8* 11/13/2013 0933   CREATININE 1.75* 07/15/2013 0913   CALCIUM 8.9 11/13/2013 0933   CALCIUM 9.3 07/15/2013 0913   PROT 7.5 11/13/2013 0933   PROT 6.8 07/15/2013 0913   ALBUMIN 4.1 07/15/2013 0913   AST 23 11/13/2013 0933   AST 17 07/15/2013 0913   ALT 10 11/13/2013 0933   ALT 12 07/15/2013 0913   ALKPHOS 76 11/13/2013 0933   ALKPHOS 85 07/15/2013 0913   BILITOT 0.60 11/13/2013 0933   BILITOT 0.5 07/15/2013 0913   GFRNONAA 41* 08/19/2011 0830   GFRAA 48* 08/19/2011 0830   No results found for this basename: SPEP, UPEP,  kappa and lambda light chains   Lab Results  Component Value Date   WBC 7.1 11/13/2013   NEUTROABS 4.1 11/13/2013   HGB 11.7* 11/13/2013   HCT 33.4* 11/13/2013   MCV 97 11/13/2013   PLT 317 11/13/2013   No results found for this basename: LABCA2   No components found with this basename: [id number]   No results found for this basename: INR,  in the last 168 hours  STUDIES: No results found.  ASSESSMENT/PLAN: Larry [name] is a very pleasant 73 year old gentleman with a history of splenic marginal zone lymphoma. He had a splenectomy back in 2009 and completed 1 cycle of Rituxan in April 2010. So far, we have seen no evidence of recurrent lymphoma. He has renal insufficiency and sees [place] for his.   He will continue to receive is same dose of testosterone monthly for now. We will continue to monitor his testosterone.   We discussed his CBC and CMP in detail. All other labs still pending.  We will see him back in 4 months for labs and a follow-up appointment.   All questions were answered and he and his wife are in agreement with the plan.   He knows to call here with any questions or concerns and to go to the ED in the event of an emergency. We can  certainly see him sooner if need be.    Eliezer Bottom, NP 11/13/2013 10:48 AM

## 2013-11-14 ENCOUNTER — Telehealth: Payer: Self-pay | Admitting: *Deleted

## 2013-11-14 NOTE — Telephone Encounter (Addendum)
Message copied by Lenn Sink on Fri Nov 14, 2013 10:37 AM ------      Message from: Burney Gauze R      Created: Thu Nov 13, 2013  5:19 PM       Call - iron is a little low.  Testosterone is a little low.  No need to change testosterone dose.  Kidney function is stable.  pete ------Informed pt that iron is low, testosterone is low (but not to make any changes to the testosterone dose), and the kidney function is stable.

## 2013-11-14 NOTE — Telephone Encounter (Addendum)
Message copied by Lenn Sink on Fri Nov 14, 2013 10:39 AM ------      Message from: Burney Gauze R      Created: Thu Nov 13, 2013  5:19 PM       Call - iron is a little low.  Testosterone is a little low.  No need to change testosterone dose.  Kidney function is stable.  pete ------Informed pt that iron is a little low.  Testosterone is a little low.  No need to change testosterone dose.  Kidney function is stable.

## 2013-11-24 DIAGNOSIS — E119 Type 2 diabetes mellitus without complications: Secondary | ICD-10-CM | POA: Diagnosis not present

## 2013-11-24 DIAGNOSIS — M109 Gout, unspecified: Secondary | ICD-10-CM | POA: Diagnosis not present

## 2013-11-24 DIAGNOSIS — I10 Essential (primary) hypertension: Secondary | ICD-10-CM | POA: Diagnosis not present

## 2013-11-24 DIAGNOSIS — E039 Hypothyroidism, unspecified: Secondary | ICD-10-CM | POA: Diagnosis not present

## 2013-12-09 ENCOUNTER — Ambulatory Visit (HOSPITAL_BASED_OUTPATIENT_CLINIC_OR_DEPARTMENT_OTHER): Payer: Medicare Other

## 2013-12-09 VITALS — BP 166/77 | HR 70 | Temp 96.8°F | Resp 20

## 2013-12-09 DIAGNOSIS — E349 Endocrine disorder, unspecified: Secondary | ICD-10-CM

## 2013-12-09 DIAGNOSIS — E291 Testicular hypofunction: Secondary | ICD-10-CM | POA: Diagnosis not present

## 2013-12-09 MED ORDER — TESTOSTERONE CYPIONATE 200 MG/ML IM SOLN
400.0000 mg | INTRAMUSCULAR | Status: DC
  Administered 2013-12-09: 400 mg via INTRAMUSCULAR

## 2013-12-09 MED ORDER — TESTOSTERONE CYPIONATE 200 MG/ML IM SOLN
INTRAMUSCULAR | Status: AC
Start: 2013-12-09 — End: 2013-12-09
  Filled 2013-12-09: qty 2

## 2013-12-09 NOTE — Patient Instructions (Signed)

## 2014-01-06 ENCOUNTER — Ambulatory Visit (HOSPITAL_BASED_OUTPATIENT_CLINIC_OR_DEPARTMENT_OTHER): Payer: Medicare Other

## 2014-01-06 VITALS — BP 165/61 | HR 62 | Temp 97.7°F | Resp 16

## 2014-01-06 DIAGNOSIS — E291 Testicular hypofunction: Secondary | ICD-10-CM

## 2014-01-06 DIAGNOSIS — C8307 Small cell B-cell lymphoma, spleen: Secondary | ICD-10-CM

## 2014-01-06 DIAGNOSIS — E349 Endocrine disorder, unspecified: Secondary | ICD-10-CM

## 2014-01-06 DIAGNOSIS — Z23 Encounter for immunization: Secondary | ICD-10-CM | POA: Diagnosis not present

## 2014-01-06 MED ORDER — INFLUENZA VAC SPLIT QUAD 0.5 ML IM SUSY
0.5000 mL | PREFILLED_SYRINGE | Freq: Once | INTRAMUSCULAR | Status: AC
  Administered 2014-01-06: 0.5 mL via INTRAMUSCULAR
  Filled 2014-01-06: qty 0.5

## 2014-01-06 MED ORDER — TESTOSTERONE CYPIONATE 200 MG/ML IM SOLN
400.0000 mg | INTRAMUSCULAR | Status: DC
  Administered 2014-01-06: 400 mg via INTRAMUSCULAR

## 2014-01-06 MED ORDER — TESTOSTERONE CYPIONATE 200 MG/ML IM SOLN
INTRAMUSCULAR | Status: AC
  Filled 2014-01-06: qty 2

## 2014-01-06 NOTE — Patient Instructions (Signed)
Influenza Virus Vaccine injection What is this medicine? INFLUENZA VIRUS VACCINE (in floo EN zuh VAHY ruhs vak SEEN) helps to reduce the risk of getting influenza also known as the flu. The vaccine only helps protect you against some strains of the flu. This medicine may be used for other purposes; ask your health care provider or pharmacist if you have questions. COMMON BRAND NAME(S): Afluria, Agriflu, Fluarix, Fluarix Quadrivalent, FLUCELVAX, Flulaval, Fluvirin, Fluzone, Fluzone High-Dose, Fluzone Intradermal What should I tell my health care provider before I take this medicine? They need to know if you have any of these conditions: -bleeding disorder like hemophilia -fever or infection -Guillain-Barre syndrome or other neurological problems -immune system problems -infection with the human immunodeficiency virus (HIV) or AIDS -low blood platelet counts -multiple sclerosis -an unusual or allergic reaction to influenza virus vaccine, latex, other medicines, foods, dyes, or preservatives. Different brands of vaccines contain different allergens. Some may contain latex or eggs. Talk to your doctor about your allergies to make sure that you get the right vaccine. -pregnant or trying to get pregnant -breast-feeding How should I use this medicine? This vaccine is for injection into a muscle or under the skin. It is given by a health care professional. A copy of Vaccine Information Statements will be given before each vaccination. Read this sheet carefully each time. The sheet may change frequently. Talk to your healthcare provider to see which vaccines are right for you. Some vaccines should not be used in all age groups. Overdosage: If you think you have taken too much of this medicine contact a poison control center or emergency room at once. NOTE: This medicine is only for you. Do not share this medicine with others. What if I miss a dose? This does not apply. What may interact with this  medicine? -chemotherapy or radiation therapy -medicines that lower your immune system like etanercept, anakinra, infliximab, and adalimumab -medicines that treat or prevent blood clots like warfarin -phenytoin -steroid medicines like prednisone or cortisone -theophylline -vaccines This list may not describe all possible interactions. Give your health care provider a list of all the medicines, herbs, non-prescription drugs, or dietary supplements you use. Also tell them if you smoke, drink alcohol, or use illegal drugs. Some items may interact with your medicine. What should I watch for while using this medicine? Report any side effects that do not go away within 3 days to your doctor or health care professional. Call your health care provider if any unusual symptoms occur within 6 weeks of receiving this vaccine. You may still catch the flu, but the illness is not usually as bad. You cannot get the flu from the vaccine. The vaccine will not protect against colds or other illnesses that may cause fever. The vaccine is needed every year. What side effects may I notice from receiving this medicine? Side effects that you should report to your doctor or health care professional as soon as possible: -allergic reactions like skin rash, itching or hives, swelling of the face, lips, or tongue Side effects that usually do not require medical attention (report to your doctor or health care professional if they continue or are bothersome): -fever -headache -muscle aches and pains -pain, tenderness, redness, or swelling at the injection site -tiredness This list may not describe all possible side effects. Call your doctor for medical advice about side effects. You may report side effects to FDA at 1-800-FDA-1088. Where should I keep my medicine? The vaccine will be given by a health   care professional in a clinic, pharmacy, doctor's office, or other health care setting. You will not be given vaccine doses  to store at home. NOTE: This sheet is a summary. It may not cover all possible information. If you have questions about this medicine, talk to your doctor, pharmacist, or health care provider.  2015, Elsevier/Gold Standard. (2011-10-26 13:08:28) Testosterone injection What is this medicine? TESTOSTERONE (tes TOS ter one) is the main male hormone. It supports normal male development such as muscle growth, facial hair, and deep voice. It is used in males to treat low testosterone levels. This medicine may be used for other purposes; ask your health care provider or pharmacist if you have questions. COMMON BRAND NAME(S): Andro-L.A., Aveed, Delatestryl, Depo-Testosterone, Virilon What should I tell my health care provider before I take this medicine? They need to know if you have any of these conditions: -breast cancer -diabetes -heart disease -kidney disease -liver disease -lung disease -prostate cancer, enlargement -an unusual or allergic reaction to testosterone, other medicines, foods, dyes, or preservatives -pregnant or trying to get pregnant -breast-feeding How should I use this medicine? This medicine is for injection into a muscle. It is usually given by a health care professional in a hospital or clinic setting. Contact your pediatrician regarding the use of this medicine in children. While this medicine may be prescribed for children as young as 62 years of age for selected conditions, precautions do apply. Overdosage: If you think you have taken too much of this medicine contact a poison control center or emergency room at once. NOTE: This medicine is only for you. Do not share this medicine with others. What if I miss a dose? Try not to miss a dose. Your doctor or health care professional will tell you when your next injection is due. Notify the office if you are unable to keep an appointment. What may interact with this medicine? -medicines for diabetes -medicines that treat or  prevent blood clots like warfarin -oxyphenbutazone -propranolol -steroid medicines like prednisone or cortisone This list may not describe all possible interactions. Give your health care provider a list of all the medicines, herbs, non-prescription drugs, or dietary supplements you use. Also tell them if you smoke, drink alcohol, or use illegal drugs. Some items may interact with your medicine. What should I watch for while using this medicine? Visit your doctor or health care professional for regular checks on your progress. They will need to check the level of testosterone in your blood. This medicine is only approved for use in men who have low levels of testosterone related to certain medical conditions. Heart attacks and strokes have been reported with the use of this medicine. Notify your doctor or health care professional and seek emergency treatment if you develop breathing problems; changes in vision; confusion; chest pain or chest tightness; sudden arm pain; severe, sudden headache; trouble speaking or understanding; sudden numbness or weakness of the face, arm or leg; loss of balance or coordination. Talk to your doctor about the risks and benefits of this medicine. This medicine may affect blood sugar levels. If you have diabetes, check with your doctor or health care professional before you change your diet or the dose of your diabetic medicine. This drug is banned from use in athletes by most athletic organizations. What side effects may I notice from receiving this medicine? Side effects that you should report to your doctor or health care professional as soon as possible: -allergic reactions like skin rash, itching or hives,  swelling of the face, lips, or tongue -breast enlargement -breathing problems -changes in mood, especially anger, depression, or rage -dark urine -general ill feeling or flu-like symptoms -light-colored stools -loss of appetite, nausea -nausea,  vomiting -right upper belly pain -stomach pain -swelling of ankles -too frequent or persistent erections -trouble passing urine or change in the amount of urine -unusually weak or tired -yellowing of the eyes or skin Additional side effects that can occur in women include: -deep or hoarse voice -facial hair growth -irregular menstrual periods Side effects that usually do not require medical attention (report to your doctor or health care professional if they continue or are bothersome): -acne -change in sex drive or performance -hair loss -headache This list may not describe all possible side effects. Call your doctor for medical advice about side effects. You may report side effects to FDA at 1-800-FDA-1088. Where should I keep my medicine? Keep out of the reach of children. This medicine can be abused. Keep your medicine in a safe place to protect it from theft. Do not share this medicine with anyone. Selling or giving away this medicine is dangerous and against the law. Store at room temperature between 20 and 25 degrees C (68 and 77 degrees F). Do not freeze. Protect from light. Follow the directions for the product you are prescribed. Throw away any unused medicine after the expiration date. NOTE: This sheet is a summary. It may not cover all possible information. If you have questions about this medicine, talk to your doctor, pharmacist, or health care provider.  2015, [employer]vier/Gold Standard. (2013-07-03 87:86:76)

## 2014-02-10 ENCOUNTER — Ambulatory Visit (HOSPITAL_BASED_OUTPATIENT_CLINIC_OR_DEPARTMENT_OTHER): Payer: Medicare Other

## 2014-02-10 VITALS — BP 160/62 | HR 66 | Temp 97.8°F

## 2014-02-10 DIAGNOSIS — E291 Testicular hypofunction: Secondary | ICD-10-CM

## 2014-02-10 DIAGNOSIS — E349 Endocrine disorder, unspecified: Secondary | ICD-10-CM

## 2014-02-10 MED ORDER — TESTOSTERONE CYPIONATE 200 MG/ML IM SOLN
400.0000 mg | INTRAMUSCULAR | Status: DC
  Administered 2014-02-10: 400 mg via INTRAMUSCULAR

## 2014-02-10 MED ORDER — TESTOSTERONE CYPIONATE 200 MG/ML IM SOLN
INTRAMUSCULAR | Status: AC
  Filled 2014-02-10: qty 2

## 2014-02-10 NOTE — Patient Instructions (Signed)

## 2014-03-03 DIAGNOSIS — I10 Essential (primary) hypertension: Secondary | ICD-10-CM | POA: Diagnosis not present

## 2014-03-03 DIAGNOSIS — N183 Chronic kidney disease, stage 3 (moderate): Secondary | ICD-10-CM | POA: Diagnosis not present

## 2014-03-10 ENCOUNTER — Encounter: Payer: Self-pay | Admitting: Hematology & Oncology

## 2014-03-10 ENCOUNTER — Ambulatory Visit (HOSPITAL_BASED_OUTPATIENT_CLINIC_OR_DEPARTMENT_OTHER): Payer: Medicare Other

## 2014-03-10 ENCOUNTER — Ambulatory Visit (HOSPITAL_BASED_OUTPATIENT_CLINIC_OR_DEPARTMENT_OTHER): Payer: Medicare Other | Admitting: Hematology & Oncology

## 2014-03-10 ENCOUNTER — Encounter: Payer: Medicare Other | Admitting: Lab

## 2014-03-10 VITALS — BP 162/59 | HR 71 | Temp 97.9°F | Resp 18 | Ht 67.0 in | Wt 181.0 lb

## 2014-03-10 DIAGNOSIS — Z23 Encounter for immunization: Secondary | ICD-10-CM | POA: Diagnosis not present

## 2014-03-10 DIAGNOSIS — Z8579 Personal history of other malignant neoplasms of lymphoid, hematopoietic and related tissues: Secondary | ICD-10-CM

## 2014-03-10 DIAGNOSIS — E291 Testicular hypofunction: Secondary | ICD-10-CM

## 2014-03-10 DIAGNOSIS — E349 Endocrine disorder, unspecified: Secondary | ICD-10-CM

## 2014-03-10 DIAGNOSIS — C8307 Small cell B-cell lymphoma, spleen: Secondary | ICD-10-CM

## 2014-03-10 DIAGNOSIS — C8587 Other specified types of non-Hodgkin lymphoma, spleen: Secondary | ICD-10-CM | POA: Diagnosis not present

## 2014-03-10 LAB — CBC WITH DIFFERENTIAL (CANCER CENTER ONLY)
BASO#: 0 10*3/uL (ref 0.0–0.2)
BASO%: 0.5 % (ref 0.0–2.0)
EOS%: 5.8 % (ref 0.0–7.0)
Eosinophils Absolute: 0.4 10*3/uL (ref 0.0–0.5)
HCT: 35.5 % — ABNORMAL LOW (ref 38.7–49.9)
HEMOGLOBIN: 12.3 g/dL — AB (ref 13.0–17.1)
LYMPH#: 1.6 10*3/uL (ref 0.9–3.3)
LYMPH%: 25.8 % (ref 14.0–48.0)
MCH: 34.4 pg — AB (ref 28.0–33.4)
MCHC: 34.6 g/dL (ref 32.0–35.9)
MCV: 99 fL — AB (ref 82–98)
MONO#: 1.1 10*3/uL — ABNORMAL HIGH (ref 0.1–0.9)
MONO%: 17 % — AB (ref 0.0–13.0)
NEUT#: 3.2 10*3/uL (ref 1.5–6.5)
NEUT%: 50.9 % (ref 40.0–80.0)
Platelets: 231 10*3/uL (ref 145–400)
RBC: 3.58 10*6/uL — AB (ref 4.20–5.70)
RDW: 14.9 % (ref 11.1–15.7)
WBC: 6.4 10*3/uL (ref 4.0–10.0)

## 2014-03-10 LAB — COMPREHENSIVE METABOLIC PANEL
ALBUMIN: 4.1 g/dL (ref 3.5–5.2)
ALK PHOS: 83 U/L (ref 39–117)
ALT: 12 U/L (ref 0–53)
AST: 17 U/L (ref 0–37)
BILIRUBIN TOTAL: 0.6 mg/dL (ref 0.2–1.2)
BUN: 40 mg/dL — AB (ref 6–23)
CO2: 25 mEq/L (ref 19–32)
Calcium: 9.6 mg/dL (ref 8.4–10.5)
Chloride: 104 mEq/L (ref 96–112)
Creatinine, Ser: 1.71 mg/dL — ABNORMAL HIGH (ref 0.50–1.35)
Glucose, Bld: 156 mg/dL — ABNORMAL HIGH (ref 70–99)
Potassium: 4.4 mEq/L (ref 3.5–5.3)
Sodium: 139 mEq/L (ref 135–145)
Total Protein: 7 g/dL (ref 6.0–8.3)

## 2014-03-10 LAB — TESTOSTERONE: TESTOSTERONE: 191 ng/dL — AB (ref 300–890)

## 2014-03-10 LAB — IRON AND TIBC CHCC
%SAT: 35 % (ref 20–55)
IRON: 88 ug/dL (ref 42–163)
TIBC: 253 ug/dL (ref 202–409)
UIBC: 164 ug/dL (ref 117–376)

## 2014-03-10 LAB — FERRITIN CHCC: Ferritin: 93 ng/ml (ref 22–316)

## 2014-03-10 MED ORDER — TESTOSTERONE CYPIONATE 200 MG/ML IM SOLN
400.0000 mg | INTRAMUSCULAR | Status: DC
  Administered 2014-03-10: 400 mg via INTRAMUSCULAR

## 2014-03-10 MED ORDER — PNEUMOCOCCAL VAC POLYVALENT 25 MCG/0.5ML IJ INJ
0.5000 mL | INJECTION | INTRAMUSCULAR | Status: AC
  Administered 2014-03-10: 0.5 mL via INTRAMUSCULAR
  Filled 2014-03-10: qty 0.5

## 2014-03-10 MED ORDER — TESTOSTERONE CYPIONATE 200 MG/ML IM SOLN
INTRAMUSCULAR | Status: AC
  Filled 2014-03-10: qty 2

## 2014-03-10 NOTE — Progress Notes (Signed)
Hematology and Oncology Follow Up Visit  Larry [name] [id number] April 05, 1941 73 y.o. 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 3. Chronic renal insufficiency.  Current Therapy:   Testosterone 400 mg IM q.month.     Interim History:  Mr.  [name] is back for follow-up. We last saw him back in July.  unfortunately, his wife fractured her left ankle. She alsoa dislocation of the right elbow. She did not require any surgery. She is getting some rehabilitation.  He's been doing okay. He does see the nephrologist for his kidney function. He says that they are fairly happy with his kidney function.  He's had no problems with fever. He's had no problems with infection.  He's not had a pneumonia vaccine for 5 years what he says. He needs to have this done every 3 years because of his splenectomy.  He's had no rashes. He's had some chronic leg swelling. This is not worsened. The is no change in bowel or bladder habits.  His appetite has been doing good. He's had no cough.    overall, his performance status is ECOG 1.         Medications: Current outpatient prescriptions: amLODipine (NORVASC) 10 MG tablet, Take 10 mg by mouth daily.  , Disp: , Rfl: ;  doxazosin (CARDURA) 2 MG tablet, Take 2 mg by mouth at bedtime. TAKE 4 TABS AT BEDTIME, Disp: , Rfl: ;  febuxostat (ULORIC) 40 MG tablet, Take 1 tablet (40 mg total) by mouth daily., Disp: 30 tablet, Rfl: 6;  furosemide (LASIX) 40 MG tablet, Take 40 mg by mouth daily. , Disp: , Rfl:  levothyroxine (SYNTHROID, LEVOTHROID) 150 MCG tablet, Take 150 mcg by mouth daily.  , Disp: , Rfl: ;  omeprazole (PRILOSEC) 20 MG capsule, 20 mg 2 (two) times daily. , Disp: , Rfl: ;  sitaGLIPtin (JANUVIA) 50 MG tablet, Take 50 mg by mouth daily., Disp: , Rfl: ;  triamcinolone cream (KENALOG) 0.1 %, as needed. , Disp: , Rfl:  No current facility-administered medications for  this visit. Facility-Administered Medications Ordered in Other Visits: [START ON 03/11/2014] pneumococcal 23 valent vaccine (PNU-IMMUNE) injection 0.5 mL, 0.5 mL, Intramuscular, Tomorrow-1000, Volanda Napoleon, MD;  testosterone cypionate (DEPOTESTOTERONE CYPIONATE) injection 400 mg, 400 mg, Intramuscular, Q28 days, Volanda Napoleon, MD  Allergies:  Allergies  Allergen Reactions  . Iohexol     Pt states has history of reaction in the past, can not remember what exactly happened, but was told by the dr never to have the contrast again.    Past Medical History, Surgical history, Social history, and Family History were reviewed and updated.  Review of Systems: As above  Physical Exam:  height is 5\' 7"  (1.702 m) and weight is 181 lb (82.101 kg). His oral temperature is 97.9 F (36.6 C). His blood pressure is 162/59 and his pulse is 71. His respiration is 18.   Well-developed and well-nourished white gentleman in no obvious distress. Head exam shows no ocular or oral lesions. He has no palpable cervical or supraclavicular lymph nodes. Lungs are clear. Cardiac exam regular rate and rhythm with no murmurs, rubs or bruits. Abdomen is soft. Has well-healed splenectomy scar. He has good bowel sounds. There is no fluid wave. There is no palpable liver edge. Back exam shows no tenderness over the spine, ribs or hips. Extremities shows some chronic nonpitting edema in his lower legs. His right  leg is lightly worse than his left leg. He has some stasis dermatitis type changes noted. Neurological exam shows no focal neurological deficits. Skin exam shows no rashes, ecchymoses or petechia.  Lab Results  Component Value Date   WBC 6.4 03/10/2014   HGB 12.3* 03/10/2014   HCT 35.5* 03/10/2014   MCV 99* 03/10/2014   PLT 231 03/10/2014     Chemistry      Component Value Date/Time   NA 141 11/13/2013 0933   NA 139 07/15/2013 0913   K 3.9 11/13/2013 0933   K 4.3 07/15/2013 0913   CL 101 11/13/2013 0933    CL 105 07/15/2013 0913   CO2 26 11/13/2013 0933   CO2 24 07/15/2013 0913   BUN 34* 11/13/2013 0933   BUN 40* 07/15/2013 0913   CREATININE 1.8* 11/13/2013 0933   CREATININE 1.75* 07/15/2013 0913      Component Value Date/Time   CALCIUM 8.9 11/13/2013 0933   CALCIUM 9.3 07/15/2013 0913   ALKPHOS 76 11/13/2013 0933   ALKPHOS 85 07/15/2013 0913   AST 23 11/13/2013 0933   AST 17 07/15/2013 0913   ALT 10 11/13/2013 0933   ALT 12 07/15/2013 0913   BILITOT 0.60 11/13/2013 0933   BILITOT 0.5 07/15/2013 0913         Impression and Plan: Larry [name] is 73 year old gentleman. He has a history of splenic marginal cell lymphoma. He underwent splenectomy. He did receive Rituxan.  He had his splenectomy back in October 2009. He had one cycle of Rituxan. This was completed in April 2010.  So far, there's no evidence of recurrent disease.  We will go ahead and plan to get him back to see [place] in another 4 months.  He comes in monthly for his testosterone injection.     Volanda Napoleon, MD 11/10/201510:22 AM

## 2014-03-10 NOTE — Patient Instructions (Signed)
Testosterone injection What is this medicine? TESTOSTERONE (tes TOS ter one) is the main male hormone. It supports normal male development such as muscle growth, facial hair, and deep voice. It is used in males to treat low testosterone levels. This medicine may be used for other purposes; ask your health care provider or pharmacist if you have questions. COMMON BRAND NAME(S): Andro-L.A., Aveed, Delatestryl, Depo-Testosterone, Virilon What should I tell my health care provider before I take this medicine? They need to know if you have any of these conditions: -breast cancer -diabetes -heart disease -kidney disease -liver disease -lung disease -prostate cancer, enlargement -an unusual or allergic reaction to testosterone, other medicines, foods, dyes, or preservatives -pregnant or trying to get pregnant -breast-feeding How should I use this medicine? This medicine is for injection into a muscle. It is usually given by a health care professional in a hospital or clinic setting. Contact your pediatrician regarding the use of this medicine in children. While this medicine may be prescribed for children as young as 12 years of age for selected conditions, precautions do apply. Overdosage: If you think you have taken too much of this medicine contact a poison control center or emergency room at once. NOTE: This medicine is only for you. Do not share this medicine with others. What if I miss a dose? Try not to miss a dose. Your doctor or health care professional will tell you when your next injection is due. Notify the office if you are unable to keep an appointment. What may interact with this medicine? -medicines for diabetes -medicines that treat or prevent blood clots like warfarin -oxyphenbutazone -propranolol -steroid medicines like prednisone or cortisone This list may not describe all possible interactions. Give your health care provider a list of all the medicines, herbs,  non-prescription drugs, or dietary supplements you use. Also tell them if you smoke, drink alcohol, or use illegal drugs. Some items may interact with your medicine. What should I watch for while using this medicine? Visit your doctor or health care professional for regular checks on your progress. They will need to check the level of testosterone in your blood. This medicine is only approved for use in men who have low levels of testosterone related to certain medical conditions. Heart attacks and strokes have been reported with the use of this medicine. Notify your doctor or health care professional and seek emergency treatment if you develop breathing problems; changes in vision; confusion; chest pain or chest tightness; sudden arm pain; severe, sudden headache; trouble speaking or understanding; sudden numbness or weakness of the face, arm or leg; loss of balance or coordination. Talk to your doctor about the risks and benefits of this medicine. This medicine may affect blood sugar levels. If you have diabetes, check with your doctor or health care professional before you change your diet or the dose of your diabetic medicine. This drug is banned from use in athletes by most athletic organizations. What side effects may I notice from receiving this medicine? Side effects that you should report to your doctor or health care professional as soon as possible: -allergic reactions like skin rash, itching or hives, swelling of the face, lips, or tongue -breast enlargement -breathing problems -changes in mood, especially anger, depression, or rage -dark urine -general ill feeling or flu-like symptoms -light-colored stools -loss of appetite, nausea -nausea, vomiting -right upper belly pain -stomach pain -swelling of ankles -too frequent or persistent erections -trouble passing urine or change in the amount of urine -unusually   weak or tired -yellowing of the eyes or skin Additional side effects  that can occur in women include: -deep or hoarse voice -facial hair growth -irregular menstrual periods Side effects that usually do not require medical attention (report to your doctor or health care professional if they continue or are bothersome): -acne -change in sex drive or performance -hair loss -headache This list may not describe all possible side effects. Call your doctor for medical advice about side effects. You may report side effects to FDA at 1-800-FDA-1088. Where should I keep my medicine? Keep out of the reach of children. This medicine can be abused. Keep your medicine in a safe place to protect it from theft. Do not share this medicine with anyone. Selling or giving away this medicine is dangerous and against the law. Store at room temperature between 20 and 25 degrees C (68 and 77 degrees F). Do not freeze. Protect from light. Follow the directions for the product you are prescribed. Throw away any unused medicine after the expiration date. NOTE: This sheet is a summary. It may not cover all possible information. If you have questions about this medicine, talk to your doctor, pharmacist, or health care provider.  2015, Elsevier/Gold Standard. (2013-07-03 36:14:43) Pneumococcal Vaccine, Polyvalent solution for injection What is this medicine? PNEUMOCOCCAL VACCINE, POLYVALENT (NEU mo KOK al vak SEEN, pol ee VEY luhnt) is a vaccine to prevent pneumococcus bacteria infection. These bacteria are a major cause of ear infections, Strep throat infections, and serious pneumonia, meningitis, or blood infections worldwide. These vaccines help the body to produce antibodies (protective substances) that help your body defend against these bacteria. This vaccine is recommended for people 42 years of age and older with health problems. It is also recommended for all adults over 32 years old. This vaccine will not treat an infection. This medicine may be used for other purposes; ask your health  care provider or pharmacist if you have questions. COMMON BRAND NAME(S): Pneumovax 23 What should I tell my health care provider before I take this medicine? They need to know if you have any of these conditions: -bleeding problems -bone marrow or organ transplant -cancer, Hodgkin's disease -fever -infection -immune system problems -low platelet count in the blood -seizures -an unusual or allergic reaction to pneumococcal vaccine, diphtheria toxoid, other vaccines, latex, other medicines, foods, dyes, or preservatives -pregnant or trying to get pregnant -breast-feeding How should I use this medicine? This vaccine is for injection into a muscle or under the skin. It is given by a health care professional. A copy of Vaccine Information Statements will be given before each vaccination. Read this sheet carefully each time. The sheet may change frequently. Talk to your pediatrician regarding the use of this medicine in children. While this drug may be prescribed for children as young as 75 years of age for selected conditions, precautions do apply. Overdosage: If you think you have taken too much of this medicine contact a poison control center or emergency room at once. NOTE: This medicine is only for you. Do not share this medicine with others. What if I miss a dose? It is important not to miss your dose. Call your doctor or health care professional if you are unable to keep an appointment. What may interact with this medicine? -medicines for cancer chemotherapy -medicines that suppress your immune function -medicines that treat or prevent blood clots like warfarin, enoxaparin, and dalteparin -steroid medicines like prednisone or cortisone This list may not describe all possible interactions.  Give your health care provider a list of all the medicines, herbs, non-prescription drugs, or dietary supplements you use. Also tell them if you smoke, drink alcohol, or use illegal drugs. Some items may  interact with your medicine. What should I watch for while using this medicine? Mild fever and pain should go away in 3 days or less. Report any unusual symptoms to your doctor or health care professional. What side effects may I notice from receiving this medicine? Side effects that you should report to your doctor or health care professional as soon as possible: -allergic reactions like skin rash, itching or hives, swelling of the face, lips, or tongue -breathing problems -confused -fever over 102 degrees F -pain, tingling, numbness in the hands or feet -seizures -unusual bleeding or bruising -unusual muscle weakness Side effects that usually do not require medical attention (report to your doctor or health care professional if they continue or are bothersome): -aches and pains -diarrhea -fever of 102 degrees F or less -headache -irritable -loss of appetite -pain, tender at site where injected -trouble sleeping This list may not describe all possible side effects. Call your doctor for medical advice about side effects. You may report side effects to FDA at 1-800-FDA-1088. Where should I keep my medicine? This does not apply. This vaccine is given in a clinic, pharmacy, doctor's office, or other health care setting and will not be stored at home. NOTE: This sheet is a summary. It may not cover all possible information. If you have questions about this medicine, talk to your doctor, pharmacist, or health care provider.  2015, Elsevier/Gold Standard. (2007-11-22 14:32:37)

## 2014-03-11 NOTE — Progress Notes (Signed)
This encounter was created in error - please disregard.

## 2014-04-17 ENCOUNTER — Ambulatory Visit (HOSPITAL_BASED_OUTPATIENT_CLINIC_OR_DEPARTMENT_OTHER): Payer: Medicare Other

## 2014-04-17 DIAGNOSIS — E291 Testicular hypofunction: Secondary | ICD-10-CM | POA: Diagnosis not present

## 2014-04-17 DIAGNOSIS — E349 Endocrine disorder, unspecified: Secondary | ICD-10-CM

## 2014-04-17 MED ORDER — TESTOSTERONE CYPIONATE 200 MG/ML IM SOLN
400.0000 mg | INTRAMUSCULAR | Status: DC
  Administered 2014-04-17: 400 mg via INTRAMUSCULAR

## 2014-04-17 MED ORDER — TESTOSTERONE CYPIONATE 200 MG/ML IM SOLN
INTRAMUSCULAR | Status: AC
  Filled 2014-04-17: qty 2

## 2014-04-17 NOTE — Patient Instructions (Signed)

## 2014-05-19 ENCOUNTER — Ambulatory Visit (HOSPITAL_BASED_OUTPATIENT_CLINIC_OR_DEPARTMENT_OTHER): Payer: Medicare Other

## 2014-05-19 DIAGNOSIS — E349 Endocrine disorder, unspecified: Secondary | ICD-10-CM

## 2014-05-19 DIAGNOSIS — E291 Testicular hypofunction: Secondary | ICD-10-CM

## 2014-05-19 MED ORDER — TESTOSTERONE CYPIONATE 200 MG/ML IM SOLN
INTRAMUSCULAR | Status: AC
  Filled 2014-05-19: qty 2

## 2014-05-19 MED ORDER — TESTOSTERONE CYPIONATE 200 MG/ML IM SOLN
400.0000 mg | INTRAMUSCULAR | Status: DC
  Administered 2014-05-19: 400 mg via INTRAMUSCULAR

## 2014-05-19 NOTE — Patient Instructions (Signed)

## 2014-06-16 ENCOUNTER — Ambulatory Visit (HOSPITAL_BASED_OUTPATIENT_CLINIC_OR_DEPARTMENT_OTHER): Payer: Medicare Other

## 2014-06-16 DIAGNOSIS — E291 Testicular hypofunction: Secondary | ICD-10-CM

## 2014-06-16 DIAGNOSIS — E349 Endocrine disorder, unspecified: Secondary | ICD-10-CM

## 2014-06-16 MED ORDER — TESTOSTERONE CYPIONATE 200 MG/ML IM SOLN
400.0000 mg | INTRAMUSCULAR | Status: DC
  Administered 2014-06-16: 400 mg via INTRAMUSCULAR

## 2014-06-16 MED ORDER — TESTOSTERONE CYPIONATE 200 MG/ML IM SOLN
INTRAMUSCULAR | Status: AC
  Filled 2014-06-16: qty 2

## 2014-06-16 NOTE — Patient Instructions (Signed)

## 2014-07-16 ENCOUNTER — Encounter: Payer: Self-pay | Admitting: Family

## 2014-07-16 ENCOUNTER — Ambulatory Visit (HOSPITAL_BASED_OUTPATIENT_CLINIC_OR_DEPARTMENT_OTHER): Payer: Medicare Other

## 2014-07-16 ENCOUNTER — Ambulatory Visit (HOSPITAL_BASED_OUTPATIENT_CLINIC_OR_DEPARTMENT_OTHER): Payer: Medicare Other | Admitting: Family

## 2014-07-16 ENCOUNTER — Other Ambulatory Visit (HOSPITAL_BASED_OUTPATIENT_CLINIC_OR_DEPARTMENT_OTHER): Payer: Medicare Other | Admitting: Lab

## 2014-07-16 VITALS — BP 164/55 | HR 65 | Temp 97.5°F | Resp 18 | Ht 67.0 in | Wt 180.0 lb

## 2014-07-16 DIAGNOSIS — C8307 Small cell B-cell lymphoma, spleen: Secondary | ICD-10-CM

## 2014-07-16 DIAGNOSIS — E291 Testicular hypofunction: Secondary | ICD-10-CM

## 2014-07-16 DIAGNOSIS — C8587 Other specified types of non-Hodgkin lymphoma, spleen: Secondary | ICD-10-CM

## 2014-07-16 DIAGNOSIS — N189 Chronic kidney disease, unspecified: Secondary | ICD-10-CM | POA: Diagnosis not present

## 2014-07-16 DIAGNOSIS — E349 Endocrine disorder, unspecified: Secondary | ICD-10-CM

## 2014-07-16 DIAGNOSIS — C8387 Other non-follicular lymphoma, spleen: Secondary | ICD-10-CM | POA: Diagnosis not present

## 2014-07-16 LAB — CBC WITH DIFFERENTIAL (CANCER CENTER ONLY)
BASO#: 0.1 10*3/uL (ref 0.0–0.2)
BASO%: 0.8 % (ref 0.0–2.0)
EOS%: 5.5 % (ref 0.0–7.0)
Eosinophils Absolute: 0.3 10*3/uL (ref 0.0–0.5)
HEMATOCRIT: 33.9 % — AB (ref 38.7–49.9)
HEMOGLOBIN: 11.6 g/dL — AB (ref 13.0–17.1)
LYMPH#: 1.5 10*3/uL (ref 0.9–3.3)
LYMPH%: 23.5 % (ref 14.0–48.0)
MCH: 34.5 pg — ABNORMAL HIGH (ref 28.0–33.4)
MCHC: 34.2 g/dL (ref 32.0–35.9)
MCV: 101 fL — ABNORMAL HIGH (ref 82–98)
MONO#: 0.9 10*3/uL (ref 0.1–0.9)
MONO%: 15 % — AB (ref 0.0–13.0)
NEUT#: 3.4 10*3/uL (ref 1.5–6.5)
NEUT%: 55.2 % (ref 40.0–80.0)
Platelets: 224 10*3/uL (ref 145–400)
RBC: 3.36 10*6/uL — ABNORMAL LOW (ref 4.20–5.70)
RDW: 13.7 % (ref 11.1–15.7)
WBC: 6.2 10*3/uL (ref 4.0–10.0)

## 2014-07-16 LAB — COMPREHENSIVE METABOLIC PANEL (CC13)
ALT: 16 U/L (ref 0–55)
AST: 19 U/L (ref 5–34)
Albumin: 3.7 g/dL (ref 3.5–5.0)
Alkaline Phosphatase: 91 U/L (ref 40–150)
Anion Gap: 9 mEq/L (ref 3–11)
BUN: 44.7 mg/dL — ABNORMAL HIGH (ref 7.0–26.0)
CO2: 23 mEq/L (ref 22–29)
Calcium: 9.2 mg/dL (ref 8.4–10.4)
Chloride: 107 mEq/L (ref 98–109)
Creatinine: 2 mg/dL — ABNORMAL HIGH (ref 0.7–1.3)
EGFR: 32 mL/min/{1.73_m2} — ABNORMAL LOW (ref >90)
Glucose: 166 mg/dl — ABNORMAL HIGH (ref 70–140)
Potassium: 5 mEq/L (ref 3.5–5.1)
Sodium: 138 mEq/L (ref 136–145)
Total Bilirubin: 0.78 mg/dL (ref 0.20–1.20)
Total Protein: 6.9 g/dL (ref 6.4–8.3)

## 2014-07-16 MED ORDER — TESTOSTERONE CYPIONATE 200 MG/ML IM SOLN
INTRAMUSCULAR | Status: AC
  Filled 2014-07-16: qty 2

## 2014-07-16 MED ORDER — TESTOSTERONE CYPIONATE 200 MG/ML IM SOLN
400.0000 mg | INTRAMUSCULAR | Status: DC
  Administered 2014-07-16: 400 mg via INTRAMUSCULAR

## 2014-07-16 NOTE — Patient Instructions (Signed)

## 2014-07-16 NOTE — Progress Notes (Signed)
Hematology and Oncology Follow Up Visit  JEPTHA HINNENKAMP [id number] [birth year] 74 y.o. 07/16/2014   Principle Diagnosis:  1. 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  2. Hypotestosteronemia.  3. Chronic renal insufficiency.  Current Therapy:   Testosterone 400 mg IM once a month    Interim History: Ms. Panico is a very pleasant 74 yo male here today for a follow-up. He is a doing very well. He continues to get Testosterone injections monthly. In November, his testosterone level was 191.   He denies fatigue, headaches, dizziness, blurred vision, fever, chills, n/v, rash, cough, SOB, chest pain, palpitations, abdominal pain, constipation, diarrhea, problems urinating, blood in urine or stool. He has chronic swelling in his legs due to his renal insufficiency and takes Lasix daily. He is followed by nephrology. No tenderness, numbness or tingling in his extremities.  His appetite is good and he is staying hydrated. His weight is stable.    Medications:    Medication List       This list is accurate as of: 07/16/14 11:27 AM.  Always use your most recent med list.               amLODipine 10 MG tablet  Commonly known as:  NORVASC  Take 10 mg by mouth daily.     doxazosin 2 MG tablet  Commonly known as:  CARDURA  Take 2 mg by mouth at bedtime. TAKE 4 TABS AT BEDTIME     febuxostat 40 MG tablet  Commonly known as:  ULORIC  Take 1 tablet (40 mg total) by mouth daily.     furosemide 40 MG tablet  Commonly known as:  LASIX  Take 40 mg by mouth daily.     levothyroxine 150 MCG tablet  Commonly known as:  SYNTHROID, LEVOTHROID  Take 150 mcg by mouth daily.     omeprazole 20 MG capsule  Commonly known as:  PRILOSEC  20 mg 2 (two) times daily.     sitaGLIPtin 50 MG tablet  Commonly known as:  JANUVIA  Take 50 mg by mouth daily.     triamcinolone cream 0.1 %  Commonly known as:  KENALOG  as needed.        Allergies:  Allergies  Allergen  Reactions  . Iohexol     Pt states has history of reaction in the past, can not remember what exactly happened, but was told by the dr never to have the contrast again.    Past Medical History, Surgical history, Social history, and Family History were reviewed and updated.  Review of Systems: All other 10 point review of systems is negative.   Physical Exam:  height is 5\' 7"  (1.702 m) and weight is 180 lb (81.647 kg). His oral temperature is 97.5 F (36.4 C). His blood pressure is 164/55 and his pulse is 65. His respiration is 18.   Wt Readings from Last 3 Encounters:  07/16/14 180 lb (81.647 kg)  03/10/14 181 lb (82.101 kg)  11/13/13 178 lb (80.74 kg)    Ocular: Sclerae unicteric, pupils equal, round and reactive to light Ear-nose-throat: Oropharynx clear, dentition fair Lymphatic: No cervical or supraclavicular adenopathy Lungs no rales or rhonchi, good excursion bilaterally Heart regular rate and rhythm, no murmur appreciated Abd soft, nontender, positive bowel sounds MSK no focal spinal tenderness, no joint edema Neuro: non-focal, well-oriented, appropriate affect  Lab Results  Component Value Date   WBC 6.2 07/16/2014   HGB 11.6* 07/16/2014   HCT 33.9*  07/16/2014   MCV 101* 07/16/2014   PLT 224 07/16/2014   Lab Results  Component Value Date   FERRITIN 93 03/10/2014   IRON 88 03/10/2014   TIBC 253 03/10/2014   UIBC 164 03/10/2014   IRONPCTSAT 35 03/10/2014   Lab Results  Component Value Date   RETICCTPCT 0.9 03/29/2011   RBC 3.36* 07/16/2014   RETICCTABS 31.8 03/29/2011   No results found for: KPAFRELGTCHN, LAMBDASER, KAPLAMBRATIO No results found for: Kandis Cocking, [facility] Lab Results  Component Value Date   TOTALPROTELP 6.7 07/14/2011   ALBUMINELP 56.6 07/14/2011   A1GS 5.7* 07/14/2011   A2GS 12.7* 07/14/2011   BETS 4.9 07/14/2011   BETA2SER 3.8 07/14/2011   GAMS 16.3 07/14/2011   MSPIKE NOT DET 07/14/2011   SPEI * 07/14/2011     Chemistry       Component Value Date/Time   NA 139 03/10/2014 0925   NA 141 11/13/2013 0933   K 4.4 03/10/2014 0925   K 3.9 11/13/2013 0933   CL 104 03/10/2014 0925   CL 101 11/13/2013 0933   CO2 25 03/10/2014 0925   CO2 26 11/13/2013 0933   BUN 40* 03/10/2014 0925   BUN 34* 11/13/2013 0933   CREATININE 1.71* 03/10/2014 0925   CREATININE 1.8* 11/13/2013 0933      Component Value Date/Time   CALCIUM 9.6 03/10/2014 0925   CALCIUM 8.9 11/13/2013 0933   ALKPHOS 83 03/10/2014 0925   ALKPHOS 76 11/13/2013 0933   AST 17 03/10/2014 0925   AST 23 11/13/2013 0933   ALT 12 03/10/2014 0925   ALT 10 11/13/2013 0933   BILITOT 0.6 03/10/2014 0925   BILITOT 0.60 11/13/2013 0933     Impression and Plan: Mr. Smoak is a very pleasant 74 year old gentleman with a history of splenic marginal zone lymphoma. He had a splenectomy back in 2009 and completed 1 cycle of Rituxan in April 2010. He is doing quite well and has shown evidence of recurrent lymphoma.  He will get testosterone today and we will keep him on his monthly schedule. We will continue to monitor his testosterone.  We will see him back in 4 months for labs and a follow-up appointment.  He knows to call here with any questions or concerns and to go to the ED in the event of an emergency. We can certainly see him sooner if need be.    Eliezer Bottom, NP 3/17/201611:27 AM

## 2014-07-17 LAB — TESTOSTERONE: Testosterone: 315 ng/dL (ref 300–890)

## 2014-08-07 ENCOUNTER — Telehealth: Payer: Self-pay | Admitting: Hematology & Oncology

## 2014-08-07 NOTE — Telephone Encounter (Signed)
Pt moved 4-14 to 4-15

## 2014-08-13 ENCOUNTER — Ambulatory Visit: Payer: Medicare Other

## 2014-08-14 ENCOUNTER — Ambulatory Visit (HOSPITAL_BASED_OUTPATIENT_CLINIC_OR_DEPARTMENT_OTHER): Payer: Medicare Other

## 2014-08-14 VITALS — BP 163/83 | HR 70 | Temp 97.5°F | Resp 16 | Wt 180.0 lb

## 2014-08-14 DIAGNOSIS — E291 Testicular hypofunction: Secondary | ICD-10-CM

## 2014-08-14 DIAGNOSIS — E349 Endocrine disorder, unspecified: Secondary | ICD-10-CM

## 2014-08-14 MED ORDER — TESTOSTERONE CYPIONATE 200 MG/ML IM SOLN
400.0000 mg | INTRAMUSCULAR | Status: DC
  Administered 2014-08-14: 400 mg via INTRAMUSCULAR

## 2014-08-14 MED ORDER — TESTOSTERONE CYPIONATE 200 MG/ML IM SOLN
INTRAMUSCULAR | Status: AC
  Filled 2014-08-14: qty 2

## 2014-08-14 NOTE — Patient Instructions (Signed)

## 2014-08-28 DIAGNOSIS — E119 Type 2 diabetes mellitus without complications: Secondary | ICD-10-CM | POA: Diagnosis not present

## 2014-08-28 DIAGNOSIS — E039 Hypothyroidism, unspecified: Secondary | ICD-10-CM | POA: Diagnosis not present

## 2014-08-28 DIAGNOSIS — I10 Essential (primary) hypertension: Secondary | ICD-10-CM | POA: Diagnosis not present

## 2014-08-28 DIAGNOSIS — M159 Polyosteoarthritis, unspecified: Secondary | ICD-10-CM | POA: Diagnosis not present

## 2014-09-10 ENCOUNTER — Ambulatory Visit (HOSPITAL_BASED_OUTPATIENT_CLINIC_OR_DEPARTMENT_OTHER): Payer: Medicare Other

## 2014-09-10 VITALS — BP 168/65 | HR 67 | Temp 98.0°F | Resp 16 | Wt 183.0 lb

## 2014-09-10 DIAGNOSIS — E349 Endocrine disorder, unspecified: Secondary | ICD-10-CM

## 2014-09-10 DIAGNOSIS — E291 Testicular hypofunction: Secondary | ICD-10-CM | POA: Diagnosis present

## 2014-09-10 MED ORDER — TESTOSTERONE CYPIONATE 200 MG/ML IM SOLN
400.0000 mg | INTRAMUSCULAR | Status: DC
  Administered 2014-09-10: 400 mg via INTRAMUSCULAR

## 2014-09-10 MED ORDER — TESTOSTERONE CYPIONATE 200 MG/ML IM SOLN
INTRAMUSCULAR | Status: AC
  Filled 2014-09-10: qty 2

## 2014-09-10 NOTE — Patient Instructions (Signed)

## 2014-09-18 DIAGNOSIS — I10 Essential (primary) hypertension: Secondary | ICD-10-CM | POA: Diagnosis not present

## 2014-09-18 DIAGNOSIS — N183 Chronic kidney disease, stage 3 (moderate): Secondary | ICD-10-CM | POA: Diagnosis not present

## 2014-10-08 ENCOUNTER — Ambulatory Visit: Payer: Medicare Other

## 2014-10-12 ENCOUNTER — Ambulatory Visit (HOSPITAL_BASED_OUTPATIENT_CLINIC_OR_DEPARTMENT_OTHER): Payer: Medicare Other

## 2014-10-12 VITALS — BP 165/61 | HR 72 | Temp 98.0°F

## 2014-10-12 DIAGNOSIS — E349 Endocrine disorder, unspecified: Secondary | ICD-10-CM

## 2014-10-12 DIAGNOSIS — E291 Testicular hypofunction: Secondary | ICD-10-CM | POA: Diagnosis present

## 2014-10-12 MED ORDER — TESTOSTERONE CYPIONATE 200 MG/ML IM SOLN
INTRAMUSCULAR | Status: AC
  Filled 2014-10-12: qty 1

## 2014-10-12 MED ORDER — TESTOSTERONE CYPIONATE 200 MG/ML IM SOLN
INTRAMUSCULAR | Status: AC
  Filled 2014-10-12: qty 2

## 2014-10-12 MED ORDER — TESTOSTERONE CYPIONATE 200 MG/ML IM SOLN
400.0000 mg | INTRAMUSCULAR | Status: DC
  Administered 2014-10-12: 400 mg via INTRAMUSCULAR

## 2014-10-12 NOTE — Patient Instructions (Signed)

## 2014-11-05 ENCOUNTER — Other Ambulatory Visit (HOSPITAL_BASED_OUTPATIENT_CLINIC_OR_DEPARTMENT_OTHER): Payer: Medicare Other

## 2014-11-05 ENCOUNTER — Ambulatory Visit (HOSPITAL_BASED_OUTPATIENT_CLINIC_OR_DEPARTMENT_OTHER): Payer: Medicare Other

## 2014-11-05 ENCOUNTER — Ambulatory Visit (HOSPITAL_BASED_OUTPATIENT_CLINIC_OR_DEPARTMENT_OTHER): Payer: Medicare Other | Admitting: Family

## 2014-11-05 ENCOUNTER — Encounter: Payer: Self-pay | Admitting: Hematology & Oncology

## 2014-11-05 VITALS — BP 140/57 | HR 65 | Temp 97.7°F | Resp 20 | Ht 67.0 in | Wt 179.0 lb

## 2014-11-05 DIAGNOSIS — E349 Endocrine disorder, unspecified: Secondary | ICD-10-CM

## 2014-11-05 DIAGNOSIS — C8587 Other specified types of non-Hodgkin lymphoma, spleen: Secondary | ICD-10-CM | POA: Diagnosis not present

## 2014-11-05 DIAGNOSIS — E291 Testicular hypofunction: Secondary | ICD-10-CM

## 2014-11-05 DIAGNOSIS — C8307 Small cell B-cell lymphoma, spleen: Secondary | ICD-10-CM

## 2014-11-05 LAB — CBC WITH DIFFERENTIAL (CANCER CENTER ONLY)
BASO#: 0 10*3/uL (ref 0.0–0.2)
BASO%: 0.7 % (ref 0.0–2.0)
EOS%: 5 % (ref 0.0–7.0)
Eosinophils Absolute: 0.3 10*3/uL (ref 0.0–0.5)
HEMATOCRIT: 32.3 % — AB (ref 38.7–49.9)
HGB: 11.2 g/dL — ABNORMAL LOW (ref 13.0–17.1)
LYMPH#: 1.4 10*3/uL (ref 0.9–3.3)
LYMPH%: 22.8 % (ref 14.0–48.0)
MCH: 35 pg — ABNORMAL HIGH (ref 28.0–33.4)
MCHC: 34.7 g/dL (ref 32.0–35.9)
MCV: 101 fL — AB (ref 82–98)
MONO#: 1 10*3/uL — ABNORMAL HIGH (ref 0.1–0.9)
MONO%: 16.9 % — AB (ref 0.0–13.0)
NEUT%: 54.6 % (ref 40.0–80.0)
NEUTROS ABS: 3.3 10*3/uL (ref 1.5–6.5)
Platelets: 219 10*3/uL (ref 145–400)
RBC: 3.2 10*6/uL — ABNORMAL LOW (ref 4.20–5.70)
RDW: 14.4 % (ref 11.1–15.7)
WBC: 6 10*3/uL (ref 4.0–10.0)

## 2014-11-05 LAB — COMPREHENSIVE METABOLIC PANEL
ALT: 14 U/L (ref 0–53)
AST: 22 U/L (ref 0–37)
Albumin: 3.8 g/dL (ref 3.5–5.2)
Alkaline Phosphatase: 76 U/L (ref 39–117)
BILIRUBIN TOTAL: 0.7 mg/dL (ref 0.2–1.2)
BUN: 48 mg/dL — AB (ref 6–23)
CO2: 22 mEq/L (ref 19–32)
CREATININE: 2.51 mg/dL — AB (ref 0.50–1.35)
Calcium: 9.1 mg/dL (ref 8.4–10.5)
Chloride: 102 mEq/L (ref 96–112)
GLUCOSE: 175 mg/dL — AB (ref 70–99)
Potassium: 4.6 mEq/L (ref 3.5–5.3)
Sodium: 136 mEq/L (ref 135–145)
Total Protein: 6.5 g/dL (ref 6.0–8.3)

## 2014-11-05 LAB — TESTOSTERONE: TESTOSTERONE: 232 ng/dL — AB (ref 300–890)

## 2014-11-05 MED ORDER — TESTOSTERONE CYPIONATE 200 MG/ML IM SOLN
400.0000 mg | INTRAMUSCULAR | Status: DC
  Administered 2014-11-05: 400 mg via INTRAMUSCULAR

## 2014-11-05 MED ORDER — TESTOSTERONE CYPIONATE 200 MG/ML IM SOLN
INTRAMUSCULAR | Status: AC
  Filled 2014-11-05: qty 2

## 2014-11-05 NOTE — Progress Notes (Signed)
Hematology and Oncology Follow Up Visit  ANDREW BLASIUS [id number] [birth year] 74 y.o. 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  2. Hypotestosteronemia.  3. Chronic renal insufficiency.  Current Therapy:   Testosterone 400 mg IM once a month    Interim History: Ms. Norrington is here today for a follow-up. He is feeling good and staying active. He has been doing a lot of yard work lately. He has had no problem with fatigue. He did get sunburned while outside. I suggested he start wearing sunscreen.  No headaches, dizziness, blurred vision, fever, chills, n/v, rash, cough, SOB, chest pain, palpitations, abdominal pain, changes in bowel or bladder habits. He has chronic swelling in his legs and takes lasix daily. No tenderness, numbness or tingling in his extremities.  He is eating healthy and staying hydrated. His weight is stable.    Medications:    Medication List       This list is accurate as of: 11/05/14 10:25 AM.  Always use your most recent med list.               allopurinol 100 MG tablet  Commonly known as:  ZYLOPRIM  Take 100 mg by mouth daily.     amLODipine 10 MG tablet  Commonly known as:  NORVASC  Take 10 mg by mouth daily.     doxazosin 4 MG tablet  Commonly known as:  CARDURA  Take 4 mg by mouth 2 (two) times daily. 2 tabs     febuxostat 40 MG tablet  Commonly known as:  ULORIC  Take 1 tablet (40 mg total) by mouth daily.     furosemide 40 MG tablet  Commonly known as:  LASIX  Take 40 mg by mouth daily.     levothyroxine 150 MCG tablet  Commonly known as:  SYNTHROID, LEVOTHROID  Take 150 mcg by mouth daily.     omeprazole 20 MG capsule  Commonly known as:  PRILOSEC  20 mg 2 (two) times daily.     sitaGLIPtin 50 MG tablet  Commonly known as:  JANUVIA  Take 50 mg by mouth daily.     triamcinolone cream 0.1 %  Commonly known as:  KENALOG  as needed.        Allergies:  Allergies    Allergen Reactions  . Iohexol     Pt states has history of reaction in the past, can not remember what exactly happened, but was told by the dr never to have the contrast again.    Past Medical History, Surgical history, Social history, and Family History were reviewed and updated.  Review of Systems: All other 10 point review of systems is negative.   Physical Exam:  height is 5\' 7"  (1.702 m) and weight is 179 lb (81.194 kg). His oral temperature is 97.7 F (36.5 C). His blood pressure is 140/57 and his pulse is 65. His respiration is 20.   Wt Readings from Last 3 Encounters:  11/05/14 179 lb (81.194 kg)  09/10/14 183 lb (83.008 kg)  08/14/14 180 lb (81.647 kg)    Ocular: Sclerae unicteric, pupils equal, round and reactive to light Ear-nose-throat: Oropharynx clear, dentition fair Lymphatic: No cervical or supraclavicular adenopathy Lungs no rales or rhonchi, good excursion bilaterally Heart regular rate and rhythm, no murmur appreciated Abd soft, nontender, positive bowel sounds MSK no focal spinal tenderness, no joint edema Neuro: non-focal, well-oriented, appropriate affect  Lab Results  Component Value Date  WBC 6.0 11/05/2014   HGB 11.2* 11/05/2014   HCT 32.3* 11/05/2014   MCV 101* 11/05/2014   PLT 219 11/05/2014   Lab Results  Component Value Date   FERRITIN 93 03/10/2014   IRON 88 03/10/2014   TIBC 253 03/10/2014   UIBC 164 03/10/2014   IRONPCTSAT 35 03/10/2014   Lab Results  Component Value Date   RETICCTPCT 0.9 03/29/2011   RBC 3.20* 11/05/2014   RETICCTABS 31.8 03/29/2011   No results found for: KPAFRELGTCHN, LAMBDASER, KAPLAMBRATIO No results found for: Kandis Cocking, [facility] Lab Results  Component Value Date   TOTALPROTELP 6.7 07/14/2011   ALBUMINELP 56.6 07/14/2011   A1GS 5.7* 07/14/2011   A2GS 12.7* 07/14/2011   BETS 4.9 07/14/2011   BETA2SER 3.8 07/14/2011   GAMS 16.3 07/14/2011   MSPIKE NOT DET 07/14/2011   SPEI * 07/14/2011      Chemistry      Component Value Date/Time   NA 138 07/16/2014 1036   NA 139 03/10/2014 0925   NA 141 11/13/2013 0933   K 5.0 07/16/2014 1036   K 4.4 03/10/2014 0925   K 3.9 11/13/2013 0933   CL 104 03/10/2014 0925   CL 101 11/13/2013 0933   CO2 23 07/16/2014 1036   CO2 25 03/10/2014 0925   CO2 26 11/13/2013 0933   BUN 44.7* 07/16/2014 1036   BUN 40* 03/10/2014 0925   BUN 34* 11/13/2013 0933   CREATININE 2.0* 07/16/2014 1036   CREATININE 1.71* 03/10/2014 0925   CREATININE 1.8* 11/13/2013 0933      Component Value Date/Time   CALCIUM 9.2 07/16/2014 1036   CALCIUM 9.6 03/10/2014 0925   CALCIUM 8.9 11/13/2013 0933   ALKPHOS 91 07/16/2014 1036   ALKPHOS 83 03/10/2014 0925   ALKPHOS 76 11/13/2013 0933   AST 19 07/16/2014 1036   AST 17 03/10/2014 0925   AST 23 11/13/2013 0933   ALT 16 07/16/2014 1036   ALT 12 03/10/2014 0925   ALT 10 11/13/2013 0933   BILITOT 0.78 07/16/2014 1036   BILITOT 0.6 03/10/2014 0925   BILITOT 0.60 11/13/2013 0933     Impression and Plan: Mr. Rosenboom is a very pleasant 74 year old gentleman with hypotestosteronemia. He also has a history of splenic marginal zone lymphoma. He had a splenectomy back in 2009 and completed 1 cycle of Rituxan in April 2010. He is asymptomatic at this time and so far there has been no evidence of recurrence.  He will continue to receive his testosterone injections monthly.  We will plan to see him back in 3 months for labs and follow-up. He knows to call here with any questions or concerns. We can certainly see him sooner if need be.   Eliezer Bottom, NP 7/7/201610:25 AM

## 2014-11-05 NOTE — Patient Instructions (Signed)

## 2014-12-14 ENCOUNTER — Ambulatory Visit (HOSPITAL_BASED_OUTPATIENT_CLINIC_OR_DEPARTMENT_OTHER): Payer: Medicare Other

## 2014-12-14 VITALS — BP 158/60 | HR 64 | Temp 97.8°F | Resp 18

## 2014-12-14 DIAGNOSIS — E349 Endocrine disorder, unspecified: Secondary | ICD-10-CM

## 2014-12-14 DIAGNOSIS — E291 Testicular hypofunction: Secondary | ICD-10-CM

## 2014-12-14 MED ORDER — TESTOSTERONE CYPIONATE 200 MG/ML IM SOLN
400.0000 mg | INTRAMUSCULAR | Status: DC
  Administered 2014-12-14: 400 mg via INTRAMUSCULAR

## 2014-12-14 MED ORDER — TESTOSTERONE CYPIONATE 200 MG/ML IM SOLN
INTRAMUSCULAR | Status: AC
  Filled 2014-12-14: qty 2

## 2014-12-14 NOTE — Patient Instructions (Signed)

## 2015-01-11 ENCOUNTER — Ambulatory Visit (HOSPITAL_BASED_OUTPATIENT_CLINIC_OR_DEPARTMENT_OTHER): Payer: Medicare Other

## 2015-01-11 VITALS — BP 161/61 | HR 61 | Temp 98.2°F | Resp 18

## 2015-01-11 DIAGNOSIS — Z23 Encounter for immunization: Secondary | ICD-10-CM | POA: Diagnosis not present

## 2015-01-11 DIAGNOSIS — E291 Testicular hypofunction: Secondary | ICD-10-CM

## 2015-01-11 DIAGNOSIS — E349 Endocrine disorder, unspecified: Secondary | ICD-10-CM

## 2015-01-11 MED ORDER — TESTOSTERONE CYPIONATE 200 MG/ML IM SOLN
INTRAMUSCULAR | Status: AC
  Filled 2015-01-11: qty 2

## 2015-01-11 MED ORDER — TESTOSTERONE CYPIONATE 200 MG/ML IM SOLN
400.0000 mg | INTRAMUSCULAR | Status: DC
  Administered 2015-01-11: 400 mg via INTRAMUSCULAR

## 2015-01-11 MED ORDER — INFLUENZA VAC SPLIT QUAD 0.5 ML IM SUSY
0.5000 mL | PREFILLED_SYRINGE | Freq: Once | INTRAMUSCULAR | Status: AC
  Administered 2015-01-11: 0.5 mL via INTRAMUSCULAR
  Filled 2015-01-11: qty 0.5

## 2015-01-11 NOTE — Patient Instructions (Signed)

## 2015-02-08 ENCOUNTER — Ambulatory Visit (HOSPITAL_BASED_OUTPATIENT_CLINIC_OR_DEPARTMENT_OTHER): Payer: Medicare Other

## 2015-02-08 ENCOUNTER — Other Ambulatory Visit (HOSPITAL_BASED_OUTPATIENT_CLINIC_OR_DEPARTMENT_OTHER): Payer: Medicare Other

## 2015-02-08 ENCOUNTER — Encounter: Payer: Self-pay | Admitting: Family

## 2015-02-08 ENCOUNTER — Ambulatory Visit (HOSPITAL_BASED_OUTPATIENT_CLINIC_OR_DEPARTMENT_OTHER): Payer: Medicare Other | Admitting: Family

## 2015-02-08 VITALS — BP 168/50 | HR 67 | Temp 97.4°F | Resp 14 | Ht 67.0 in | Wt 186.0 lb

## 2015-02-08 DIAGNOSIS — C8307 Small cell B-cell lymphoma, spleen: Secondary | ICD-10-CM

## 2015-02-08 DIAGNOSIS — E291 Testicular hypofunction: Secondary | ICD-10-CM

## 2015-02-08 DIAGNOSIS — Z8572 Personal history of non-Hodgkin lymphomas: Secondary | ICD-10-CM | POA: Diagnosis not present

## 2015-02-08 DIAGNOSIS — E349 Endocrine disorder, unspecified: Secondary | ICD-10-CM

## 2015-02-08 LAB — CBC WITH DIFFERENTIAL (CANCER CENTER ONLY)
BASO#: 0.1 10*3/uL (ref 0.0–0.2)
BASO%: 0.9 % (ref 0.0–2.0)
EOS%: 7.4 % — ABNORMAL HIGH (ref 0.0–7.0)
Eosinophils Absolute: 0.4 10*3/uL (ref 0.0–0.5)
HEMATOCRIT: 32.4 % — AB (ref 38.7–49.9)
HEMOGLOBIN: 11 g/dL — AB (ref 13.0–17.1)
LYMPH#: 1.3 10*3/uL (ref 0.9–3.3)
LYMPH%: 23 % (ref 14.0–48.0)
MCH: 34.9 pg — ABNORMAL HIGH (ref 28.0–33.4)
MCHC: 34 g/dL (ref 32.0–35.9)
MCV: 103 fL — ABNORMAL HIGH (ref 82–98)
MONO#: 0.9 10*3/uL (ref 0.1–0.9)
MONO%: 16.4 % — ABNORMAL HIGH (ref 0.0–13.0)
NEUT%: 52.3 % (ref 40.0–80.0)
NEUTROS ABS: 2.9 10*3/uL (ref 1.5–6.5)
Platelets: 209 10*3/uL (ref 145–400)
RBC: 3.15 10*6/uL — ABNORMAL LOW (ref 4.20–5.70)
RDW: 14.1 % (ref 11.1–15.7)
WBC: 5.4 10*3/uL (ref 4.0–10.0)

## 2015-02-08 LAB — COMPREHENSIVE METABOLIC PANEL (CC13)
ALT: 11 U/L (ref 0–55)
ANION GAP: 8 meq/L (ref 3–11)
AST: 17 U/L (ref 5–34)
Albumin: 3.8 g/dL (ref 3.5–5.0)
Alkaline Phosphatase: 81 U/L (ref 40–150)
BILIRUBIN TOTAL: 0.72 mg/dL (ref 0.20–1.20)
BUN: 37.6 mg/dL — ABNORMAL HIGH (ref 7.0–26.0)
CHLORIDE: 108 meq/L (ref 98–109)
CO2: 25 meq/L (ref 22–29)
CREATININE: 2 mg/dL — AB (ref 0.7–1.3)
Calcium: 9.1 mg/dL (ref 8.4–10.4)
EGFR: 33 mL/min/{1.73_m2} — ABNORMAL LOW (ref >90)
GLUCOSE: 189 mg/dL — AB (ref 70–140)
Potassium: 4.2 mEq/L (ref 3.5–5.1)
SODIUM: 141 meq/L (ref 136–145)
TOTAL PROTEIN: 6.7 g/dL (ref 6.4–8.3)

## 2015-02-08 MED ORDER — TESTOSTERONE CYPIONATE 200 MG/ML IM SOLN
400.0000 mg | INTRAMUSCULAR | Status: DC
  Administered 2015-02-08: 400 mg via INTRAMUSCULAR

## 2015-02-08 MED ORDER — TESTOSTERONE CYPIONATE 200 MG/ML IM SOLN
INTRAMUSCULAR | Status: AC
  Filled 2015-02-08: qty 2

## 2015-02-08 NOTE — Progress Notes (Signed)
Hematology and Oncology Follow Up Visit  Larry [name] [id number] 1940-09-01 74 y.o. 02/08/2015   Principle Diagnosis:  1. 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  2. Hypotestosteronemia.  3. Chronic renal insufficiency.  Current Therapy:   Testosterone 400 mg IM once a month    Interim History: Ms. Larry [name] is here today for a follow-up. He continues to do well. He denies fatigue and is staying active. He is dressed in all camo today and states that when he leaves here he is going to go home and work in his yard,  He has had no fever, chills, n/v, cough, rash, dizziness, blurred vision, headaches, SOB, chest pain, palpitations, abdominal pain or changes in bowel or bladder habits. He has not noticed any blood in his stool.  The chronic swelling in his legs has improved with lasix. He is also wearing compression stockings during the day as well. No numbness or tingling in his extremities.  He has a good appetite and is staying well hydrated. His weight is stable at this time.   Medications:    Medication List       This list is accurate as of: 02/08/15  9:27 AM.  Always use your most recent med list.               allopurinol 100 MG tablet  Commonly known as:  ZYLOPRIM  Take 100 mg by mouth daily.     amLODipine 10 MG tablet  Commonly known as:  NORVASC  Take 10 mg by mouth daily.     doxazosin 4 MG tablet  Commonly known as:  CARDURA  Take 4 mg by mouth 2 (two) times daily. 2 tabs     febuxostat 40 MG tablet  Commonly known as:  ULORIC  Take 1 tablet (40 mg total) by mouth daily.     furosemide 40 MG tablet  Commonly known as:  LASIX  Take 40 mg by mouth daily.     levothyroxine 150 MCG tablet  Commonly known as:  SYNTHROID, LEVOTHROID  Take 150 mcg by mouth daily.     omeprazole 20 MG capsule  Commonly known as:  PRILOSEC  20 mg 2 (two) times daily.     sitaGLIPtin 50 MG tablet  Commonly known as:  JANUVIA  Take 50 mg by mouth  daily.     triamcinolone cream 0.1 %  Commonly known as:  KENALOG  as needed.        Allergies:  Allergies  Allergen Reactions  . Iohexol     Pt states has history of reaction in the past, can not remember what exactly happened, but was told by the dr never to have the contrast again.    Past Medical History, Surgical history, Social history, and Family History were reviewed and updated.  Review of Systems: All other 10 point review of systems is negative.   Physical Exam:  height is 5\' 7"  (1.702 m) and weight is 186 lb (84.369 kg). His oral temperature is 97.4 F (36.3 C). His blood pressure is 168/50 and his pulse is 67. His respiration is 14.   Wt Readings from Last 3 Encounters:  02/08/15 186 lb (84.369 kg)  11/05/14 179 lb (81.194 kg)  09/10/14 183 lb (83.008 kg)    Ocular: Sclerae unicteric, pupils equal, round and reactive to light Ear-nose-throat: Oropharynx clear, dentition fair Lymphatic: No cervical or supraclavicular adenopathy Lungs no rales or rhonchi, good excursion bilaterally Heart regular rate and rhythm, no  murmur appreciated Abd soft, nontender, positive bowel sounds MSK no focal spinal tenderness, no joint edema Neuro: non-focal, well-oriented, appropriate affect  Lab Results  Component Value Date   WBC 5.4 02/08/2015   HGB 11.0* 02/08/2015   HCT 32.4* 02/08/2015   MCV 103* 02/08/2015   PLT 209 02/08/2015   Lab Results  Component Value Date   FERRITIN 93 03/10/2014   IRON 88 03/10/2014   TIBC 253 03/10/2014   UIBC 164 03/10/2014   IRONPCTSAT 35 03/10/2014   Lab Results  Component Value Date   RETICCTPCT 0.9 03/29/2011   RBC 3.15* 02/08/2015   RETICCTABS 31.8 03/29/2011   No results found for: KPAFRELGTCHN, LAMBDASER, KAPLAMBRATIO No results found for: Kandis Cocking, [facility] Lab Results  Component Value Date   TOTALPROTELP 6.7 07/14/2011   ALBUMINELP 56.6 07/14/2011   A1GS 5.7* 07/14/2011   A2GS 12.7* 07/14/2011   BETS 4.9  07/14/2011   BETA2SER 3.8 07/14/2011   GAMS 16.3 07/14/2011   MSPIKE NOT DET 07/14/2011   SPEI * 07/14/2011     Chemistry      Component Value Date/Time   NA 136 11/05/2014 0915   NA 138 07/16/2014 1036   NA 141 11/13/2013 0933   K 4.6 11/05/2014 0915   K 5.0 07/16/2014 1036   K 3.9 11/13/2013 0933   CL 102 11/05/2014 0915   CL 101 11/13/2013 0933   CO2 22 11/05/2014 0915   CO2 23 07/16/2014 1036   CO2 26 11/13/2013 0933   BUN 48* 11/05/2014 0915   BUN 44.7* 07/16/2014 1036   BUN 34* 11/13/2013 0933   CREATININE 2.51* 11/05/2014 0915   CREATININE 2.0* 07/16/2014 1036   CREATININE 1.8* 11/13/2013 0933      Component Value Date/Time   CALCIUM 9.1 11/05/2014 0915   CALCIUM 9.2 07/16/2014 1036   CALCIUM 8.9 11/13/2013 0933   ALKPHOS 76 11/05/2014 0915   ALKPHOS 91 07/16/2014 1036   ALKPHOS 76 11/13/2013 0933   AST 22 11/05/2014 0915   AST 19 07/16/2014 1036   AST 23 11/13/2013 0933   ALT 14 11/05/2014 0915   ALT 16 07/16/2014 1036   ALT 10 11/13/2013 0933   BILITOT 0.7 11/05/2014 0915   BILITOT 0.78 07/16/2014 1036   BILITOT 0.60 11/13/2013 0933     Impression and Plan: [name] is a very pleasant 74 yo male with hypotestosteronemia. He also has a history of splenic marginal zone lymphoma. He had a splenectomy in 2009 and completed 1 cycle of Rituxan in April 2010. So far there has been no evidence of recurrence.  He has responded nicely to testosterone injections and is asymptomatic at this time. He will continue to receive these monthly.  We will plan to see him back in 3 months for labs and follow-up. He knows to contact us with any questions or concerns. We can certainly see him sooner if need be.   Eliezer Bottom, NP 10/10/20169:27 AM

## 2015-02-08 NOTE — Patient Instructions (Signed)

## 2015-02-09 LAB — TESTOSTERONE: Testosterone: 209 ng/dL — ABNORMAL LOW (ref 300–890)

## 2015-03-11 ENCOUNTER — Ambulatory Visit (HOSPITAL_BASED_OUTPATIENT_CLINIC_OR_DEPARTMENT_OTHER): Payer: Medicare Other

## 2015-03-11 VITALS — BP 172/61 | HR 73 | Temp 98.3°F | Resp 16

## 2015-03-11 DIAGNOSIS — E291 Testicular hypofunction: Secondary | ICD-10-CM

## 2015-03-11 DIAGNOSIS — E349 Endocrine disorder, unspecified: Secondary | ICD-10-CM

## 2015-03-11 MED ORDER — TESTOSTERONE CYPIONATE 200 MG/ML IM SOLN
INTRAMUSCULAR | Status: AC
  Filled 2015-03-11: qty 2

## 2015-03-11 MED ORDER — TESTOSTERONE CYPIONATE 200 MG/ML IM SOLN
400.0000 mg | INTRAMUSCULAR | Status: DC
  Administered 2015-03-11: 400 mg via INTRAMUSCULAR

## 2015-03-11 NOTE — Patient Instructions (Signed)

## 2015-04-02 DIAGNOSIS — E119 Type 2 diabetes mellitus without complications: Secondary | ICD-10-CM | POA: Diagnosis not present

## 2015-04-02 DIAGNOSIS — M1990 Unspecified osteoarthritis, unspecified site: Secondary | ICD-10-CM | POA: Diagnosis not present

## 2015-04-02 DIAGNOSIS — J209 Acute bronchitis, unspecified: Secondary | ICD-10-CM | POA: Diagnosis not present

## 2015-04-02 DIAGNOSIS — I10 Essential (primary) hypertension: Secondary | ICD-10-CM | POA: Diagnosis not present

## 2015-04-05 DIAGNOSIS — I10 Essential (primary) hypertension: Secondary | ICD-10-CM | POA: Diagnosis not present

## 2015-04-05 DIAGNOSIS — J209 Acute bronchitis, unspecified: Secondary | ICD-10-CM | POA: Diagnosis not present

## 2015-04-07 ENCOUNTER — Emergency Department (HOSPITAL_COMMUNITY): Payer: Medicare Other

## 2015-04-07 ENCOUNTER — Emergency Department (HOSPITAL_COMMUNITY)
Admission: EM | Admit: 2015-04-07 | Discharge: 2015-04-07 | Disposition: A | Discharge status: Home / Self Care | Payer: Medicare Other | Attending: Emergency Medicine | Admitting: Emergency Medicine

## 2015-04-07 ENCOUNTER — Encounter (HOSPITAL_COMMUNITY): Payer: Self-pay | Admitting: *Deleted

## 2015-04-07 DIAGNOSIS — Z8572 Personal history of non-Hodgkin lymphomas: Secondary | ICD-10-CM | POA: Diagnosis not present

## 2015-04-07 DIAGNOSIS — R062 Wheezing: Secondary | ICD-10-CM | POA: Diagnosis not present

## 2015-04-07 DIAGNOSIS — Z79899 Other long term (current) drug therapy: Secondary | ICD-10-CM | POA: Insufficient documentation

## 2015-04-07 DIAGNOSIS — I10 Essential (primary) hypertension: Secondary | ICD-10-CM | POA: Diagnosis not present

## 2015-04-07 DIAGNOSIS — R5383 Other fatigue: Secondary | ICD-10-CM | POA: Diagnosis not present

## 2015-04-07 DIAGNOSIS — E119 Type 2 diabetes mellitus without complications: Secondary | ICD-10-CM | POA: Diagnosis not present

## 2015-04-07 DIAGNOSIS — J069 Acute upper respiratory infection, unspecified: Secondary | ICD-10-CM

## 2015-04-07 DIAGNOSIS — R0602 Shortness of breath: Secondary | ICD-10-CM | POA: Diagnosis not present

## 2015-04-07 DIAGNOSIS — Z8719 Personal history of other diseases of the digestive system: Secondary | ICD-10-CM | POA: Diagnosis not present

## 2015-04-07 DIAGNOSIS — E079 Disorder of thyroid, unspecified: Secondary | ICD-10-CM | POA: Diagnosis not present

## 2015-04-07 DIAGNOSIS — R05 Cough: Secondary | ICD-10-CM | POA: Diagnosis present

## 2015-04-07 LAB — BASIC METABOLIC PANEL
Anion gap: 9 (ref 5–15)
BUN: 44 mg/dL — AB (ref 6–20)
CHLORIDE: 106 mmol/L (ref 101–111)
CO2: 25 mmol/L (ref 22–32)
CREATININE: 2.37 mg/dL — AB (ref 0.61–1.24)
Calcium: 9.6 mg/dL (ref 8.9–10.3)
GFR calc Af Amer: 29 mL/min — ABNORMAL LOW (ref >60)
GFR calc non Af Amer: 25 mL/min — ABNORMAL LOW (ref >60)
Glucose, Bld: 142 mg/dL — ABNORMAL HIGH (ref 65–99)
Potassium: 4.5 mmol/L (ref 3.5–5.1)
SODIUM: 140 mmol/L (ref 135–145)

## 2015-04-07 LAB — CBC WITH DIFFERENTIAL/PLATELET
Basophils Absolute: 0.1 10*3/uL (ref 0.0–0.1)
Basophils Relative: 1 %
EOS ABS: 0.3 10*3/uL (ref 0.0–0.7)
EOS PCT: 3 %
HCT: 34 % — ABNORMAL LOW (ref 39.0–52.0)
Hemoglobin: 11.7 g/dL — ABNORMAL LOW (ref 13.0–17.0)
LYMPHS ABS: 1.4 10*3/uL (ref 0.7–4.0)
Lymphocytes Relative: 13 %
MCH: 34.8 pg — AB (ref 26.0–34.0)
MCHC: 34.4 g/dL (ref 30.0–36.0)
MCV: 101.2 fL — ABNORMAL HIGH (ref 78.0–100.0)
MONOS PCT: 11 %
Monocytes Absolute: 1.2 10*3/uL — ABNORMAL HIGH (ref 0.1–1.0)
Neutro Abs: 7.7 10*3/uL (ref 1.7–7.7)
Neutrophils Relative %: 72 %
Platelets: 203 10*3/uL (ref 150–400)
RBC: 3.36 MIL/uL — AB (ref 4.22–5.81)
RDW: 14.6 % (ref 11.5–15.5)
WBC: 10.6 10*3/uL — AB (ref 4.0–10.5)

## 2015-04-07 LAB — I-STAT CG4 LACTIC ACID, ED: Lactic Acid, Venous: 0.8 mmol/L (ref 0.5–2.0)

## 2015-04-07 MED ORDER — IPRATROPIUM-ALBUTEROL 0.5-2.5 (3) MG/3ML IN SOLN
3.0000 mL | Freq: Once | RESPIRATORY_TRACT | Status: AC
  Administered 2015-04-07: 3 mL via RESPIRATORY_TRACT
  Filled 2015-04-07: qty 3

## 2015-04-07 MED ORDER — ALBUTEROL SULFATE HFA 108 (90 BASE) MCG/ACT IN AERS
2.0000 | INHALATION_SPRAY | Freq: Once | RESPIRATORY_TRACT | Status: AC
  Administered 2015-04-07: 2 via RESPIRATORY_TRACT
  Filled 2015-04-07: qty 6.7

## 2015-04-07 NOTE — ED Provider Notes (Signed)
CSN: [id number]     Arrival date & time 04/07/15  [id number] History   First MD Initiated Contact with Patient 04/07/15 1002     Chief Complaint  Patient presents with  . URI     (Consider location/radiation/quality/duration/timing/severity/associated sxs/prior Treatment) HPI   Larry [name] is a 74 y.o M with a pmhx of non-Hodgkin's lymphoma in remission, DM, HTN who presents the emergency department complaining of productive cough and fatigue. Patient states he has been experiencing these symptoms over the last 3 weeks which include productive cough with yellow/green sputum, fatigue, chills and subjective fevers. Patient was seen in his primary care office last Friday and was given Levaquin and an unknown injection. Patient returned his primary care office on Monday, 2 days ago and received an additional unknown injection, possibly steroids. Patient states that he has not improved symptomatically and was told to come to the emergency department by his PCP. Denies chest pain, dizziness, syncope, N/V/D, paresthesias.   Past Medical History  Diagnosis Date  . Cancer ([place])     Non Hodgkins Lymphoma  . Hernia 1949  . Diabetes mellitus 5-6 yrs    T2DM  . Hypertension   . Hyperkalemia   . 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 Hypotestosteronism 03/29/2011  . Thyroid disease    Past Surgical History  Procedure Laterality Date  . Splenectomy, total  2009  . Appendectomy  1948  . Pancreatectomy  2009    Partial w/ splenectomy  . Tonsillectomy    . Hernia repair  1949    unknown what type of hernia  . Laparotomy  08/11/2011    Procedure: EXPLORATORY LAPAROTOMY;  Surgeon: Zenovia Jarred, MD;  Location: [facility];  Service: General;  Laterality: N/A;   No family history on file. Social History  Substance Use Topics  . Smoking status: Never Smoker   . Smokeless tobacco: Never Used     Comment: Never used tobacco  . Alcohol Use: No    Review of Systems  All other  systems reviewed and are negative.     Allergies  Iohexol  Home Medications   Prior to Admission medications   Medication Sig Start Date End Date Taking? Authorizing Provider  allopurinol (ZYLOPRIM) 100 MG tablet Take 100 mg by mouth daily.  10/20/14   Historical Provider, MD  amLODipine (NORVASC) 10 MG tablet Take 10 mg by mouth daily.      Historical Provider, MD  doxazosin (CARDURA) 4 MG tablet Take 4 mg by mouth 2 (two) times daily. 2 tabs 10/20/14   Historical Provider, MD  febuxostat (ULORIC) 40 MG tablet Take 1 tablet (40 mg total) by mouth daily. 07/15/13   Volanda Napoleon, MD  furosemide (LASIX) 40 MG tablet Take 40 mg by mouth daily.     Historical Provider, MD  levothyroxine (SYNTHROID, LEVOTHROID) 150 MCG tablet Take 150 mcg by mouth daily.      Historical Provider, MD  omeprazole (PRILOSEC) 20 MG capsule 20 mg 2 (two) times daily.  11/27/11   Historical Provider, MD  sitaGLIPtin (JANUVIA) 50 MG tablet Take 50 mg by mouth daily.    Historical Provider, MD  triamcinolone cream (KENALOG) 0.1 % as needed.  09/18/11   Historical Provider, MD   BP 153/86 mmHg  Pulse 95  Temp(Src) 97.3 F (36.3 C) (Oral)  Resp 18  Ht 5\' 8"  (1.727 m)  Wt 85.73 kg  BMI 28.74 kg/m2  SpO2 95% Physical Exam  Constitutional: He is oriented  to person, place, and time. He appears well-developed and well-nourished. No distress.  HENT:  Head: Normocephalic and atraumatic.  Right Ear: External ear normal.  Left Ear: External ear normal.  Nose: Nose normal.  Mouth/Throat: Oropharynx is clear and moist. No oropharyngeal exudate.  Eyes: Conjunctivae and EOM are normal. Pupils are equal, round, and reactive to light. Right eye exhibits no discharge. Left eye exhibits no discharge. No scleral icterus.  Neck: Neck supple.  Cardiovascular: Normal rate, regular rhythm, normal heart sounds and intact distal pulses.  Exam reveals no gallop and no friction rub.   No murmur heard. Pulmonary/Chest: Effort normal.  No respiratory distress. He has wheezes ( wheezing appreciated in bilateral lung bases). He has no rales. He exhibits no tenderness.  Abdominal: Soft. Bowel sounds are normal. He exhibits no distension. There is no tenderness. There is no guarding.  Musculoskeletal: Normal range of motion. He exhibits no edema.  Lymphadenopathy:    He has no cervical adenopathy.  Neurological: He is alert and oriented to person, place, and time. No cranial nerve deficit.  Strength 5/5 throughout. No sensory deficits.  No gait abnormality.  Skin: Skin is warm and dry. No rash noted. He is not diaphoretic. No erythema. No pallor.  Psychiatric: He has a normal mood and affect. His behavior is normal.  Nursing note and vitals reviewed.   ED Course  Procedures (including critical care time) Labs Review Labs Reviewed  BASIC METABOLIC PANEL - Abnormal; Notable for the following:    Glucose, Bld 142 (*)    BUN 44 (*)    Creatinine, Ser 2.37 (*)    GFR calc non Af Amer 25 (*)    GFR calc Af Amer 29 (*)    All other components within normal limits  CBC WITH DIFFERENTIAL/PLATELET - Abnormal; Notable for the following:    WBC 10.6 (*)    RBC 3.36 (*)    Hemoglobin 11.7 (*)    HCT 34.0 (*)    MCV 101.2 (*)    MCH 34.8 (*)    Monocytes Absolute 1.2 (*)    All other components within normal limits  I-STAT CG4 LACTIC ACID, ED    Imaging Review Dg Chest 2 View  04/07/2015  CLINICAL DATA:  Productive cough.  Shortness of breath. EXAM: CHEST  2 VIEW COMPARISON:  Chest CT 06/01/2010 FINDINGS: Heart and mediastinal contours are within normal limits. No focal opacities or effusions. No acute bony abnormality. IMPRESSION: No active cardiopulmonary disease. Electronically Signed   By: Rolm Baptise M.D.   On: 04/07/2015 10:25   I have personally reviewed and evaluated these images and lab results as part of my medical decision-making.   EKG Interpretation None      MDM   Final diagnoses:  URI (upper  respiratory infection)    74 y.o M presents with URI symptoms. Patient has been seen and evaluated by his primary care office and been given Levaquin that she has been taken for the last 5 days. Patient states that he has not improved symptomatically with this. Patient still experiencing productive cough and intermittent wheezing. Patient is afebrile in the ED. Appears well, nontoxic. Wheezing appreciated in bilateral lung bases patient given 1 DuoNeb. Patient reports significant symptomatic improvement after intervention. Chest x-ray negative for infection. No leukocytosis. Lactic acid wnl. Patient was ambulated in the ED and his pulse ox remained above 95%. Suspect viral etiology for symptoms. Likely bronchitis. We will discharge home with albuterol inhaler and encourage patient to  complete his course of antibiotics that was given to him by his PCP. Discussed treatment plan with patient who is agreeable. Return precautions outlined in patient discharge instructions.  Patient was discussed with and seen by Dr. Christy Gentles who agrees with the treatment plan.      Dondra Spry Steele, PA-C 04/07/15 1751  Ripley Fraise, MD 04/08/15 [phone]

## 2015-04-07 NOTE — ED Notes (Signed)
Pt c/o  Productive cough with yellow sputum, pt seen pcp last wk for similar symptoms, pt rcved unknown inj last wk at PCP office, pt has been taking Levoquin PO once daily, pt A&O x4, denies SOB, n/v/d, pt reports generalized weakness

## 2015-04-07 NOTE — ED Provider Notes (Addendum)
Patient seen/examined in the Emergency Department in conjunction with Midlevel Provider  Patient reports cough/SOB/wheezing Exam : awake/alert, minimal wheeze noted (after nebs) no distress noted Plan: will ambulate and if no hypoxia, will d/c home   Ripley Fraise, MD 04/07/15 [place], MD 04/07/15 1155

## 2015-04-07 NOTE — ED Notes (Signed)
Pt instructed on use of inhaler.

## 2015-04-07 NOTE — Discharge Instructions (Signed)
Cough, Adult A cough helps to clear your throat and lungs. A cough may last only 2-3 weeks (acute), or it may last longer than 8 weeks (chronic). Many different things can cause a cough. A cough may be a sign of an illness or another medical condition. HOME CARE  Pay attention to any changes in your cough.  Take medicines only as told by your doctor.  If you were prescribed an antibiotic medicine, take it as told by your doctor. Do not stop taking it even if you start to feel better.  Talk with your doctor before you try using a cough medicine.  Drink enough fluid to keep your pee (urine) clear or pale yellow.  If the air is dry, use a cold steam vaporizer or humidifier in your home.  Stay away from things that make you cough at work or at home.  If your cough is worse at night, try using extra pillows to raise your head up higher while you sleep.  Do not smoke, and try not to be around smoke. If you need help quitting, ask your doctor.  Do not have caffeine.  Do not drink alcohol.  Rest as needed. GET HELP IF:  You have new problems (symptoms).  You cough up yellow fluid (pus).  Your cough does not get better after 2-3 weeks, or your cough gets worse.  Medicine does not help your cough and you are not sleeping well.  You have pain that gets worse or pain that is not helped with medicine.  You have a fever.  You are losing weight and you do not know why.  You have night sweats. GET HELP RIGHT AWAY IF:  You cough up blood.  You have trouble breathing.  Your heartbeat is very fast.   This information is not intended to replace advice given to you by your health care provider. Make sure you discuss any questions you have with your health care provider.   Document Released: 12/29/2010 Document Revised: 01/06/2015 Document Reviewed: 06/24/2014 Elsevier Interactive Patient Education 2016 [employer]  Upper Respiratory Infection, Adult Most upper respiratory  infections (URIs) are a viral infection of the air passages leading to the lungs. A URI affects the nose, throat, and upper air passages. The most common type of URI is nasopharyngitis and is typically referred to as "the common cold." URIs run their course and usually go away on their own. Most of the time, a URI does not require medical attention, but sometimes a bacterial infection in the upper airways can follow a viral infection. This is called a secondary infection. Sinus and middle ear infections are common types of secondary upper respiratory infections. Bacterial pneumonia can also complicate a URI. A URI can worsen asthma and chronic obstructive pulmonary disease (COPD). Sometimes, these complications can require emergency medical care and may be life threatening.  CAUSES Almost all URIs are caused by viruses. A virus is a type of germ and can spread from one person to another.  RISKS FACTORS You may be at risk for a URI if:   You smoke.   You have chronic heart or lung disease.  You have a weakened defense (immune) system.   You are very young or very old.   You have nasal allergies or asthma.  You work in crowded or poorly ventilated areas.  You work in health care facilities or schools. SIGNS AND SYMPTOMS  Symptoms typically develop 2-3 days after you come in contact with a cold virus.  Most viral URIs last 7-10 days. However, viral URIs from the influenza virus (flu virus) can last 14-18 days and are typically more severe. Symptoms may include:   Runny or stuffy (congested) nose.   Sneezing.   Cough.   Sore throat.   Headache.   Fatigue.   Fever.   Loss of appetite.   Pain in your forehead, behind your eyes, and over your cheekbones (sinus pain).  Muscle aches.  DIAGNOSIS  Your health care provider may diagnose a URI by:  Physical exam.  Tests to check that your symptoms are not due to another condition such as:  Strep  throat.  Sinusitis.  Pneumonia.  Asthma. TREATMENT  A URI goes away on its own with time. It cannot be cured with medicines, but medicines may be prescribed or recommended to relieve symptoms. Medicines may help:  Reduce your fever.  Reduce your cough.  Relieve nasal congestion. HOME CARE INSTRUCTIONS   Take medicines only as directed by your health care provider.   Gargle warm saltwater or take cough drops to comfort your throat as directed by your health care provider.  Use a warm mist humidifier or inhale steam from a shower to increase air moisture. This may make it easier to breathe.  Drink enough fluid to keep your urine clear or pale yellow.   Eat soups and other clear broths and maintain good nutrition.   Rest as needed.   Return to work when your temperature has returned to normal or as your health care provider advises. You may need to stay home longer to avoid infecting others. You can also use a face mask and careful hand washing to prevent spread of the virus.  Increase the usage of your inhaler if you have asthma.   Do not use any tobacco products, including cigarettes, chewing tobacco, or electronic cigarettes. If you need help quitting, ask your health care provider. PREVENTION  The best way to protect yourself from getting a cold is to practice good hygiene.   Avoid oral or hand contact with people with cold symptoms.   Wash your hands often if contact occurs.  There is no clear evidence that vitamin C, vitamin E, echinacea, or exercise reduces the chance of developing a cold. However, it is always recommended to get plenty of rest, exercise, and practice good nutrition.  SEEK MEDICAL CARE IF:   You are getting worse rather than better.   Your symptoms are not controlled by medicine.   You have chills.  You have worsening shortness of breath.  You have brown or red mucus.  You have yellow or brown nasal discharge.  You have pain in your  face, especially when you bend forward.  You have a fever.  You have swollen neck glands.  You have pain while swallowing.  You have white areas in the back of your throat. SEEK IMMEDIATE MEDICAL CARE IF:   You have severe or persistent:  Headache.  Ear pain.  Sinus pain.  Chest pain.  You have chronic lung disease and any of the following:  Wheezing.  Prolonged cough.  Coughing up blood.  A change in your usual mucus.  You have a stiff neck.  You have changes in your:  Vision.  Hearing.  Thinking.  Mood. MAKE SURE YOU:   Understand these instructions.  Will watch your condition.  Will get help right away if you are not doing well or get worse.   This information is not intended to replace  advice given to you by your health care provider. Make sure you discuss any questions you have with your health care provider.  Follow up with your primary care provider if symptoms aren't improved. Use albuterol inhaler as needed for wheezing or shortness of breath. Return to the emergency department if you expands worsening of your symptoms, fever, vomiting, chest pain or difficulty breathing.

## 2015-04-22 DIAGNOSIS — I10 Essential (primary) hypertension: Secondary | ICD-10-CM | POA: Diagnosis not present

## 2015-04-22 DIAGNOSIS — N183 Chronic kidney disease, stage 3 (moderate): Secondary | ICD-10-CM | POA: Diagnosis not present

## 2015-04-23 ENCOUNTER — Ambulatory Visit (HOSPITAL_BASED_OUTPATIENT_CLINIC_OR_DEPARTMENT_OTHER): Payer: Medicare Other

## 2015-04-23 VITALS — BP 143/62 | HR 75 | Temp 98.3°F | Resp 18

## 2015-04-23 DIAGNOSIS — E349 Endocrine disorder, unspecified: Secondary | ICD-10-CM

## 2015-04-23 DIAGNOSIS — E291 Testicular hypofunction: Secondary | ICD-10-CM | POA: Diagnosis present

## 2015-04-23 MED ORDER — TESTOSTERONE CYPIONATE 200 MG/ML IM SOLN
INTRAMUSCULAR | Status: AC
  Filled 2015-04-23: qty 2

## 2015-04-23 MED ORDER — TESTOSTERONE CYPIONATE 200 MG/ML IM SOLN
400.0000 mg | INTRAMUSCULAR | Status: DC
  Administered 2015-04-23: 400 mg via INTRAMUSCULAR

## 2015-04-23 NOTE — Patient Instructions (Signed)

## 2015-05-11 ENCOUNTER — Ambulatory Visit: Payer: Medicare Other

## 2015-05-11 ENCOUNTER — Other Ambulatory Visit: Payer: Medicare Other

## 2015-05-11 ENCOUNTER — Ambulatory Visit: Payer: Medicare Other | Admitting: Hematology & Oncology

## 2015-05-18 ENCOUNTER — Ambulatory Visit (HOSPITAL_BASED_OUTPATIENT_CLINIC_OR_DEPARTMENT_OTHER): Payer: Medicare Other

## 2015-05-18 ENCOUNTER — Other Ambulatory Visit (HOSPITAL_BASED_OUTPATIENT_CLINIC_OR_DEPARTMENT_OTHER): Payer: Medicare Other

## 2015-05-18 ENCOUNTER — Encounter: Payer: Self-pay | Admitting: Hematology & Oncology

## 2015-05-18 ENCOUNTER — Ambulatory Visit (HOSPITAL_BASED_OUTPATIENT_CLINIC_OR_DEPARTMENT_OTHER): Payer: Medicare Other | Admitting: Family

## 2015-05-18 VITALS — BP 167/58 | HR 70 | Temp 97.8°F | Resp 16 | Ht 68.0 in | Wt 187.0 lb

## 2015-05-18 DIAGNOSIS — N189 Chronic kidney disease, unspecified: Secondary | ICD-10-CM | POA: Diagnosis not present

## 2015-05-18 DIAGNOSIS — E349 Endocrine disorder, unspecified: Secondary | ICD-10-CM

## 2015-05-18 DIAGNOSIS — C8307 Small cell B-cell lymphoma, spleen: Secondary | ICD-10-CM | POA: Diagnosis not present

## 2015-05-18 DIAGNOSIS — Z8579 Personal history of other malignant neoplasms of lymphoid, hematopoietic and related tissues: Secondary | ICD-10-CM

## 2015-05-18 DIAGNOSIS — E291 Testicular hypofunction: Secondary | ICD-10-CM

## 2015-05-18 LAB — CBC WITH DIFFERENTIAL (CANCER CENTER ONLY)
BASO#: 0.1 10*3/uL (ref 0.0–0.2)
BASO%: 0.6 % (ref 0.0–2.0)
EOS ABS: 0.6 10*3/uL — AB (ref 0.0–0.5)
EOS%: 7.3 % — ABNORMAL HIGH (ref 0.0–7.0)
HCT: 32 % — ABNORMAL LOW (ref 38.7–49.9)
HEMOGLOBIN: 10.8 g/dL — AB (ref 13.0–17.1)
LYMPH#: 1.4 10*3/uL (ref 0.9–3.3)
LYMPH%: 16.4 % (ref 14.0–48.0)
MCH: 34.7 pg — AB (ref 28.0–33.4)
MCHC: 33.8 g/dL (ref 32.0–35.9)
MCV: 103 fL — AB (ref 82–98)
MONO#: 1.3 10*3/uL — AB (ref 0.1–0.9)
MONO%: 15.5 % — ABNORMAL HIGH (ref 0.0–13.0)
NEUT%: 60.2 % (ref 40.0–80.0)
NEUTROS ABS: 5.1 10*3/uL (ref 1.5–6.5)
Platelets: 201 10*3/uL (ref 145–400)
RBC: 3.11 10*6/uL — AB (ref 4.20–5.70)
RDW: 14.9 % (ref 11.1–15.7)
WBC: 8.4 10*3/uL (ref 4.0–10.0)

## 2015-05-18 LAB — COMPREHENSIVE METABOLIC PANEL
ALBUMIN: 3.5 g/dL (ref 3.5–5.0)
ALK PHOS: 93 U/L (ref 40–150)
ALT: 13 U/L (ref 0–55)
ANION GAP: 8 meq/L (ref 3–11)
AST: 17 U/L (ref 5–34)
BILIRUBIN TOTAL: 0.75 mg/dL (ref 0.20–1.20)
BUN: 35.5 mg/dL — ABNORMAL HIGH (ref 7.0–26.0)
CALCIUM: 8.8 mg/dL (ref 8.4–10.4)
CO2: 25 mEq/L (ref 22–29)
Chloride: 107 mEq/L (ref 98–109)
Creatinine: 2.3 mg/dL — ABNORMAL HIGH (ref 0.7–1.3)
EGFR: 27 mL/min/{1.73_m2} — AB (ref >90)
Glucose: 182 mg/dl — ABNORMAL HIGH (ref 70–140)
POTASSIUM: 4.5 meq/L (ref 3.5–5.1)
Sodium: 141 mEq/L (ref 136–145)
TOTAL PROTEIN: 6.6 g/dL (ref 6.4–8.3)

## 2015-05-18 MED ORDER — TESTOSTERONE CYPIONATE 200 MG/ML IM SOLN
400.0000 mg | INTRAMUSCULAR | Status: DC
  Administered 2015-05-18: 400 mg via INTRAMUSCULAR

## 2015-05-18 MED ORDER — TESTOSTERONE CYPIONATE 200 MG/ML IM SOLN
INTRAMUSCULAR | Status: AC
  Filled 2015-05-18: qty 2

## 2015-05-18 NOTE — Progress Notes (Signed)
Hematology and Oncology Follow Up Visit  Larry [name] [id number] 02/19/1941 75 y.o. 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  2. Hypotestosteronemia.  3. Chronic renal insufficiency.  Current Therapy:   Testosterone 400 mg IM once a month    Interim History: Ms. Daffern is here today for a follow-up. He is staying active and doing quite well. He had a wonderful Christmas with his family and even stopped by our office with his wife dressed like [employer] for our patients.  He denies fatigue and states that he is able to get work done around the house with no problems.  No fever, chills, n/v, cough, rash, dizziness, headache, blurred vision, SOB, chest pain, palpitations, abdominal pain or changes in bowel or bladder habits.  The swelling in his lower extremities is down today. He takes lasix daily. No tenderness, numbness or tingling in his extremities.  He states that his blood sugars have been well controlled. He is eating well and staying hydrated. His weight is unchanged.    Medications:    Medication List       This list is accurate as of: 05/18/15 11:12 AM.  Always use your most recent med list.               allopurinol 100 MG tablet  Commonly known as:  ZYLOPRIM  Take 100 mg by mouth daily.     amLODipine 10 MG tablet  Commonly known as:  NORVASC  Take 10 mg by mouth daily.     doxazosin 4 MG tablet  Commonly known as:  CARDURA  Take 4 mg by mouth 2 (two) times daily. 2 tabs     febuxostat 40 MG tablet  Commonly known as:  ULORIC  Take 1 tablet (40 mg total) by mouth daily.     furosemide 40 MG tablet  Commonly known as:  LASIX  Take 40 mg by mouth daily.     levothyroxine 150 MCG tablet  Commonly known as:  SYNTHROID, LEVOTHROID  Take 150 mcg by mouth daily.     omeprazole 20 MG capsule  Commonly known as:  PRILOSEC  20 mg 2 (two) times daily.     sitaGLIPtin 50 MG tablet  Commonly  known as:  JANUVIA  Take 50 mg by mouth daily.     triamcinolone ointment 0.1 %  Commonly known as:  KENALOG        Allergies:  Allergies  Allergen Reactions  . Iohexol     Pt states has history of reaction in the past, can not remember what exactly happened, but was told by the dr never to have the contrast again.    Past Medical History, Surgical history, Social history, and Family History were reviewed and updated.  Review of Systems: All other 10 point review of systems is negative.   Physical Exam:  height is 5\' 8"  (1.727 m) and weight is 187 lb (84.823 kg). His oral temperature is 97.8 F (36.6 C). His blood pressure is 167/58 and his pulse is 70. His respiration is 16.   Wt Readings from Last 3 Encounters:  05/18/15 187 lb (84.823 kg)  04/07/15 189 lb (85.73 kg)  02/08/15 186 lb (84.369 kg)    Ocular: Sclerae unicteric, pupils equal, round and reactive to light Ear-nose-throat: Oropharynx clear, dentition fair Lymphatic: No cervical supraclavicular or axillary adenopathy Lungs no rales or rhonchi, good excursion bilaterally Heart regular rate and rhythm, no murmur appreciated  Abd soft, nontender, positive bowel sounds, no liver tip palpated on exam MSK no focal spinal tenderness, no joint edema Neuro: non-focal, well-oriented, appropriate affect  Lab Results  Component Value Date   WBC 8.4 05/18/2015   HGB 10.8* 05/18/2015   HCT 32.0* 05/18/2015   MCV 103* 05/18/2015   PLT 201 05/18/2015   Lab Results  Component Value Date   FERRITIN 93 03/10/2014   IRON 88 03/10/2014   TIBC 253 03/10/2014   UIBC 164 03/10/2014   IRONPCTSAT 35 03/10/2014   Lab Results  Component Value Date   RETICCTPCT 0.9 03/29/2011   RBC 3.11* 05/18/2015   RETICCTABS 31.8 03/29/2011   No results found for: KPAFRELGTCHN, LAMBDASER, KAPLAMBRATIO No results found for: Kandis Cocking, IGMSERUM Lab Results  Component Value Date   TOTALPROTELP 6.7 07/14/2011   ALBUMINELP 56.6  07/14/2011   A1GS 5.7* 07/14/2011   A2GS 12.7* 07/14/2011   BETS 4.9 07/14/2011   BETA2SER 3.8 07/14/2011   GAMS 16.3 07/14/2011   MSPIKE NOT DET 07/14/2011   SPEI * 07/14/2011     Chemistry      Component Value Date/Time   NA 140 04/07/2015 1054   NA 141 02/08/2015 0757   NA 141 11/13/2013 0933   K 4.5 04/07/2015 1054   K 4.2 02/08/2015 0757   K 3.9 11/13/2013 0933   CL 106 04/07/2015 1054   CL 101 11/13/2013 0933   CO2 25 04/07/2015 1054   CO2 25 02/08/2015 0757   CO2 26 11/13/2013 0933   BUN 44* 04/07/2015 1054   BUN 37.6* 02/08/2015 0757   BUN 34* 11/13/2013 0933   CREATININE 2.37* 04/07/2015 1054   CREATININE 2.0* 02/08/2015 0757   CREATININE 1.8* 11/13/2013 0933      Component Value Date/Time   CALCIUM 9.6 04/07/2015 1054   CALCIUM 9.1 02/08/2015 0757   CALCIUM 8.9 11/13/2013 0933   ALKPHOS 81 02/08/2015 0757   ALKPHOS 76 11/05/2014 0915   ALKPHOS 76 11/13/2013 0933   AST 17 02/08/2015 0757   AST 22 11/05/2014 0915   AST 23 11/13/2013 0933   ALT 11 02/08/2015 0757   ALT 14 11/05/2014 0915   ALT 10 11/13/2013 0933   BILITOT 0.72 02/08/2015 0757   BILITOT 0.7 11/05/2014 0915   BILITOT 0.60 11/13/2013 0933     Impression and Plan: Mr. Bungert is a very pleasant 75 yo male with hypotestosteronemia. He also has a history of splenic marginal zone lymphoma. He had a splenectomy in 2009 and completed 1 cycle of Rituxan in April 2010. He continues to do well and is asymptomatic at this time. There has been no evidence of recurrence.  He will continue to receive testosterone injections monthly. His last level was still low at 209. We will see what his level today shows.  We will plan to see him back in 3 months for labs and follow-up. He knows to contact us with any questions or concerns. We can certainly see him sooner if need be.   Eliezer Bottom, NP 1/17/201711:12 AM

## 2015-05-18 NOTE — Patient Instructions (Signed)

## 2015-05-19 LAB — TESTOSTERONE: TESTOSTERONE: 372 ng/dL (ref 348–1197)

## 2015-06-07 ENCOUNTER — Telehealth: Payer: Self-pay | Admitting: Hematology & Oncology

## 2015-06-07 NOTE — Telephone Encounter (Signed)
Patient called for his upcoming appt(s). Confirmed appt(s) with patient.       [facility].

## 2015-06-21 ENCOUNTER — Ambulatory Visit (HOSPITAL_BASED_OUTPATIENT_CLINIC_OR_DEPARTMENT_OTHER): Payer: Medicare Other

## 2015-06-21 VITALS — BP 176/60 | HR 72 | Temp 97.5°F | Resp 18

## 2015-06-21 DIAGNOSIS — E291 Testicular hypofunction: Secondary | ICD-10-CM

## 2015-06-21 DIAGNOSIS — E349 Endocrine disorder, unspecified: Secondary | ICD-10-CM

## 2015-06-21 MED ORDER — TESTOSTERONE CYPIONATE 200 MG/ML IM SOLN
400.0000 mg | INTRAMUSCULAR | Status: DC
Start: 2015-06-21 — End: 2015-06-21
  Administered 2015-06-21: 400 mg via INTRAMUSCULAR

## 2015-06-21 MED ORDER — TESTOSTERONE CYPIONATE 200 MG/ML IM SOLN
INTRAMUSCULAR | Status: AC
  Filled 2015-06-21: qty 2

## 2015-06-21 NOTE — Patient Instructions (Signed)

## 2015-06-28 ENCOUNTER — Encounter (HOSPITAL_COMMUNITY): Payer: Self-pay | Admitting: Emergency Medicine

## 2015-06-28 ENCOUNTER — Emergency Department (HOSPITAL_COMMUNITY): Payer: Medicare Other

## 2015-06-28 ENCOUNTER — Emergency Department (HOSPITAL_COMMUNITY)
Admission: EM | Admit: 2015-06-28 | Discharge: 2015-06-28 | Disposition: A | Discharge status: Home / Self Care | Payer: Medicare Other | Attending: Emergency Medicine | Admitting: Emergency Medicine

## 2015-06-28 DIAGNOSIS — W01198A Fall on same level from slipping, tripping and stumbling with subsequent striking against other object, initial encounter: Secondary | ICD-10-CM | POA: Diagnosis not present

## 2015-06-28 DIAGNOSIS — S60811A Abrasion of right wrist, initial encounter: Secondary | ICD-10-CM | POA: Diagnosis not present

## 2015-06-28 DIAGNOSIS — I10 Essential (primary) hypertension: Secondary | ICD-10-CM | POA: Insufficient documentation

## 2015-06-28 DIAGNOSIS — Y9389 Activity, other specified: Secondary | ICD-10-CM | POA: Diagnosis not present

## 2015-06-28 DIAGNOSIS — Z79899 Other long term (current) drug therapy: Secondary | ICD-10-CM | POA: Diagnosis not present

## 2015-06-28 DIAGNOSIS — Z7984 Long term (current) use of oral hypoglycemic drugs: Secondary | ICD-10-CM | POA: Insufficient documentation

## 2015-06-28 DIAGNOSIS — S0990XA Unspecified injury of head, initial encounter: Secondary | ICD-10-CM

## 2015-06-28 DIAGNOSIS — Y92099 Unspecified place in other non-institutional residence as the place of occurrence of the external cause: Secondary | ICD-10-CM | POA: Insufficient documentation

## 2015-06-28 DIAGNOSIS — Z8719 Personal history of other diseases of the digestive system: Secondary | ICD-10-CM | POA: Insufficient documentation

## 2015-06-28 DIAGNOSIS — Z8572 Personal history of non-Hodgkin lymphomas: Secondary | ICD-10-CM | POA: Insufficient documentation

## 2015-06-28 DIAGNOSIS — Y998 Other external cause status: Secondary | ICD-10-CM | POA: Diagnosis not present

## 2015-06-28 DIAGNOSIS — E119 Type 2 diabetes mellitus without complications: Secondary | ICD-10-CM | POA: Diagnosis not present

## 2015-06-28 DIAGNOSIS — S01111A Laceration without foreign body of right eyelid and periocular area, initial encounter: Secondary | ICD-10-CM | POA: Insufficient documentation

## 2015-06-28 DIAGNOSIS — E079 Disorder of thyroid, unspecified: Secondary | ICD-10-CM | POA: Diagnosis not present

## 2015-06-28 DIAGNOSIS — S098XXA Other specified injuries of head, initial encounter: Secondary | ICD-10-CM | POA: Diagnosis not present

## 2015-06-28 MED ORDER — TETANUS-DIPHTH-ACELL PERTUSSIS 5-2.5-18.5 LF-MCG/0.5 IM SUSP
0.5000 mL | Freq: Once | INTRAMUSCULAR | Status: AC
  Administered 2015-06-28: 0.5 mL via INTRAMUSCULAR
  Filled 2015-06-28: qty 0.5

## 2015-06-28 NOTE — ED Provider Notes (Signed)
CSN: [id number]     Arrival date & time 06/28/15  1523 History   First MD Initiated Contact with Patient 06/28/15 1958     Chief Complaint  Patient presents with  . Fall     (Consider location/radiation/quality/duration/timing/severity/associated sxs/prior Treatment) HPI Comments: The patient is a 75 year old male, he presents after having a fall at home when he states that his cat ran between his legs, it caused him to fall striking his head against a cement bird bath. He fell to the ground, he has no other injuries other than the scrape to his right lateral wrist. He is feeling normal, he denies having a headache, denies nausea, vomiting, seizure, blurred vision, weakness or numbness. The symptoms are mild, persistent, this occurred a short while ago and he states that the only reason he is here is because his wife forced him to come. He takes his medications religiously, he denies missing anything and denies any other symptoms predisposing him to the fall today.  Patient is a 75 y.o. male presenting with fall. The history is provided by the patient.  Fall    Past Medical History  Diagnosis Date  . Cancer ([place])     Non Hodgkins Lymphoma  . Hernia 1949  . Diabetes mellitus 5-6 yrs    T2DM  . Hypertension   . Hyperkalemia   . 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 Hypotestosteronism 03/29/2011  . Thyroid disease    Past Surgical History  Procedure Laterality Date  . Splenectomy, total  2009  . Appendectomy  1948  . Pancreatectomy  2009    Partial w/ splenectomy  . Tonsillectomy    . Hernia repair  1949    unknown what type of hernia  . Laparotomy  08/11/2011    Procedure: EXPLORATORY LAPAROTOMY;  Surgeon: Zenovia Jarred, MD;  Location: [facility];  Service: General;  Laterality: N/A;   History reviewed. No pertinent family history. Social History  Substance Use Topics  . Smoking status: Never Smoker   . Smokeless tobacco: Never Used     Comment: Never  used tobacco  . Alcohol Use: No    Review of Systems  All other systems reviewed and are negative.     Allergies  Iohexol  Home Medications   Prior to Admission medications   Medication Sig Start Date End Date Taking? Authorizing Provider  allopurinol (ZYLOPRIM) 100 MG tablet Take 100 mg by mouth daily.  10/20/14  Yes Historical Provider, MD  amLODipine (NORVASC) 10 MG tablet Take 10 mg by mouth daily.     Yes Historical Provider, MD  doxazosin (CARDURA) 4 MG tablet Take 8 mg by mouth 2 (two) times daily. 2 tabs twice daily 10/20/14  Yes Historical Provider, MD  furosemide (LASIX) 40 MG tablet Take 80 mg by mouth daily.    Yes Historical Provider, MD  levothyroxine (SYNTHROID, LEVOTHROID) 150 MCG tablet Take 150 mcg by mouth daily.     Yes Historical Provider, MD  omeprazole (PRILOSEC) 20 MG capsule Take 20 mg by mouth 2 (two) times daily.  11/27/11  Yes Historical Provider, MD  sitaGLIPtin (JANUVIA) 50 MG tablet Take 50 mg by mouth daily.   Yes Historical Provider, MD  triamcinolone ointment (KENALOG) 0.1 % Apply 1 application topically daily as needed (for irritation).  05/09/15  Yes Historical Provider, MD  febuxostat (ULORIC) 40 MG tablet Take 1 tablet (40 mg total) by mouth daily. 07/15/13   Volanda Napoleon, MD   BP [phone]  mmHg  Pulse 103  Temp(Src) 98.1 F (36.7 C) (Oral)  Resp 20  SpO2 96% Physical Exam  Constitutional: He appears well-developed and well-nourished. No distress.  HENT:  Head: Normocephalic.  Mouth/Throat: Oropharynx is clear and moist. No oropharyngeal exudate.  No hemotympanum, malocclusion, raccoon eyes, battle sign. He does have a small 2-1/2 cm laceration to the right periorbital skin, there is no tenderness over the orbital rim, there is no bleeding, no gaping, no foreign bodies  Eyes: Conjunctivae and EOM are normal. Pupils are equal, round, and reactive to light. Right eye exhibits no discharge. Left eye exhibits no discharge. No scleral icterus.  Neck:  Normal range of motion. Neck supple. No JVD present. No thyromegaly present.  Cardiovascular: Normal rate, regular rhythm, normal heart sounds and intact distal pulses.  Exam reveals no gallop and no friction rub.   No murmur heard. Pulmonary/Chest: Effort normal and breath sounds normal. No respiratory distress. He has no wheezes. He has no rales.  Abdominal: Soft. Bowel sounds are normal. He exhibits no distension and no mass. There is no tenderness.  Musculoskeletal: Normal range of motion. He exhibits no edema or tenderness.  The compartments are all soft, joints are supple, no tenderness, no deformities, full range of motion of all major joints including the right wrist  Lymphadenopathy:    He has no cervical adenopathy.  Neurological: He is alert. Coordination normal.  Normal speech, strength, sensation, coordination, mental status  Skin: Skin is warm and dry. No rash noted. No erythema.  Abrasion over the right lateral wrist, no laceration in need of repair  Psychiatric: He has a normal mood and affect. His behavior is normal.  Nursing note and vitals reviewed.   ED Course  Procedures (including critical care time) Labs Review Labs Reviewed - No data to display  Imaging Review Ct Head Wo Contrast  06/28/2015  CLINICAL DATA:  FALL WITH LAC TO RIGHT TEMPLETRIPPED OVER CAT IN YARD AND HIT HEAD ON BIRDBATH PMH: CA, DM, HTN, Hernia; Hyperkalemia; Hypotestosteronism; Thyroid disease EXAM: CT HEAD WITHOUT CONTRAST TECHNIQUE: Contiguous axial images were obtained from the base of the skull through the vertex without intravenous contrast. COMPARISON:  None. FINDINGS: The ventricles are normal configuration. There is ventricular and sulcal enlargement reflecting mild generalized atrophy. No hydrocephalus. There are no parenchymal masses or mass effect. There is no evidence of a cortical infarct. Mild patchy white matter hypoattenuation is noted consistent with chronic microvascular ischemic  change. There are no extra-axial masses or abnormal fluid collections. There is no intracranial hemorrhage. No skull fracture. Mild posterior ethmoid air cell mucosal thickening. Remaining visualized sinuses and the mastoid air cells are clear. IMPRESSION: 1. No acute intracranial abnormalities.  No skull fracture. 2. Mild generalized atrophy and chronic microvascular ischemic change. Electronically Signed   By: Lajean Manes M.D.   On: 06/28/2015 18:04   I have personally reviewed and evaluated these images and lab results as part of my medical decision-making.    MDM   Final diagnoses:  Head injury, initial encounter    Well-appearing, minor head injury, vital signs show some hypertension but no other acute findings. I do not think that the hypertension predisposed him to these symptoms or this fall, the patient has a normal head CT without any intracranial findings and no fractures of the skull, he can be safely discharged home after his wound is repaired with Dermabond, see the one below repair note.  LACERATION REPAIR Performed by: Johnna Acosta Authorized by: Noemi Chapel  D Consent: Verbal consent obtained. Risks and benefits: risks, benefits and alternatives were discussed Consent given by: patient Patient identity confirmed: provided demographic data Prepped and Draped in normal sterile fashion Wound explored  Laceration Location: R face  Laceration Length: 2.5 cm  No Foreign Bodies seen or palpated  Anesthesia: local infiltration  Local anesthetic:   Irrigation method: syringe Amount of cleaning: standard  Skin closure: dermabond  Technique: dermabond  Patient tolerance: Patient tolerated the procedure well with no immediate complications.     Noemi Chapel, MD 06/28/15 2116

## 2015-06-28 NOTE — ED Notes (Signed)
Pt sts fall today and hit head; pt denies blood thinners or LOC; small lac noted with bleeding controlled; pt with abrasion to right hand and right leg

## 2015-07-19 ENCOUNTER — Ambulatory Visit (HOSPITAL_BASED_OUTPATIENT_CLINIC_OR_DEPARTMENT_OTHER): Payer: Medicare Other

## 2015-07-19 VITALS — BP 187/66 | HR 81 | Temp 97.7°F

## 2015-07-19 DIAGNOSIS — E291 Testicular hypofunction: Secondary | ICD-10-CM | POA: Diagnosis not present

## 2015-07-19 DIAGNOSIS — E349 Endocrine disorder, unspecified: Secondary | ICD-10-CM

## 2015-07-19 MED ORDER — TESTOSTERONE CYPIONATE 200 MG/ML IM SOLN
INTRAMUSCULAR | Status: AC
  Filled 2015-07-19: qty 2

## 2015-07-19 MED ORDER — TESTOSTERONE CYPIONATE 200 MG/ML IM SOLN
400.0000 mg | INTRAMUSCULAR | Status: DC
  Administered 2015-07-19: 400 mg via INTRAMUSCULAR

## 2015-07-19 NOTE — Patient Instructions (Signed)

## 2015-08-17 ENCOUNTER — Other Ambulatory Visit (HOSPITAL_BASED_OUTPATIENT_CLINIC_OR_DEPARTMENT_OTHER): Payer: Medicare Other

## 2015-08-17 ENCOUNTER — Encounter: Payer: Self-pay | Admitting: Family

## 2015-08-17 ENCOUNTER — Ambulatory Visit (HOSPITAL_BASED_OUTPATIENT_CLINIC_OR_DEPARTMENT_OTHER): Payer: Medicare Other

## 2015-08-17 ENCOUNTER — Ambulatory Visit (HOSPITAL_BASED_OUTPATIENT_CLINIC_OR_DEPARTMENT_OTHER): Payer: Medicare Other | Admitting: Family

## 2015-08-17 VITALS — BP 169/52 | HR 69 | Temp 97.5°F | Resp 16 | Ht 68.0 in | Wt 194.0 lb

## 2015-08-17 DIAGNOSIS — E291 Testicular hypofunction: Secondary | ICD-10-CM

## 2015-08-17 DIAGNOSIS — Z8579 Personal history of other malignant neoplasms of lymphoid, hematopoietic and related tissues: Secondary | ICD-10-CM | POA: Diagnosis not present

## 2015-08-17 DIAGNOSIS — E349 Endocrine disorder, unspecified: Secondary | ICD-10-CM

## 2015-08-17 DIAGNOSIS — C8307 Small cell B-cell lymphoma, spleen: Secondary | ICD-10-CM

## 2015-08-17 LAB — CBC WITH DIFFERENTIAL (CANCER CENTER ONLY)
BASO#: 0.1 10*3/uL (ref 0.0–0.2)
BASO%: 0.7 % (ref 0.0–2.0)
EOS ABS: 0.8 10*3/uL — AB (ref 0.0–0.5)
EOS%: 10.3 % — ABNORMAL HIGH (ref 0.0–7.0)
HEMATOCRIT: 31.4 % — AB (ref 38.7–49.9)
HEMOGLOBIN: 10.8 g/dL — AB (ref 13.0–17.1)
LYMPH#: 1.4 10*3/uL (ref 0.9–3.3)
LYMPH%: 18.7 % (ref 14.0–48.0)
MCH: 34.7 pg — AB (ref 28.0–33.4)
MCHC: 34.4 g/dL (ref 32.0–35.9)
MCV: 101 fL — AB (ref 82–98)
MONO#: 1.1 10*3/uL — AB (ref 0.1–0.9)
MONO%: 15.3 % — ABNORMAL HIGH (ref 0.0–13.0)
NEUT%: 55 % (ref 40.0–80.0)
NEUTROS ABS: 4.1 10*3/uL (ref 1.5–6.5)
Platelets: 232 10*3/uL (ref 145–400)
RBC: 3.11 10*6/uL — AB (ref 4.20–5.70)
RDW: 14.2 % (ref 11.1–15.7)
WBC: 7.4 10*3/uL (ref 4.0–10.0)

## 2015-08-17 LAB — COMPREHENSIVE METABOLIC PANEL
ALBUMIN: 3.4 g/dL — AB (ref 3.5–5.0)
ALT: 9 U/L (ref 0–55)
ANION GAP: 10 meq/L (ref 3–11)
AST: 16 U/L (ref 5–34)
Alkaline Phosphatase: 98 U/L (ref 40–150)
BILIRUBIN TOTAL: 0.71 mg/dL (ref 0.20–1.20)
BUN: 42.5 mg/dL — ABNORMAL HIGH (ref 7.0–26.0)
CALCIUM: 9.2 mg/dL (ref 8.4–10.4)
CO2: 24 mEq/L (ref 22–29)
Chloride: 107 mEq/L (ref 98–109)
Creatinine: 2.4 mg/dL — ABNORMAL HIGH (ref 0.7–1.3)
EGFR: 26 mL/min/{1.73_m2} — AB (ref >90)
GLUCOSE: 175 mg/dL — AB (ref 70–140)
Potassium: 4.6 mEq/L (ref 3.5–5.1)
SODIUM: 141 meq/L (ref 136–145)
TOTAL PROTEIN: 7.3 g/dL (ref 6.4–8.3)

## 2015-08-17 MED ORDER — TESTOSTERONE CYPIONATE 200 MG/ML IM SOLN
INTRAMUSCULAR | Status: AC
  Filled 2015-08-17: qty 2

## 2015-08-17 MED ORDER — TESTOSTERONE CYPIONATE 200 MG/ML IM SOLN
400.0000 mg | INTRAMUSCULAR | Status: DC
  Administered 2015-08-17: 400 mg via INTRAMUSCULAR

## 2015-08-17 NOTE — Patient Instructions (Signed)

## 2015-08-17 NOTE — Progress Notes (Signed)
Hematology and Oncology Follow Up Visit  SAIYAN ROCCAFORTE OZ:[id number] 28-Nov-1940 75 y.o. 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  2. Hypotestosteronemia.  3. Chronic renal insufficiency.  Current Therapy:   Testosterone 400 mg IM once a month    Interim History: Ms. Kuhnke is here today for a follow-up. He continues to do well and has no complaints at this time. He is enjoying the nice weather and doing yard work each day.  He denies fatigue. No fever, chills, n/v, cough, rash, dizziness, headache, blurred vision, SOB, chest pain, palpitations, abdominal pain or changes in bowel or bladder habits.  He has chronic swelling in his lower extremities and takes lasix daily. No tenderness, numbness or tingling in his extremities. No c/o joint aches or pain.  He states that his blood sugars have been fairly well controlled. He is eating well and staying hydrated. His weight is unchanged.    Medications:    Medication List       This list is accurate as of: 08/17/15  9:24 AM.  Always use your most recent med list.               allopurinol 100 MG tablet  Commonly known as:  ZYLOPRIM  Take 100 mg by mouth daily.     amLODipine 10 MG tablet  Commonly known as:  NORVASC  Take 10 mg by mouth daily.     doxazosin 4 MG tablet  Commonly known as:  CARDURA  Take 8 mg by mouth 2 (two) times daily. 2 tabs twice daily     febuxostat 40 MG tablet  Commonly known as:  ULORIC  Take 1 tablet (40 mg total) by mouth daily.     furosemide 40 MG tablet  Commonly known as:  LASIX  Take 80 mg by mouth daily.     levothyroxine 150 MCG tablet  Commonly known as:  SYNTHROID, LEVOTHROID  Take 150 mcg by mouth daily.     omeprazole 20 MG capsule  Commonly known as:  PRILOSEC  Take 20 mg by mouth 2 (two) times daily.     sitaGLIPtin 50 MG tablet  Commonly known as:  JANUVIA  Take 50 mg by mouth daily.     triamcinolone ointment 0.1  %  Commonly known as:  KENALOG  Apply 1 application topically daily as needed (for irritation).        Allergies:  Allergies  Allergen Reactions  . Iohexol Other (See Comments)    Pt states has history of reaction in the past, can not remember what exactly happened, but was told by the dr never to have the contrast again.    Past Medical History, Surgical history, Social history, and Family History were reviewed and updated.  Review of Systems: All other 10 point review of systems is negative.   Physical Exam:  height is 5\' 8"  (1.727 m) and weight is 194 lb (87.998 kg). His oral temperature is 97.5 F (36.4 C). His blood pressure is 169/52 and his pulse is 69. His respiration is 16.   Wt Readings from Last 3 Encounters:  08/17/15 194 lb (87.998 kg)  05/18/15 187 lb (84.823 kg)  04/07/15 189 lb (85.73 kg)    Ocular: Sclerae unicteric, pupils equal, round and reactive to light Ear-nose-throat: Oropharynx clear, dentition fair Lymphatic: No cervical supraclavicular or axillary adenopathy Lungs no rales or rhonchi, good excursion bilaterally Heart regular rate and rhythm, no murmur appreciated Abd  soft, nontender, positive bowel sounds, no liver tip palpated on exam, no fluid wave MSK no focal spinal tenderness, no joint edema Neuro: non-focal, well-oriented, appropriate affect  Lab Results  Component Value Date   WBC 7.4 08/17/2015   HGB 10.8* 08/17/2015   HCT 31.4* 08/17/2015   MCV 101* 08/17/2015   PLT 232 08/17/2015   Lab Results  Component Value Date   FERRITIN 93 03/10/2014   IRON 88 03/10/2014   TIBC 253 03/10/2014   UIBC 164 03/10/2014   IRONPCTSAT 35 03/10/2014   Lab Results  Component Value Date   RETICCTPCT 0.9 03/29/2011   RBC 3.11* 08/17/2015   RETICCTABS 31.8 03/29/2011   No results found for: KPAFRELGTCHN, LAMBDASER, KAPLAMBRATIO No results found for: Kandis Cocking, IGMSERUM Lab Results  Component Value Date   TOTALPROTELP 6.7 07/14/2011    ALBUMINELP 56.6 07/14/2011   A1GS 5.7* 07/14/2011   A2GS 12.7* 07/14/2011   BETS 4.9 07/14/2011   BETA2SER 3.8 07/14/2011   GAMS 16.3 07/14/2011   MSPIKE NOT DET 07/14/2011   SPEI * 07/14/2011     Chemistry      Component Value Date/Time   NA 141 05/18/2015 1030   NA 140 04/07/2015 1054   NA 141 11/13/2013 0933   K 4.5 05/18/2015 1030   K 4.5 04/07/2015 1054   K 3.9 11/13/2013 0933   CL 106 04/07/2015 1054   CL 101 11/13/2013 0933   CO2 25 05/18/2015 1030   CO2 25 04/07/2015 1054   CO2 26 11/13/2013 0933   BUN 35.5* 05/18/2015 1030   BUN 44* 04/07/2015 1054   BUN 34* 11/13/2013 0933   CREATININE 2.3* 05/18/2015 1030   CREATININE 2.37* 04/07/2015 1054   CREATININE 1.8* 11/13/2013 0933      Component Value Date/Time   CALCIUM 8.8 05/18/2015 1030   CALCIUM 9.6 04/07/2015 1054   CALCIUM 8.9 11/13/2013 0933   ALKPHOS 93 05/18/2015 1030   ALKPHOS 76 11/05/2014 0915   ALKPHOS 76 11/13/2013 0933   AST 17 05/18/2015 1030   AST 22 11/05/2014 0915   AST 23 11/13/2013 0933   ALT 13 05/18/2015 1030   ALT 14 11/05/2014 0915   ALT 10 11/13/2013 0933   BILITOT 0.75 05/18/2015 1030   BILITOT 0.7 11/05/2014 0915   BILITOT 0.60 11/13/2013 0933     Impression and Plan: Mr. Montpetit is a very pleasant 75 yo male with hypotestosteronemia. He also has a history of splenic marginal zone lymphoma. He had a splenectomy in 2009 and completed 1 cycle of Rituxan in April 2010. He continues to do well and is asymptomatic at this time. There has been no evidence of recurrence.  He will continue to receive testosterone injections monthly. His last level was 372. We will see what his level today shows.  We will plan to see him back in 3 months for labs and follow-up. He knows to contact us with any questions or concerns. We can certainly see him sooner if need be.   Eliezer Bottom, NP 4/18/20179:24 AM

## 2015-08-18 LAB — TESTOSTERONE: TESTOSTERONE: 392 ng/dL (ref 348–1197)

## 2015-09-14 ENCOUNTER — Ambulatory Visit (HOSPITAL_BASED_OUTPATIENT_CLINIC_OR_DEPARTMENT_OTHER): Payer: Medicare Other

## 2015-09-14 ENCOUNTER — Other Ambulatory Visit: Payer: Medicare Other

## 2015-09-14 VITALS — BP 183/48 | HR 65 | Temp 97.6°F | Resp 16

## 2015-09-14 DIAGNOSIS — E291 Testicular hypofunction: Secondary | ICD-10-CM

## 2015-09-14 DIAGNOSIS — E349 Endocrine disorder, unspecified: Secondary | ICD-10-CM

## 2015-09-14 MED ORDER — TESTOSTERONE CYPIONATE 200 MG/ML IM SOLN
INTRAMUSCULAR | Status: AC
  Filled 2015-09-14: qty 2

## 2015-09-14 MED ORDER — TESTOSTERONE CYPIONATE 200 MG/ML IM SOLN
400.0000 mg | INTRAMUSCULAR | Status: DC
  Administered 2015-09-14: 400 mg via INTRAMUSCULAR

## 2015-09-14 NOTE — Patient Instructions (Signed)

## 2015-09-16 ENCOUNTER — Ambulatory Visit: Payer: Medicare Other

## 2015-10-12 ENCOUNTER — Ambulatory Visit (HOSPITAL_BASED_OUTPATIENT_CLINIC_OR_DEPARTMENT_OTHER): Payer: Medicare Other

## 2015-10-12 VITALS — BP 156/65 | HR 62 | Temp 97.7°F | Resp 18

## 2015-10-12 DIAGNOSIS — E291 Testicular hypofunction: Secondary | ICD-10-CM

## 2015-10-12 DIAGNOSIS — E349 Endocrine disorder, unspecified: Secondary | ICD-10-CM

## 2015-10-12 MED ORDER — TESTOSTERONE CYPIONATE 200 MG/ML IM SOLN
400.0000 mg | INTRAMUSCULAR | Status: DC
  Administered 2015-10-12: 400 mg via INTRAMUSCULAR

## 2015-10-12 MED ORDER — TESTOSTERONE CYPIONATE 200 MG/ML IM SOLN
INTRAMUSCULAR | Status: AC
  Filled 2015-10-12: qty 2

## 2015-10-12 NOTE — Patient Instructions (Signed)

## 2015-10-15 DIAGNOSIS — R600 Localized edema: Secondary | ICD-10-CM | POA: Diagnosis not present

## 2015-10-15 DIAGNOSIS — N183 Chronic kidney disease, stage 3 (moderate): Secondary | ICD-10-CM | POA: Diagnosis not present

## 2015-10-15 DIAGNOSIS — I10 Essential (primary) hypertension: Secondary | ICD-10-CM | POA: Diagnosis not present

## 2015-11-16 ENCOUNTER — Other Ambulatory Visit (HOSPITAL_BASED_OUTPATIENT_CLINIC_OR_DEPARTMENT_OTHER): Payer: Medicare Other

## 2015-11-16 ENCOUNTER — Encounter: Payer: Self-pay | Admitting: Family

## 2015-11-16 ENCOUNTER — Ambulatory Visit (HOSPITAL_BASED_OUTPATIENT_CLINIC_OR_DEPARTMENT_OTHER): Payer: Medicare Other

## 2015-11-16 ENCOUNTER — Ambulatory Visit (HOSPITAL_BASED_OUTPATIENT_CLINIC_OR_DEPARTMENT_OTHER): Payer: Medicare Other | Admitting: Family

## 2015-11-16 VITALS — BP 159/50 | HR 61 | Temp 97.4°F | Resp 16 | Ht 68.0 in | Wt 179.1 lb

## 2015-11-16 DIAGNOSIS — Z8579 Personal history of other malignant neoplasms of lymphoid, hematopoietic and related tissues: Secondary | ICD-10-CM

## 2015-11-16 DIAGNOSIS — E349 Endocrine disorder, unspecified: Secondary | ICD-10-CM

## 2015-11-16 DIAGNOSIS — E291 Testicular hypofunction: Secondary | ICD-10-CM | POA: Diagnosis not present

## 2015-11-16 DIAGNOSIS — C8307 Small cell B-cell lymphoma, spleen: Secondary | ICD-10-CM

## 2015-11-16 LAB — COMPREHENSIVE METABOLIC PANEL
ALT: <9 U/L (ref 0–55)
AST: 16 U/L (ref 5–34)
Albumin: 3.6 g/dL (ref 3.5–5.0)
Alkaline Phosphatase: 97 U/L (ref 40–150)
Anion Gap: 9 mEq/L (ref 3–11)
BUN: 52.9 mg/dL — ABNORMAL HIGH (ref 7.0–26.0)
CO2: 25 meq/L (ref 22–29)
Calcium: 9.3 mg/dL (ref 8.4–10.4)
Chloride: 106 mEq/L (ref 98–109)
Creatinine: 2.4 mg/dL — ABNORMAL HIGH (ref 0.7–1.3)
EGFR: 25 mL/min/{1.73_m2} — AB (ref >90)
GLUCOSE: 151 mg/dL — AB (ref 70–140)
POTASSIUM: 4.7 meq/L (ref 3.5–5.1)
SODIUM: 140 meq/L (ref 136–145)
Total Bilirubin: 0.47 mg/dL (ref 0.20–1.20)
Total Protein: 7.3 g/dL (ref 6.4–8.3)

## 2015-11-16 LAB — CBC WITH DIFFERENTIAL (CANCER CENTER ONLY)
BASO#: 0.1 10*3/uL (ref 0.0–0.2)
BASO%: 0.9 % (ref 0.0–2.0)
EOS ABS: 0.6 10*3/uL — AB (ref 0.0–0.5)
EOS%: 9.3 % — ABNORMAL HIGH (ref 0.0–7.0)
HCT: 31.3 % — ABNORMAL LOW (ref 38.7–49.9)
HGB: 10.8 g/dL — ABNORMAL LOW (ref 13.0–17.1)
LYMPH#: 1.4 10*3/uL (ref 0.9–3.3)
LYMPH%: 22.3 % (ref 14.0–48.0)
MCH: 34.5 pg — AB (ref 28.0–33.4)
MCHC: 34.5 g/dL (ref 32.0–35.9)
MCV: 100 fL — ABNORMAL HIGH (ref 82–98)
MONO#: 1 10*3/uL — ABNORMAL HIGH (ref 0.1–0.9)
MONO%: 15.9 % — AB (ref 0.0–13.0)
NEUT#: 3.3 10*3/uL (ref 1.5–6.5)
NEUT%: 51.6 % (ref 40.0–80.0)
PLATELETS: 226 10*3/uL (ref 145–400)
RBC: 3.13 10*6/uL — AB (ref 4.20–5.70)
RDW: 14.5 % (ref 11.1–15.7)
WBC: 6.4 10*3/uL (ref 4.0–10.0)

## 2015-11-16 MED ORDER — TESTOSTERONE CYPIONATE 200 MG/ML IM SOLN
INTRAMUSCULAR | Status: AC
  Filled 2015-11-16: qty 2

## 2015-11-16 MED ORDER — TESTOSTERONE CYPIONATE 200 MG/ML IM SOLN
400.0000 mg | INTRAMUSCULAR | Status: DC
  Administered 2015-11-16: 400 mg via INTRAMUSCULAR

## 2015-11-16 NOTE — Patient Instructions (Signed)

## 2015-11-16 NOTE — Progress Notes (Signed)
Hematology and Oncology Follow Up Visit  Larry [name] [id number] 1940/05/11 75 y.o. 11/16/2015   Principle Diagnosis:  1. 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  2. Hypotestosteronemia.  3. Chronic renal insufficiency.  Current Therapy:   Testosterone 400 mg IM once a month    Interim History: Larry [name] is here today for a follow-up. He has had no problems and just returned from a trip up [place] with his wife. They had a wonderful time. He has had no issues with fatigue or infection. No lymphadenopathy found on exam.  No fever, chills, n/v, cough, rash, dizziness, headache, blurred vision, SOB, chest pain, palpitations, abdominal pain or changes in bowel or bladder habits.  The chronic swelling in his lower extremities is unchanged. He continues to take lasix daily. No tenderness, numbness or tingling in his extremities. No c/o joint aches or pain.  He has maintained a good appetite and is staying well hydrated. His states that his blood sugars have been controlled. On our scale his weight is down 15 lbs since his last visit. He states that he has been out working in the yard a lot and that according to his PCP he has gained weight.  Medications:    Medication List       This list is accurate as of: 11/16/15  8:48 AM.  Always use your most recent med list.               allopurinol 100 MG tablet  Commonly known as:  ZYLOPRIM  Take 100 mg by mouth daily.     amLODipine 10 MG tablet  Commonly known as:  NORVASC  Take 10 mg by mouth daily.     doxazosin 4 MG tablet  Commonly known as:  CARDURA  Take 8 mg by mouth 2 (two) times daily. 2 tabs twice daily     furosemide 40 MG tablet  Commonly known as:  LASIX  Take 80 mg by mouth daily.     levothyroxine 150 MCG tablet  Commonly known as:  SYNTHROID, LEVOTHROID  Take 150 mcg by mouth daily.     omeprazole 20 MG capsule  Commonly known as:  PRILOSEC  Take 20 mg by mouth 2 (two) times daily.       sitaGLIPtin 50 MG tablet  Commonly known as:  JANUVIA  Take 50 mg by mouth daily.     triamcinolone ointment 0.1 %  Commonly known as:  KENALOG  Apply 1 application topically daily as needed (for irritation).        Allergies:  Allergies  Allergen Reactions  . Iohexol Other (See Comments)    Pt states has history of reaction in the past, can not remember what exactly happened, but was told by the dr never to have the contrast again.    Past Medical History, Surgical history, Social history, and Family History were reviewed and updated.  Review of Systems: All other 10 point review of systems is negative.   Physical Exam:  height is 5\' 8"  (1.727 m) and weight is 179 lb 1.9 oz (81.248 kg). His oral temperature is 97.4 F (36.3 C). His blood pressure is 159/50 and his pulse is 61. His respiration is 16.   Wt Readings from Last 3 Encounters:  11/16/15 179 lb 1.9 oz (81.248 kg)  08/17/15 194 lb (87.998 kg)  05/18/15 187 lb (84.823 kg)    Ocular: Sclerae unicteric, pupils equal, round and reactive to light Ear-nose-throat: Oropharynx clear, dentition fair  Lymphatic: No cervical supraclavicular or axillary adenopathy Lungs no rales or rhonchi, good excursion bilaterally Heart regular rate and rhythm, no murmur appreciated Abd soft, nontender, positive bowel sounds, no liver tip palpated on exam, no fluid wave MSK no focal spinal tenderness, no joint edema Neuro: non-focal, well-oriented, appropriate affect  Lab Results  Component Value Date   WBC 6.4 11/16/2015   HGB 10.8* 11/16/2015   HCT 31.3* 11/16/2015   MCV 100* 11/16/2015   PLT 226 11/16/2015   Lab Results  Component Value Date   FERRITIN 93 03/10/2014   IRON 88 03/10/2014   TIBC 253 03/10/2014   UIBC 164 03/10/2014   IRONPCTSAT 35 03/10/2014   Lab Results  Component Value Date   RETICCTPCT 0.9 03/29/2011   RBC 3.13* 11/16/2015   RETICCTABS 31.8 03/29/2011   No results found for: KPAFRELGTCHN,  LAMBDASER, KAPLAMBRATIO No results found for: Kandis Cocking, IGMSERUM Lab Results  Component Value Date   TOTALPROTELP 6.7 07/14/2011   ALBUMINELP 56.6 07/14/2011   A1GS 5.7* 07/14/2011   A2GS 12.7* 07/14/2011   BETS 4.9 07/14/2011   BETA2SER 3.8 07/14/2011   GAMS 16.3 07/14/2011   MSPIKE NOT DET 07/14/2011   SPEI * 07/14/2011     Chemistry      Component Value Date/Time   NA 141 08/17/2015 0850   NA 140 04/07/2015 1054   NA 141 11/13/2013 0933   K 4.6 08/17/2015 0850   K 4.5 04/07/2015 1054   K 3.9 11/13/2013 0933   CL 106 04/07/2015 1054   CL 101 11/13/2013 0933   CO2 24 08/17/2015 0850   CO2 25 04/07/2015 1054   CO2 26 11/13/2013 0933   BUN 42.5* 08/17/2015 0850   BUN 44* 04/07/2015 1054   BUN 34* 11/13/2013 0933   CREATININE 2.4* 08/17/2015 0850   CREATININE 2.37* 04/07/2015 1054   CREATININE 1.8* 11/13/2013 0933      Component Value Date/Time   CALCIUM 9.2 08/17/2015 0850   CALCIUM 9.6 04/07/2015 1054   CALCIUM 8.9 11/13/2013 0933   ALKPHOS 98 08/17/2015 0850   ALKPHOS 76 11/05/2014 0915   ALKPHOS 76 11/13/2013 0933   AST 16 08/17/2015 0850   AST 22 11/05/2014 0915   AST 23 11/13/2013 0933   ALT 9 08/17/2015 0850   ALT 14 11/05/2014 0915   ALT 10 11/13/2013 0933   BILITOT 0.71 08/17/2015 0850   BILITOT 0.7 11/05/2014 0915   BILITOT 0.60 11/13/2013 0933     Impression and Plan: Larry [name] is a very pleasant 75 yo male with hypotestosteronemia. He also has a history of splenic marginal zone lymphoma. He had a splenectomy in 2009 and completed 1 cycle of Rituxan in April 2010. So far, there has been no evidence of recurrence. He is doing well at this time.  He will continue to receive testosterone injections monthly. His last level was 392. Level today is pending.  We will plan to see him back in 3 months for labs and follow-up. He will contact our office with any questions or concerns. We can certainly see him sooner if need be.   Eliezer Bottom,  NP 7/18/20178:48 AM

## 2015-11-17 LAB — TESTOSTERONE: TESTOSTERONE: 208 ng/dL — AB (ref 264–916)

## 2015-12-13 ENCOUNTER — Ambulatory Visit: Payer: Medicare Other

## 2015-12-14 ENCOUNTER — Ambulatory Visit (HOSPITAL_BASED_OUTPATIENT_CLINIC_OR_DEPARTMENT_OTHER): Payer: Medicare Other

## 2015-12-14 VITALS — BP 162/51 | HR 64 | Temp 97.5°F | Resp 20

## 2015-12-14 DIAGNOSIS — E349 Endocrine disorder, unspecified: Secondary | ICD-10-CM

## 2015-12-14 DIAGNOSIS — E291 Testicular hypofunction: Secondary | ICD-10-CM

## 2015-12-14 MED ORDER — TESTOSTERONE CYPIONATE 200 MG/ML IM SOLN
INTRAMUSCULAR | Status: AC
  Filled 2015-12-14: qty 2

## 2015-12-14 MED ORDER — TESTOSTERONE CYPIONATE 200 MG/ML IM SOLN
400.0000 mg | INTRAMUSCULAR | Status: DC
  Administered 2015-12-14: 400 mg via INTRAMUSCULAR

## 2015-12-14 NOTE — Patient Instructions (Signed)

## 2016-01-11 ENCOUNTER — Ambulatory Visit: Payer: Medicare Other

## 2016-01-12 ENCOUNTER — Ambulatory Visit (HOSPITAL_BASED_OUTPATIENT_CLINIC_OR_DEPARTMENT_OTHER): Payer: Medicare Other

## 2016-01-12 VITALS — BP 168/60 | HR 59 | Temp 97.6°F | Resp 20

## 2016-01-12 DIAGNOSIS — E291 Testicular hypofunction: Secondary | ICD-10-CM | POA: Diagnosis present

## 2016-01-12 DIAGNOSIS — Z23 Encounter for immunization: Secondary | ICD-10-CM

## 2016-01-12 DIAGNOSIS — E349 Endocrine disorder, unspecified: Secondary | ICD-10-CM

## 2016-01-12 MED ORDER — TESTOSTERONE CYPIONATE 200 MG/ML IM SOLN
INTRAMUSCULAR | Status: AC
  Filled 2016-01-12: qty 2

## 2016-01-12 MED ORDER — TESTOSTERONE CYPIONATE 200 MG/ML IM SOLN
400.0000 mg | INTRAMUSCULAR | Status: DC
  Administered 2016-01-12: 400 mg via INTRAMUSCULAR

## 2016-01-12 MED ORDER — INFLUENZA VAC SPLIT QUAD 0.5 ML IM SUSY
0.5000 mL | PREFILLED_SYRINGE | Freq: Once | INTRAMUSCULAR | Status: AC
  Administered 2016-01-12: 0.5 mL via INTRAMUSCULAR
  Filled 2016-01-12: qty 0.5

## 2016-01-12 NOTE — Patient Instructions (Addendum)
Testosterone injection What is this medicine? TESTOSTERONE (tes TOS ter one) is the main male hormone. It supports normal male development such as muscle growth, facial hair, and deep voice. It is used in males to treat low testosterone levels. This medicine may be used for other purposes; ask your health care provider or pharmacist if you have questions. What should I tell my health care provider before I take this medicine? They need to know if you have any of these conditions: -breast cancer -diabetes -heart disease -kidney disease -liver disease -lung disease -prostate cancer, enlargement -an unusual or allergic reaction to testosterone, other medicines, foods, dyes, or preservatives -pregnant or trying to get pregnant -breast-feeding How should I use this medicine? This medicine is for injection into a muscle. It is usually given by a health care professional in a hospital or clinic setting. Contact your pediatrician regarding the use of this medicine in children. While this medicine may be prescribed for children as young as 12 years of age for selected conditions, precautions do apply. Overdosage: If you think you have taken too much of this medicine contact a poison control center or emergency room at once. NOTE: This medicine is only for you. Do not share this medicine with others. What if I miss a dose? Try not to miss a dose. Your doctor or health care professional will tell you when your next injection is due. Notify the office if you are unable to keep an appointment. What may interact with this medicine? -medicines for diabetes -medicines that treat or prevent blood clots like warfarin -oxyphenbutazone -propranolol -steroid medicines like prednisone or cortisone This list may not describe all possible interactions. Give your health care provider a list of all the medicines, herbs, non-prescription drugs, or dietary supplements you use. Also tell them if you smoke, drink  alcohol, or use illegal drugs. Some items may interact with your medicine. What should I watch for while using this medicine? Visit your doctor or health care professional for regular checks on your progress. They will need to check the level of testosterone in your blood. This medicine is only approved for use in men who have low levels of testosterone related to certain medical conditions. Heart attacks and strokes have been reported with the use of this medicine. Notify your doctor or health care professional and seek emergency treatment if you develop breathing problems; changes in vision; confusion; chest pain or chest tightness; sudden arm pain; severe, sudden headache; trouble speaking or understanding; sudden numbness or weakness of the face, arm or leg; loss of balance or coordination. Talk to your doctor about the risks and benefits of this medicine. This medicine may affect blood sugar levels. If you have diabetes, check with your doctor or health care professional before you change your diet or the dose of your diabetic medicine. This drug is banned from use in athletes by most athletic organizations. What side effects may I notice from receiving this medicine? Side effects that you should report to your doctor or health care professional as soon as possible: -allergic reactions like skin rash, itching or hives, swelling of the face, lips, or tongue -breast enlargement -breathing problems -changes in mood, especially anger, depression, or rage -dark urine -general ill feeling or flu-like symptoms -light-colored stools -loss of appetite, nausea -nausea, vomiting -right upper belly pain -stomach pain -swelling of ankles -too frequent or persistent erections -trouble passing urine or change in the amount of urine -unusually weak or tired -yellowing of the eyes or   skin Additional side effects that can occur in women include: -deep or hoarse voice -facial hair growth -irregular  menstrual periods Side effects that usually do not require medical attention (report to your doctor or health care professional if they continue or are bothersome): -acne -change in sex drive or performance -hair loss -headache This list may not describe all possible side effects. Call your doctor for medical advice about side effects. You may report side effects to FDA at 1-800-FDA-1088. Where should I keep my medicine? Keep out of the reach of children. This medicine can be abused. Keep your medicine in a safe place to protect it from theft. Do not share this medicine with anyone. Selling or giving away this medicine is dangerous and against the law. Store at room temperature between 20 and 25 degrees C (68 and 77 degrees F). Do not freeze. Protect from light. Follow the directions for the product you are prescribed. Throw away any unused medicine after the expiration date. NOTE: This sheet is a summary. It may not cover all possible information. If you have questions about this medicine, talk to your doctor, pharmacist, or health care provider.    2016, [employer]/Gold Standard. (2013-07-03 [id number]) Influenza Virus Vaccine injection (Fluarix) What is this medicine? INFLUENZA VIRUS VACCINE (in floo EN zuh VAHY ruhs vak SEEN) helps to reduce the risk of getting influenza also known as the flu. This medicine may be used for other purposes; ask your health care provider or pharmacist if you have questions. What should I tell my health care provider before I take this medicine? They need to know if you have any of these conditions: -bleeding disorder like hemophilia -fever or infection -Guillain-Barre syndrome or other neurological problems -immune system problems -infection with the human immunodeficiency virus (HIV) or AIDS -low blood platelet counts -multiple sclerosis -an unusual or allergic reaction to influenza virus vaccine, eggs, chicken proteins, latex, gentamicin, other medicines,  foods, dyes or preservatives -pregnant or trying to get pregnant -breast-feeding How should I use this medicine? This vaccine is for injection into a muscle. It is given by a health care professional. A copy of Vaccine Information Statements will be given before each vaccination. Read this sheet carefully each time. The sheet may change frequently. Talk to your pediatrician regarding the use of this medicine in children. Special care may be needed. Overdosage: If you think you have taken too much of this medicine contact a poison control center or emergency room at once. NOTE: This medicine is only for you. Do not share this medicine with others. What if I miss a dose? This does not apply. What may interact with this medicine? -chemotherapy or radiation therapy -medicines that lower your immune system like etanercept, anakinra, infliximab, and adalimumab -medicines that treat or prevent blood clots like warfarin -phenytoin -steroid medicines like prednisone or cortisone -theophylline -vaccines This list may not describe all possible interactions. Give your health care provider a list of all the medicines, herbs, non-prescription drugs, or dietary supplements you use. Also tell them if you smoke, drink alcohol, or use illegal drugs. Some items may interact with your medicine. What should I watch for while using this medicine? Report any side effects that do not go away within 3 days to your doctor or health care professional. Call your health care provider if any unusual symptoms occur within 6 weeks of receiving this vaccine. You may still catch the flu, but the illness is not usually as bad. You cannot get the flu from  the vaccine. The vaccine will not protect against colds or other illnesses that may cause fever. The vaccine is needed every year. What side effects may I notice from receiving this medicine? Side effects that you should report to your doctor or health care professional as  soon as possible: -allergic reactions like skin rash, itching or hives, swelling of the face, lips, or tongue Side effects that usually do not require medical attention (report to your doctor or health care professional if they continue or are bothersome): -fever -headache -muscle aches and pains -pain, tenderness, redness, or swelling at site where injected -weak or tired This list may not describe all possible side effects. Call your doctor for medical advice about side effects. You may report side effects to FDA at 1-800-FDA-1088. Where should I keep my medicine? This vaccine is only given in a clinic, pharmacy, doctor's office, or other health care setting and will not be stored at home. NOTE: This sheet is a summary. It may not cover all possible information. If you have questions about this medicine, talk to your doctor, pharmacist, or health care provider.    2016, Elsevier/Gold Standard. (2007-11-13 09:30:40)

## 2016-02-15 ENCOUNTER — Encounter: Payer: Self-pay | Admitting: Hematology & Oncology

## 2016-02-15 ENCOUNTER — Ambulatory Visit (HOSPITAL_BASED_OUTPATIENT_CLINIC_OR_DEPARTMENT_OTHER): Payer: Medicare Other | Admitting: Hematology & Oncology

## 2016-02-15 ENCOUNTER — Other Ambulatory Visit (HOSPITAL_BASED_OUTPATIENT_CLINIC_OR_DEPARTMENT_OTHER): Payer: Medicare Other

## 2016-02-15 ENCOUNTER — Ambulatory Visit (HOSPITAL_BASED_OUTPATIENT_CLINIC_OR_DEPARTMENT_OTHER): Payer: Medicare Other

## 2016-02-15 VITALS — BP 120/68 | HR 78 | Temp 97.6°F | Resp 18 | Ht 68.0 in | Wt 198.2 lb

## 2016-02-15 DIAGNOSIS — C8307 Small cell B-cell lymphoma, spleen: Secondary | ICD-10-CM

## 2016-02-15 DIAGNOSIS — N189 Chronic kidney disease, unspecified: Secondary | ICD-10-CM | POA: Diagnosis not present

## 2016-02-15 DIAGNOSIS — E349 Endocrine disorder, unspecified: Secondary | ICD-10-CM

## 2016-02-15 DIAGNOSIS — D508 Other iron deficiency anemias: Secondary | ICD-10-CM

## 2016-02-15 DIAGNOSIS — E291 Testicular hypofunction: Secondary | ICD-10-CM

## 2016-02-15 DIAGNOSIS — N183 Chronic kidney disease, stage 3 unspecified: Secondary | ICD-10-CM

## 2016-02-15 DIAGNOSIS — Z8572 Personal history of non-Hodgkin lymphomas: Secondary | ICD-10-CM

## 2016-02-15 DIAGNOSIS — D631 Anemia in chronic kidney disease: Secondary | ICD-10-CM

## 2016-02-15 LAB — CBC WITH DIFFERENTIAL (CANCER CENTER ONLY)
BASO#: 0.1 10*3/uL (ref 0.0–0.2)
BASO%: 1.1 % (ref 0.0–2.0)
EOS%: 8.3 % — ABNORMAL HIGH (ref 0.0–7.0)
Eosinophils Absolute: 0.6 10*3/uL — ABNORMAL HIGH (ref 0.0–0.5)
HEMATOCRIT: 29.1 % — AB (ref 38.7–49.9)
HGB: 10.3 g/dL — ABNORMAL LOW (ref 13.0–17.1)
LYMPH#: 1.3 10*3/uL (ref 0.9–3.3)
LYMPH%: 19.5 % (ref 14.0–48.0)
MCH: 35.9 pg — ABNORMAL HIGH (ref 28.0–33.4)
MCHC: 35.4 g/dL (ref 32.0–35.9)
MCV: 101 fL — AB (ref 82–98)
MONO#: 0.7 10*3/uL (ref 0.1–0.9)
MONO%: 10.7 % (ref 0.0–13.0)
NEUT#: 4 10*3/uL (ref 1.5–6.5)
NEUT%: 60.4 % (ref 40.0–80.0)
Platelets: 221 10*3/uL (ref 145–400)
RBC: 2.87 10*6/uL — AB (ref 4.20–5.70)
RDW: 14.1 % (ref 11.1–15.7)
WBC: 6.6 10*3/uL (ref 4.0–10.0)

## 2016-02-15 LAB — COMPREHENSIVE METABOLIC PANEL
ALT: 7 U/L (ref 0–55)
ANION GAP: 10 meq/L (ref 3–11)
AST: 16 U/L (ref 5–34)
Albumin: 3.2 g/dL — ABNORMAL LOW (ref 3.5–5.0)
Alkaline Phosphatase: 99 U/L (ref 40–150)
BILIRUBIN TOTAL: 0.93 mg/dL (ref 0.20–1.20)
BUN: 47.4 mg/dL — ABNORMAL HIGH (ref 7.0–26.0)
CALCIUM: 9.1 mg/dL (ref 8.4–10.4)
CO2: 23 mEq/L (ref 22–29)
CREATININE: 2.6 mg/dL — AB (ref 0.7–1.3)
Chloride: 109 mEq/L (ref 98–109)
EGFR: 22 mL/min/{1.73_m2} — ABNORMAL LOW (ref >90)
Glucose: 148 mg/dl — ABNORMAL HIGH (ref 70–140)
Potassium: 4.4 mEq/L (ref 3.5–5.1)
Sodium: 142 mEq/L (ref 136–145)
TOTAL PROTEIN: 6.6 g/dL (ref 6.4–8.3)

## 2016-02-15 MED ORDER — TESTOSTERONE CYPIONATE 200 MG/ML IM SOLN
400.0000 mg | INTRAMUSCULAR | Status: DC
  Administered 2016-02-15: 400 mg via INTRAMUSCULAR

## 2016-02-15 MED ORDER — TESTOSTERONE CYPIONATE 200 MG/ML IM SOLN
INTRAMUSCULAR | Status: AC
  Filled 2016-02-15: qty 2

## 2016-02-15 NOTE — Progress Notes (Signed)
Hematology and Oncology Follow Up Visit  Larry [name] [id number] 03/28/1941 75 y.o. 02/15/2016   Principle Diagnosis:  1. 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  2. Hypotestosteronemia.  3. Chronic renal insufficiency.  Current Therapy:   Testosterone 400 mg IM once a month    Interim History: Larry [name] is here today for a follow-up. He is feeling okay. He has had no specific complaints. There's been no change in his medications. He does have quite a bit of leg swelling. Has chronic renal insufficiency. He sees the nephrologist every 6 months.  His last time we saw him back in July, his testosterone was 208. As such, the supplemental testosterone that we give him definitely makes a difference.  His diabetes is doing fairly well.  He has mild anemia. He has had iron in the past. Last vomited his iron stores were back in 2015. We probably will have to check again.  He's had no bleeding. There's been no change in bowel or bladder habits.  He has had no nausea or vomiting.  Medications:    Medication List       Accurate as of 02/15/16 11:13 AM. Always use your most recent med list.          allopurinol 100 MG tablet Commonly known as:  ZYLOPRIM Take 100 mg by mouth daily.   amLODipine 10 MG tablet Commonly known as:  NORVASC Take 10 mg by mouth daily.   doxazosin 4 MG tablet Commonly known as:  CARDURA Take 8 mg by mouth 2 (two) times daily. 2 tabs twice daily   furosemide 40 MG tablet Commonly known as:  LASIX Take 80 mg by mouth daily.   levothyroxine 150 MCG tablet Commonly known as:  SYNTHROID, LEVOTHROID Take 150 mcg by mouth daily.   omeprazole 20 MG capsule Commonly known as:  PRILOSEC Take 20 mg by mouth 2 (two) times daily.   sitaGLIPtin 50 MG tablet Commonly known as:  JANUVIA Take 50 mg by mouth daily.   triamcinolone ointment 0.1 % Commonly known as:  KENALOG Apply 1 application topically daily as needed (for  irritation).       Allergies:  Allergies  Allergen Reactions  . Iohexol Other (See Comments)    Pt states has history of reaction in the past, can not remember what exactly happened, but was told by the dr never to have the contrast again.    Past Medical History, Surgical history, Social history, and Family History were reviewed and updated.  Review of Systems: All other 10 point review of systems is negative.   Physical Exam:  height is 5\' 8"  (1.727 m) and weight is 198 lb 3.2 oz (89.9 kg). His temperature is 97.6 F (36.4 C). His blood pressure is 120/68 and his pulse is 78. His respiration is 18 and oxygen saturation is 95%.   Wt Readings from Last 3 Encounters:  02/15/16 198 lb 3.2 oz (89.9 kg)  11/16/15 179 lb 1.9 oz (81.2 kg)  08/17/15 194 lb (88 kg)    Well-developed and well-nourished white male in no obvious distress. Head and neck exam shows no ocular or oral lesions. He has no scleral icterus. There is no palpable cervical or supraclavicular lymph nodes. Lungs are clear bilaterally. Cardiac exam regular rate and rhythm with no with a 2/6 systolic ejection murmur. Abdomen is soft. Has a splint to be scar. Has a laparotomy scar in the midline. A fluid wave is noted. There is no guarding  or rebound tenderness. There is no palpable hepatomegaly. Extremities shows 3+ chronic edema in his lower legs. Skin exam shows some actinic keratoses. Neurological exam shows no focal neurological deficits.  Lab Results  Component Value Date   WBC 6.6 02/15/2016   HGB 10.3 (L) 02/15/2016   HCT 29.1 (L) 02/15/2016   MCV 101 (H) 02/15/2016   PLT 221 02/15/2016   Lab Results  Component Value Date   FERRITIN 93 03/10/2014   IRON 88 03/10/2014   TIBC 253 03/10/2014   UIBC 164 03/10/2014   IRONPCTSAT 35 03/10/2014   Lab Results  Component Value Date   RETICCTPCT 0.9 03/29/2011   RBC 2.87 (L) 02/15/2016   RETICCTABS 31.8 03/29/2011   No results found for: KPAFRELGTCHN, LAMBDASER,  KAPLAMBRATIO No results found for: Kandis Cocking, IGMSERUM Lab Results  Component Value Date   TOTALPROTELP 6.7 07/14/2011   ALBUMINELP 56.6 07/14/2011   A1GS 5.7 (H) 07/14/2011   A2GS 12.7 (H) 07/14/2011   BETS 4.9 07/14/2011   BETA2SER 3.8 07/14/2011   GAMS 16.3 07/14/2011   MSPIKE NOT DET 07/14/2011   SPEI * 07/14/2011     Chemistry      Component Value Date/Time   NA 140 11/16/2015 0830   K 4.7 11/16/2015 0830   CL 106 04/07/2015 1054   CL 101 11/13/2013 0933   CO2 25 11/16/2015 0830   BUN 52.9 (H) 11/16/2015 0830   CREATININE 2.4 (H) 11/16/2015 0830      Component Value Date/Time   CALCIUM 9.3 11/16/2015 0830   ALKPHOS 97 11/16/2015 0830   AST 16 11/16/2015 0830   ALT <9 11/16/2015 0830   BILITOT 0.47 11/16/2015 0830     Impression and Plan: Larry [name] is a very pleasant 75 yo male with hypotestosteronemia. He also has a history of splenic marginal zone lymphoma. He had a splenectomy in 2009 and completed 1 cycle of Rituxan in April 2010. So far, there has been no evidence of recurrence. He is doing well at this time.  He will continue to receive testosterone injections monthly. His last level was 392. Level today is pending.  We will plan to see him back in 3 months for labs and follow-up. He will contact our office with any questions or concerns. We can certainly see him sooner if need be.   Volanda Napoleon, MD 10/17/201711:13 AM

## 2016-02-15 NOTE — Patient Instructions (Signed)

## 2016-02-16 LAB — TESTOSTERONE: Testosterone, Serum: 273 ng/dL (ref 264–916)

## 2016-02-17 DIAGNOSIS — R011 Cardiac murmur, unspecified: Secondary | ICD-10-CM | POA: Diagnosis not present

## 2016-02-17 DIAGNOSIS — I10 Essential (primary) hypertension: Secondary | ICD-10-CM | POA: Diagnosis not present

## 2016-02-17 DIAGNOSIS — E039 Hypothyroidism, unspecified: Secondary | ICD-10-CM | POA: Diagnosis not present

## 2016-02-17 DIAGNOSIS — N189 Chronic kidney disease, unspecified: Secondary | ICD-10-CM | POA: Diagnosis not present

## 2016-02-17 DIAGNOSIS — E1121 Type 2 diabetes mellitus with diabetic nephropathy: Secondary | ICD-10-CM | POA: Diagnosis not present

## 2016-03-02 DIAGNOSIS — N183 Chronic kidney disease, stage 3 (moderate): Secondary | ICD-10-CM | POA: Diagnosis not present

## 2016-03-07 ENCOUNTER — Encounter (HOSPITAL_COMMUNITY): Payer: Self-pay | Admitting: Emergency Medicine

## 2016-03-07 ENCOUNTER — Inpatient Hospital Stay (HOSPITAL_COMMUNITY)
Admission: EM | Admit: 2016-03-07 | Discharge: 2016-03-12 | DRG: 291 | Disposition: A | Discharge status: Home Health | Payer: Medicare Other | Attending: Internal Medicine | Admitting: Internal Medicine

## 2016-03-07 ENCOUNTER — Emergency Department (HOSPITAL_COMMUNITY): Payer: Medicare Other

## 2016-03-07 DIAGNOSIS — R778 Other specified abnormalities of plasma proteins: Secondary | ICD-10-CM

## 2016-03-07 DIAGNOSIS — I509 Heart failure, unspecified: Secondary | ICD-10-CM

## 2016-03-07 DIAGNOSIS — I5031 Acute diastolic (congestive) heart failure: Secondary | ICD-10-CM | POA: Diagnosis not present

## 2016-03-07 DIAGNOSIS — R600 Localized edema: Secondary | ICD-10-CM | POA: Diagnosis not present

## 2016-03-07 DIAGNOSIS — L03221 Cellulitis of neck: Secondary | ICD-10-CM | POA: Diagnosis not present

## 2016-03-07 DIAGNOSIS — I248 Other forms of acute ischemic heart disease: Secondary | ICD-10-CM | POA: Diagnosis present

## 2016-03-07 DIAGNOSIS — Z79899 Other long term (current) drug therapy: Secondary | ICD-10-CM

## 2016-03-07 DIAGNOSIS — R6889 Other general symptoms and signs: Secondary | ICD-10-CM | POA: Diagnosis not present

## 2016-03-07 DIAGNOSIS — E291 Testicular hypofunction: Secondary | ICD-10-CM | POA: Diagnosis present

## 2016-03-07 DIAGNOSIS — M109 Gout, unspecified: Secondary | ICD-10-CM | POA: Diagnosis present

## 2016-03-07 DIAGNOSIS — R0902 Hypoxemia: Secondary | ICD-10-CM | POA: Diagnosis present

## 2016-03-07 DIAGNOSIS — I10 Essential (primary) hypertension: Secondary | ICD-10-CM | POA: Diagnosis present

## 2016-03-07 DIAGNOSIS — N184 Chronic kidney disease, stage 4 (severe): Secondary | ICD-10-CM | POA: Diagnosis present

## 2016-03-07 DIAGNOSIS — R531 Weakness: Secondary | ICD-10-CM | POA: Diagnosis not present

## 2016-03-07 DIAGNOSIS — Z7984 Long term (current) use of oral hypoglycemic drugs: Secondary | ICD-10-CM

## 2016-03-07 DIAGNOSIS — I130 Hypertensive heart and chronic kidney disease with heart failure and stage 1 through stage 4 chronic kidney disease, or unspecified chronic kidney disease: Principal | ICD-10-CM | POA: Diagnosis present

## 2016-03-07 DIAGNOSIS — R509 Fever, unspecified: Secondary | ICD-10-CM

## 2016-03-07 DIAGNOSIS — N183 Chronic kidney disease, stage 3 (moderate): Secondary | ICD-10-CM

## 2016-03-07 DIAGNOSIS — R748 Abnormal levels of other serum enzymes: Secondary | ICD-10-CM | POA: Diagnosis not present

## 2016-03-07 DIAGNOSIS — Z9081 Acquired absence of spleen: Secondary | ICD-10-CM

## 2016-03-07 DIAGNOSIS — N2581 Secondary hyperparathyroidism of renal origin: Secondary | ICD-10-CM | POA: Diagnosis not present

## 2016-03-07 DIAGNOSIS — N049 Nephrotic syndrome with unspecified morphologic changes: Secondary | ICD-10-CM | POA: Diagnosis present

## 2016-03-07 DIAGNOSIS — L0213 Carbuncle of neck: Secondary | ICD-10-CM | POA: Diagnosis present

## 2016-03-07 DIAGNOSIS — Z87442 Personal history of urinary calculi: Secondary | ICD-10-CM

## 2016-03-07 DIAGNOSIS — E1122 Type 2 diabetes mellitus with diabetic chronic kidney disease: Secondary | ICD-10-CM | POA: Diagnosis present

## 2016-03-07 DIAGNOSIS — L8990 Pressure ulcer of unspecified site, unspecified stage: Secondary | ICD-10-CM | POA: Insufficient documentation

## 2016-03-07 DIAGNOSIS — N189 Chronic kidney disease, unspecified: Secondary | ICD-10-CM | POA: Diagnosis not present

## 2016-03-07 DIAGNOSIS — Z8572 Personal history of non-Hodgkin lymphomas: Secondary | ICD-10-CM | POA: Diagnosis not present

## 2016-03-07 DIAGNOSIS — E039 Hypothyroidism, unspecified: Secondary | ICD-10-CM | POA: Diagnosis present

## 2016-03-07 DIAGNOSIS — N179 Acute kidney failure, unspecified: Secondary | ICD-10-CM | POA: Diagnosis not present

## 2016-03-07 DIAGNOSIS — K219 Gastro-esophageal reflux disease without esophagitis: Secondary | ICD-10-CM | POA: Diagnosis present

## 2016-03-07 DIAGNOSIS — E1129 Type 2 diabetes mellitus with other diabetic kidney complication: Secondary | ICD-10-CM | POA: Diagnosis not present

## 2016-03-07 DIAGNOSIS — N170 Acute kidney failure with tubular necrosis: Secondary | ICD-10-CM | POA: Diagnosis present

## 2016-03-07 DIAGNOSIS — R7989 Other specified abnormal findings of blood chemistry: Secondary | ICD-10-CM

## 2016-03-07 DIAGNOSIS — L0292 Furuncle, unspecified: Secondary | ICD-10-CM

## 2016-03-07 DIAGNOSIS — E1121 Type 2 diabetes mellitus with diabetic nephropathy: Secondary | ICD-10-CM

## 2016-03-07 DIAGNOSIS — I110 Hypertensive heart disease with heart failure: Secondary | ICD-10-CM | POA: Diagnosis not present

## 2016-03-07 DIAGNOSIS — E119 Type 2 diabetes mellitus without complications: Secondary | ICD-10-CM

## 2016-03-07 HISTORY — DX: Hypothyroidism, unspecified: E03.9

## 2016-03-07 HISTORY — DX: Personal history of urinary calculi: Z87.442

## 2016-03-07 HISTORY — DX: Type 2 diabetes mellitus without complications: E11.9

## 2016-03-07 HISTORY — DX: Non-Hodgkin lymphoma, unspecified, unspecified site: C85.90

## 2016-03-07 HISTORY — DX: Gastro-esophageal reflux disease without esophagitis: K21.9

## 2016-03-07 HISTORY — DX: Gout, unspecified: M10.9

## 2016-03-07 HISTORY — DX: Chronic kidney disease, unspecified: N18.9

## 2016-03-07 LAB — URINALYSIS, ROUTINE W REFLEX MICROSCOPIC
BILIRUBIN URINE: NEGATIVE
Glucose, UA: NEGATIVE mg/dL
Ketones, ur: NEGATIVE mg/dL
Leukocytes, UA: NEGATIVE
NITRITE: NEGATIVE
Protein, ur: >300 mg/dL — AB
SPECIFIC GRAVITY, URINE: 1.015 (ref 1.005–1.030)
pH: 6 (ref 5.0–8.0)

## 2016-03-07 LAB — CBC WITH DIFFERENTIAL/PLATELET
Basophils Absolute: 0 10*3/uL (ref 0.0–0.1)
Basophils Relative: 0 %
EOS ABS: 0 10*3/uL (ref 0.0–0.7)
Eosinophils Relative: 0 %
HCT: 27.2 % — ABNORMAL LOW (ref 39.0–52.0)
HEMOGLOBIN: 9.3 g/dL — AB (ref 13.0–17.0)
LYMPHS ABS: 1.1 10*3/uL (ref 0.7–4.0)
Lymphocytes Relative: 12 %
MCH: 34.1 pg — AB (ref 26.0–34.0)
MCHC: 34.2 g/dL (ref 30.0–36.0)
MCV: 99.6 fL (ref 78.0–100.0)
Monocytes Absolute: 0.9 10*3/uL (ref 0.1–1.0)
Monocytes Relative: 10 %
NEUTROS PCT: 78 %
Neutro Abs: 6.9 10*3/uL (ref 1.7–7.7)
Platelets: 189 10*3/uL (ref 150–400)
RBC: 2.73 MIL/uL — AB (ref 4.22–5.81)
RDW: 14.7 % (ref 11.5–15.5)
WBC: 8.9 10*3/uL (ref 4.0–10.5)

## 2016-03-07 LAB — COMPREHENSIVE METABOLIC PANEL
ALBUMIN: 3.1 g/dL — AB (ref 3.5–5.0)
ALK PHOS: 81 U/L (ref 38–126)
ALT: 14 U/L — AB (ref 17–63)
AST: 16 U/L (ref 15–41)
Anion gap: 7 (ref 5–15)
BUN: 38 mg/dL — ABNORMAL HIGH (ref 6–20)
CALCIUM: 8.8 mg/dL — AB (ref 8.9–10.3)
CO2: 23 mmol/L (ref 22–32)
CREATININE: 2.99 mg/dL — AB (ref 0.61–1.24)
Chloride: 107 mmol/L (ref 101–111)
GFR calc non Af Amer: 19 mL/min — ABNORMAL LOW (ref >60)
GFR, EST AFRICAN AMERICAN: 22 mL/min — AB (ref >60)
GLUCOSE: 157 mg/dL — AB (ref 65–99)
Potassium: 5 mmol/L (ref 3.5–5.1)
SODIUM: 137 mmol/L (ref 135–145)
Total Bilirubin: 1.1 mg/dL (ref 0.3–1.2)
Total Protein: 6.3 g/dL — ABNORMAL LOW (ref 6.5–8.1)

## 2016-03-07 LAB — URINE MICROSCOPIC-ADD ON

## 2016-03-07 LAB — BRAIN NATRIURETIC PEPTIDE: B Natriuretic Peptide: 488.9 pg/mL — ABNORMAL HIGH (ref 0.0–100.0)

## 2016-03-07 LAB — CBG MONITORING, ED: GLUCOSE-CAPILLARY: 136 mg/dL — AB (ref 65–99)

## 2016-03-07 LAB — TROPONIN I
TROPONIN I: 0.13 ng/mL — AB (ref <0.03)
Troponin I: 0.08 ng/mL (ref <0.03)

## 2016-03-07 LAB — GLUCOSE, CAPILLARY: Glucose-Capillary: 170 mg/dL — ABNORMAL HIGH (ref 65–99)

## 2016-03-07 LAB — LACTIC ACID, PLASMA: LACTIC ACID, VENOUS: 0.8 mmol/L (ref 0.5–1.9)

## 2016-03-07 LAB — INFLUENZA PANEL BY PCR (TYPE A & B)
Influenza A By PCR: NEGATIVE
Influenza B By PCR: NEGATIVE

## 2016-03-07 MED ORDER — ACETAMINOPHEN 325 MG PO TABS
650.0000 mg | ORAL_TABLET | Freq: Once | ORAL | Status: AC
  Administered 2016-03-07: 650 mg via ORAL
  Filled 2016-03-07: qty 2

## 2016-03-07 MED ORDER — DOXAZOSIN MESYLATE 8 MG PO TABS
8.0000 mg | ORAL_TABLET | Freq: Two times a day (BID) | ORAL | Status: DC
  Administered 2016-03-07 – 2016-03-12 (×10): 8 mg via ORAL
  Filled 2016-03-07 (×11): qty 1

## 2016-03-07 MED ORDER — ONDANSETRON HCL 4 MG/2ML IJ SOLN: 4.0000 mg | Freq: Four times a day (QID) | INTRAMUSCULAR | Status: DC | PRN

## 2016-03-07 MED ORDER — HEPARIN SODIUM (PORCINE) 5000 UNIT/ML IJ SOLN
5000.0000 [IU] | Freq: Three times a day (TID) | INTRAMUSCULAR | Status: DC
  Administered 2016-03-07 – 2016-03-12 (×14): 5000 [IU] via SUBCUTANEOUS
  Filled 2016-03-07 (×14): qty 1

## 2016-03-07 MED ORDER — PANTOPRAZOLE SODIUM 40 MG PO TBEC
40.0000 mg | DELAYED_RELEASE_TABLET | Freq: Every day | ORAL | Status: DC
  Administered 2016-03-07 – 2016-03-12 (×6): 40 mg via ORAL
  Filled 2016-03-07 (×7): qty 1

## 2016-03-07 MED ORDER — SODIUM CHLORIDE 0.9 % IV BOLUS (SEPSIS)
250.0000 mL | Freq: Once | INTRAVENOUS | Status: AC
  Administered 2016-03-07: 250 mL via INTRAVENOUS

## 2016-03-07 MED ORDER — SODIUM CHLORIDE 0.9% FLUSH
3.0000 mL | INTRAVENOUS | Status: DC | PRN
  Administered 2016-03-10 – 2016-03-11 (×2): 3 mL via INTRAVENOUS
  Filled 2016-03-07 (×2): qty 3

## 2016-03-07 MED ORDER — AMLODIPINE BESYLATE 10 MG PO TABS
10.0000 mg | ORAL_TABLET | Freq: Every day | ORAL | Status: DC
  Administered 2016-03-07 – 2016-03-12 (×6): 10 mg via ORAL
  Filled 2016-03-07 (×6): qty 1

## 2016-03-07 MED ORDER — LEVOTHYROXINE SODIUM 75 MCG PO TABS
175.0000 ug | ORAL_TABLET | Freq: Every day | ORAL | Status: DC
  Administered 2016-03-08 – 2016-03-12 (×5): 175 ug via ORAL
  Filled 2016-03-07 (×5): qty 1

## 2016-03-07 MED ORDER — OSELTAMIVIR PHOSPHATE 30 MG PO CAPS
30.0000 mg | ORAL_CAPSULE | Freq: Once | ORAL | Status: AC
  Administered 2016-03-07: 30 mg via ORAL
  Filled 2016-03-07: qty 1

## 2016-03-07 MED ORDER — ACETAMINOPHEN 325 MG PO TABS
650.0000 mg | ORAL_TABLET | ORAL | Status: DC | PRN
  Administered 2016-03-08: 650 mg via ORAL
  Filled 2016-03-07: qty 2

## 2016-03-07 MED ORDER — SODIUM CHLORIDE 0.9% FLUSH
3.0000 mL | Freq: Two times a day (BID) | INTRAVENOUS | Status: DC
  Administered 2016-03-07 – 2016-03-12 (×8): 3 mL via INTRAVENOUS

## 2016-03-07 MED ORDER — SODIUM CHLORIDE 0.9 % IV SOLN: 250.0000 mL | INTRAVENOUS | Status: DC | PRN

## 2016-03-07 MED ORDER — FUROSEMIDE 10 MG/ML IJ SOLN
60.0000 mg | Freq: Once | INTRAMUSCULAR | Status: AC
  Administered 2016-03-07: 60 mg via INTRAVENOUS
  Filled 2016-03-07: qty 6

## 2016-03-07 MED ORDER — HYDRALAZINE HCL 20 MG/ML IJ SOLN
10.0000 mg | INTRAMUSCULAR | Status: DC | PRN
  Administered 2016-03-07: 10 mg via INTRAVENOUS
  Filled 2016-03-07: qty 1

## 2016-03-07 MED ORDER — ASPIRIN EC 81 MG PO TBEC
81.0000 mg | DELAYED_RELEASE_TABLET | Freq: Every day | ORAL | Status: DC
  Administered 2016-03-07 – 2016-03-12 (×6): 81 mg via ORAL
  Filled 2016-03-07 (×6): qty 1

## 2016-03-07 MED ORDER — INSULIN ASPART 100 UNIT/ML ~~LOC~~ SOLN
0.0000 [IU] | Freq: Three times a day (TID) | SUBCUTANEOUS | Status: DC
  Administered 2016-03-08 (×3): 1 [IU] via SUBCUTANEOUS

## 2016-03-07 MED ORDER — LINAGLIPTIN 5 MG PO TABS
5.0000 mg | ORAL_TABLET | Freq: Every day | ORAL | Status: DC
  Administered 2016-03-07 – 2016-03-12 (×6): 5 mg via ORAL
  Filled 2016-03-07 (×6): qty 1

## 2016-03-07 MED ORDER — ALLOPURINOL 100 MG PO TABS
100.0000 mg | ORAL_TABLET | Freq: Every day | ORAL | Status: DC
  Administered 2016-03-07 – 2016-03-12 (×6): 100 mg via ORAL
  Filled 2016-03-07 (×6): qty 1

## 2016-03-07 NOTE — ED Notes (Signed)
Pt to xray at this time.

## 2016-03-07 NOTE — ED Provider Notes (Signed)
[facility] DEPT Provider Note   CSN: [id number] Arrival date & time: 03/07/16  1311     History   Chief Complaint Chief Complaint  Patient presents with  . Fatigue  . Leg Swelling    HPI Larry [name] is a 75 y.o. male.  HPI Patient sent to the emergency department from the [facility] walking clinic after he was found to have riders and generalized weakness over the past several days as well as increasing lower extremity swelling and scrotal edema.  He does have a known history of diabetes and congestive heart failure.  He reports productive cough with shortness of breath.  Denies abdominal pain.  Reports no nausea vomiting or diarrhea.  He is found to have fever on arrival to the emergency department.  He does not complain of focal weakness.  No active chest pain this time.  Symptoms are moderate in severity.  Patient found to be hypoxic to 90% on arrival.   Past Medical History:  Diagnosis Date  . Cancer ([place])    Non Hodgkins Lymphoma  . Diabetes mellitus 5-6 yrs   T2DM  . Hernia 1949  . Hyperkalemia   . Hypertension   . Hypotestosteronism 03/29/2011  . 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 Thyroid disease     Patient Active Problem List   Diagnosis Date Noted  . Urinary retention s/p foley x2 08/19/2011  . Acute renal failure ([place]) 08/19/2011  . Chronic renal insufficiency, stage II (mild) 08/19/2011  . Duodenal ulcer, perforated, s/p Omental patch [id number] 08/11/2011  . 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 Hypotestosteronism 03/29/2011  . DIABETES, TYPE 2 08/28/2007  . HYPERTENSION 08/28/2007  . ALLERGIC RHINITIS 08/28/2007  . PLEURAL EFFUSION, LEFT 08/28/2007    Past Surgical History:  Procedure Laterality Date  . APPENDECTOMY  1948  . HERNIA REPAIR  1949   unknown what type of hernia  . LAPAROTOMY  08/11/2011   Procedure: EXPLORATORY LAPAROTOMY;  Surgeon: Zenovia Jarred, MD;  Location: [facility];  Service:  General;  Laterality: N/A;  . PANCREATECTOMY  2009   Partial w/ splenectomy  . SPLENECTOMY, TOTAL  2009  . TONSILLECTOMY         Home Medications    Prior to Admission medications   Medication Sig Start Date End Date Taking? Authorizing Provider  allopurinol (ZYLOPRIM) 100 MG tablet Take 100 mg by mouth daily.  10/20/14  Yes Historical Provider, MD  amLODipine (NORVASC) 10 MG tablet Take 10 mg by mouth daily.     Yes Historical Provider, MD  doxazosin (CARDURA) 4 MG tablet Take 8 mg by mouth 2 (two) times daily. 2 tabs twice daily 10/20/14  Yes Historical Provider, MD  furosemide (LASIX) 40 MG tablet Take 40 mg by mouth daily.    Yes Historical Provider, MD  levothyroxine (SYNTHROID, LEVOTHROID) 175 MCG tablet Take 175 mcg by mouth daily before breakfast.   Yes Historical Provider, MD  omeprazole (PRILOSEC) 20 MG capsule Take 20 mg by mouth 2 (two) times daily.  11/27/11  Yes Historical Provider, MD  sitaGLIPtin (JANUVIA) 50 MG tablet Take 50 mg by mouth daily.   Yes Historical Provider, MD  levothyroxine (SYNTHROID, LEVOTHROID) 150 MCG tablet Take 150 mcg by mouth daily.      Historical Provider, MD  triamcinolone ointment (KENALOG) 0.1 % Apply 1 application topically daily as needed (for irritation).  05/09/15   Historical Provider, MD    Family History History reviewed. No pertinent family  history.  Social History Social History  Substance Use Topics  . Smoking status: Never Smoker  . Smokeless tobacco: Never Used     Comment: Never used tobacco  . Alcohol use No     Allergies   Iohexol   Review of Systems Review of Systems  All other systems reviewed and are negative.    Physical Exam Updated Vital Signs BP 155/83   Pulse 89   Temp 101.6 F (38.7 C) (Rectal)   Resp 25   SpO2 94%   Physical Exam  Constitutional: He is oriented to person, place, and time. He appears well-developed and well-nourished.  HENT:  Head: Normocephalic and atraumatic.  Eyes: EOM are  normal.  Neck: Normal range of motion.  Cardiovascular: Normal rate, regular rhythm, normal heart sounds and intact distal pulses.   Pulmonary/Chest: Effort normal. He has no wheezes. He has rales.  Abdominal: Soft. He exhibits no distension. There is no tenderness.  Genitourinary:  Genitourinary Comments: Significant scrotal edema  Musculoskeletal: Normal range of motion.  Neurological: He is alert and oriented to person, place, and time.  Skin: Skin is warm and dry.  Psychiatric: He has a normal mood and affect. Judgment normal.  Nursing note and vitals reviewed.    ED Treatments / Results  Labs (all labs ordered are listed, but only abnormal results are displayed) Labs Reviewed  CBC WITH DIFFERENTIAL/PLATELET - Abnormal; Notable for the following:       Result Value   RBC 2.73 (*)    Hemoglobin 9.3 (*)    HCT 27.2 (*)    MCH 34.1 (*)    All other components within normal limits  COMPREHENSIVE METABOLIC PANEL - Abnormal; Notable for the following:    Glucose, Bld 157 (*)    BUN 38 (*)    Creatinine, Ser 2.99 (*)    Calcium 8.8 (*)    Total Protein 6.3 (*)    Albumin 3.1 (*)    ALT 14 (*)    GFR calc non Af Amer 19 (*)    GFR calc Af Amer 22 (*)    All other components within normal limits  TROPONIN I - Abnormal; Notable for the following:    Troponin I 0.08 (*)    All other components within normal limits  URINALYSIS, ROUTINE W REFLEX MICROSCOPIC (NOT AT [facility]) - Abnormal; Notable for the following:    Hgb urine dipstick SMALL (*)    Protein, ur >300 (*)    All other components within normal limits  URINE MICROSCOPIC-ADD ON - Abnormal; Notable for the following:    Squamous Epithelial / LPF 0-5 (*)    Bacteria, UA FEW (*)    Casts GRANULAR CAST (*)    All other components within normal limits  CBG MONITORING, ED - Abnormal; Notable for the following:    Glucose-Capillary 136 (*)    All other components within normal limits  CULTURE, BLOOD (ROUTINE X 2)  CULTURE,  BLOOD (ROUTINE X 2)  URINE CULTURE  LACTIC ACID, PLASMA  BRAIN NATRIURETIC PEPTIDE    EKG  EKG Interpretation  Date/Time:  Tuesday March 07 2016 16:50:04 EST Ventricular Rate:  91 PR Interval:    QRS Duration: 79 QT Interval:  337 QTC Calculation: 415 R Axis:   81 Text Interpretation:  Sinus rhythm Borderline right axis deviation No significant change was found Confirmed by Rondle Lohse  MD, Lennette Bihari ([id number]) on 03/07/2016 4:53:55 PM       Radiology Dg Chest 2 View  Result Date: 03/07/2016 CLINICAL DATA:  Weakness and body aches for 3 days. Low grade fever. EXAM: CHEST  2 VIEW COMPARISON:  PA and lateral chest 04/07/2015.  CT chest 06/01/2010. FINDINGS: The patient has small to moderate bilateral pleural effusions, larger on the left. Indistinctness of the pulmonary vasculature is present bilaterally with more focal airspace opacities in the lung bases, worse on the left. No pneumothorax. There is mild cardiomegaly. IMPRESSION: Findings most compatible with congestive heart failure with associated small to moderate bilateral pleural effusions. Electronically Signed   By: Inge Rise M.D.   On: 03/07/2016 15:57    Procedures Procedures (including critical care time)  Medications Ordered in ED Medications  sodium chloride 0.9 % bolus 250 mL (0 mLs Intravenous Stopped 03/07/16 1630)  acetaminophen (TYLENOL) tablet 650 mg (650 mg Oral Given 03/07/16 1642)  furosemide (LASIX) injection 60 mg (60 mg Intravenous Given 03/07/16 1642)     Initial Impression / Assessment and Plan / ED Course  I have reviewed the triage vital signs and the nursing notes.  Pertinent labs & imaging results that were available during my care of the patient were reviewed by me and considered in my medical decision making (see chart for details).  Clinical Course     Patient be admitted for acute on chronic congestive heart failure.  Unclear etiology of the fever.  No productive cough.  No pneumonia noted.   Blood and urine cultures obtained.  Influenza will be added.  Patient be admitted to telemetry.  No active chest pain at this time.  Doubt ACS.  Final Clinical Impressions(s) / ED Diagnoses   Final diagnoses:  Acute congestive heart failure, unspecified congestive heart failure type ([place])  Fever, unspecified fever cause  Weakness    New Prescriptions New Prescriptions   No medications on file     Jola Schmidt, MD 03/07/16 1656

## 2016-03-07 NOTE — ED Notes (Signed)
Checked patient blood sugar it was 136 notified RN Holley of blood sugar

## 2016-03-07 NOTE — ED Triage Notes (Signed)
Pt to ER BIB [facility] from [place] for further evaluation of generalized weakness and fatigue x2-3 days. Pt is 90% on RA, tachypneic, with significant swelling to bilateral lower extremities, reports "it's not worse than usual." per EMS patient is CHF patient, patient reports "this is new." A/O x4.

## 2016-03-07 NOTE — ED Notes (Signed)
Critical Troponin 0.08. MD [facility] notified.

## 2016-03-07 NOTE — H&P (Signed)
TRH H&P   Patient Demographics:    Larry [name], is a 75 y.o. male  MRN: [id number]   DOB - [birth year]  Admit Date - 03/07/2016  Outpatient Primary MD for the patient is Harvie Junior, MD  Referring MD/NP/PA: Dr. Venora Maples  Outpatient Specialists: Renal Dr. Florene Glen, oncology Dr. Marin Olp  Patient coming from: Home  Chief Complaint  Patient presents with  . Fatigue  . Leg Swelling      HPI:    Larry [name]  is a 76 y.o. male, With past medical history of GERD, hypothyroidism, diabetes mellitus, non-Hodgkin lymphoma, hypertension, hypotension, Hypotestosteronism, patient presents with complaints of generalized weakness and fatigue over last 3 days, poor appetite, fever and chills, cough over last 24 hours, nonproductive, and exertional dyspnea, as well reports worsening lower extremity edema over the last week, patient Lasix dose was recently decreased by Dr. Florene Glen after labs were done(from Lasix 40 mg twice a day to once daily), patient denies any chest pain, productive sputum, hemoptysis, nausea or vomiting, diarrhea, abdominal pain. - in ED workup was significant for elevated troponin at 0.08, EKG nonacute, worsening renal function with a creatinine of 2.99, baseline is 2.6, chest x-ray with no opacity, but evidence of bilateral pleural effusion and vascular congestion, negative urinalysis, will leukocytosis, patient was given IV Lasix and fluid bolus in ED. And I was called to admit.    Review of systems:    In addition to the HPI above, Reports fever and chills at home No Headache, No changes with Vision or hearing, No problems swallowing food or Liquids, No Chest pain, Cough or shortness of breath on exertion No Abdominal pain, No Nausea or Vommitting, Bowel movements are regular, No Blood in stool or Urine, No dysuria, No new skin rashes or bruises, No new joints  pains-aches, report generalized body ache No new weakness, tingling, numbness in any extremity, for generalized weakness and fatigue No recent weight gain or loss, No polyuria, polydypsia or polyphagia, No significant Mental Stressors.  A full 10 point Review of Systems was done, except as stated above, all other Review of Systems were negative.   With Past History of the following :    Past Medical History:  Diagnosis Date  . Cancer ([place])    Non Hodgkins Lymphoma  . Diabetes mellitus 5-6 yrs   T2DM  . Hernia 1949  . Hyperkalemia   . Hypertension   . Hypotestosteronism 03/29/2011  . 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 Thyroid disease       Past Surgical History:  Procedure Laterality Date  . APPENDECTOMY  1948  . HERNIA REPAIR  1949   unknown what type of hernia  . LAPAROTOMY  08/11/2011   Procedure: EXPLORATORY LAPAROTOMY;  Surgeon: Zenovia Jarred, MD;  Location: [facility];  Service: General;  Laterality: N/A;  . PANCREATECTOMY  2009   Partial  w/ splenectomy  . SPLENECTOMY, TOTAL  2009  . TONSILLECTOMY        Social History:     Social History  Substance Use Topics  . Smoking status: Never Smoker  . Smokeless tobacco: Never Used     Comment: Never used tobacco  . Alcohol use No     Lives - At home  Mobility - mainly independent, occasionally with a walker     Family History :    History reviewed. No pertinent family history.    Home Medications:   Prior to Admission medications   Medication Sig Start Date End Date Taking? Authorizing Provider  allopurinol (ZYLOPRIM) 100 MG tablet Take 100 mg by mouth daily.  10/20/14  Yes Historical Provider, MD  amLODipine (NORVASC) 10 MG tablet Take 10 mg by mouth daily.     Yes Historical Provider, MD  doxazosin (CARDURA) 4 MG tablet Take 8 mg by mouth 2 (two) times daily. 2 tabs twice daily 10/20/14  Yes Historical Provider, MD  furosemide (LASIX) 40 MG tablet Take 40 mg by mouth daily.    Yes  Historical Provider, MD  levothyroxine (SYNTHROID, LEVOTHROID) 175 MCG tablet Take 175 mcg by mouth daily before breakfast.   Yes Historical Provider, MD  omeprazole (PRILOSEC) 20 MG capsule Take 20 mg by mouth 2 (two) times daily.  11/27/11  Yes Historical Provider, MD  sitaGLIPtin (JANUVIA) 50 MG tablet Take 50 mg by mouth daily.   Yes Historical Provider, MD  levothyroxine (SYNTHROID, LEVOTHROID) 150 MCG tablet Take 150 mcg by mouth daily.      Historical Provider, MD  triamcinolone ointment (KENALOG) 0.1 % Apply 1 application topically daily as needed (for irritation).  05/09/15   Historical Provider, MD     Allergies:     Allergies  Allergen Reactions  . Iohexol Other (See Comments)    Pt states has history of reaction in the past, can not remember what exactly happened, but was told by the dr never to have the contrast again.     Physical Exam:   Vitals  Blood pressure 155/83, pulse 89, temperature 101.6 F (38.7 C), temperature source Rectal, resp. rate 25, SpO2 94 %.   1. General Frail, ill-appearing male lying in bed in NAD,    2. Normal affect and insight, Not Suicidal or Homicidal, Awake Alert, Oriented X 3.  3. No F.N deficits, ALL C.Nerves Intact, Strength 5/5 all 4 extremities, Sensation intact all 4 extremities, Plantars down going.  4. Ears and Eyes appear Normal, Conjunctivae clear, PERRLA. Moist Oral Mucosa.  5. Supple Neck, No JVD, No Carotid Bruits, +3 edema bilaterally up to quit  6. Symmetrical Chest wall movement, decreased air entry at the bases, no respiratory distress, no wheezing  7. RRR, No Gallops, Rubs or Murmurs, No Parasternal Heave.  8. Positive Bowel Sounds, Abdomen Soft, No tenderness, No organomegaly appriciated,No rebound -guarding or rigidity.  9.  No Cyanosis, Normal Skin Turgor, chronic lower extremity skin changes with erythema(as per patient at baseline), and few pimples and chest and neck area(at baseline per patient and wife)  10.  Good muscle tone,  joints appear normal , no effusions, Normal ROM.     Data Review:    CBC  Recent Labs Lab 03/07/16 1436  WBC 8.9  HGB 9.3*  HCT 27.2*  PLT 189  MCV 99.6  MCH 34.1*  MCHC 34.2  RDW 14.7  LYMPHSABS 1.1  MONOABS 0.9  EOSABS 0.0  BASOSABS 0.0   ------------------------------------------------------------------------------------------------------------------  Chemistries   Recent Labs Lab 03/07/16 1436  NA 137  K 5.0  CL 107  CO2 23  GLUCOSE 157*  BUN 38*  CREATININE 2.99*  CALCIUM 8.8*  AST 16  ALT 14*  ALKPHOS 81  BILITOT 1.1   ------------------------------------------------------------------------------------------------------------------ CrCl cannot be calculated (Unknown ideal weight.). ------------------------------------------------------------------------------------------------------------------ No results for input(s): TSH, T4TOTAL, T3FREE, THYROIDAB in the last 72 hours.  Invalid input(s): FREET3  Coagulation profile No results for input(s): INR, PROTIME in the last 168 hours. ------------------------------------------------------------------------------------------------------------------- No results for input(s): DDIMER in the last 72 hours. -------------------------------------------------------------------------------------------------------------------  Cardiac Enzymes  Recent Labs Lab 03/07/16 1436  TROPONINI 0.08*   ------------------------------------------------------------------------------------------------------------------ No results found for: BNP   ---------------------------------------------------------------------------------------------------------------  Urinalysis    Component Value Date/Time   COLORURINE YELLOW 03/07/2016 1535   APPEARANCEUR CLEAR 03/07/2016 1535   LABSPEC 1.015 03/07/2016 1535   PHURINE 6.0 03/07/2016 1535   GLUCOSEU NEGATIVE 03/07/2016 1535   HGBUR SMALL (A) 03/07/2016 1535    BILIRUBINUR NEGATIVE 03/07/2016 1535   KETONESUR NEGATIVE 03/07/2016 1535   PROTEINUR >300 (A) 03/07/2016 1535   UROBILINOGEN 0.2 08/11/2011 1050   NITRITE NEGATIVE 03/07/2016 1535   LEUKOCYTESUR NEGATIVE 03/07/2016 1535    ----------------------------------------------------------------------------------------------------------------   Imaging Results:    Dg Chest 2 View  Result Date: 03/07/2016 CLINICAL DATA:  Weakness and body aches for 3 days. Low grade fever. EXAM: CHEST  2 VIEW COMPARISON:  PA and lateral chest 04/07/2015.  CT chest 06/01/2010. FINDINGS: The patient has small to moderate bilateral pleural effusions, larger on the left. Indistinctness of the pulmonary vasculature is present bilaterally with more focal airspace opacities in the lung bases, worse on the left. No pneumothorax. There is mild cardiomegaly. IMPRESSION: Findings most compatible with congestive heart failure with associated small to moderate bilateral pleural effusions. Electronically Signed   By: Inge Rise M.D.   On: 03/07/2016 15:57    My personal review of EKG: Rhythm NSR, Rate 82   /min, QTc 418 , no Acute ST changes   Assessment & Plan:    Active Problems:   Diabetes mellitus (HCC)   Essential hypertension   CHF (congestive heart failure) (HCC)   Elevated troponin   Acute kidney injury superimposed on CKD (HCC)   Fever  Acute weakness and debilitation - This is most likely related to acute CHF, and infectious process, will consult PT  Acute CHF exacerbation - Patient presents with worsening lower extremity edema, bilateral pleural effusion, and volume overload on chest x-ray, no echo and reports in the past. - This is most likely related to acute CHF exacerbation, will obtain 2-D echo, admitted under CHF pathway,, will monitor on telemetry, cycle cardiac enzymes, giving his fever and deconditioning, will hold on IV diuresis for now, no ACC I/ARB given her renal failure, no beta blocker  given acute decompensation. - As well this may be related to renal physician decreasing his Lasix recently in the outpatient setting. - CHF may be provoked by infectious process  Fever - Patient reports menorrhagia, generalized weakness, cough, nonproductive, most likely related to viral illness, chest x-ray with no pneumonia, urinalysis is negative, will send 2 sets of blood culture, will check influenza PCR, will give one dose Tamiflu pending results.  Positive troponins - Denies any chest pain, EKG nonacute, most likely in the setting of fever, renal failure and infection, will trend cardiac enzymes and monitor on telemetry  History of present illness on CK D stage III - Baseline creatinine 2.6, today is 2.99,  will monitor closely, will hold IV diuresis now given acute infection, will monitor closely and dose Lasix as needed  Hypertension - Uncontrolled, continue amlodipine, will start on when necessary hydralazine  Diabetes mellitus - Hold Januvia, continue with insulin sliding scale  GERD - History of peptic ulcer in the past, continue with PPI  History of lymphoma - In remission per patient, following with Dr. Marin Olp  Hypothyroidism - Continue with Synthroid, dose was recently increased by PCP from 150 g to 175 g  DVT Prophylaxis Heparin -  SCDs  AM Labs Ordered, also please review Full Orders  Family Communication: Admission, patients condition and plan of care including tests being ordered have been discussed with the patient and wife who indicate understanding and agree with the plan and Code Status.  Code Status Full  Likely DC to  Home/will consult PT  Condition GUARDED   Consults called: None  Admission status: Inpatient  Time spent in minutes : 60 minutes   Yisroel Mullendore M.D on 03/07/2016 at 5:23 PM  Between 7am to 7pm - Pager - [phone]. After 7pm go to [email or web address] - password [email or web address]  [facility] - Office  [phone]

## 2016-03-08 ENCOUNTER — Encounter (HOSPITAL_COMMUNITY): Payer: Self-pay | Admitting: Cardiology

## 2016-03-08 DIAGNOSIS — N184 Chronic kidney disease, stage 4 (severe): Secondary | ICD-10-CM

## 2016-03-08 DIAGNOSIS — R7989 Other specified abnormal findings of blood chemistry: Secondary | ICD-10-CM

## 2016-03-08 DIAGNOSIS — R509 Fever, unspecified: Secondary | ICD-10-CM

## 2016-03-08 DIAGNOSIS — N189 Chronic kidney disease, unspecified: Secondary | ICD-10-CM

## 2016-03-08 DIAGNOSIS — N179 Acute kidney failure, unspecified: Secondary | ICD-10-CM

## 2016-03-08 DIAGNOSIS — I509 Heart failure, unspecified: Secondary | ICD-10-CM

## 2016-03-08 DIAGNOSIS — L8990 Pressure ulcer of unspecified site, unspecified stage: Secondary | ICD-10-CM | POA: Insufficient documentation

## 2016-03-08 LAB — RESPIRATORY PANEL BY PCR
Adenovirus: NOT DETECTED
Bordetella pertussis: NOT DETECTED
CORONAVIRUS OC43-RVPPCR: NOT DETECTED
Chlamydophila pneumoniae: NOT DETECTED
Coronavirus 229E: NOT DETECTED
Coronavirus HKU1: NOT DETECTED
Coronavirus NL63: NOT DETECTED
INFLUENZA A-RVPPCR: NOT DETECTED
INFLUENZA B-RVPPCR: NOT DETECTED
METAPNEUMOVIRUS-RVPPCR: NOT DETECTED
Mycoplasma pneumoniae: NOT DETECTED
PARAINFLUENZA VIRUS 1-RVPPCR: NOT DETECTED
PARAINFLUENZA VIRUS 2-RVPPCR: NOT DETECTED
PARAINFLUENZA VIRUS 3-RVPPCR: NOT DETECTED
PARAINFLUENZA VIRUS 4-RVPPCR: NOT DETECTED
RESPIRATORY SYNCYTIAL VIRUS-RVPPCR: NOT DETECTED
RHINOVIRUS / ENTEROVIRUS - RVPPCR: NOT DETECTED

## 2016-03-08 LAB — BASIC METABOLIC PANEL
ANION GAP: 9 (ref 5–15)
BUN: 42 mg/dL — ABNORMAL HIGH (ref 6–20)
CHLORIDE: 104 mmol/L (ref 101–111)
CO2: 22 mmol/L (ref 22–32)
Calcium: 9 mg/dL (ref 8.9–10.3)
Creatinine, Ser: 3.12 mg/dL — ABNORMAL HIGH (ref 0.61–1.24)
GFR calc Af Amer: 21 mL/min — ABNORMAL LOW (ref >60)
GFR, EST NON AFRICAN AMERICAN: 18 mL/min — AB (ref >60)
GLUCOSE: 156 mg/dL — AB (ref 65–99)
POTASSIUM: 4.7 mmol/L (ref 3.5–5.1)
Sodium: 135 mmol/L (ref 135–145)

## 2016-03-08 LAB — URINE CULTURE

## 2016-03-08 LAB — GLUCOSE, CAPILLARY
GLUCOSE-CAPILLARY: 121 mg/dL — AB (ref 65–99)
Glucose-Capillary: 127 mg/dL — ABNORMAL HIGH (ref 65–99)
Glucose-Capillary: 128 mg/dL — ABNORMAL HIGH (ref 65–99)
Glucose-Capillary: 115 mg/dL — ABNORMAL HIGH (ref 65–99)

## 2016-03-08 LAB — IRON AND TIBC
IRON: 50 ug/dL (ref 45–182)
Saturation Ratios: 22 % (ref 17.9–39.5)
TIBC: 225 ug/dL — ABNORMAL LOW (ref 250–450)
UIBC: 175 ug/dL

## 2016-03-08 LAB — PROTEIN / CREATININE RATIO, URINE
CREATININE, URINE: 63.92 mg/dL
Protein Creatinine Ratio: 10.2 mg/mg{Cre} — ABNORMAL HIGH (ref 0.00–0.15)
Total Protein, Urine: 652 mg/dL

## 2016-03-08 LAB — PROCALCITONIN: Procalcitonin: <0.1 ng/mL

## 2016-03-08 LAB — TROPONIN I: Troponin I: 0.13 ng/mL (ref <0.03)

## 2016-03-08 LAB — TSH: TSH: 3.047 u[IU]/mL (ref 0.350–4.500)

## 2016-03-08 MED ORDER — FUROSEMIDE 10 MG/ML IJ SOLN
INTRAMUSCULAR | Status: AC
  Filled 2016-03-08: qty 6

## 2016-03-08 MED ORDER — ALBUTEROL SULFATE (2.5 MG/3ML) 0.083% IN NEBU
5.0000 mg | INHALATION_SOLUTION | Freq: Four times a day (QID) | RESPIRATORY_TRACT | Status: DC | PRN
  Administered 2016-03-08: 5 mg via RESPIRATORY_TRACT
  Filled 2016-03-08: qty 6

## 2016-03-08 MED ORDER — FUROSEMIDE 10 MG/ML IJ SOLN: 60.0000 mg | Freq: Two times a day (BID) | INTRAMUSCULAR | Status: DC

## 2016-03-08 MED ORDER — CEFAZOLIN IN D5W 1 GM/50ML IV SOLN
1.0000 g | Freq: Two times a day (BID) | INTRAVENOUS | Status: AC
  Administered 2016-03-08 – 2016-03-11 (×6): 1 g via INTRAVENOUS
  Filled 2016-03-08 (×7): qty 50

## 2016-03-08 MED ORDER — FUROSEMIDE 10 MG/ML IJ SOLN
60.0000 mg | Freq: Once | INTRAMUSCULAR | Status: AC
  Administered 2016-03-08: 60 mg via INTRAVENOUS

## 2016-03-08 MED ORDER — FUROSEMIDE 10 MG/ML IJ SOLN
80.0000 mg | Freq: Two times a day (BID) | INTRAMUSCULAR | Status: DC
  Administered 2016-03-08 – 2016-03-10 (×4): 80 mg via INTRAVENOUS
  Filled 2016-03-08 (×4): qty 8

## 2016-03-08 NOTE — Progress Notes (Signed)
   03/08/16 1200  Clinical Encounter Type  Visited With Patient and family together (Wife)  Visit Type Initial  Referral From Other (Comment) ([place] )  Spiritual Encounters  Spiritual Needs Emotional;Ritual  Stress Factors  Patient Stress Factors Loss;Lack of knowledge   - Introduced chaplaincy services. - Pt. Grieving loss of church community (church closed)  - Unpacked fears/loss - Pt. & wife struggling w/ the uncertainty of pt.'s medical condition.   - Wife & pt. likely each others only significant support (fear losing one another).  - Per pt. request, chaplain prayed for health and comfort.   Will follow, as needed.   - Rev. [place] MDiv ThM

## 2016-03-08 NOTE — Progress Notes (Signed)
CM following for DCP; physical therapist recommends SNF placement at discharge; Aneta Mins [phone]

## 2016-03-08 NOTE — Consult Note (Addendum)
Reason for Consult:Worsening Renal function  Referring Physician: Tat   Larry [name] is an 75 y.o. male with pmhx of GERD, hypothyroidism, diabetes mellitus, non-Hodgkin lymphoma, hypertension, CHF, and CKD4 presenting with fatigue, SOB, worsening lower extremity edema, non-productive cough and fever which started last Friday.  Patient was admitted and is being treated for CHF exacerbation as chest xray consistent with pulmonary edema, small pleural effusion with a BNP of 488.9. Patient was also noted to have slightly elevated troponin, however EKG without significant  ST elevation or depression.  Creatinine was 2.99. Has received only 2 doses of lasix, 60 mg - I think out of concern for renal function  With regard to his CKD, patient was last seen by Dr. Florene [name] on June 16th, 2017 for his stage IV CKD. At that time patient's creatinine was 2.37. Several months later on 02/17/2016, patient was seen at [facility] with SCr 2.76. On 03/02/16, on a pre-office visit lab, patient's serum creatinine was noted to 2.91. Dr. Florene [name] called pt regarding this increase in creatinine and was advised to decrease his furosemide 40 mg BID to once daily. (He did not mention to Dr. Florene [name] at that time that he was having progressive, SOB, weakness and more swelling)   He reports that his symptoms of weakness, progressive dyspnea, and worsening edema clearly PRECEDED the reduction in his furosemide dose. Denies chest pain.     Past Medical History:  Diagnosis Date  . Chronic kidney disease    "I see Larry [name]" (03/07/2016)  . GERD (gastroesophageal reflux disease)   . Gout   . Heart murmur   . History of kidney stones    "they passed" (03/07/2016)  . Hyperkalemia   . Hypertension   . Hypotestosteronism 03/29/2011  . Hypothyroidism   . Non Hodgkin's lymphoma ([place])   . 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 Thyroid disease   . Type II diabetes mellitus ([place]) dx'd ~ 2005    Past Surgical  History:  Procedure Laterality Date  . APPENDECTOMY  1948  . INGUINAL HERNIA REPAIR Right ~ 1948  . LAPAROTOMY  08/11/2011   Procedure: EXPLORATORY LAPAROTOMY;  Surgeon: Larry Jarred, MD;  Location: [place];  Service: General;  Laterality: N/A;  . PANCREATECTOMY  2009   Partial w/ splenectomy  . SPLENECTOMY, TOTAL  2009  . TONSILLECTOMY      History reviewed. No pertinent family history.  Social History:  reports that he has never smoked. He has never used smokeless tobacco. He reports that he does not drink alcohol or use drugs.  Allergies:  Allergies  Allergen Reactions  . Iohexol Other (See Comments)    Pt states has history of reaction in the past, can not remember what exactly happened, but was told by the dr never to have the contrast again.    Scheduled Medications . allopurinol  100 mg Oral Daily  . amLODipine  10 mg Oral Daily  . aspirin EC  81 mg Oral Daily  .  ceFAZolin (ANCEF) IV  1 g Intravenous Q12H  . doxazosin  8 mg Oral BID  . furosemide  80 mg Intravenous BID  . heparin  5,000 Units Subcutaneous Q8H  . insulin aspart  0-9 Units Subcutaneous TID WC  . levothyroxine  175 mcg Oral QAC breakfast  . linagliptin  5 mg Oral Daily  . pantoprazole  40 mg Oral Daily  . sodium chloride flush  3 mL Intravenous Q12H   Current Meds outpatient  prior to admission  Medication Sig  . allopurinol (ZYLOPRIM) 100 MG tablet Take 100 mg by mouth daily.   Marland Kitchen amLODipine (NORVASC) 10 MG tablet Take 10 mg by mouth daily.    Marland Kitchen doxazosin (CARDURA) 4 MG tablet Take 8 mg by mouth 2 (two) times daily. 2 tabs twice daily  . furosemide (LASIX) 40 MG tablet Take 40 mg by mouth daily.   Marland Kitchen levothyroxine (SYNTHROID, LEVOTHROID) 175 MCG tablet Take 175 mcg by mouth daily before breakfast.  . omeprazole (PRILOSEC) 20 MG capsule Take 20 mg by mouth 2 (two) times daily.   . sitaGLIPtin (JANUVIA) 50 MG tablet Take 50 mg by mouth daily.    Lab Results  Component Value Date   CREATININE 3.12  (H) 03/08/2016   CREATININE 2.99 (H) 03/07/2016   CREATININE 2.6 (H) 02/15/2016   CBC Latest Ref Rng & Units 03/07/2016 02/15/2016 11/16/2015  WBC 4.0 - 10.5 K/uL 8.9 6.6 6.4  Hemoglobin 13.0 - 17.0 g/dL 9.3(L) 10.3(L) 10.8(L)  Hematocrit 39.0 - 52.0 % 27.2(L) 29.1(L) 31.3(L)  Platelets 150 - 400 K/uL 189 221 226   CMP Latest Ref Rng & Units 03/08/2016 03/07/2016 02/15/2016  Glucose 65 - 99 mg/dL 156(H) 157(H) 148(H)  BUN 6 - 20 mg/dL 42(H) 38(H) 47.4(H)  Creatinine 0.61 - 1.24 mg/dL 3.12(H) 2.99(H) 2.6(H)  Sodium 135 - 145 mmol/L 135 137 142  Potassium 3.5 - 5.1 mmol/L 4.7 5.0 4.4  Chloride 101 - 111 mmol/L 104 107 -  CO2 22 - 32 mmol/L 22 23 23   Calcium 8.9 - 10.3 mg/dL 9.0 8.8(L) 9.1  Total Protein 6.5 - 8.1 g/dL - 6.3(L) 6.6  Total Bilirubin 0.3 - 1.2 mg/dL - 1.1 0.93  Alkaline Phos 38 - 126 U/L - 81 99  AST 15 - 41 U/L - 16 16  ALT 17 - 63 U/L - 14(L) 7   Results for Larry [name] (MRN [id number]) as of 03/08/2016 17:47  Ref. Range 03/08/2016 12:29  Protein Creatinine Ratio Latest Ref Range: 0.00 - 0.15 mg/mgCre 10.20 (H)   Dg Chest 2 View  Result Date: 03/07/2016 CLINICAL DATA:  Weakness and body aches for 3 days. Low grade fever. EXAM: CHEST  2 VIEW COMPARISON:  PA and lateral chest 04/07/2015.  CT chest 06/01/2010. FINDINGS: The patient has small to moderate bilateral pleural effusions, larger on the left. Indistinctness of the pulmonary vasculature is present bilaterally with more focal airspace opacities in the lung bases, worse on the left. No pneumothorax. There is mild cardiomegaly. IMPRESSION: Findings most compatible with congestive heart failure with associated small to moderate bilateral pleural effusions. Electronically Signed   By: Larry [name] M.D.   On: 03/07/2016 15:57    Review of Systems  Constitutional: Negative for chills and fever.  Respiratory: Positive for shortness of breath.   Cardiovascular: Positive for leg swelling. Negative for chest pain.    Blood pressure (!) 155/75, pulse 77, temperature 98.6 F (37 C), temperature source Oral, resp. rate 20, height 5\' 8"  (1.727 m), weight 202 lb 3.2 oz (91.7 kg), SpO2 99 %. Physical Exam  Constitutional: He is oriented to person, place, and time. He appears well-developed.  HENT:  Head: Normocephalic and atraumatic.  Eyes: Conjunctivae are normal.  Neck: Normal range of motion. Neck supple.  Cardiovascular:  3/6 systolic murmur, RRR   Respiratory: Effort normal.  Bibasilar crackles noted on exam, 4 L [place] in place   GI: Soft. Bowel sounds are normal.  Musculoskeletal:  3+ pitting edema  of bilateral leg to knee; 3+ edema to thigh  Neurological: He is alert and oriented to person, place, and time.  Skin: Skin is warm.  lipodermatoscelerosis of bilateral legs, multiple carbuncles noted on the back of the neck     Background:  75 y.o. male with pmhx of GERD, hypothyroidism, diabetes mellitus, non-Hodgkin lymphoma, hypertension, CHF, and CKD4 presenting with fatigue, SOB. Last seen by Dr. Florene [name] on 10/15/2015 with SCr 2.37. Later rising 2.76-> 2.91 on 10/17 and 03/02/16 respectively. Admitted with CHF exacerbation, receiving diuresis. We are asked to see because of tenuous/worsening renal function with creatinine up to 3.12 11/8.  Assessment/Plan: 1. AoCKD4 w proteinuria (likely due HTN and T2DM) Now presenting with fluid overload and worsening creatinine likely due to cardiorenal syndrome/ATN. Baseline serum creatinine previously 2.5, however has been worsening over past several months (6/16  SCr 2.37, 10/19 2.76, 11/2 2.91) UPC 10 gm proteinuria so nephrotic range. Most recent  SCr 2.99 > 3.21;trending up; however electrolytes have remained stable.  - Volume overload - UOP 780 over the last 24hours, therefore would increase to 80 mg IV lasix BID, weight improving 204 lbs> 202lbs;   - May need to consider future plans for dialysis during this hospitalization  - Renal function panel   2.  Anemia: Hgb 9.3. Will get Iron and TIBC   3. HTN: hypertensive since admission; currently on amlodipine 10 mg, doxazosin 8 mg BID; will follow along for now.   4. T2DM: A1C 6.9 (2013) sliding scale + tradjenta  5 mg daily   5. Metabolic Bone Disease: will obtain PTH and phosphorus level   6. ID: Concern for viral/flu illness vs. possibly cellulitis: currently on cefazolin and Tamiflu   7. CHF: Symptomatically with congestive heart failure, significant large systolic murmur consistent with aortic stenosis, though no documented ECHO; echo pending- currently receiving diuresis.Cardiology following     Lockie Pares 03/08/2016, 2:13 PM   I have seen and examined this patient and agree with plan and assessment in the above note with renal recommendations/intervention highlighted. Very nice 75 yo WM (patient of Dr. Florene [name]) who is admitted with progressive SOB, DOE, swelling concurrent with recent worsening of renal function as an outpatient (and a reduction in his lasix dose from BID to QD - although his CHF sx were clearly progressing prior to that change). On exam he is volume overloaded, +JVD, + crackles, loud systolic murmur (? AS), 3+ edema. Creatinine is increasing,  though has not had much diuresis as of yet. Nephrotic syndrome contributing to his fluid retenion, and has likely cardiorenal component.  Recommend increasing furosemide, will monitor renal function. Cardiology has seen - ECHO ordered. Have made patient aware that renal function may deteriorate with diuresis and there is a possibility that dialysis could be needed. Will also evaluate his anemia, replete Fe if low, and add ESA.  Stanton Kissoon B,MD 03/08/2016 5:40 PM

## 2016-03-08 NOTE — Progress Notes (Signed)
PROGRESS NOTE  Larry [name] [id number] DOB: [birth year] DOA: 03/07/2016 PCP: Harvie Junior, MD  Brief History:  75 year old male with a history of GERD, diabetes mellitus type 2, non-Hodgkin's lymphoma, hypertension, hypogonadism with low testosterone levels presented with three-day history of fevers, chills, generalized weakness and worsening dyspnea on exertion. The patient states that he has had chronic lower extremity edema, but this has worsened in the past week. The patient states that he recently followed up with his nephrologist, Dr. Florene Glen. From that visit, the patient states that his furosemide was decreased from 40 mg twice a day to once daily for which he has been on for the past 4-5 days prior to admission. His furosemide was decreased due to worsening serum creatinine. Upon presentation, the patient was noted to have serum creatinine 2.99 with chest x-ray showing small to moderate bilateral pleural effusions. The patient was given furosemide 60 mg IV--he has received 2 doses up to this point.  Assessment/Plan: Acute CHF -He has a clinically complicated picture -I feel that his worsening renal function is contributing to his fluid overload -In addition, I suspect the patient may have nephrotic syndrome which is also contributing to third spacing and his fluid overload -The patient may have a component of cardiorenal syndrome as his renal function is worsening with continued diuresis -Consult cardiology for assistance -Urine protein creatinine ratio -Echocardiogram -Daily I's and O's -Daily weights  Acute on chronic renal failure--CKD stage IV -Baseline creatinine 2.4-2.6 -Presenting creatinine 2.99 -Consult the patient's established nephrologist to assist management -Urine protein creatinine ratio -I feel that his worsening renal function may also be contributing to his fluid overload  Elevated troponin -Likely demand ischemia in the setting of worsening  renal failure and decompensated heart failure -No chest pain presently -EKG personally reviewed--sinus rhythm, nonspecific T-wave change  Fever -Viral respiratory panel -Source may also be cellulitis from the patient's neck--he has numerous carbuncles on his posterior neck  -Urinalysis is negative for pyuria  -start cefazolin  Hypertension -Continue amlodipine and Cardura -Anticipate improvement with diuresis  Diabetes mellitus type 2 -Check hemoglobin A1c -Continue NovoLog sliding scale    Disposition Plan:   Not stable for discharge Family Communication:   Family at bedside  Consultants:  Cardiology, nephrology  Code Status:  FULL  DVT Prophylaxis:  [place] Heparin   Procedures: As Listed in Progress Note Above  Antibiotics: None    Subjective: Pt states he is breathing better than yesterday.  Patient denies fevers, chills, headache, chest pain, dyspnea, nausea, vomiting, diarrhea, abdominal pain, dysuria, hematuria, hematochezia, and melena.   Objective: Vitals:   03/07/16 1919 03/07/16 2314 03/08/16 0004 03/08/16 0611  BP: (!) 184/64 (!) 180/53 (!) 167/57 (!) 153/56  Pulse:    85  Resp: 20  (!) 28   Temp: 98.6 F (37 C)  100.1 F (37.8 C) 100 F (37.8 C)  TempSrc: Oral  Oral Oral  SpO2: 97%  98% 98%  Weight: 92.7 kg (204 lb 5.9 oz)   91.7 kg (202 lb 3.2 oz)  Height: 5\' 8"  (1.727 m)       Intake/Output Summary (Last 24 hours) at 03/08/16 0835 Last data filed at 03/08/16 0617  Gross per 24 hour  Intake              480 ml  Output              780 ml  Net             -  300 ml   Weight change:  Exam:   General:  Pt is alert, follows commands appropriately, not in acute distress  HEENT: No icterus, No thrush, No neck mass, [place]/AT  Cardiovascular: RRR, S1/S2, no rubs, no gallops  Respiratory: bibasilar crackles. No wheeze  Abdomen: Soft/+BS, non tender, non distended, no guarding  Extremities: 3 + LE edema, No lymphangitis, No petechiae, No  rashes, no synovitis; + scrotal edema   Data Reviewed: I have personally reviewed following labs and imaging studies Basic Metabolic Panel:  Recent Labs Lab 03/07/16 1436 03/08/16 0252  NA 137 135  K 5.0 4.7  CL 107 104  CO2 23 22  GLUCOSE 157* 156*  BUN 38* 42*  CREATININE 2.99* 3.12*  CALCIUM 8.8* 9.0   Liver Function Tests:  Recent Labs Lab 03/07/16 1436  AST 16  ALT 14*  ALKPHOS 81  BILITOT 1.1  PROT 6.3*  ALBUMIN 3.1*   No results for input(s): LIPASE, AMYLASE in the last 168 hours. No results for input(s): AMMONIA in the last 168 hours. Coagulation Profile: No results for input(s): INR, PROTIME in the last 168 hours. CBC:  Recent Labs Lab 03/07/16 1436  WBC 8.9  NEUTROABS 6.9  HGB 9.3*  HCT 27.2*  MCV 99.6  PLT 189   Cardiac Enzymes:  Recent Labs Lab 03/07/16 1436 03/07/16 2032 03/08/16 0252  TROPONINI 0.08* 0.13* 0.13*   BNP: Invalid input(s): POCBNP CBG:  Recent Labs Lab 03/07/16 1503 03/07/16 2156 03/08/16 0613  GLUCAP 136* 170* 127*   HbA1C: No results for input(s): HGBA1C in the last 72 hours. Urine analysis:    Component Value Date/Time   COLORURINE YELLOW 03/07/2016 1535   APPEARANCEUR CLEAR 03/07/2016 1535   LABSPEC 1.015 03/07/2016 1535   PHURINE 6.0 03/07/2016 1535   GLUCOSEU NEGATIVE 03/07/2016 1535   HGBUR SMALL (A) 03/07/2016 1535   BILIRUBINUR NEGATIVE 03/07/2016 1535   KETONESUR NEGATIVE 03/07/2016 1535   PROTEINUR >300 (A) 03/07/2016 1535   UROBILINOGEN 0.2 08/11/2011 1050   NITRITE NEGATIVE 03/07/2016 1535   LEUKOCYTESUR NEGATIVE 03/07/2016 1535   Sepsis Labs: @LABRCNTIP (procalcitonin:4,lacticidven:4) )No results found for this or any previous visit (from the past 240 hour(s)).   Scheduled Meds: . allopurinol  100 mg Oral Daily  . amLODipine  10 mg Oral Daily  . aspirin EC  81 mg Oral Daily  . doxazosin  8 mg Oral BID  . heparin  5,000 Units Subcutaneous Q8H  . insulin aspart  0-9 Units Subcutaneous  TID WC  . levothyroxine  175 mcg Oral QAC breakfast  . linagliptin  5 mg Oral Daily  . pantoprazole  40 mg Oral Daily  . sodium chloride flush  3 mL Intravenous Q12H   Continuous Infusions:  Procedures/Studies: Dg Chest 2 View  Result Date: 03/07/2016 CLINICAL DATA:  Weakness and body aches for 3 days. Low grade fever. EXAM: CHEST  2 VIEW COMPARISON:  PA and lateral chest 04/07/2015.  CT chest 06/01/2010. FINDINGS: The patient has small to moderate bilateral pleural effusions, larger on the left. Indistinctness of the pulmonary vasculature is present bilaterally with more focal airspace opacities in the lung bases, worse on the left. No pneumothorax. There is mild cardiomegaly. IMPRESSION: Findings most compatible with congestive heart failure with associated small to moderate bilateral pleural effusions. Electronically Signed   By: Inge Rise M.D.   On: 03/07/2016 15:57    Cariah Salatino, DO  [facility] Pager [phone]  If 7PM-7AM, please contact night-coverage [email or web address] Password [facility] 03/08/2016, 8:35  AM   LOS: 1 day

## 2016-03-08 NOTE — Evaluation (Signed)
Physical Therapy Evaluation Patient Details Name: Larry [name] MRN: [id number] DOB: [birth year] Today's Date: 03/08/2016   History of Present Illness  Pt is a 75 y/o male admitted secondary to weakness, acute CHF and fluid overload. PMH including but not limited to Non-Hodgkin lymphoma, DM, HTN, and CKD.  Clinical Impression  Pt presented sitting OOB in recliner when PT entered room. Prior to admission, pt reported that he was independent with all functional mobility. He stated that he continues to drive and walks around his land independently. Pt was on 4L of O2 via [place] with SPO2 >94% in standing. Pt's SPO2 maintained at 94% in standing on RA. However, during ambulation pt's SPO2 dropped to 81%. Upon sitting back in recliner and replacing O2, pt's SPO2 increased to >95% after approximately one minute. Pt would continue to benefit from skilled physical therapy services at this time while admitted and after d/c to address his below listed limitations in order to improve his overall safety and independence with functional mobility.     Follow Up Recommendations SNF;Supervision for mobility/OOB    Equipment Recommendations  None recommended by PT;Other (comment) (defer to next venue)    Recommendations for Other Services       Precautions / Restrictions Precautions Precautions: Fall Restrictions Weight Bearing Restrictions: No      Mobility  Bed Mobility               General bed mobility comments: pt sitting OOB in recliner when PT entered room  Transfers Overall transfer level: Needs assistance Equipment used: Rolling walker (2 wheeled) Transfers: Sit to/from [facility] Sit to Stand: Min guard Stand pivot transfers: Min guard       General transfer comment: pt required increased time, VC'ing for bilateral hand placement and min guard for safety  Ambulation/Gait Ambulation/Gait assistance: Min guard Ambulation Distance (Feet): 20 Feet Assistive  device: Rolling walker (2 wheeled) Gait Pattern/deviations: Step-through pattern;Decreased step length - right;Decreased step length - left;Decreased stride length Gait velocity: decreased Gait velocity interpretation: Below normal speed for age/gender General Gait Details: pt demonstrated safety with use of RW; however, pt's SPO2 dropped to 81% on RA during ambulation.   Stairs            Wheelchair Mobility    Modified Rankin (Stroke Patients Only)       Balance Overall balance assessment: Needs assistance Sitting-balance support: Feet supported;No upper extremity supported Sitting balance-Leahy Scale: Fair     Standing balance support: During functional activity;No upper extremity supported Standing balance-Leahy Scale: Fair Standing balance comment: pt able to stand statically with no UE supports to wipe his buttocks after using the [facility]                             Pertinent Vitals/Pain Pain Assessment: No/denies pain    Home Living Family/patient expects to be discharged to:: Private residence Living Arrangements: Spouse/significant other Available Help at Discharge: Family;Available PRN/intermittently Type of Home: House Home Access: Stairs to enter Entrance Stairs-Rails: Right;Left;Can reach both Entrance Stairs-Number of Steps: 5 Home Layout: One level Home Equipment: None      Prior Function Level of Independence: Independent         Comments: pt continues to drive, mows his lawn and walks around his acre of land at home.     Hand Dominance        Extremity/Trunk Assessment   Upper Extremity Assessment: Overall WFL for tasks  assessed           Lower Extremity Assessment: Generalized weakness         Communication   Communication: No difficulties  Cognition Arousal/Alertness: Awake/alert Behavior During Therapy: WFL for tasks assessed/performed Overall Cognitive Status: Within Functional Limits for tasks assessed                       General Comments      Exercises General Exercises - Lower Extremity Ankle Circles/Pumps: AROM;Right;Left;10 reps;Seated   Assessment/Plan    PT Assessment Patient needs continued PT services  PT Problem List Decreased strength;Decreased activity tolerance;Decreased mobility;Decreased balance;Decreased coordination;Decreased knowledge of use of DME          PT Treatment Interventions DME instruction;Gait training;Stair training;Functional mobility training;Therapeutic activities;Therapeutic exercise;Balance training;Neuromuscular re-education;Patient/family education    PT Goals (Current goals can be found in the Care Plan section)  Acute Rehab PT Goals Patient Stated Goal: to return to PLOF PT Goal Formulation: With patient Time For Goal Achievement: 03/22/16 Potential to Achieve Goals: Good    Frequency Min 3X/week   Barriers to discharge        Co-evaluation               End of Session Equipment Utilized During Treatment: Gait belt;Oxygen (4L O2 via [place]) Activity Tolerance: Patient tolerated treatment well Patient left: in chair;with call bell/phone within reach;with SCD's reapplied Nurse Communication: Mobility status         Time: [id number] PT Time Calculation (min) (ACUTE ONLY): 30 min   Charges:   PT Evaluation $PT Eval Moderate Complexity: 1 Procedure PT Treatments $Gait Training: 8-22 mins   PT G CodesClearnce Sorrel Lucie Friedlander 03/08/2016, 12:19 PM Sherie Don, [place]ache[place], DPT [phone]

## 2016-03-08 NOTE — Progress Notes (Signed)
Pt complains of SOB, denies chest pain, denies nausea and vomiting. K. Schorr was notified and ordered Albuterol neb. K. Schorr was also notified the troponin 0.13  taken @ 2032. Will monitor pt accordingly.   03/08/16 0004  Vitals  Temp 100.1 F (37.8 C)  Temp Source Oral  BP (!) 167/57  BP Location Right Arm  BP Method Automatic  Patient Position (if appropriate) Sitting  ECG Heart Rate (!) 118  Resp (!) 28  Oxygen Therapy  SpO2 98 %  O2 Device Nasal Cannula  O2 Flow Rate (L/min) 4 L/min

## 2016-03-08 NOTE — Consult Note (Signed)
CARDIOLOGY CONSULT NOTE   Patient ID: Larry [name] MRN: [id number] DOB/AGE: Sep 22, 1940 75 y.o.  Admit date: 03/07/2016  Primary Physician   Larry Junior, MD Primary Cardiologist   None Reason for Consultation   CHF Requesting Physician  Dr. Carles [name]  HPI: Larry [name] is a 75 y.o. male with a history of Non-Hodgkin's Lymphoma, HTN, DM, CKD 3, hypothyroidism, and hypotestosteronism who presented with 3-4 days of malaise, generalized weakness, SOB, fevers, chills, and increase in his chronic lower extremity swelling. He is with his wife who provides additional history. Patient was in his usual state of health (able to walk without assistance, do yard work) until his symptoms began. He had been taking Lasix 40 mg po BID until about 1 week ago when his Nephrologist, Dr. Florene [name], decreased dosing to once daily due to concern for increasing serum creatinine. Patient reports he has noticed difficulty breathing since that time which can occur at rest. He sleeps with his head elevated but does not think he has dyspnea when laying flat. He denies any chest pain, palpitations, paroxysmal nocturnal dyspnea, lightheadedness, or syncope. He denies any personal history of heart or lung disease.  In the ED, workup was notable for initial troponin of 0.08, BNP 488.9 (no baseline), serum creatinine of 2.99 (baseline around 2.6), and CXR which showed bibasilar pleural effusions, left greater than right. An EKG showed NSR with nonspecific ST changes in anterior leads without acute ischemia. He received IV lasix 60 x 2.  He used to work in [employer], retired 45 years ago, former Engineer, structural in [place]. He is a never smoker and denies alcohol or illicit drug use. He does not know his biological father and reports a history of heart disease in his nephew.    Past Medical History:  Diagnosis Date  . Chronic kidney disease    "I see Larry [name]" (03/07/2016)  . GERD (gastroesophageal reflux disease)    . Gout   . History of kidney stones    "they passed" (03/07/2016)  . Hyperkalemia   . Hypertension   . Hypotestosteronism 03/29/2011  . Hypothyroidism   . Non Hodgkin's lymphoma ([place])   . 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 Thyroid disease   . Type II diabetes mellitus ([place]) dx'd ~ 2005     Past Surgical History:  Procedure Laterality Date  . APPENDECTOMY  1948  . INGUINAL HERNIA REPAIR Right ~ 1948  . LAPAROTOMY  08/11/2011   Procedure: EXPLORATORY LAPAROTOMY;  Surgeon: Larry Jarred, MD;  Location: [facility];  Service: General;  Laterality: N/A;  . PANCREATECTOMY  2009   Partial w/ splenectomy  . SPLENECTOMY, TOTAL  2009  . TONSILLECTOMY      Allergies  Allergen Reactions  . Iohexol Other (See Comments)    Pt states has history of reaction in the past, can not remember what exactly happened, but was told by the dr never to have the contrast again.    I have reviewed the patient's current medications . allopurinol  100 mg Oral Daily  . amLODipine  10 mg Oral Daily  . aspirin EC  81 mg Oral Daily  .  ceFAZolin (ANCEF) IV  1 g Intravenous Q12H  . doxazosin  8 mg Oral BID  . furosemide  60 mg Intravenous BID  . heparin  5,000 Units Subcutaneous Q8H  . insulin aspart  0-9 Units Subcutaneous TID WC  . levothyroxine  175 mcg Oral QAC breakfast  .  linagliptin  5 mg Oral Daily  . pantoprazole  40 mg Oral Daily  . sodium chloride flush  3 mL Intravenous Q12H    sodium chloride, acetaminophen, albuterol, hydrALAZINE, ondansetron (ZOFRAN) IV, sodium chloride flush  Prior to Admission medications   Medication Sig Start Date End Date Taking? Authorizing Provider  allopurinol (ZYLOPRIM) 100 MG tablet Take 100 mg by mouth daily.  10/20/14  Yes Historical Provider, MD  amLODipine (NORVASC) 10 MG tablet Take 10 mg by mouth daily.     Yes Historical Provider, MD  doxazosin (CARDURA) 4 MG tablet Take 8 mg by mouth 2 (two) times daily. 2 tabs twice daily 10/20/14   Yes Historical Provider, MD  furosemide (LASIX) 40 MG tablet Take 40 mg by mouth daily.    Yes Historical Provider, MD  levothyroxine (SYNTHROID, LEVOTHROID) 175 MCG tablet Take 175 mcg by mouth daily before breakfast.   Yes Historical Provider, MD  omeprazole (PRILOSEC) 20 MG capsule Take 20 mg by mouth 2 (two) times daily.  11/27/11  Yes Historical Provider, MD  sitaGLIPtin (JANUVIA) 50 MG tablet Take 50 mg by mouth daily.   Yes Historical Provider, MD  levothyroxine (SYNTHROID, LEVOTHROID) 150 MCG tablet Take 150 mcg by mouth daily.      Historical Provider, MD  triamcinolone ointment (KENALOG) 0.1 % Apply 1 application topically daily as needed (for irritation).  05/09/15   Historical Provider, MD     Social History   Social History  . Marital status: Married    Spouse name: N/A  . Number of children: N/A  . Years of education: N/A   Occupational History  . Not on file.   Social History Main Topics  . Smoking status: Never Smoker  . Smokeless tobacco: Never Used  . Alcohol use No  . Drug use: No  . Sexual activity: Not Currently   Other Topics Concern  . Not on file   Social History Narrative  . No narrative on file    No family status information on file.    Family Hx: Does not know biological father. Heart disease in nephew.  ROS:  Full 14 point review of systems complete and found to be negative unless listed above.  Physical Exam: Blood pressure (!) 155/75, pulse 77, temperature 98.6 F (37 C), temperature source Oral, resp. rate 20, height 5\' 8"  (1.727 m), weight 202 lb 3.2 oz (91.7 kg), SpO2 99 %.  General: Well developed, well nourished, male in no acute distress Head: Eyes PERRLA, No xanthomas. Normocephalic and atraumatic, oropharynx without edema or exudate.  Lungs: Resp regular and unlabored, distant breath sounds, CTA, no dullness to percussion. Heart: RRR, 3/6 late peaking systolic murmur heard loudest at the RUS border with radiation to the neck   Neck:  No lymphadenopathy, JVD present. Abdomen: Abdomen soft and non-tender without masses or hernias noted. Msk:  sacral edema noted. No spine or cva tenderness.  GU: Scrotal edema, condom cath in place Extremities: Swollen legs with +2 pitting edema to his thighs, . Radials 2+ and equal bilaterally, DP/PT not palpable by hand due to edema. Neuro: Alert and oriented X 3. No focal deficits noted. Psych:  Good affect, responds appropriately Skin: Stasis dermatitis changes  Labs:   Lab Results  Component Value Date   WBC 8.9 03/07/2016   HGB 9.3 (L) 03/07/2016   HCT 27.2 (L) 03/07/2016   MCV 99.6 03/07/2016   PLT 189 03/07/2016   No results for input(s): INR in the last 72 hours.  Recent Labs Lab 03/07/16 1436 03/08/16 0252  NA 137 135  K 5.0 4.7  CL 107 104  CO2 23 22  BUN 38* 42*  CREATININE 2.99* 3.12*  CALCIUM 8.8* 9.0  PROT 6.3*  --   BILITOT 1.1  --   ALKPHOS 81  --   ALT 14*  --   AST 16  --   GLUCOSE 157* 156*  ALBUMIN 3.1*  --    Magnesium  Date Value Ref Range Status  02/16/2008 1.5  Final    Recent Labs  03/07/16 1436 03/07/16 2032 03/08/16 0252  TROPONINI 0.08* 0.13* 0.13*   No results for input(s): TROPIPOC in the last 72 hours. No results found for: PROBNP No results found for: CHOL, HDL, LDLCALC, TRIG No results found for: DDIMER Lipase  Date/Time Value Ref Range Status  08/11/2011 08:49 AM 175 (H) 11 - 59 U/L Final   TSH  Date/Time Value Ref Range Status  07/03/2008 09:51 AM 1.865 0.350 - 4.500 uIU/mL Final    Comment:    Test methodology is 3rd generation TSH   Ferritin  Date/Time Value Ref Range Status  03/10/2014 09:25 AM 93 22 - 316 ng/ml Final   TIBC  Date/Time Value Ref Range Status  03/10/2014 09:25 AM 253 202 - 409 ug/dL Final   Iron  Date/Time Value Ref Range Status  03/10/2014 09:25 AM 88 42 - 163 ug/dL Final   Retic %  Date/Time Value Ref Range Status  10/18/2007 01:24 PM 2.4 (H) 0.7 - 2.3 % Final   Retic Ct Pct    Date/Time Value Ref Range Status  03/29/2011 08:54 AM 0.9 0.4 - 2.3 % Final    Echo: pending  ECG:  Sinus rhythm, non-specific ST changes V2-V3  Radiology:  Dg Chest 2 View  Result Date: 03/07/2016 CLINICAL DATA:  Weakness and body aches for 3 days. Low grade fever. EXAM: CHEST  2 VIEW COMPARISON:  PA and lateral chest 04/07/2015.  CT chest 06/01/2010. FINDINGS: The patient has small to moderate bilateral pleural effusions, larger on the left. Indistinctness of the pulmonary vasculature is present bilaterally with more focal airspace opacities in the lung bases, worse on the left. No pneumothorax. There is mild cardiomegaly. IMPRESSION: Findings most compatible with congestive heart failure with associated small to moderate bilateral pleural effusions. Electronically Signed   By: Inge Rise M.D.   On: 03/07/2016 15:57    ASSESSMENT AND PLAN:    Active Problems:   Diabetes mellitus ([place])   Essential hypertension   CHF (congestive heart failure) (HCC)   Elevated troponin   Acute kidney injury superimposed on CKD (H[facility])   Fever   Pressure injury of skin   Acute renal failure superimposed on stage 4 chronic kidney disease (H[facility])  CHF: Suspected CHF with no prior Echo on file. TTE is pending. Slight creatinine bump from 2.99 > 3.12. Received IV Lasix 60 mg x 2 with improvement in his dyspnea. He does have significant volume on board and agree with continued diuresis along with supportive measures. -Continue IV Lasix 60 mg BID -TTE pending -Place thigh high ted hose -Patient to elevate legs above the level of the heart -Monitor daily standing weights -Strict I/Os -Monitor BMET for renal function and potassium  HTN: BPs slightly elevated, anticipate improvement with volume removal.  -Lasix as above -Continue Amlodipine 10 mg daily  Elevated Troponin: Likely demand ischemia from volume overload, troponin trend flat at 0.08 > 0.13 > 0.13.  Systolic murmur: Suspect moderate  aortic  stenosis with late peaking [id number] systolic murmur heard throughout, loudest at the RUS border with radiation to the neck. -TTE pending  Hypothyroidism: On Synthroid 175 mcg -Check TSH  Acute on Chronic Kidney Disease: Baseline SCr 2.5-2.6. Follows with Dr. Florene [name]. Creatinine up from 2.99 to 3.12. Suspect secondary to volume overload. -Diuresis, daily weights, I/Os, BMET as above -Nephrology consulted  DM: -SSI and Linagliptin per primary  Signed: Zada Finders, MD  [facility] PGY-2 03/08/2016, 3:11 PM  History and all data above reviewed.  Patient examined.  I agree with the findings as above.  He has had slowly increasing edema with his presenting complaint being weakness.  Denies chest pain.  No PND or orthopnea.   The patient exam reveals COR:RRR, apical systolic murmur at the apex mid peaking with radiation out the aortic outflow tract.  ,  Lungs: Clear  ,  Abd: Positive bowel sounds, no rebound no guarding, Ext Severe right greater than left edema with venous stasis changes  .  All available labs, radiology testing, previous records reviewed. Agree with documented assessment and plan.  Edema:  Need to keep the feet elevated and apply thigh high compression stockings.  I reviewed this with the patient, his wife and 3E staff.  We can then restart the Lasix while we are awaiting the results of the echo.   Larry [name]  3:19 PM  03/08/2016

## 2016-03-08 NOTE — Progress Notes (Signed)
Pharmacy Antibiotic Note  Larry [name] is a 75 y.o. male admitted on 03/07/2016 with cellulitis.  Pharmacy has been consulted for cefazolin dosing.  Day #1 of abx for cellulitis of the neck. Has numerous carbuncles. Tmax of 101.6 yesterday, WBC wl.   Plan: Start cefazolin 1g IV Q12 Monitor clinical picture, renal function F/U C&S, abx deescalation / LOT   Height: 5\' 8"  (172.7 cm) Weight: 202 lb 3.2 oz (91.7 kg) IBW/kg (Calculated) : 68.4  Temp (24hrs), Avg:100 F (37.8 C), Min:98.6 F (37 C), Max:101.6 F (38.7 C)   Recent Labs Lab 03/07/16 1436 03/08/16 0252  WBC 8.9  --   CREATININE 2.99* 3.12*  LATICACIDVEN 0.8  --     Estimated Creatinine Clearance: 22.1 mL/min (by C-G formula based on SCr of 3.12 mg/dL (H)).    Allergies  Allergen Reactions  . Iohexol Other (See Comments)    Pt states has history of reaction in the past, can not remember what exactly happened, but was told by the dr never to have the contrast again.    Antimicrobials this admission: Cefazolin 11/8 >>   Dose adjustments this admission: n/a  Microbiology results: 11/7 BCx: sent 11/7 UCx: sent  11/7 Resp panel: sent  Thank you for allowing pharmacy to be a part of this patient's care.  Reginia Naas 03/08/2016 9:02 AM

## 2016-03-09 ENCOUNTER — Inpatient Hospital Stay (HOSPITAL_COMMUNITY): Payer: Medicare Other

## 2016-03-09 DIAGNOSIS — I509 Heart failure, unspecified: Secondary | ICD-10-CM

## 2016-03-09 DIAGNOSIS — I5031 Acute diastolic (congestive) heart failure: Secondary | ICD-10-CM

## 2016-03-09 DIAGNOSIS — L03221 Cellulitis of neck: Secondary | ICD-10-CM

## 2016-03-09 DIAGNOSIS — S37009A Unspecified injury of unspecified kidney, initial encounter: Secondary | ICD-10-CM

## 2016-03-09 LAB — RENAL FUNCTION PANEL
Albumin: 2.7 g/dL — ABNORMAL LOW (ref 3.5–5.0)
Anion gap: 8 (ref 5–15)
BUN: 44 mg/dL — AB (ref 6–20)
CHLORIDE: 103 mmol/L (ref 101–111)
CO2: 25 mmol/L (ref 22–32)
CREATININE: 3.28 mg/dL — AB (ref 0.61–1.24)
Calcium: 8.9 mg/dL (ref 8.9–10.3)
GFR calc Af Amer: 20 mL/min — ABNORMAL LOW (ref >60)
GFR, EST NON AFRICAN AMERICAN: 17 mL/min — AB (ref >60)
Glucose, Bld: 83 mg/dL (ref 65–99)
PHOSPHORUS: 4.3 mg/dL (ref 2.5–4.6)
POTASSIUM: 4.7 mmol/L (ref 3.5–5.1)
Sodium: 136 mmol/L (ref 135–145)

## 2016-03-09 LAB — GLUCOSE, CAPILLARY
GLUCOSE-CAPILLARY: 83 mg/dL (ref 65–99)
GLUCOSE-CAPILLARY: 104 mg/dL — AB (ref 65–99)
GLUCOSE-CAPILLARY: 122 mg/dL — AB (ref 65–99)
Glucose-Capillary: 144 mg/dL — ABNORMAL HIGH (ref 65–99)

## 2016-03-09 LAB — HEMOGLOBIN A1C
Hgb A1c MFr Bld: 5.6 % (ref 4.8–5.6)
MEAN PLASMA GLUCOSE: 114 mg/dL

## 2016-03-09 LAB — ECHOCARDIOGRAM COMPLETE
Height: 68 in
WEIGHTICAEL: 3163.2 [oz_av]

## 2016-03-09 LAB — PARATHYROID HORMONE, INTACT (NO CA): PTH: 132 pg/mL — AB (ref 15–65)

## 2016-03-09 LAB — MAGNESIUM: Magnesium: 1.8 mg/dL (ref 1.7–2.4)

## 2016-03-09 NOTE — Progress Notes (Signed)
Patient Name: Larry [name] Date of Encounter: 03/09/2016  Primary Cardiologist: Dr. [facility] Problem List     Active Problems:   Diabetes mellitus [facility])   Essential hypertension   CHF (congestive heart failure) (HCC)   Elevated troponin   Acute kidney injury superimposed on CKD (HCC)   Fever   Pressure injury of skin   Acute renal failure superimposed on stage 4 chronic kidney disease ([place])     Subjective   Patient did not sleep well last night, but reports no shortness of breath or orthopnea. He wore the ted hose for about 12 hours which became bothersome. He has been trying to keep his legs elevated when laying down. He did walk around some in the room without issue. He feels his energy is improving and thinks his LE edema is slightly improved. No chest pain.  Inpatient Medications    Scheduled Meds: . allopurinol  100 mg Oral Daily  . amLODipine  10 mg Oral Daily  . aspirin EC  81 mg Oral Daily  .  ceFAZolin (ANCEF) IV  1 g Intravenous Q12H  . doxazosin  8 mg Oral BID  . furosemide  80 mg Intravenous BID  . heparin  5,000 Units Subcutaneous Q8H  . insulin aspart  0-9 Units Subcutaneous TID WC  . levothyroxine  175 mcg Oral QAC breakfast  . linagliptin  5 mg Oral Daily  . pantoprazole  40 mg Oral Daily  . sodium chloride flush  3 mL Intravenous Q12H   Continuous Infusions:  PRN Meds: sodium chloride, acetaminophen, albuterol, hydrALAZINE, ondansetron (ZOFRAN) IV, sodium chloride flush   Vital Signs    Vitals:   03/08/16 1225 03/08/16 2030 03/09/16 0522 03/09/16 0856  BP: (!) 155/75 (!) 129/59 (!) 154/61 (!) 172/72  Pulse: 77 69 72 90  Resp: 20 18 18 16   Temp: 98.6 F (37 C) 97.8 F (36.6 C) 98 F (36.7 C) 97.9 F (36.6 C)  TempSrc: Oral Oral Oral Oral  SpO2: 99% 99% 97% 98%  Weight:   197 lb 11.2 oz (89.7 kg)   Height:        Intake/Output Summary (Last 24 hours) at 03/09/16 1012 Last data filed at 03/09/16 0946  Gross per 24 hour    Intake             1120 ml  Output             2820 ml  Net            -1700 ml   Filed Weights   03/07/16 1919 03/08/16 0611 03/09/16 0522  Weight: 204 lb 5.9 oz (92.7 kg) 202 lb 3.2 oz (91.7 kg) 197 lb 11.2 oz (89.7 kg)    Physical Exam    General: Well developed, well nourished, male in no acute distress Lungs: Resp regular and unlabored, distant breath sounds, minimal crackles left base Heart: RRR, 3/6 late peaking systolic murmur heard loudest at the RUS border   Neck: No lymphadenopathy, JVD present. Abdomen: Abdomen soft and non-tender without masses or hernias noted. Extremities: +2 pitting edema to the knees, 1+ edema at thighs, . Radials 2+ and equal bilaterally Neuro: Alert and oriented X 3. No focal deficits noted. Psych:  Good affect, responds appropriately Skin: Venous stasis changes at both legs  Labs    CBC  Recent Labs  03/07/16 1436  WBC 8.9  NEUTROABS 6.9  HGB 9.3*  HCT 27.2*  MCV 99.6  PLT 189  Basic Metabolic Panel  Recent Labs  03/08/16 0252 03/09/16 0358  NA 135 136  K 4.7 4.7  CL 104 103  CO2 22 25  GLUCOSE 156* 83  BUN 42* 44*  CREATININE 3.12* 3.28*  CALCIUM 9.0 8.9  MG  --  1.8  PHOS  --  4.3   Liver Function Tests  Recent Labs  03/07/16 1436 03/09/16 0358  AST 16  --   ALT 14*  --   ALKPHOS 81  --   BILITOT 1.1  --   PROT 6.3*  --   ALBUMIN 3.1* 2.7*   No results for input(s): LIPASE, AMYLASE in the last 72 hours. Cardiac Enzymes  Recent Labs  03/07/16 1436 03/07/16 2032 03/08/16 0252  TROPONINI 0.08* 0.13* 0.13*   BNP Invalid input(s): POCBNP D-Dimer No results for input(s): DDIMER in the last 72 hours. Hemoglobin A1C  Recent Labs  03/08/16 1024  HGBA1C 5.6   Fasting Lipid Panel No results for input(s): CHOL, HDL, LDLCALC, TRIG, CHOLHDL, LDLDIRECT in the last 72 hours. Thyroid Function Tests  Recent Labs  03/08/16 1505  TSH 3.047    Telemetry    Sinus rhythm with 8 beat run of premature  ventricular beats - Personally Reviewed  ECG    Sinus rhythm, no acute ischemic changes - Personally Reviewed  Radiology    Dg Chest 2 View  Result Date: 03/07/2016 CLINICAL DATA:  Weakness and body aches for 3 days. Low grade fever. EXAM: CHEST  2 VIEW COMPARISON:  PA and lateral chest 04/07/2015.  CT chest 06/01/2010. FINDINGS: The patient has small to moderate bilateral pleural effusions, larger on the left. Indistinctness of the pulmonary vasculature is present bilaterally with more focal airspace opacities in the lung bases, worse on the left. No pneumothorax. There is mild cardiomegaly. IMPRESSION: Findings most compatible with congestive heart failure with associated small to moderate bilateral pleural effusions. Electronically Signed   By: Inge Rise M.D.   On: 03/07/2016 15:57    Cardiac Studies   TTE pending  Patient Profile     75 y.o. male with a history of Non-Hodgkin's Lymphoma, HTN, DM, CKD 3, hypothyroidism, and hypotestosteronism who presented with 3-4 days of malaise, generalized weakness, SOB, fevers, chills, and increase in his chronic lower extremity swelling.  Assessment & Plan    CHF: Suspected chronic CHF with no prior Echo on file. TTE is pending. Creatinine trend 2.99 > 3.12 > 3.28. IV Lasix increased to 80 mg BID by nephrology. Weight down 5 lbs since yesterday (net down 7 lbs). UOP of 2 L yesterday, 800 ccs today, net down 1.7 L.  -Continue IV Lasix 80 mg BID -TTE pending -Continue Thigh high ted hose -Continue to elevate legs above the level of the heart -Monitor daily standing weights -Strict I/Os -Monitor BMET for renal function and potassium  HTN: BP elevated this morning. On home Amlodipine and Doxazosin. Has prn Hydralazine but has not received. -Lasix as above -Continue Amlodipine 10 mg daily -Hold ACE/ARB with renal function  Elevated Troponin: Likely demand ischemia from volume overload, troponin trend flat at 0.08 > 0.13 >  0.13.  Systolic murmur: Suspect moderate aortic stenosis with late peaking [id number] systolic murmur heard throughout, loudest at the RUS border with radiation to the neck. -TTE pending  Hypothyroidism: On Synthroid 175 mcg -TSH 3.047  Acute on Chronic Kidney Disease: Follows with Dr. Florene Glen. Creatinine 2.99 >> 3.12 >> 3.28. Suspect secondary to volume overload. -Diuresis, daily weights, I/Os, BMET as above -  Nephrology following  DM: -SSI and Linagliptin per primary  Signed, Zada Finders, MD  03/09/2016, 10:12 AM    History and all data above reviewed.  Patient examined.  I agree with the findings as above.  No pain.  No SOB.  The patient exam reveals COR:RRR  ,  Lungs: Clear  ,  Abd: Positive bowel sounds, no rebound no guarding, Ext Moderate edema improved  .  All available labs, radiology testing, previous records reviewed. Agree with documented assessment and plan. Echo is being done.  I will review.  Good weight loss although his creat is increased.  Continue with current therapy and I will change to knee high TED hose since he was uncomfortable with the thigh highs.    Jeneen Rinks Sherril Shipman  12:22 PM  03/09/2016

## 2016-03-09 NOTE — Progress Notes (Signed)
[facility] Progress Note   Subjective:   Patient did not sleep well overnight. No increased WOB. No chest pain. Thinks his legs look less swollen.    Objective:   BP (!) 154/61 (BP Location: Right Arm)   Pulse 72   Temp 98 F (36.7 C) (Oral)   Resp 18   Ht 5\' 8"  (1.727 m)   Wt 197 lb 11.2 oz (89.7 kg)   SpO2 97%   BMI 30.06 kg/m   Intake/Output Summary (Last 24 hours) at 03/09/16 0741 Last data filed at 03/09/16 [id number]  Gross per 24 hour  Intake             1120 ml  Output             2020 ml  Net             -900 ml   Weight change: -6 lb 10.7 oz (-3.024 kg)   Since admission: -1.2L  Physical Exam: Constitutional: Older male, resting in bed, pleasant HENT:  Head: Normocephalic and atraumatic.  Eyes: Conjunctivae are normal.  Neck: Minimal JVD.  Cardiovascular: 3/6 systolic murmur loudest over R upper sternal border, RRR   Respiratory: Effort normal.  Crackles throughout, worse over bases, [place] in place at 3.5 L  GI: Soft. No TTP. Bowel sounds are normal.  Musculoskeletal: 3+ pitting edema of bilateral leg to knee; 1+ edema to thigh  Neurological: AOx3 Skin: Skin is warm. Lipodermatoscelerosis of bilateral legs  Imaging: Dg Chest 2 View  Result Date: 03/07/2016 CLINICAL DATA:  Weakness and body aches for 3 days. Low grade fever. EXAM: CHEST  2 VIEW COMPARISON:  PA and lateral chest 04/07/2015.  CT chest 06/01/2010. FINDINGS: The patient has small to moderate bilateral pleural effusions, larger on the left. Indistinctness of the pulmonary vasculature is present bilaterally with more focal airspace opacities in the lung bases, worse on the left. No pneumothorax. There is mild cardiomegaly. IMPRESSION: Findings most compatible with congestive heart failure with associated small to moderate bilateral pleural effusions. Electronically Signed   By: Inge Rise M.D.   On: 03/07/2016 15:57    Labs: BMET  Recent Labs Lab 03/07/16 1436 03/08/16 0252  03/09/16 0358  NA 137 135 136  K 5.0 4.7 4.7  CL 107 104 103  CO2 23 22 25   GLUCOSE 157* 156* 83  BUN 38* 42* 44*  CREATININE 2.99* 3.12* 3.28*  CALCIUM 8.8* 9.0 8.9  PHOS  --   --  4.3   CBC  Recent Labs Lab 03/07/16 1436  WBC 8.9  NEUTROABS 6.9  HGB 9.3*  HCT 27.2*  MCV 99.6  PLT 189    Medications:    . allopurinol  100 mg Oral Daily  . amLODipine  10 mg Oral Daily  . aspirin EC  81 mg Oral Daily  .  ceFAZolin (ANCEF) IV  1 g Intravenous Q12H  . doxazosin  8 mg Oral BID  . furosemide  80 mg Intravenous BID  . heparin  5,000 Units Subcutaneous Q8H  . insulin aspart  0-9 Units Subcutaneous TID WC  . levothyroxine  175 mcg Oral QAC breakfast  . linagliptin  5 mg Oral Daily  . pantoprazole  40 mg Oral Daily  . sodium chloride flush  3 mL Intravenous Q12H   Background: Last seen by Dr. Florene Glen on 10/15/2015 with SCr 2.37. Later rising 2.76-> 2.91 on 10/17 and 03/02/16 respectively. Admitted with CHF exacerbation, receiving diuresis. We are asked to see  because of tenuous/worsening renal function with creatinine up to 3.12 11/8. Home lasix had been decreased earlier this month for worsening creatinine, as patient had not expressed progressive SOB and swelling, though pt says these symptoms started before changing medication dose.   Assessment/ Plan:    1. AoCKD4 w proteinuria (likely due to HTN and T2DM) Now presenting with fluid overload and worsening creatinine likely due to cardiorenal syndrome/nephrotic syndrome. Baseline serum creatinine previously 2.5, however has been worsening over past several months (6/16  SCr 2.37, 10/19 2.76, 11/2 2.91). UPC 10 gm proteinuria -- nephrotic range. Most recent  SCr 2.99 > 3.12 > 3.28; trending up; however electrolytes have remained stable.  - Volume overload - UOP 2.0L with increased IV lasix at 80 mg BID; weight improving 204 lbs> 202lbs > 197 lbs. Would continue IV lasix 80 mg BID. - May need to consider future plans for dialysis  during this hospitalization  - Daily renal function panel  2. Anemia: Hgb 9.3. Iron 50 and TIBC 225 11/8. Repeat CBC tomorrow. If hgb < 10.0, will start weekly aranesp. 3. HTN: hypertensive since admission, SBPs 120s-150s; currently on amlodipine 10 mg, doxazosin 8 mg BID; continue diuresis.  4. T2DM: A1C 6.9 (2013) sliding scale + tradjenta  5 mg daily  5. Metabolic Bone Disease: PTH pending. Phosphorus normal at 4.3 11/8.  6. ID: Concern for viral/flu illness vs. possibly cellulitis: currently on cefazolin; RVP and influenza panel negative.   7. CHF: Symptomatically with congestive heart failure, significant large systolic murmur consistent with aortic stenosis, though no documented ECHO; echo ordered - currently receiving diuresis.Cardiology following.   Olene Floss, MD [place], PGY-2 03/09/2016, 7:41 AM   I have seen and examined this patient and agree with plan and assessment in the above note with renal recommendations/interventions highlighted. Diuresing on lasix 80 IV Q12H. Still sig volume overload. Renal fx about the same GFR wise. ECHO pending.   Miki Blank B,MD 03/09/2016 12:37 PM

## 2016-03-09 NOTE — Progress Notes (Signed)
PROGRESS NOTE  Larry [name] [id number] DOB: [birth year] DOA: 03/07/2016 PCP: Harvie Junior, MD  Brief History:  Brief History:  75 year old male with a history of GERD, diabetes mellitus type 2, non-Hodgkin's lymphoma, hypertension, hypogonadism with low testosterone levels presented with three-day history of fevers, chills, generalized weakness and worsening dyspnea on exertion. The patient states that he has had chronic lower extremity edema, but this has worsened in the past week. The patient states that he recently followed up with his nephrologist, Dr. Florene Glen. From that visit, the patient states that his furosemide was decreased from 40 mg twice a day to once daily for which he has been on for the past 4-5 days prior to admission. His furosemide was decreased due to worsening serum creatinine. Upon presentation, the patient was noted to have serum creatinine 2.99 with chest x-ray showing small to moderate bilateral pleural effusions. The patient was given furosemide 60 mg IV--he has received 2 doses up to this point.  Assessment/Plan: Acute CHF -He has a clinically complicated picture -I feel that his worsening renal function is contributing to his fluid overload -In addition, I suspect the patient may have nephrotic syndrome which is also contributing to third spacing and his fluid overload -The patient may have a component of cardiorenal syndrome as his renal function is worsening with continued diuresis -appreciate cardiology assistance -Urine protein creatinine ratio-->10.20-->nephrotic range -Echocardiogram--pending -Daily I's and O's -Daily weights--NEG 7 lbs since admission  Acute on chronic renal failure--CKD stage IV -Baseline creatinine 2.4-2.6 -Presenting creatinine 2.99 -Consult the patient's established nephrologist to assist management -Urine protein creatinine ratio--10.20 -will need to tolerate worse renal function for improved pulm function -AM  renal panel  Elevated troponin -Likely demand ischemia in the setting of worsening renal failure and decompensated heart failure -No chest pain presently -EKG personally reviewed--sinus rhythm, nonspecific T-wave change  Fever-->cellulitis of neck/carbuncles -Viral respiratory panel--neg -Source may also be cellulitis from the patient's neck--he has numerous carbuncles on his posterior neck  -Urinalysis is negative for pyuria  -continue cefazolin  Hypertension -Continue amlodipine and Cardura -Anticipate improvement with diuresis  Diabetes mellitus type 2, controlled -Check hemoglobin A1c--5.6 -D/C CBGs    Disposition Plan:  SNF when euvolemic Family Communication:   wife updated at bedside11/9/17--Total time spent 35 minutes.  Greater than 50% spent face to face counseling and coordinating care.   Consultants:  Cardiology, nephrology  Code Status:  FULL  DVT Prophylaxis:  [place] Heparin   Procedures: As Listed in Progress Note Above  Antibiotics: None      Subjective: Patient denies fevers, chills, headache, chest pain, dyspnea, nausea, vomiting, diarrhea, abdominal pain, dysuria, hematuria, hematochezia, and melena.   Objective: Vitals:   03/08/16 2030 03/09/16 0522 03/09/16 0856 03/09/16 1150  BP: (!) 129/59 (!) 154/61 (!) 172/72 (!) 159/59  Pulse: 69 72 90 84  Resp: 18 18 16 18   Temp: 97.8 F (36.6 C) 98 F (36.7 C) 97.9 F (36.6 C) 98.8 F (37.1 C)  TempSrc: Oral Oral Oral Oral  SpO2: 99% 97% 98% 97%  Weight:  89.7 kg (197 lb 11.2 oz)    Height:        Intake/Output Summary (Last 24 hours) at 03/09/16 1810 Last data filed at 03/09/16 0946  Gross per 24 hour  Intake              650 ml  Output  2820 ml  Net            -2170 ml   Weight change: -3.024 kg (-6 lb 10.7 oz) Exam:   General:  Pt is alert, follows commands appropriately, not in acute distress  HEENT: No icterus, No thrush, No neck mass,  [place]/AT  Cardiovascular: RRR, S1/S2, no rubs, no gallops  Respiratory: bibasilar crackles, no wheeze  Abdomen: Soft/+BS, non tender, non distended, no guarding  Extremities: 1+LE edema, No lymphangitis, No petechiae, No rashes, no synovitis   Data Reviewed: I have personally reviewed following labs and imaging studies Basic Metabolic Panel:  Recent Labs Lab 03/07/16 1436 03/08/16 0252 03/09/16 0358  NA 137 135 136  K 5.0 4.7 4.7  CL 107 104 103  CO2 23 22 25   GLUCOSE 157* 156* 83  BUN 38* 42* 44*  CREATININE 2.99* 3.12* 3.28*  CALCIUM 8.8* 9.0 8.9  MG  --   --  1.8  PHOS  --   --  4.3   Liver Function Tests:  Recent Labs Lab 03/07/16 1436 03/09/16 0358  AST 16  --   ALT 14*  --   ALKPHOS 81  --   BILITOT 1.1  --   PROT 6.3*  --   ALBUMIN 3.1* 2.7*   No results for input(s): LIPASE, AMYLASE in the last 168 hours. No results for input(s): AMMONIA in the last 168 hours. Coagulation Profile: No results for input(s): INR, PROTIME in the last 168 hours. CBC:  Recent Labs Lab 03/07/16 1436  WBC 8.9  NEUTROABS 6.9  HGB 9.3*  HCT 27.2*  MCV 99.6  PLT 189   Cardiac Enzymes:  Recent Labs Lab 03/07/16 1436 03/07/16 2032 03/08/16 0252  TROPONINI 0.08* 0.13* 0.13*   BNP: Invalid input(s): POCBNP CBG:  Recent Labs Lab 03/08/16 1635 03/08/16 2127 03/09/16 0601 03/09/16 1122 03/09/16 1712  GLUCAP 121* 115* 83 104* 144*   HbA1C:  Recent Labs  03/08/16 1024  HGBA1C 5.6   Urine analysis:    Component Value Date/Time   COLORURINE YELLOW 03/07/2016 1535   APPEARANCEUR CLEAR 03/07/2016 1535   LABSPEC 1.015 03/07/2016 1535   PHURINE 6.0 03/07/2016 1535   GLUCOSEU NEGATIVE 03/07/2016 1535   HGBUR SMALL (A) 03/07/2016 1535   BILIRUBINUR NEGATIVE 03/07/2016 1535   KETONESUR NEGATIVE 03/07/2016 1535   PROTEINUR >300 (A) 03/07/2016 1535   UROBILINOGEN 0.2 08/11/2011 1050   NITRITE NEGATIVE 03/07/2016 1535   LEUKOCYTESUR NEGATIVE 03/07/2016 1535    Sepsis Labs: @LABRCNTIP (procalcitonin:4,lacticidven:4) ) Recent Results (from the past 240 hour(s))  Blood culture (routine x 2)     Status: None (Preliminary result)   Collection Time: 03/07/16  2:30 PM  Result Value Ref Range Status   Specimen Description BLOOD RIGHT ANTECUBITAL  Final   Special Requests BOTTLES DRAWN AEROBIC AND ANAEROBIC 5CC  Final   Culture NO GROWTH 2 DAYS  Final   Report Status PENDING  Incomplete  Blood culture (routine x 2)     Status: None (Preliminary result)   Collection Time: 03/07/16  2:39 PM  Result Value Ref Range Status   Specimen Description BLOOD RIGHT HAND  Final   Special Requests BOTTLES DRAWN AEROBIC ONLY 10CC  Final   Culture NO GROWTH 2 DAYS  Final   Report Status PENDING  Incomplete  Urine culture     Status: Abnormal   Collection Time: 03/07/16  3:25 PM  Result Value Ref Range Status   Specimen Description URINE, RANDOM  Final   Special  Requests NONE  Final   Culture MULTIPLE SPECIES PRESENT, SUGGEST RECOLLECTION (A)  Final   Report Status 03/08/2016 FINAL  Final  Respiratory Panel by PCR     Status: None   Collection Time: 03/07/16  4:53 PM  Result Value Ref Range Status   Adenovirus NOT DETECTED NOT DETECTED Final   Coronavirus 229E NOT DETECTED NOT DETECTED Final   Coronavirus HKU1 NOT DETECTED NOT DETECTED Final   Coronavirus NL63 NOT DETECTED NOT DETECTED Final   Coronavirus OC43 NOT DETECTED NOT DETECTED Final   Metapneumovirus NOT DETECTED NOT DETECTED Final   Rhinovirus / Enterovirus NOT DETECTED NOT DETECTED Final   Influenza A NOT DETECTED NOT DETECTED Final   Influenza B NOT DETECTED NOT DETECTED Final   Parainfluenza Virus 1 NOT DETECTED NOT DETECTED Final   Parainfluenza Virus 2 NOT DETECTED NOT DETECTED Final   Parainfluenza Virus 3 NOT DETECTED NOT DETECTED Final   Parainfluenza Virus 4 NOT DETECTED NOT DETECTED Final   Respiratory Syncytial Virus NOT DETECTED NOT DETECTED Final   Bordetella pertussis NOT  DETECTED NOT DETECTED Final   Chlamydophila pneumoniae NOT DETECTED NOT DETECTED Final   Mycoplasma pneumoniae NOT DETECTED NOT DETECTED Final     Scheduled Meds: . allopurinol  100 mg Oral Daily  . amLODipine  10 mg Oral Daily  . aspirin EC  81 mg Oral Daily  .  ceFAZolin (ANCEF) IV  1 g Intravenous Q12H  . doxazosin  8 mg Oral BID  . furosemide  80 mg Intravenous BID  . heparin  5,000 Units Subcutaneous Q8H  . insulin aspart  0-9 Units Subcutaneous TID WC  . levothyroxine  175 mcg Oral QAC breakfast  . linagliptin  5 mg Oral Daily  . pantoprazole  40 mg Oral Daily  . sodium chloride flush  3 mL Intravenous Q12H   Continuous Infusions:  Procedures/Studies: Dg Chest 2 View  Result Date: 03/07/2016 CLINICAL DATA:  Weakness and body aches for 3 days. Low grade fever. EXAM: CHEST  2 VIEW COMPARISON:  PA and lateral chest 04/07/2015.  CT chest 06/01/2010. FINDINGS: The patient has small to moderate bilateral pleural effusions, larger on the left. Indistinctness of the pulmonary vasculature is present bilaterally with more focal airspace opacities in the lung bases, worse on the left. No pneumothorax. There is mild cardiomegaly. IMPRESSION: Findings most compatible with congestive heart failure with associated small to moderate bilateral pleural effusions. Electronically Signed   By: Inge Rise M.D.   On: 03/07/2016 15:57    Mystique Bjelland, DO  [facility] Pager [phone]  If 7PM-7AM, please contact night-coverage [email or web address] Password [email or web address] 03/09/2016, 6:10 PM   LOS: 2 days

## 2016-03-09 NOTE — Clinical Social Work Note (Signed)
CSW met with patient. No supports at bedside. CSW discussed PT recommendation for SNF. Patient has been to [facility] in the past and prefers not to go to a SNF unless he deems it absolutely necessary. Patient looks forward to returning home to walk on his land, etc. 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 He states that his wife is typically home but when she is working, he has very supportive neighbors. Patient will notify CSW if he changes his mind. RNCM notified.  CSW signing off. Consult again if any social work needs arise.  Larry [name], [place]

## 2016-03-09 NOTE — Consult Note (Signed)
   [facility] CM Inpatient Consult   03/09/2016  Larry [name] Feb 17, 1941 [id number]   Patient screened for potential [place] Management services for HF exacerbation.  Chart review reveals per MD H&P notes Larry [name]  is a 75 y.o. male, With past medical history of GERD, hypothyroidism, diabetes mellitus, non-Hodgkin lymphoma, hypertension, hypotension, Hypotestosteronism, patient presents with complaints of generalized weakness and fatigue prior to admission, poor appetite, fever and chills, cough over last 24 hours, nonproductive, and exertional dyspnea, as well reports worsening lower extremity edema over the last week, patient Lasix dose was recently decreased by Dr. Florene Glen after labs were done (from Lasix 40 mg twice a day to once daily), patient denies any chest pain, productive sputum, hemoptysis, nausea or vomiting, diarrhea, abdominal pain. Patient's chest x-ray with no opacity, but evidence of bilateral pleural effusion and vascular congestion, negative urinalysis, will leukocytosis, patient was given IV Lasix . Current discharge plan is unknown but likely for skilled nursing facility as recommended by the therapy staff.  Met with the patient at the bedside to share the benefits for community care management services.  Patient states he was unsure of the discharge plans currently but glad to know of the benefits.  He endorses he is a recent new patient for [facility] Physician. Patient is eligible for [facility] Care Management services under patient's Medicare/ACO Registry plan.  A brochure with contact information, 24 hr nurse line magnet information was given.  For questions contact:   Larry Brood, RN BSN [facility] Liaison  [phone] business mobile phone Toll free office [phone]

## 2016-03-09 NOTE — Progress Notes (Signed)
Pt has not acute distress., Weaning on O2, ECHO Completed waiting on MD interpretation.

## 2016-03-09 NOTE — Care Management Note (Signed)
Case Management Note  Patient Details  Name: Larry [name] MRN: [id number] Date of Birth: 05/30/1940  Subjective/Objective: Admitted with CHF                   Action/Plan: Patient lives at home with spouse; continues to drive his vehicle; PCP is Dr Sheryn Bison; Private insurance with medicare; pharmacy of choice is [facility]; patient reports no problem getting his medication. He has a cane that he use PRN. Patient has scales at home and is encouraged to weigh himself daily. He also states that he eats a heart health diet low in sodium. Patient could benefit from [facility] services but refuse any HHC at this time. CM will continue to follow for DCP  Expected Discharge Date:      Possibly 03/11/2016            Expected Discharge Plan:  [place]  Discharge planning Services  CM Consult   Choice offered to:  Patient  [facility] Arranged:  Patient Refused  Status of Service:  In process, will continue to follow  Sherrilyn Rist [id number] 03/09/2016, 4:02 PM

## 2016-03-10 DIAGNOSIS — L0292 Furuncle, unspecified: Secondary | ICD-10-CM

## 2016-03-10 DIAGNOSIS — R600 Localized edema: Secondary | ICD-10-CM

## 2016-03-10 DIAGNOSIS — I5031 Acute diastolic (congestive) heart failure: Secondary | ICD-10-CM

## 2016-03-10 LAB — CBC
HEMATOCRIT: 27.4 % — AB (ref 39.0–52.0)
HEMOGLOBIN: 9.5 g/dL — AB (ref 13.0–17.0)
MCH: 34.7 pg — ABNORMAL HIGH (ref 26.0–34.0)
MCHC: 34.7 g/dL (ref 30.0–36.0)
MCV: 100 fL (ref 78.0–100.0)
Platelets: 200 10*3/uL (ref 150–400)
RBC: 2.74 MIL/uL — AB (ref 4.22–5.81)
RDW: 14.5 % (ref 11.5–15.5)
WBC: 8.6 10*3/uL (ref 4.0–10.5)

## 2016-03-10 LAB — RENAL FUNCTION PANEL
ALBUMIN: 2.6 g/dL — AB (ref 3.5–5.0)
ANION GAP: 8 (ref 5–15)
BUN: 46 mg/dL — ABNORMAL HIGH (ref 6–20)
CALCIUM: 8.7 mg/dL — AB (ref 8.9–10.3)
CO2: 25 mmol/L (ref 22–32)
Chloride: 103 mmol/L (ref 101–111)
Creatinine, Ser: 3.34 mg/dL — ABNORMAL HIGH (ref 0.61–1.24)
GFR calc non Af Amer: 17 mL/min — ABNORMAL LOW (ref >60)
GFR, EST AFRICAN AMERICAN: 19 mL/min — AB (ref >60)
Glucose, Bld: 78 mg/dL (ref 65–99)
PHOSPHORUS: 4.2 mg/dL (ref 2.5–4.6)
POTASSIUM: 4.7 mmol/L (ref 3.5–5.1)
SODIUM: 136 mmol/L (ref 135–145)

## 2016-03-10 LAB — PROCALCITONIN

## 2016-03-10 MED ORDER — SODIUM CHLORIDE 0.9 % IV SOLN
510.0000 mg | Freq: Once | INTRAVENOUS | Status: AC
  Administered 2016-03-10: 510 mg via INTRAVENOUS
  Filled 2016-03-10: qty 17

## 2016-03-10 MED ORDER — NEPRO/CARBSTEADY PO LIQD
237.0000 mL | Freq: Two times a day (BID) | ORAL | Status: DC
  Administered 2016-03-11 – 2016-03-12 (×3): 237 mL via ORAL
  Filled 2016-03-10 (×7): qty 237

## 2016-03-10 MED ORDER — FUROSEMIDE 80 MG PO TABS
160.0000 mg | ORAL_TABLET | Freq: Two times a day (BID) | ORAL | Status: DC
  Administered 2016-03-10 – 2016-03-12 (×4): 160 mg via ORAL
  Filled 2016-03-10 (×4): qty 2

## 2016-03-10 MED ORDER — DARBEPOETIN ALFA 60 MCG/0.3ML IJ SOSY
60.0000 ug | PREFILLED_SYRINGE | Freq: Once | INTRAMUSCULAR | Status: AC
  Administered 2016-03-10: 60 ug via SUBCUTANEOUS
  Filled 2016-03-10: qty 0.3

## 2016-03-10 MED ORDER — CEPHALEXIN 500 MG PO CAPS
500.0000 mg | ORAL_CAPSULE | Freq: Two times a day (BID) | ORAL | Status: DC
  Administered 2016-03-11 – 2016-03-12 (×3): 500 mg via ORAL
  Filled 2016-03-10 (×3): qty 1

## 2016-03-10 NOTE — Progress Notes (Signed)
[facility] Progress Note   Subjective:   Patient denies any dyspnea. Report they reduced his oxygen to 1 L, though he feels he does not need it at all.     Objective:   BP (!) 154/71 (BP Location: Right Arm)   Pulse 86   Temp 98.3 F (36.8 C) (Oral)   Resp 18   Ht 5\' 8"  (1.727 m)   Wt 189 lb 12.8 oz (86.1 kg)   SpO2 98%   BMI 28.86 kg/m   Intake/Output Summary (Last 24 hours) at 03/10/16 0836 Last data filed at 03/10/16 0600  Gross per 24 hour  Intake              650 ml  Output             2640 ml  Net            -1990 ml   Weight change: -7 lb 14.4 oz (-3.583 kg)   Since admission: -1.2L  Physical Exam: Constitutional: Older male, resting in bed, pleasant Currently lying flat, no oxygen, sats OK Cardiovascular: 3/6 systolic murmur loudest over R upper sternal border, RRR   Respiratory: Effort normal, CTAB this morning GI: Soft. No TTP. Bowel sounds are normal.  Musculoskeletal: 3+ pitting edema of bilateral leg to knee; 2+ edema to thigh  Skin: Skin is warm. Lipodermatoscelerosis of bilateral legs  Labs: BMET  Recent Labs Lab 03/07/16 1436 03/08/16 0252 03/09/16 0358 03/10/16 0513  NA 137 135 136 136  K 5.0 4.7 4.7 4.7  CL 107 104 103 103  CO2 23 22 25 25   GLUCOSE 157* 156* 83 78  BUN 38* 42* 44* 46*  CREATININE 2.99* 3.12* 3.28* 3.34*  CALCIUM 8.8* 9.0 8.9 8.7*  PHOS  --   --  4.3 4.2   CBC  Recent Labs Lab 03/07/16 1436 03/10/16 0513  WBC 8.9 8.6  NEUTROABS 6.9  --   HGB 9.3* 9.5*  HCT 27.2* 27.4*  MCV 99.6 100.0  PLT 189 200    Medications:    . allopurinol  100 mg Oral Daily  . amLODipine  10 mg Oral Daily  . aspirin EC  81 mg Oral Daily  .  ceFAZolin (ANCEF) IV  1 g Intravenous Q12H  . doxazosin  8 mg Oral BID  . furosemide  80 mg Intravenous BID  . heparin  5,000 Units Subcutaneous Q8H  . levothyroxine  175 mcg Oral QAC breakfast  . linagliptin  5 mg Oral Daily  . pantoprazole  40 mg Oral Daily  . sodium  chloride flush  3 mL Intravenous Q12H   Background: Last seen by Dr. Florene Glen on 10/15/2015 with SCr 2.37. Later rising 2.76-> 2.91 on 10/17 and 03/02/16 respectively. Admitted with CHF exacerbation, receiving diuresis. We are asked to see because of tenuous/worsening renal function with creatinine up to 3.12 11/8. Home lasix had been decreased earlier this month for worsening creatinine, as patient had not expressed progressive SOB and swelling, though pt says these symptoms started before changing medication dose. Kidney's responding adequately to lasix.    Assessment/ Plan:    1. AoCKD4 w proteinuria (likely due to HTN and T2DM) Now presenting with fluid overload and worsening creatinine likely due to cardiorenal syndrome/nephrotic syndrome. Baseline serum creatinine previously 2.5, however has been worsening over past several months (6/16  SCr 2.37, 10/19 2.76, 11/2 2.91). Most recent  SCr 2.99 > 3.12 > 3.28>3,34; trending up; however electrolytes have remained stable.  -  Volume overload - UOP 2.6 L; weight improving 204 lbs> 202lbs > 197 lbs>189 lbs.  On IV lasix 80 mg BID. Consider change to po and would recommend 160 po BID. Already has 03/15/16 followup scheduled with Dr. Florene Glen, so if able to change to po, and does OK would have timely outpt followup - Provide dialysis video  - Daily renal function panel  2. Anemia: Hgb 9.5. Iron 50 and TIBC 225 11/8. Repeat CBC tomorrow. Feraheme x 1. Aranesp 60 mcg  3. HTN: hypertensive since admission, SBPs 150s; currently on amlodipine 10 mg, doxazosin 8 mg BID; continue diuresis.   4. T2DM: A1C 6.9 (2013) sliding scale + tradjenta  5 mg daily  5. Metabolic Bone Disease: PTH slightly elevated at 132. Phosphorus normal at 4.2 - No need for treatment 6. ID: Concern for viral/flu illness vs. possibly cellulitis: currently on cefazolin; RVP and influenza panel negative.   7. CHF: Diastolic heart failure,  ECHO - EF 123456, grade 2 diastolic dysfunction; mild  mitral regurgitation; currently receiving diuresis.Cardiology following.   Kerrin Mo,  MD [place], PGY-2 03/10/2016, 8:36 AM   I have seen and examined this patient and agree with plan and assessment in the above note with renal recommendations/intervention highlighted. Based on ECHO, LV fx pretty good, some diastolic dysfunction. I believe that his worsening renal function (over past 6 months prior to admission) and nephrotic proteinuria (10 grams) are the root of his fluid retention problems. Off O2, lying flat (wants to get up). Near a point where lasix can be converted to po and would recommend 160 BID. Already has 03/15/16 appt scheduled with Dr. Florene Glen.  No dialysis indications at this time but clearly needs CKD education, dialysis education so that planning can proceed. Stage of CKD discussed w/wife. Plan to show dialysis videos. Managing anemia with Fe and Aranesp. Other recommendations as per the note.   Daine Gunther B,MD 03/10/2016 1:16 PM

## 2016-03-10 NOTE — Progress Notes (Signed)
Patient Name: GERY VANVEEN Date of Encounter: 03/10/2016  Primary Cardiologist: Dr. [facility] Problem List     Active Problems:   Diabetes mellitus [facility])   Essential hypertension   CHF (congestive heart failure) (HCC)   Elevated troponin   Acute kidney injury superimposed on CKD (HCC)   Fever   Pressure injury of skin   Acute renal failure superimposed on stage 4 chronic kidney disease (HCC)   Cellulitis, neck   Acute congestive heart failure ([place])     Subjective   Patient feels well this morning, continues to feel improved. He denies any shortness of breath or chest pain. He feels his swelling is decreasing. He feels his energy is improving.  Inpatient Medications    Scheduled Meds: . allopurinol  100 mg Oral Daily  . amLODipine  10 mg Oral Daily  . aspirin EC  81 mg Oral Daily  .  ceFAZolin (ANCEF) IV  1 g Intravenous Q12H  . doxazosin  8 mg Oral BID  . furosemide  80 mg Intravenous BID  . heparin  5,000 Units Subcutaneous Q8H  . levothyroxine  175 mcg Oral QAC breakfast  . linagliptin  5 mg Oral Daily  . pantoprazole  40 mg Oral Daily  . sodium chloride flush  3 mL Intravenous Q12H   Continuous Infusions:  PRN Meds: sodium chloride, acetaminophen, albuterol, hydrALAZINE, ondansetron (ZOFRAN) IV, sodium chloride flush   Vital Signs    Vitals:   03/09/16 2009 03/09/16 2348 03/10/16 0417 03/10/16 0600  BP: (!) 167/68 (!) 152/58 (!) 154/71   Pulse: 68  86   Resp: 18  18   Temp: 97.9 F (36.6 C)  98.3 F (36.8 C)   TempSrc: Oral  Oral   SpO2: 98%  98%   Weight:    189 lb 12.8 oz (86.1 kg)  Height:        Intake/Output Summary (Last 24 hours) at 03/10/16 [id number] Last data filed at 03/10/16 0600  Gross per 24 hour  Intake              650 ml  Output             2640 ml  Net            -1990 ml   Filed Weights   03/08/16 0611 03/09/16 0522 03/10/16 0600  Weight: 202 lb 3.2 oz (91.7 kg) 197 lb 11.2 oz (89.7 kg) 189 lb 12.8 oz (86.1 kg)     Physical Exam    General: Well developed, well nourished, male in no acute distress Lungs: Resp regular and unlabored, distant breath sounds, clear to auscultation Heart: RRR, 3/6 late peaking systolic murmur heard loudest at the RUS border   Neck: No lymphadenopathy, JVD present. Abdomen: Abdomen soft and non-tender without masses or hernias noted. Extremities: knee high stockings on, edema is improving, . Radials 2+ and equal bilaterally Neuro: Alert and oriented X 3. No focal deficits noted. Psych:  Good affect, responds appropriately   Labs    CBC  Recent Labs  03/07/16 1436 03/10/16 0513  WBC 8.9 8.6  NEUTROABS 6.9  --   HGB 9.3* 9.5*  HCT 27.2* 27.4*  MCV 99.6 100.0  PLT 189 [id number]   Basic Metabolic Panel  Recent Labs  03/09/16 0358 03/10/16 0513  NA 136 136  K 4.7 4.7  CL 103 103  CO2 25 25  GLUCOSE 83 78  BUN 44* 46*  CREATININE 3.28* 3.34*  CALCIUM  8.9 8.7*  MG 1.8  --   PHOS 4.3 4.2   Liver Function Tests  Recent Labs  03/07/16 1436 03/09/16 0358 03/10/16 0513  AST 16  --   --   ALT 14*  --   --   ALKPHOS 81  --   --   BILITOT 1.1  --   --   PROT 6.3*  --   --   ALBUMIN 3.1* 2.7* 2.6*   No results for input(s): LIPASE, AMYLASE in the last 72 hours. Cardiac Enzymes  Recent Labs  03/07/16 1436 03/07/16 2032 03/08/16 0252  TROPONINI 0.08* 0.13* 0.13*   BNP Invalid input(s): POCBNP D-Dimer No results for input(s): DDIMER in the last 72 hours. Hemoglobin A1C  Recent Labs  03/08/16 1024  HGBA1C 5.6   Fasting Lipid Panel No results for input(s): CHOL, HDL, LDLCALC, TRIG, CHOLHDL, LDLDIRECT in the last 72 hours. Thyroid Function Tests  Recent Labs  03/08/16 1505  TSH 3.047    Telemetry    Sinus rhythm, no Vtach - Personally Reviewed  ECG    Sinus rhythm, no acute ischemic changes - Personally Reviewed  Radiology    No results found.  Cardiac Studies   TTE 03/09/16: - Left ventricle: The cavity size was normal.  Systolic function was   normal. The estimated ejection fraction was in the range of 55%   to 60%. Wall motion was normal; there were no regional wall   motion abnormalities. Features are consistent with a pseudonormal   left ventricular filling pattern, with concomitant abnormal   relaxation and increased filling pressure (grade 2 diastolic   dysfunction). Doppler parameters are consistent with high   ventricular filling pressure. - Aortic valve: Trileaflet; normal thickness, mildly calcified   leaflets. - Mitral valve: There was mild regurgitation. - Left atrium: The atrium was mildly dilated.   Patient Profile     75 y.o. male with a history of Non-Hodgkin's Lymphoma, HTN, DM, CKD 3, hypothyroidism, and hypotestosteronism who presented with 3-4 days of malaise, generalized weakness, SOB, fevers, chills, and increase in his chronic lower extremity swelling.  Assessment & Plan    Diastolic CHF: Suspected chronic CHF with no prior Echo on file. Weight down 15 lbs . UOP of 2.6 L yesterdaynet down 3.2 L. TTE this admission shows EF 55-60%, pseudonormal LV filling pattern, grade 2 diastolic dysfunction. Continues to diurese well. -Continue IV Lasix 80 mg BID -Continue knee high ted hose -Continue to elevate legs above the level of the heart -Monitor daily standing weights -Strict I/Os -Monitor RFP for renal function and potassium  HTN: BP slightly elevated this morning. On home Amlodipine and Doxazosin. Has prn Hydralazine but has not received. -Lasix as above -Continue Amlodipine 10 mg daily -Hold ACE/ARB with renal function  Elevated Troponin: Likely demand ischemia from volume overload, troponin trend flat at 0.08 > 0.13 > 0.13.  Systolic murmur: Suspected moderate aortic stenosis with late peaking 123456 systolic murmur heard throughout, loudest at the RUS border with radiation to the neck. -TTE without evidence of aortic valve stenosis  Hypothyroidism: On Synthroid 175  mcg -TSH 3.047  Acute on Chronic Kidney Disease: Follows with Dr. Florene Glen. Creatinine essentially flat from 3.28 >> 3.34. -Diuresis, daily weights, I/Os, RFP as above -Nephrology following  DM: -SSI and Linagliptin per primary  Signed, Zada Finders, MD  [facility] PGY-2 03/10/2016, 8:28 AM   History and all data above reviewed.  Patient examined.  I agree with the findings as above.  Breathing  OKHurshel Party is sore.    The patient exam reveals COR:RRR  ,  Lungs: Mild decreased breath sounds at the bases  ,  Abd: Positive bowel sounds, no rebound no guarding, Ext Moderately severe edema  .  All available labs, radiology testing, previous records reviewed. Agree with documented assessment and plan. Volume overload:  I am sure that there is a component of diastolic HF but that this is not a major contributor.  This appears to be mostly renal.  Continue current diuresis.  We will consider further work up of the elevated troponin as an outpatient.     Jeneen Rinks Trishna Cwik  10:04 AM  03/10/2016

## 2016-03-10 NOTE — Clinical Social Work Note (Signed)
PT updated recommendations from SNF to [place].   CSW signing off. Consult again if any social work needs arise.  Dayton Scrape, Larry [name]

## 2016-03-10 NOTE — Clinical Social Work Note (Signed)
Clinical Social Work Assessment  Patient Details  Name: Larry [name] MRN: [id number] Date of Birth: [birth year]  Date of referral:  03/10/16               Reason for consult:  Facility Placement, Discharge Planning                Permission sought to share information with:  Facility Sport and exercise psychologist, Family Supports Permission granted to share information::  Yes, Verbal Permission Granted  Name::     Whitley Strycharz  Agency::  SNF's  Relationship::  Wife  Contact Information:  [phone]  Housing/Transportation Living arrangements for the past 2 months:  [place] of Information:  Patient, Medical Team Patient Interpreter Needed:  None Criminal Activity/Legal Involvement Pertinent to Current Situation/Hospitalization:  No - Comment as needed Significant Relationships:  Spouse, Neighbor Lives with:  Spouse Do you feel safe going back to the place where you live?  Yes Need for family participation in patient care:  Yes (Comment)  Care giving concerns:  PT recommending SNF once medically stable for discharge.   Social Worker assessment / plan:  CSW met with patient. No supports at bedside. CSW and patient are familiar with each other from yesterday's conversation about SNF. Per MD in progression this morning, patient is now willing to consider SNF. Per patient, he is only willing to consider if it is "absolutely necessary." He still prefers to go home but gave permission to CSW to fax out to SNF's in the event he needs it at time of discharge. No further concerns. CSW encouraged patient to contact CSW as needed. CSW will continue to follow patient and facilitate discharge to SNF once medically stable, if agreeable.  Employment status:  Retired Forensic scientist:  Medicare PT Recommendations:  [place] / Referral to community resources:  [place]  Patient/Family's Response to care:  Patient not agreeable to SNF  but willing to consider if it is "absolutely necessary." Patient's family and neighbors supportive and involved in patient's care. Patient appreciated social work intervention.  Patient/Family's Understanding of and Emotional Response to Diagnosis, Current Treatment, and Prognosis:  Patient understands the reason for the SNF recommendation but prefers to go home once stable for discharge. Patient appears happy with hospital care.  Emotional Assessment Appearance:  Appears stated age Attitude/Demeanor/Rapport:  Other (Pleasant) Affect (typically observed):  Accepting, Appropriate, Calm, Pleasant Orientation:  Oriented to Self, Oriented to Place, Oriented to  Time, Oriented to Situation Alcohol / Substance use:  Never Used Psych involvement (Current and /or in the community):  No (Comment)  Discharge Needs  Concerns to be addressed:  Care Coordination Readmission within the last 30 days:  No Current discharge risk:  Dependent with Mobility Barriers to Discharge:  No Barriers Identified   Candie Chroman, LCSW 03/10/2016, 10:35 AM

## 2016-03-10 NOTE — Care Management Important Message (Signed)
Important Message  Patient Details  Name: Larry [name] MRN: [id number] Date of Birth: 1941-02-17   Medicare Important Message Given:  Yes    Kacee Sukhu Montine Circle 03/10/2016, 11:37 AM

## 2016-03-10 NOTE — Clinical Social Work Placement (Signed)
   CLINICAL SOCIAL WORK PLACEMENT  NOTE  Date:  03/10/2016  Patient Details  Name: Larry [name] MRN: [id number] Date of Birth: 1940/10/18  Clinical Social Work is seeking post-discharge placement for this patient at the [place] level of care (*CSW will initial, date and re-position this form in  chart as items are completed):  Yes   Patient/family provided with [facility] Work Department's list of facilities offering this level of care within the geographic area requested by the patient (or if unable, by the patient's family).  Yes   Patient/family informed of their freedom to choose among providers that offer the needed level of care, that participate in Medicare, Medicaid or managed care program needed by the patient, have an available bed and are willing to accept the patient.  Yes   Patient/family informed of [facility]'s ownership interest in [facility] and [facility], as well as of the fact that they are under no obligation to receive care at these facilities.  PASRR submitted to EDS on 03/10/16     PASRR number received on       Existing PASRR number confirmed on 03/10/16     FL2 transmitted to all facilities in geographic area requested by pt/family on 03/10/16     FL2 transmitted to all facilities within larger geographic area on       Patient informed that his/her managed care company has contracts with or will negotiate with certain facilities, including the following:            Patient/family informed of bed offers received.  Patient chooses bed at       Physician recommends and patient chooses bed at      Patient to be transferred to   on  .  Patient to be transferred to facility by       Patient family notified on   of transfer.  Name of family member notified:        PHYSICIAN Please sign FL2     Additional Comment:    _______________________________________________ Candie Chroman, LCSW 03/10/2016,  10:38 AM

## 2016-03-10 NOTE — Progress Notes (Signed)
Physical Therapy Treatment Patient Details Name: Larry [name] MRN: [id number] DOB: [birth year] Today's Date: 03/10/2016    History of Present Illness Pt is a 75 y/o male admitted secondary to weakness, acute CHF and fluid overload. PMH including but not limited to Non-Hodgkin lymphoma, DM, HTN, and CKD.    PT Comments    Pt presented sitting OOB on bedside commode when PT entered room. Pt's wife was present throughout session as well. Pt making good progress during this session and was able to ambulate 200' with RW and min guard. Additionally he ascended and descended one step with use of bilateral hand rails and min guard for safety. Pt was on RA throughout session. He did have a decrease in SPO2 to 78% during ambulation, but increased back up to 90-91% with standing rest break of approximately two minutes. Pt has made good progress and PT recommending that he is now able to safely return home with [facility] PT and supervision for mobility. Pt would continue to benefit from skilled physical therapy services at this time while admitted and after d/c to address his limitations in order to improve his overall safety and independence with functional mobility.   Follow Up Recommendations  Home health PT;Supervision for mobility/OOB     Equipment Recommendations  Rolling walker with 5" wheels;3in1 (PT)    Recommendations for Other Services       Precautions / Restrictions Precautions Precautions: Fall Restrictions Weight Bearing Restrictions: No    Mobility  Bed Mobility               General bed mobility comments: pt sitting OOB on bedside commode when PT entered room  Transfers Overall transfer level: Needs assistance Equipment used: Rolling walker (2 wheeled) Transfers: Sit to/from Stand Sit to Stand: Min guard         General transfer comment: pt required increased time, good hand placement and min guard for safety  Ambulation/Gait Ambulation/Gait assistance: Min  guard Ambulation Distance (Feet): 200 Feet Assistive device: Rolling walker (2 wheeled) Gait Pattern/deviations: Step-through pattern;Decreased stride length;Trunk flexed Gait velocity: decreased   General Gait Details: pt demonstrated safety with use of RW; however, pt's SPO2 dropped to 78% on RA during ambulation.    Stairs Stairs: Yes Stairs assistance: Min guard Stair Management: Two rails;Step to pattern;Forwards Number of Stairs: 1    Wheelchair Mobility    Modified Rankin (Stroke Patients Only)       Balance Overall balance assessment: Needs assistance Sitting-balance support: Feet supported;No upper extremity supported Sitting balance-Leahy Scale: Fair     Standing balance support: During functional activity;Bilateral upper extremity supported Standing balance-Leahy Scale: Poor Standing balance comment: pt reliant on bilateral UEs on RW                    Cognition Arousal/Alertness: Awake/alert Behavior During Therapy: WFL for tasks assessed/performed Overall Cognitive Status: Within Functional Limits for tasks assessed                      Exercises      General Comments        Pertinent Vitals/Pain Pain Assessment: No/denies pain    Home Living                      Prior Function            PT Goals (current goals can now be found in the care plan section) Acute Rehab PT Goals Patient  Stated Goal: to return home PT Goal Formulation: With patient Time For Goal Achievement: 03/22/16 Potential to Achieve Goals: Good Progress towards PT goals: Progressing toward goals    Frequency    Min 3X/week      PT Plan Discharge plan needs to be updated    Co-evaluation             End of Session Equipment Utilized During Treatment: Gait belt Activity Tolerance: Patient tolerated treatment well Patient left: in chair;with call bell/phone within reach;with family/visitor present     Time: 1410-1448 PT Time  Calculation (min) (ACUTE ONLY): 38 min  Charges:  $Gait Training: 38-52 mins                    G CodesClearnce Sorrel Kadeisha Betsch 03/27/2016, 2:58 PM Sherie Don, [place], DPT [phone]

## 2016-03-10 NOTE — Progress Notes (Signed)
PROGRESS NOTE  Larry [name] [id number] DOB: [birth year] DOA: 03/07/2016 PCP: Harvie Junior, MD Brief History: 75 year old male with a history of GERD, diabetes mellitus type 2, non-Hodgkin's lymphoma, hypertension, hypogonadism with low testosterone levels presented with three-day history of fevers, chills, generalized weakness and worsening dyspnea on exertion. The patient states that he has had chronic lower extremity edema, but this has worsened in the past week. The patient states that he recently followed up with his nephrologist, Dr. Florene Glen. From that visit, the patient states that his furosemide was decreased from 40 mg twice a day to once daily for which he has been on for the past 4-5 days prior to admission. His furosemide was decreased due to worsening serum creatinine. Upon presentation, the patient was noted to have serum creatinine 2.99 with chest x-ray showing small to moderate bilateral pleural effusions. The patient was given furosemide 60 mg IV--he has received 2 doses up to this point.  Assessment/Plan: Acute diastolic CHF -He has a clinically complicatedpicture -I feel that his worsening renal function and nephrotic proteinuria contributing to his fluid overload with good LV function on echo -appreciate cardiology assistance -Urine protein creatinine ratio-->10.20-->nephrotic range -Echocardiogram--EF 55-60%, grade 2 DD -Daily I's and O's--NEG 3.7 L -Daily weights--NEG 7 lbs since admission--question accuracy of 11/10 weight -switch to lasix 160 mg bid per renal recommendation  Acute on chronic renal failure--CKD stage IV -Baseline creatinine 2.4-2.6 -Presenting creatinine 2.99 -Consult the patient's established nephrologist to assist management -Urine protein creatinine ratio--10.20 -will need to tolerate worse renal function for improved pulm function -AM renal panel  Elevated troponin -Likely demand ischemia in the setting of worsening renal  failure and decompensated heart failure -No chest pain presently -EKG personally reviewed--sinus rhythm, nonspecific T-wave change  Fever-->cellulitis of neck/carbuncles -Viral respiratory panel--neg -Source may also be cellulitis from the patient's neck--he has numerous carbuncles on his posterior neck  -Urinalysis is negative for pyuria  -continue cefazolin D#3 -switch to cephalexin in am 11/11  Hypertension -Continue amlodipine and Cardura -Anticipate improvement with diuresis  Diabetes mellitus type 2, controlled -Check hemoglobin A1c--5.6 -D/C CBGs    Disposition Plan:Home in 11/11 or 11/12 Family Communication: wife updated at bedside11/10/17   Consultants: Cardiology, nephrology  Code Status: FULL  DVT Prophylaxis: [place] Heparin   Procedures: As Listed in Progress Note Above  Antibiotics: None     Subjective: Patient denies fevers, chills, headache, chest pain, dyspnea, nausea, vomiting, diarrhea, abdominal pain, dysuria, hematuria, hematochezia, and melena.   Objective: Vitals:   03/10/16 1130 03/10/16 1158 03/10/16 1523 03/10/16 1546  BP: (!) 166/67 (!) 155/71 (!) 147/72 137/73  Pulse: 84 87    Resp: 18 18    Temp:  98.2 F (36.8 C) 97.5 F (36.4 C)   TempSrc:  Oral Oral   SpO2: 94% 93%    Weight:      Height:        Intake/Output Summary (Last 24 hours) at 03/10/16 1749 Last data filed at 03/10/16 1428  Gross per 24 hour  Intake              770 ml  Output             2715 ml  Net            -1945 ml   Weight change: -3.583 kg (-7 lb 14.4 oz) Exam:   General:  Pt is alert, follows commands appropriately, not in acute distress  HEENT: No icterus, No thrush, No neck mass, [place]/AT  Cardiovascular: RRR, S1/S2, no rubs, no gallops  Respiratory: fine bibasilar crackles, no wheeze  Abdomen: Soft/+BS, non tender, non distended, no guarding  Extremities: 2 + LE edema, No lymphangitis, No petechiae, No rashes, no  synovitis   Data Reviewed: I have personally reviewed following labs and imaging studies Basic Metabolic Panel:  Recent Labs Lab 03/07/16 1436 03/08/16 0252 03/09/16 0358 03/10/16 0513  NA 137 135 136 136  K 5.0 4.7 4.7 4.7  CL 107 104 103 103  CO2 23 22 25 25   GLUCOSE 157* 156* 83 78  BUN 38* 42* 44* 46*  CREATININE 2.99* 3.12* 3.28* 3.34*  CALCIUM 8.8* 9.0 8.9 8.7*  MG  --   --  1.8  --   PHOS  --   --  4.3 4.2   Liver Function Tests:  Recent Labs Lab 03/07/16 1436 03/09/16 0358 03/10/16 0513  AST 16  --   --   ALT 14*  --   --   ALKPHOS 81  --   --   BILITOT 1.1  --   --   PROT 6.3*  --   --   ALBUMIN 3.1* 2.7* 2.6*   No results for input(s): LIPASE, AMYLASE in the last 168 hours. No results for input(s): AMMONIA in the last 168 hours. Coagulation Profile: No results for input(s): INR, PROTIME in the last 168 hours. CBC:  Recent Labs Lab 03/07/16 1436 03/10/16 0513  WBC 8.9 8.6  NEUTROABS 6.9  --   HGB 9.3* 9.5*  HCT 27.2* 27.4*  MCV 99.6 100.0  PLT 189 200   Cardiac Enzymes:  Recent Labs Lab 03/07/16 1436 03/07/16 2032 03/08/16 0252  TROPONINI 0.08* 0.13* 0.13*   BNP: Invalid input(s): POCBNP CBG:  Recent Labs Lab 03/08/16 2127 03/09/16 0601 03/09/16 1122 03/09/16 1712 03/09/16 2111  GLUCAP 115* 83 104* 144* 122*   HbA1C:  Recent Labs  03/08/16 1024  HGBA1C 5.6   Urine analysis:    Component Value Date/Time   COLORURINE YELLOW 03/07/2016 1535   APPEARANCEUR CLEAR 03/07/2016 1535   LABSPEC 1.015 03/07/2016 1535   PHURINE 6.0 03/07/2016 1535   GLUCOSEU NEGATIVE 03/07/2016 1535   HGBUR SMALL (A) 03/07/2016 1535   BILIRUBINUR NEGATIVE 03/07/2016 1535   KETONESUR NEGATIVE 03/07/2016 1535   PROTEINUR >300 (A) 03/07/2016 1535   UROBILINOGEN 0.2 08/11/2011 1050   NITRITE NEGATIVE 03/07/2016 1535   LEUKOCYTESUR NEGATIVE 03/07/2016 1535   Sepsis Labs: @LABRCNTIP (procalcitonin:4,lacticidven:4) ) Recent Results (from the  past 240 hour(s))  Blood culture (routine x 2)     Status: None (Preliminary result)   Collection Time: 03/07/16  2:30 PM  Result Value Ref Range Status   Specimen Description BLOOD RIGHT ANTECUBITAL  Final   Special Requests BOTTLES DRAWN AEROBIC AND ANAEROBIC 5CC  Final   Culture NO GROWTH 3 DAYS  Final   Report Status PENDING  Incomplete  Blood culture (routine x 2)     Status: None (Preliminary result)   Collection Time: 03/07/16  2:39 PM  Result Value Ref Range Status   Specimen Description BLOOD RIGHT HAND  Final   Special Requests BOTTLES DRAWN AEROBIC ONLY 10CC  Final   Culture NO GROWTH 3 DAYS  Final   Report Status PENDING  Incomplete  Urine culture     Status: Abnormal   Collection Time: 03/07/16  3:25 PM  Result Value Ref Range Status   Specimen Description URINE, RANDOM  Final  Special Requests NONE  Final   Culture MULTIPLE SPECIES PRESENT, SUGGEST RECOLLECTION (A)  Final   Report Status 03/08/2016 FINAL  Final  Respiratory Panel by PCR     Status: None   Collection Time: 03/07/16  4:53 PM  Result Value Ref Range Status   Adenovirus NOT DETECTED NOT DETECTED Final   Coronavirus 229E NOT DETECTED NOT DETECTED Final   Coronavirus HKU1 NOT DETECTED NOT DETECTED Final   Coronavirus NL63 NOT DETECTED NOT DETECTED Final   Coronavirus OC43 NOT DETECTED NOT DETECTED Final   Metapneumovirus NOT DETECTED NOT DETECTED Final   Rhinovirus / Enterovirus NOT DETECTED NOT DETECTED Final   Influenza A NOT DETECTED NOT DETECTED Final   Influenza B NOT DETECTED NOT DETECTED Final   Parainfluenza Virus 1 NOT DETECTED NOT DETECTED Final   Parainfluenza Virus 2 NOT DETECTED NOT DETECTED Final   Parainfluenza Virus 3 NOT DETECTED NOT DETECTED Final   Parainfluenza Virus 4 NOT DETECTED NOT DETECTED Final   Respiratory Syncytial Virus NOT DETECTED NOT DETECTED Final   Bordetella pertussis NOT DETECTED NOT DETECTED Final   Chlamydophila pneumoniae NOT DETECTED NOT DETECTED Final    Mycoplasma pneumoniae NOT DETECTED NOT DETECTED Final     Scheduled Meds: . allopurinol  100 mg Oral Daily  . amLODipine  10 mg Oral Daily  . aspirin EC  81 mg Oral Daily  .  ceFAZolin (ANCEF) IV  1 g Intravenous Q12H  . doxazosin  8 mg Oral BID  . feeding supplement (NEPRO CARB STEADY)  237 mL Oral BID BM  . furosemide  160 mg Oral BID  . heparin  5,000 Units Subcutaneous Q8H  . levothyroxine  175 mcg Oral QAC breakfast  . linagliptin  5 mg Oral Daily  . pantoprazole  40 mg Oral Daily  . sodium chloride flush  3 mL Intravenous Q12H   Continuous Infusions:  Procedures/Studies: Dg Chest 2 View  Result Date: 03/07/2016 CLINICAL DATA:  Weakness and body aches for 3 days. Low grade fever. EXAM: CHEST  2 VIEW COMPARISON:  PA and lateral chest 04/07/2015.  CT chest 06/01/2010. FINDINGS: The patient has small to moderate bilateral pleural effusions, larger on the left. Indistinctness of the pulmonary vasculature is present bilaterally with more focal airspace opacities in the lung bases, worse on the left. No pneumothorax. There is mild cardiomegaly. IMPRESSION: Findings most compatible with congestive heart failure with associated small to moderate bilateral pleural effusions. Electronically Signed   By: Inge Rise M.D.   On: 03/07/2016 15:57    Berel Najjar, DO  [facility] Pager [phone]  If 7PM-7AM, please contact night-coverage [email or web address] Password [email or web address] 03/10/2016, 5:49 PM   LOS: 3 days

## 2016-03-10 NOTE — NC FL2 (Signed)
[place] MEDICAID FL2 LEVEL OF CARE SCREENING TOOL     IDENTIFICATION  Patient Name: Larry [name] Birthdate: July 03, 1940 Sex: male Admission Date (Current Location): 03/07/2016  [facility] and [place] Number:  Herbalist and Address:  [facility] [place] [place] [address] [place], [place] [address]      Provider Number: [id number]  Attending Physician Name and Address:  Orson Eva, MD  Relative Name and Phone Number:       Current Level of Care: Hospital Recommended Level of Care: [place] Prior Approval Number:    Date Approved/Denied:   PASRR Number: [id number]  Discharge Plan: SNF    Current Diagnoses: Patient Active Problem List   Diagnosis Date Noted  . Cellulitis, neck 03/09/2016  . Acute congestive heart failure ([place])   . Pressure injury of skin 03/08/2016  . Acute renal failure superimposed on stage 4 chronic kidney disease ([place]) 03/08/2016  . CHF (congestive heart failure) ([place]) 03/07/2016  . Elevated troponin 03/07/2016  . Acute kidney injury superimposed on CKD ([place]) 03/07/2016  . Fever 03/07/2016  . Urinary retention s/p foley x2 08/19/2011  . Acute renal failure ([place]) 08/19/2011  . Chronic renal insufficiency, stage II (mild) 08/19/2011  . Duodenal ulcer, perforated, s/p Omental patch [id number] 08/11/2011  . 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 Hypotestosteronism 03/29/2011  . Diabetes mellitus ([place]) 08/28/2007  . Essential hypertension 08/28/2007  . ALLERGIC RHINITIS 08/28/2007  . PLEURAL EFFUSION, LEFT 08/28/2007    Orientation RESPIRATION BLADDER Height & Weight     Self, Time, Situation, Place  Normal Continent Weight: 189 lb 12.8 oz (86.1 kg) Height:  5\' 8"  (172.7 cm)  BEHAVIORAL SYMPTOMS/MOOD NEUROLOGICAL BOWEL NUTRITION STATUS   (None)  (None) Continent Diet (Heart healthy/carb-modified)  AMBULATORY STATUS COMMUNICATION OF NEEDS Skin   Limited Assist Verbally Other  (Comment) (Pressure injury Stage 1 lower buttocks: Foam prn)                       Personal Care Assistance Level of Assistance              Functional Limitations Info  Sight, Hearing, Speech Sight Info: Adequate Hearing Info: Adequate Speech Info: Adequate    SPECIAL CARE FACTORS FREQUENCY  PT (By licensed PT), Blood pressure, Diabetic urine testing     PT Frequency: 5 x week              Contractures Contractures Info: Not present    Additional Factors Info  Code Status, Allergies Code Status Info: Full Allergies Info: Iohexol           Current Medications (03/10/2016):  This is the current hospital active medication list Current Facility-Administered Medications  Medication Dose Route Frequency Provider Last Rate Last Dose  . 0.9 %  sodium chloride infusion  250 mL Intravenous PRN Albertine Patricia, MD      . acetaminophen (TYLENOL) tablet 650 mg  650 mg Oral Q4H PRN Albertine Patricia, MD   650 mg at 03/08/16 0647  . albuterol (PROVENTIL) (2.5 MG/3ML) 0.083% nebulizer solution 5 mg  5 mg Nebulization Q6H PRN Jeryl Columbia, NP   5 mg at 03/08/16 0017  . allopurinol (ZYLOPRIM) tablet 100 mg  100 mg Oral Daily Albertine Patricia, MD   100 mg at 03/09/16 1033  . amLODipine (NORVASC) tablet 10 mg  10 mg Oral Daily Albertine Patricia, MD   10 mg at  03/09/16 1029  . aspirin EC tablet 81 mg  81 mg Oral Daily Albertine Patricia, MD   81 mg at 03/09/16 1033  . ceFAZolin (ANCEF) IVPB 1 g/50 mL premix  1 g Intravenous Q12H Cecilio Asper Batchelder, RPH   1 g at 03/09/16 2128  . doxazosin (CARDURA) tablet 8 mg  8 mg Oral BID Albertine Patricia, MD   8 mg at 03/09/16 2128  . furosemide (LASIX) injection 80 mg  80 mg Intravenous BID Asiyah Cletis Media, MD   80 mg at 03/10/16 0848  . heparin injection 5,000 Units  5,000 Units Subcutaneous Q8H Silver Huguenin Elgergawy, MD   5,000 Units at 03/10/16 0533  . hydrALAZINE (APRESOLINE) injection 10 mg  10 mg Intravenous Q4H PRN  Albertine Patricia, MD   10 mg at 03/07/16 2320  . levothyroxine (SYNTHROID, LEVOTHROID) tablet 175 mcg  175 mcg Oral QAC breakfast Albertine Patricia, MD   175 mcg at 03/10/16 0533  . linagliptin (TRADJENTA) tablet 5 mg  5 mg Oral Daily Albertine Patricia, MD   5 mg at 03/09/16 1033  . ondansetron (ZOFRAN) injection 4 mg  4 mg Intravenous Q6H PRN Albertine Patricia, MD      . pantoprazole (PROTONIX) EC tablet 40 mg  40 mg Oral Daily Albertine Patricia, MD   40 mg at 03/09/16 1033  . sodium chloride flush (NS) 0.9 % injection 3 mL  3 mL Intravenous Q12H Albertine Patricia, MD   3 mL at 03/09/16 2129  . sodium chloride flush (NS) 0.9 % injection 3 mL  3 mL Intravenous PRN Albertine Patricia, MD         Discharge Medications: Please see discharge summary for a list of discharge medications.  Relevant Imaging Results:  Relevant Lab Results:   Additional Information SS#: [id number]  Candie Chroman, LCSW

## 2016-03-11 DIAGNOSIS — E1121 Type 2 diabetes mellitus with diabetic nephropathy: Secondary | ICD-10-CM

## 2016-03-11 DIAGNOSIS — N170 Acute kidney failure with tubular necrosis: Secondary | ICD-10-CM

## 2016-03-11 DIAGNOSIS — R748 Abnormal levels of other serum enzymes: Secondary | ICD-10-CM

## 2016-03-11 DIAGNOSIS — N184 Chronic kidney disease, stage 4 (severe): Secondary | ICD-10-CM

## 2016-03-11 DIAGNOSIS — I10 Essential (primary) hypertension: Secondary | ICD-10-CM

## 2016-03-11 DIAGNOSIS — L0211 Cutaneous abscess of neck: Secondary | ICD-10-CM

## 2016-03-11 DIAGNOSIS — L0293 Carbuncle, unspecified: Secondary | ICD-10-CM

## 2016-03-11 LAB — RENAL FUNCTION PANEL
ALBUMIN: 2.5 g/dL — AB (ref 3.5–5.0)
Anion gap: 9 (ref 5–15)
BUN: 47 mg/dL — AB (ref 6–20)
CALCIUM: 9 mg/dL (ref 8.9–10.3)
CO2: 28 mmol/L (ref 22–32)
CREATININE: 3.28 mg/dL — AB (ref 0.61–1.24)
Chloride: 100 mmol/L — ABNORMAL LOW (ref 101–111)
GFR calc Af Amer: 20 mL/min — ABNORMAL LOW (ref >60)
GFR calc non Af Amer: 17 mL/min — ABNORMAL LOW (ref >60)
GLUCOSE: 85 mg/dL (ref 65–99)
PHOSPHORUS: 4 mg/dL (ref 2.5–4.6)
POTASSIUM: 4.2 mmol/L (ref 3.5–5.1)
SODIUM: 137 mmol/L (ref 135–145)

## 2016-03-11 NOTE — Progress Notes (Signed)
Offered to continue watching Videos, however pt does not want to continue until his wife is at the bedside.

## 2016-03-11 NOTE — Progress Notes (Signed)
PROGRESS NOTE  Larry [name] [id number] DOB: 1941/01/18 DOA: 03/07/2016 PCP: Harvie Junior, MD Brief History: 75 year old male with a history of GERD, diabetes mellitus type 2, non-Hodgkin's lymphoma, hypertension, hypogonadism with low testosterone levels presented with three-day history of fevers, chills, generalized weakness and worsening dyspnea on exertion. The patient states that he has had chronic lower extremity edema, but this has worsened in the past week. The patient states that he recently followed up with his nephrologist, Dr. Florene Glen. From that visit, the patient states that his furosemide was decreased from 40 mg twice a day to once daily for which he has been on for the past 4-5 days prior to admission. His furosemide was decreased due to worsening serum creatinine. Upon presentation, the patient was noted to have serum creatinine 2.99 with chest x-ray showing small to moderate bilateral pleural effusions. The patient was given furosemide 60 mg IV--he has received 2 doses up to this point.  Assessment/Plan: Acute diastolic CHF -He has a clinically complicatedpicture -I feel that his worsening renal function and nephrotic proteinuria  contributing to his fluid overload with good LV function on echo -appreciate cardiology assistance -Urine protein creatinine ratio-->10.20-->nephrotic range -Echocardiogram--EF 55-60%, grade 2 DD -Daily I's and O's--NEG 4.9 L -Daily weights--NEG 7 lbs since admission--question accuracy of 11/10 and 11/11 weights -switch to lasix 160 mg bid per renal recommendation 03/11/16  Acute on chronic renal failure--CKD stage IV -Baseline creatinine 2.4-2.6 -Presenting creatinine 2.99 -appreciate renal assistance -Urine protein creatinine ratio--10.20 -will need to tolerate worse renal function for improved pulm function--now with new baseline -AM renal panel  Elevated troponin -Likely demand ischemia in the setting of worsening  renal failure and decompensated heart failure -No chest pain presently -EKG personally reviewed--sinus rhythm, nonspecific T-wave change  Fever-->cellulitis of neck/carbuncles -Viral respiratory panel--neg -Source may also be cellulitis from the patient's neck--he has numerous carbuncles on his posterior neck  -Urinalysis is negative for pyuria  -finished cefazolin D#3 -switch to cephalexin 11/11  Hypertension -Continue amlodipine and Cardura -Anticipate improvement with diuresis  Diabetes mellitus type 2, controlled -Check hemoglobin A1c--5.6 -D/C CBGs    Disposition Plan:Home when cleared by cardiology and nephrology Family Communication: wife updatedat bedside11/11/17   Consultants: Cardiology, nephrology  Code Status: FULL  DVT Prophylaxis: [place] Heparin   Procedures: As Listed in Progress Note Above  Antibiotics: Cefazolin 11/8>>11/10 Cephalexin 11/11>>>  Subjective: Patient denies fevers, chills, headache, chest pain, dyspnea, nausea, vomiting, diarrhea, abdominal pain, dysuria, hematuria, hematochezia, and melena.   Objective: Vitals:   03/10/16 1546 03/10/16 2100 03/11/16 0516 03/11/16 1300  BP: 137/73 140/67 (!) 148/76 (!) 108/57  Pulse:  82 80 72  Resp:   18 18  Temp:  98 F (36.7 C) 98.1 F (36.7 C) 98.3 F (36.8 C)  TempSrc:  Oral Oral Oral  SpO2:  94% 93% 94%  Weight:   80.9 kg (178 lb 4.8 oz)   Height:        Intake/Output Summary (Last 24 hours) at 03/11/16 1731 Last data filed at 03/11/16 1400  Gross per 24 hour  Intake              560 ml  Output             1701 ml  Net            -1141 ml   Weight change: -5.216 kg (-11 lb 8 oz) Exam:   General:  Pt is  alert, follows commands appropriately, not in acute distress  HEENT: No icterus, No thrush, No neck mass, [place]/AT  Cardiovascular: RRR, S1/S2, no rubs, no gallops  Respiratory: Bibasilar rales. No wheezing. Good air movement.  Abdomen: Soft/+BS, non tender,  non distended, no guarding  Extremities: 2+ LE edema, No lymphangitis, No petechiae, No rashes, no synovitis   Data Reviewed: I have personally reviewed following labs and imaging studies Basic Metabolic Panel:  Recent Labs Lab 03/07/16 1436 03/08/16 0252 03/09/16 0358 03/10/16 0513 03/11/16 0451  NA 137 135 136 136 137  K 5.0 4.7 4.7 4.7 4.2  CL 107 104 103 103 100*  CO2 23 22 25 25 28   GLUCOSE 157* 156* 83 78 85  BUN 38* 42* 44* 46* 47*  CREATININE 2.99* 3.12* 3.28* 3.34* 3.28*  CALCIUM 8.8* 9.0 8.9 8.7* 9.0  MG  --   --  1.8  --   --   PHOS  --   --  4.3 4.2 4.0   Liver Function Tests:  Recent Labs Lab 03/07/16 1436 03/09/16 0358 03/10/16 0513 03/11/16 0451  AST 16  --   --   --   ALT 14*  --   --   --   ALKPHOS 81  --   --   --   BILITOT 1.1  --   --   --   PROT 6.3*  --   --   --   ALBUMIN 3.1* 2.7* 2.6* 2.5*   No results for input(s): LIPASE, AMYLASE in the last 168 hours. No results for input(s): AMMONIA in the last 168 hours. Coagulation Profile: No results for input(s): INR, PROTIME in the last 168 hours. CBC:  Recent Labs Lab 03/07/16 1436 03/10/16 0513  WBC 8.9 8.6  NEUTROABS 6.9  --   HGB 9.3* 9.5*  HCT 27.2* 27.4*  MCV 99.6 100.0  PLT 189 200   Cardiac Enzymes:  Recent Labs Lab 03/07/16 1436 03/07/16 2032 03/08/16 0252  TROPONINI 0.08* 0.13* 0.13*   BNP: Invalid input(s): POCBNP CBG:  Recent Labs Lab 03/08/16 2127 03/09/16 0601 03/09/16 1122 03/09/16 1712 03/09/16 2111  GLUCAP 115* 83 104* 144* 122*   HbA1C: No results for input(s): HGBA1C in the last 72 hours. Urine analysis:    Component Value Date/Time   COLORURINE YELLOW 03/07/2016 1535   APPEARANCEUR CLEAR 03/07/2016 1535   LABSPEC 1.015 03/07/2016 1535   PHURINE 6.0 03/07/2016 1535   GLUCOSEU NEGATIVE 03/07/2016 1535   HGBUR SMALL (A) 03/07/2016 1535   BILIRUBINUR NEGATIVE 03/07/2016 1535   KETONESUR NEGATIVE 03/07/2016 1535   PROTEINUR >300 (A)  03/07/2016 1535   UROBILINOGEN 0.2 08/11/2011 1050   NITRITE NEGATIVE 03/07/2016 1535   LEUKOCYTESUR NEGATIVE 03/07/2016 1535   Sepsis Labs: @LABRCNTIP (procalcitonin:4,lacticidven:4) ) Recent Results (from the past 240 hour(s))  Blood culture (routine x 2)     Status: None (Preliminary result)   Collection Time: 03/07/16  2:30 PM  Result Value Ref Range Status   Specimen Description BLOOD RIGHT ANTECUBITAL  Final   Special Requests BOTTLES DRAWN AEROBIC AND ANAEROBIC 5CC  Final   Culture NO GROWTH 4 DAYS  Final   Report Status PENDING  Incomplete  Blood culture (routine x 2)     Status: None (Preliminary result)   Collection Time: 03/07/16  2:39 PM  Result Value Ref Range Status   Specimen Description BLOOD RIGHT HAND  Final   Special Requests BOTTLES DRAWN AEROBIC ONLY 10CC  Final   Culture NO GROWTH 4 DAYS  Final   Report Status PENDING  Incomplete  Urine culture     Status: Abnormal   Collection Time: 03/07/16  3:25 PM  Result Value Ref Range Status   Specimen Description URINE, RANDOM  Final   Special Requests NONE  Final   Culture MULTIPLE SPECIES PRESENT, SUGGEST RECOLLECTION (A)  Final   Report Status 03/08/2016 FINAL  Final  Respiratory Panel by PCR     Status: None   Collection Time: 03/07/16  4:53 PM  Result Value Ref Range Status   Adenovirus NOT DETECTED NOT DETECTED Final   Coronavirus 229E NOT DETECTED NOT DETECTED Final   Coronavirus HKU1 NOT DETECTED NOT DETECTED Final   Coronavirus NL63 NOT DETECTED NOT DETECTED Final   Coronavirus OC43 NOT DETECTED NOT DETECTED Final   Metapneumovirus NOT DETECTED NOT DETECTED Final   Rhinovirus / Enterovirus NOT DETECTED NOT DETECTED Final   Influenza A NOT DETECTED NOT DETECTED Final   Influenza B NOT DETECTED NOT DETECTED Final   Parainfluenza Virus 1 NOT DETECTED NOT DETECTED Final   Parainfluenza Virus 2 NOT DETECTED NOT DETECTED Final   Parainfluenza Virus 3 NOT DETECTED NOT DETECTED Final   Parainfluenza Virus 4  NOT DETECTED NOT DETECTED Final   Respiratory Syncytial Virus NOT DETECTED NOT DETECTED Final   Bordetella pertussis NOT DETECTED NOT DETECTED Final   Chlamydophila pneumoniae NOT DETECTED NOT DETECTED Final   Mycoplasma pneumoniae NOT DETECTED NOT DETECTED Final     Scheduled Meds: . allopurinol  100 mg Oral Daily  . amLODipine  10 mg Oral Daily  . aspirin EC  81 mg Oral Daily  . cephALEXin  500 mg Oral Q12H  . doxazosin  8 mg Oral BID  . feeding supplement (NEPRO CARB STEADY)  237 mL Oral BID BM  . furosemide  160 mg Oral BID  . heparin  5,000 Units Subcutaneous Q8H  . levothyroxine  175 mcg Oral QAC breakfast  . linagliptin  5 mg Oral Daily  . pantoprazole  40 mg Oral Daily  . sodium chloride flush  3 mL Intravenous Q12H   Continuous Infusions:  Procedures/Studies: Dg Chest 2 View  Result Date: 03/07/2016 CLINICAL DATA:  Weakness and body aches for 3 days. Low grade fever. EXAM: CHEST  2 VIEW COMPARISON:  PA and lateral chest 04/07/2015.  CT chest 06/01/2010. FINDINGS: The patient has small to moderate bilateral pleural effusions, larger on the left. Indistinctness of the pulmonary vasculature is present bilaterally with more focal airspace opacities in the lung bases, worse on the left. No pneumothorax. There is mild cardiomegaly. IMPRESSION: Findings most compatible with congestive heart failure with associated small to moderate bilateral pleural effusions. Electronically Signed   By: Inge Rise M.D.   On: 03/07/2016 15:57    Dayshia Ballinas, DO  [facility] Pager [phone]  If 7PM-7AM, please contact night-coverage [email or web address] Password [email or web address] 03/11/2016, 5:31 PM   LOS: 4 days

## 2016-03-11 NOTE — Progress Notes (Addendum)
Patient Name: Larry [name] Date of Encounter: 03/11/2016  Primary Cardiologist: Dr. Shara Blazing Problem List     Active Problems:   Diabetes mellitus [facility])   Essential hypertension   CHF (congestive heart failure) (HCC)   Elevated troponin   Acute kidney injury superimposed on CKD (HCC)   Fever   Pressure injury of skin   Acute renal failure superimposed on stage 4 chronic kidney disease (HCC)   Cellulitis, neck   Acute congestive heart failure (HCC)   Furunculosis   Acute diastolic CHF (congestive heart failure) ([place])   Subjective   Patient feels well this morning, continues to feel improved. The legs are significantly improved and so is breathing.   Inpatient Medications    Scheduled Meds: . allopurinol  100 mg Oral Daily  . amLODipine  10 mg Oral Daily  . aspirin EC  81 mg Oral Daily  . cephALEXin  500 mg Oral Q12H  . doxazosin  8 mg Oral BID  . feeding supplement (NEPRO CARB STEADY)  237 mL Oral BID BM  . furosemide  160 mg Oral BID  . heparin  5,000 Units Subcutaneous Q8H  . levothyroxine  175 mcg Oral QAC breakfast  . linagliptin  5 mg Oral Daily  . pantoprazole  40 mg Oral Daily  . sodium chloride flush  3 mL Intravenous Q12H   Continuous Infusions:  PRN Meds: sodium chloride, acetaminophen, albuterol, hydrALAZINE, ondansetron (ZOFRAN) IV, sodium chloride flush   Vital Signs    Vitals:   03/10/16 1523 03/10/16 1546 03/10/16 2100 03/11/16 0516  BP: (!) 147/72 137/73 140/67 (!) 148/76  Pulse:   82 80  Resp:    18  Temp: 97.5 F (36.4 C)  98 F (36.7 C) 98.1 F (36.7 C)  TempSrc: Oral  Oral Oral  SpO2:   94% 93%  Weight:    178 lb 4.8 oz (80.9 kg)  Height:        Intake/Output Summary (Last 24 hours) at 03/11/16 1152 Last data filed at 03/11/16 1029  Gross per 24 hour  Intake              560 ml  Output             1700 ml  Net            -1140 ml   Filed Weights   03/09/16 0522 03/10/16 0600 03/11/16 0516  Weight: 197 lb 11.2  oz (89.7 kg) 189 lb 12.8 oz (86.1 kg) 178 lb 4.8 oz (80.9 kg)    Physical Exam    General: Well developed, well nourished, male in no acute distress Lungs: Resp regular and unlabored, distant breath sounds, clear to auscultation Heart: RRR, 3/6 late peaking systolic murmur heard loudest at the RUS border   Neck: No lymphadenopathy, JVD present. Abdomen: Abdomen soft and non-tender without masses or hernias noted. Extremities: knee high stockings on, edema is improving, . Radials 2+ and equal bilaterally Neuro: Alert and oriented X 3. No focal deficits noted. Psych:  Good affect, responds appropriately   Labs    CBC  Recent Labs  03/10/16 0513  WBC 8.6  HGB 9.5*  HCT 27.4*  MCV 100.0  PLT [id number]   Basic Metabolic Panel  Recent Labs  03/09/16 0358 03/10/16 0513 03/11/16 0451  NA 136 136 137  K 4.7 4.7 4.2  CL 103 103 100*  CO2 25 25 28   GLUCOSE 83 78 85  BUN 44* 46* 47*  CREATININE 3.28* 3.34* 3.28*  CALCIUM 8.9 8.7* 9.0  MG 1.8  --   --   PHOS 4.3 4.2 4.0   Liver Function Tests  Recent Labs  03/10/16 0513 03/11/16 0451  ALBUMIN 2.6* 2.5*    Recent Labs  03/08/16 1505  TSH 3.047    Telemetry    Sinus rhythm, no Vtach - Personally Reviewed  ECG    Sinus rhythm, no acute ischemic changes - Personally Reviewed  Radiology    No results found.  Cardiac Studies   TTE 03/09/16: - Left ventricle: The cavity size was normal. Systolic function was   normal. The estimated ejection fraction was in the range of 55%   to 60%. Wall motion was normal; there were no regional wall   motion abnormalities. Features are consistent with a pseudonormal   left ventricular filling pattern, with concomitant abnormal   relaxation and increased filling pressure (grade 2 diastolic   dysfunction). Doppler parameters are consistent with high   ventricular filling pressure. - Aortic valve: Trileaflet; normal thickness, mildly calcified   leaflets. - Mitral valve: There  was mild regurgitation. - Left atrium: The atrium was mildly dilated.     Patient Profile     75 y.o. male with a history of Non-Hodgkin's Lymphoma, HTN, DM, CKD 3, hypothyroidism, and hypotestosteronism who presented with 3-4 days of malaise, generalized weakness, SOB, fevers, chills, and increase in his chronic lower extremity swelling.  Assessment & Plan    Diastolic CHF: Suspected chronic CHF with no prior Echo on file. Weight down 18 lbs .  TTE this admission shows EF 55-60%, pseudonormal LV filling pattern, grade 2 diastolic dysfunction. Continues to diurese well. - Agree with lasix 160 po BID. Still fluid overloaded. -Continue knee high ted hose -Continue to elevate legs above the level of the heart -Monitor daily standing weights -Strict I/Os -Monitor RFP for renal function and potassium  HTN: BP slightly elevated this morning. On home Amlodipine and Doxazosin. Has prn Hydralazine but has not received. -Lasix as above -Continue Amlodipine 10 mg daily -Hold ACE/ARB with renal function  Elevated Troponin: Likely demand ischemia from volume overload, troponin trend flat at 0.08 > 0.13 > 0.13.  Systolic murmur: Suspected moderate aortic stenosis with late peaking 123456 systolic murmur heard throughout, loudest at the RUS border with radiation to the neck. -TTE without evidence of aortic valve stenosis  Hypothyroidism: On Synthroid 175 mcg -TSH 3.047  Acute on Chronic Kidney Disease: Follows with Dr. Florene Glen. Creatinine essentially flat from 3.28 >> 3.34. -Diuresis, daily weights, I/Os, RFP as above -Nephrology following  Signed, Ena Dawley, MD  03/11/2016, 11:52 AM

## 2016-03-11 NOTE — Progress Notes (Signed)
[facility] Progress Note   Subjective:   Patient denies any dyspnea. He remains on RA and says he had no trouble working with PT yesterday, walking down the hall and up steps. He watched HD videos yesterday and had a few more logistical questions (how access is gained); he is hopeful that he will not need this soon.   Objective:   BP (!) 148/76 (BP Location: Right Arm)   Pulse 80   Temp 98.1 F (36.7 C) (Oral)   Resp 18   Ht 5\' 8"  (1.727 m)   Wt 178 lb 4.8 oz (80.9 kg) Comment: scale c  SpO2 93%   BMI 27.11 kg/m   Intake/Output Summary (Last 24 hours) at 03/11/16 0810 Last data filed at 03/11/16 [id number]  Gross per 24 hour  Intake              680 ml  Output             1775 ml  Net            -1095 ml   Weight change: -11 lb 8 oz (-5.216 kg)   Since admission: -5.0 L  Physical Exam: Constitutional: Older male, resting in bed, pleasant Cardiovascular: 3/6 systolic murmur loudest, RRR   Respiratory: Effort normal, CTAB this morning though decreased breath sounds over bases GI: Soft. No TTP. Bowel sounds are normal.  Musculoskeletal: 2+ pitting edema of bilateral leg to knee; 3/2+ edema to thigh  Skin: Skin is warm. Lipodermatoscelerosis of bilateral legs  Labs: BMET  Recent Labs Lab 03/07/16 1436 03/08/16 0252 03/09/16 0358 03/10/16 0513 03/11/16 0451  NA 137 135 136 136 137  K 5.0 4.7 4.7 4.7 4.2  CL 107 104 103 103 100*  CO2 23 22 25 25 28   GLUCOSE 157* 156* 83 78 85  BUN 38* 42* 44* 46* 47*  CREATININE 2.99* 3.12* 3.28* 3.34* 3.28*  CALCIUM 8.8* 9.0 8.9 8.7* 9.0  PHOS  --   --  4.3 4.2 4.0   CBC  Recent Labs Lab 03/07/16 1436 03/10/16 0513  WBC 8.9 8.6  NEUTROABS 6.9  --   HGB 9.3* 9.5*  HCT 27.2* 27.4*  MCV 99.6 100.0  PLT 189 200    Medications:    . allopurinol  100 mg Oral Daily  . amLODipine  10 mg Oral Daily  . aspirin EC  81 mg Oral Daily  . cephALEXin  500 mg Oral Q12H  . doxazosin  8 mg Oral BID  . feeding  supplement (NEPRO CARB STEADY)  237 mL Oral BID BM  . furosemide  160 mg Oral BID  . heparin  5,000 Units Subcutaneous Q8H  . levothyroxine  175 mcg Oral QAC breakfast  . linagliptin  5 mg Oral Daily  . pantoprazole  40 mg Oral Daily  . sodium chloride flush  3 mL Intravenous Q12H   Background: Last seen by Dr. Florene Glen on 10/15/2015 with SCr 2.37. Later rising 2.76-> 2.91 on 10/17 and 03/02/16 respectively. Admitted with CHF exacerbation, receiving diuresis. We are asked to see because of tenuous/worsening renal function with creatinine up to 3.12 11/8. Home lasix had been decreased earlier this month for worsening creatinine, as patient had not expressed progressive SOB and swelling, though pt says these symptoms started before changing medication dose. Kidneys responding adequately to lasix.    Assessment/ Plan:    1. AoCKD4 w proteinuria (likely due to HTN and T2DM): SCr elevated but stable. Now presenting with fluid  overload and worsening creatinine likely due to nephrotic syndrome. Baseline serum creatinine previously 2.5, however has been worsening over past several months (6/16  SCr 2.37, 10/19 2.76, 11/2 2.91). Most recent  SCr 2.99 > 3.12 > 3.28 > 3.34 > 3.28; electrolytes have remained stable.  - Volume overload - UOP 1.8 L with several unrecorded voids; weight improving 204 lbs> 202lbs > 197 lbs > 189 lbs > 178 lbs. Transitioned to PO lasix 160 BID this a.m. Has 03/15/16 followup scheduled with Dr. Florene Glen, so has timely outpt follow-up. Continue to monitor respiratory status and UOP with transition to PO.  - Watched dialysis videos  - Daily renal function panel   2. Anemia: Hgb 9.5. Iron 50 and TIBC 225 11/8. Feraheme x 1 on 11/10. Aranesp 60 mcg weekly. We will need to set up dosing in short stay when he sees Dr. Florene Glen in the clinic.   3. HTN: hypertensive since admission, SBPs 150s; currently on amlodipine 10 mg, doxazosin 8 mg BID; continue diuresis.    4. T2DM: A1C 6.9 (2013)  sliding scale + tradjenta  5 mg daily   5. Metabolic Bone Disease: PTH slightly elevated at 132. Phosphorus normal at 4.0 - No need for treatment  6. ID: Concern for viral/flu illness vs. possibly cellulitis: currently on cefazolin; RVP and influenza panel negative.    7. CHF: Diastolic heart failure,  ECHO - EF [id number], grade 2 diastolic dysfunction; mild mitral regurgitation; currently receiving diuresis. Cardiology following.   8. Deconditioning: PT has recommended HH PT.    Olene Floss, MD [place], PGY-2 03/11/2016, 8:10 AM    I have see/examined patient and concur with the findings in the above note with renal recommendations and interventions highlighted.  Improved volume status, stable renal function past couple days - creatinine 3.28 so some worsening overall in the setting of fluid overload, then diuresis, but "lungs trump kidneys" and breathing much better (still quite a bit of volume on board). Progression of underlying kidney disease + nephrotic syndrome driving fluid retention. Could be discharged in next day or so on po lasix, and has 11/15 f/u already scheduled with Dr. Florene Glen. Arranging for Aranesp and vascular access planning can continue as outpatient.   Jamal Maes, MD [facility] [phone] Pager 03/11/2016, 12:05 PM

## 2016-03-12 LAB — RENAL FUNCTION PANEL
Albumin: 2.5 g/dL — ABNORMAL LOW (ref 3.5–5.0)
Anion gap: 9 (ref 5–15)
BUN: 52 mg/dL — ABNORMAL HIGH (ref 6–20)
CALCIUM: 8.8 mg/dL — AB (ref 8.9–10.3)
CO2: 29 mmol/L (ref 22–32)
Chloride: 98 mmol/L — ABNORMAL LOW (ref 101–111)
Creatinine, Ser: 3.35 mg/dL — ABNORMAL HIGH (ref 0.61–1.24)
GFR calc Af Amer: 19 mL/min — ABNORMAL LOW (ref >60)
GFR calc non Af Amer: 16 mL/min — ABNORMAL LOW (ref >60)
GLUCOSE: 85 mg/dL (ref 65–99)
PHOSPHORUS: 3.9 mg/dL (ref 2.5–4.6)
Potassium: 4.3 mmol/L (ref 3.5–5.1)
SODIUM: 136 mmol/L (ref 135–145)

## 2016-03-12 LAB — CULTURE, BLOOD (ROUTINE X 2)
CULTURE: NO GROWTH
CULTURE: NO GROWTH

## 2016-03-12 LAB — PROCALCITONIN: PROCALCITONIN: 0.27 ng/mL

## 2016-03-12 MED ORDER — CARVEDILOL 3.125 MG PO TABS: 3.1250 mg | ORAL_TABLET | Freq: Two times a day (BID) | ORAL | 1 refills | Status: DC

## 2016-03-12 MED ORDER — FUROSEMIDE 80 MG PO TABS
80.0000 mg | ORAL_TABLET | Freq: Two times a day (BID) | ORAL | Status: DC
  Filled 2016-03-12: qty 1

## 2016-03-12 MED ORDER — ASPIRIN 81 MG PO TBEC: 81.0000 mg | DELAYED_RELEASE_TABLET | Freq: Every day | ORAL | Status: AC

## 2016-03-12 MED ORDER — SITAGLIPTIN PHOSPHATE 25 MG PO TABS: 25.0000 mg | ORAL_TABLET | Freq: Every day | ORAL | 1 refills | Status: DC

## 2016-03-12 MED ORDER — CEPHALEXIN 500 MG PO CAPS: 500.0000 mg | ORAL_CAPSULE | Freq: Two times a day (BID) | ORAL | 0 refills | Status: DC

## 2016-03-12 MED ORDER — CARVEDILOL 3.125 MG PO TABS
3.1250 mg | ORAL_TABLET | Freq: Two times a day (BID) | ORAL | Status: DC
  Administered 2016-03-12: 3.125 mg via ORAL
  Filled 2016-03-12 (×2): qty 1

## 2016-03-12 MED ORDER — FUROSEMIDE 80 MG PO TABS: 160.0000 mg | ORAL_TABLET | Freq: Two times a day (BID) | ORAL | 0 refills | Status: DC

## 2016-03-12 NOTE — Discharge Summary (Signed)
Physician Discharge Summary  Larry [name] [id number] DOB: 1941-04-29 DOA: 03/07/2016  PCP: Harvie Junior, MD  Admit date: 03/07/2016 Discharge date: 03/12/2016  Admitted From: Home Disposition:  Home  Recommendations for Outpatient Follow-up:  1. Follow up with PCP in 1-2 weeks 2. Please obtain BMP in one week 3. Follow up with nephrology, Dr. [name], on 03/15/16   Home Health:Yes Equipment/Devices: Rolling Walker  Discharge Condition:Stable CODE STATUS:FULL Diet recommendation: Heart Healthy    Brief/Interim Summary: 75 year old male with a history of GERD, diabetes mellitus type 2, non-Hodgkin's lymphoma, hypertension, hypogonadism with low testosterone levels presented with three-day history of fevers, chills, generalized weakness and worsening dyspnea on exertion. The patient states that he has had chronic lower extremity edema, but this has worsened in the past week. The patient states that he recently followed up with his nephrologist, Dr. Florene Glen. From that visit, the patient states that his furosemide was decreased from 40 mg twice a day to once daily for which he has been on for the past 4-5 days prior to admission. His furosemide was decreased due to worsening serum creatinine. Upon presentation, the patient was noted to have serum creatinine 2.99 with chest x-ray showing small to moderate bilateral pleural effusions. The patient was given furosemide 60 mg IV--he has received 2 doses up to this point.  Discharge Diagnoses:  Acute diastolicCHF -He has a clinically complicatedpicture -I feel that his worsening renal function and nephrotic proteinuria contributing to his fluid overload with good LV function on echo -appreciate cardiology assistance -Urine protein creatinine ratio-->10.20-->nephrotic range -Echocardiogram--EF 55-60%, grade 2 DD -Daily I's and O's--NEG 6.7 L -discharge weight 176 -switch to lasix 160 mg bid per renal recommendation  03/11/16--home with this dose  Acute on chronic renal failure--CKD stage IV - Previous Baseline creatinine 2.4-2.6 -Presenting creatinine 2.99 -appreciate renal assistance -Urine protein creatinine ratio--10.20 -will need to tolerate worse renal function for improved pulm function--now with new baseline--discharge creatinine 3.28 -AM renal panel--renal function stable on lasix 160 mg bid--follow up with Dr. Florene Glen on 03/15/16  Elevated troponin -Likely demand ischemia in the setting of worsening renal failure and decompensated heart failure -No chest pain presently -EKG personally reviewed--sinus rhythm, nonspecific T-wave change  Fever-->cellulitis of neck/carbuncles -Viral respiratory panel--neg -Source may also be cellulitis from the patient's neck--he has numerous carbuncles on his posterior neck  -Urinalysis is negative for pyuria  -finished cefazolin D#3 -switch to cephalexin 11/11--home with 3 additional days to finish 7 days of therapy -neck is improving  Hypertension -Continue amlodipine and Cardura -Anticipate improvement with diuresis -coreg 3.125 mg bid added  Diabetes mellitus type 2, controlled -Check hemoglobin A1c--5.6 -D/C CBGs -home with januvia 25 mg daily   Discharge Instructions  Discharge Instructions    Diet - low sodium heart healthy    Complete by:  As directed    Increase activity slowly    Complete by:  As directed        Medication List    TAKE these medications   allopurinol 100 MG tablet Commonly known as:  ZYLOPRIM Take 100 mg by mouth daily.   amLODipine 10 MG tablet Commonly known as:  NORVASC Take 10 mg by mouth daily.   aspirin 81 MG EC tablet Take 1 tablet (81 mg total) by mouth daily. Start taking on:  03/13/2016   carvedilol 3.125 MG tablet Commonly known as:  COREG Take 1 tablet (3.125 mg total) by mouth 2 (two) times daily with a meal.   cephALEXin 500  MG capsule Commonly known as:  KEFLEX Take 1 capsule  (500 mg total) by mouth every 12 (twelve) hours.   doxazosin 4 MG tablet Commonly known as:  CARDURA Take 8 mg by mouth 2 (two) times daily. 2 tabs twice daily   furosemide 80 MG tablet Commonly known as:  LASIX Take 2 tablets (160 mg total) by mouth 2 (two) times daily. What changed:  medication strength  how much to take  when to take this   levothyroxine 175 MCG tablet Commonly known as:  SYNTHROID, LEVOTHROID Take 175 mcg by mouth daily before breakfast. What changed:  Another medication with the same name was removed. Continue taking this medication, and follow the directions you see here.   omeprazole 20 MG capsule Commonly known as:  PRILOSEC Take 20 mg by mouth 2 (two) times daily.   sitaGLIPtin 25 MG tablet Commonly known as:  JANUVIA Take 1 tablet (25 mg total) by mouth daily. What changed:  medication strength  how much to take   triamcinolone ointment 0.1 % Commonly known as:  KENALOG Apply 1 application topically daily as needed (for irritation).       Allergies  Allergen Reactions  . Iohexol Other (See Comments)    Pt states has history of reaction in the past, can not remember what exactly happened, but was told by the dr never to have the contrast again.    Consultations:  Cardiology  nephrology   Procedures/Studies: Dg Chest 2 View  Result Date: 03/07/2016 CLINICAL DATA:  Weakness and body aches for 3 days. Low grade fever. EXAM: CHEST  2 VIEW COMPARISON:  PA and lateral chest 04/07/2015.  CT chest 06/01/2010. FINDINGS: The patient has small to moderate bilateral pleural effusions, larger on the left. Indistinctness of the pulmonary vasculature is present bilaterally with more focal airspace opacities in the lung bases, worse on the left. No pneumothorax. There is mild cardiomegaly. IMPRESSION: Findings most compatible with congestive heart failure with associated small to moderate bilateral pleural effusions. Electronically Signed   By:  Inge Rise M.D.   On: 03/07/2016 15:57        Discharge Exam: Vitals:   03/12/16 1116 03/12/16 1155  BP: (!) 151/54 (!) 142/59  Pulse: 82 84  Resp:  20  Temp:  99.5 F (37.5 C)   Vitals:   03/12/16 0518 03/12/16 0855 03/12/16 1116 03/12/16 1155  BP: 130/76 (!) 143/50 (!) 151/54 (!) 142/59  Pulse: 73  82 84  Resp: 18   20  Temp: 99.4 F (37.4 C)   99.5 F (37.5 C)  TempSrc: Oral   Oral  SpO2: 93%   92%  Weight: 80.2 kg (176 lb 14.4 oz)     Height:        General: Pt is alert, awake, not in acute distress Cardiovascular: RRR, S1/S2 +, no rubs, no gallops Respiratory: fine bibasilar crackles Abdominal: Soft, NT, ND, bowel sounds + Extremities: 1 + LE LE edema, no cyanosis   The results of significant diagnostics from this hospitalization (including imaging, microbiology, ancillary and laboratory) are listed below for reference.    Significant Diagnostic Studies: Dg Chest 2 View  Result Date: 03/07/2016 CLINICAL DATA:  Weakness and body aches for 3 days. Low grade fever. EXAM: CHEST  2 VIEW COMPARISON:  PA and lateral chest 04/07/2015.  CT chest 06/01/2010. FINDINGS: The patient has small to moderate bilateral pleural effusions, larger on the left. Indistinctness of the pulmonary vasculature is present bilaterally with more focal  airspace opacities in the lung bases, worse on the left. No pneumothorax. There is mild cardiomegaly. IMPRESSION: Findings most compatible with congestive heart failure with associated small to moderate bilateral pleural effusions. Electronically Signed   By: Inge Rise M.D.   On: 03/07/2016 15:57     Microbiology: Recent Results (from the past 240 hour(s))  Blood culture (routine x 2)     Status: None (Preliminary result)   Collection Time: 03/07/16  2:30 PM  Result Value Ref Range Status   Specimen Description BLOOD RIGHT ANTECUBITAL  Final   Special Requests BOTTLES DRAWN AEROBIC AND ANAEROBIC 5CC  Final   Culture NO GROWTH 4  DAYS  Final   Report Status PENDING  Incomplete  Blood culture (routine x 2)     Status: None (Preliminary result)   Collection Time: 03/07/16  2:39 PM  Result Value Ref Range Status   Specimen Description BLOOD RIGHT HAND  Final   Special Requests BOTTLES DRAWN AEROBIC ONLY 10CC  Final   Culture NO GROWTH 4 DAYS  Final   Report Status PENDING  Incomplete  Urine culture     Status: Abnormal   Collection Time: 03/07/16  3:25 PM  Result Value Ref Range Status   Specimen Description URINE, RANDOM  Final   Special Requests NONE  Final   Culture MULTIPLE SPECIES PRESENT, SUGGEST RECOLLECTION (A)  Final   Report Status 03/08/2016 FINAL  Final  Respiratory Panel by PCR     Status: None   Collection Time: 03/07/16  4:53 PM  Result Value Ref Range Status   Adenovirus NOT DETECTED NOT DETECTED Final   Coronavirus 229E NOT DETECTED NOT DETECTED Final   Coronavirus HKU1 NOT DETECTED NOT DETECTED Final   Coronavirus NL63 NOT DETECTED NOT DETECTED Final   Coronavirus OC43 NOT DETECTED NOT DETECTED Final   Metapneumovirus NOT DETECTED NOT DETECTED Final   Rhinovirus / Enterovirus NOT DETECTED NOT DETECTED Final   Influenza A NOT DETECTED NOT DETECTED Final   Influenza B NOT DETECTED NOT DETECTED Final   Parainfluenza Virus 1 NOT DETECTED NOT DETECTED Final   Parainfluenza Virus 2 NOT DETECTED NOT DETECTED Final   Parainfluenza Virus 3 NOT DETECTED NOT DETECTED Final   Parainfluenza Virus 4 NOT DETECTED NOT DETECTED Final   Respiratory Syncytial Virus NOT DETECTED NOT DETECTED Final   Bordetella pertussis NOT DETECTED NOT DETECTED Final   Chlamydophila pneumoniae NOT DETECTED NOT DETECTED Final   Mycoplasma pneumoniae NOT DETECTED NOT DETECTED Final     Labs: Basic Metabolic Panel:  Recent Labs Lab 03/08/16 0252 03/09/16 0358 03/10/16 0513 03/11/16 0451 03/12/16 0438  NA 135 136 136 137 136  K 4.7 4.7 4.7 4.2 4.3  CL 104 103 103 100* 98*  CO2 22 25 25 28 29   GLUCOSE 156* 83 78 85  85  BUN 42* 44* 46* 47* 52*  CREATININE 3.12* 3.28* 3.34* 3.28* 3.35*  CALCIUM 9.0 8.9 8.7* 9.0 8.8*  MG  --  1.8  --   --   --   PHOS  --  4.3 4.2 4.0 3.9   Liver Function Tests:  Recent Labs Lab 03/07/16 1436 03/09/16 0358 03/10/16 0513 03/11/16 0451 03/12/16 0438  AST 16  --   --   --   --   ALT 14*  --   --   --   --   ALKPHOS 81  --   --   --   --   BILITOT 1.1  --   --   --   --  PROT 6.3*  --   --   --   --   ALBUMIN 3.1* 2.7* 2.6* 2.5* 2.5*   No results for input(s): LIPASE, AMYLASE in the last 168 hours. No results for input(s): AMMONIA in the last 168 hours. CBC:  Recent Labs Lab 03/07/16 1436 03/10/16 0513  WBC 8.9 8.6  NEUTROABS 6.9  --   HGB 9.3* 9.5*  HCT 27.2* 27.4*  MCV 99.6 100.0  PLT 189 200   Cardiac Enzymes:  Recent Labs Lab 03/07/16 1436 03/07/16 2032 03/08/16 0252  TROPONINI 0.08* 0.13* 0.13*   BNP: Invalid input(s): POCBNP CBG:  Recent Labs Lab 03/08/16 2127 03/09/16 0601 03/09/16 1122 03/09/16 1712 03/09/16 2111  GLUCAP 115* 83 104* 144* 122*    Time coordinating discharge:  Greater than 30 minutes  Signed:  Ac Colan, DO [facility] Pager: [phone] 03/12/2016, 2:00 PM

## 2016-03-12 NOTE — Progress Notes (Signed)
[facility] Progress Note   Subjective:   Patient doing well. Feels that the tightness in his legs has improved significantly. Plans to follow up with Dr. Florene Glen   Objective:   BP 130/76 (BP Location: Right Arm)   Pulse 73   Temp 99.4 F (37.4 C) (Oral)   Resp 18   Ht 5\' 8"  (1.727 m)   Wt 176 lb 14.4 oz (80.2 kg) Comment: scale c  SpO2 93%   BMI 26.90 kg/m   Intake/Output Summary (Last 24 hours) at 03/12/16 0830 Last data filed at 03/12/16 0518  Gross per 24 hour  Intake              243 ml  Output             2151 ml  Net            -1908 ml   Weight change: -1 lb 6.4 oz (-0.635 kg)   Since admission: -5.0 L  Physical Exam: Constitutional: Older male, resting in bed, pleasant Cardiovascular: 3/6 systolic murmur loudest, RRR   Respiratory: Effort normal, CTAB this morning though decreased breath sounds over bases GI: Soft. No TTP. Bowel sounds are normal.  Musculoskeletal: 2+ pitting edema of bilateral leg to knee; 2+ edema to thigh  Skin: Skin is warm. Lipodermatoscelerosis of bilateral legs  Labs: BMET  Recent Labs Lab 03/07/16 1436 03/08/16 0252 03/09/16 0358 03/10/16 0513 03/11/16 0451 03/12/16 0438  NA 137 135 136 136 137 136  K 5.0 4.7 4.7 4.7 4.2 4.3  CL 107 104 103 103 100* 98*  CO2 23 22 25 25 28 29   GLUCOSE 157* 156* 83 78 85 85  BUN 38* 42* 44* 46* 47* 52*  CREATININE 2.99* 3.12* 3.28* 3.34* 3.28* 3.35*  CALCIUM 8.8* 9.0 8.9 8.7* 9.0 8.8*  PHOS  --   --  4.3 4.2 4.0 3.9   CBC  Recent Labs Lab 03/07/16 1436 03/10/16 0513  WBC 8.9 8.6  NEUTROABS 6.9  --   HGB 9.3* 9.5*  HCT 27.2* 27.4*  MCV 99.6 100.0  PLT 189 200    Medications:    . allopurinol  100 mg Oral Daily  . amLODipine  10 mg Oral Daily  . aspirin EC  81 mg Oral Daily  . cephALEXin  500 mg Oral Q12H  . doxazosin  8 mg Oral BID  . feeding supplement (NEPRO CARB STEADY)  237 mL Oral BID BM  . furosemide  160 mg Oral BID  . heparin  5,000 Units  Subcutaneous Q8H  . levothyroxine  175 mcg Oral QAC breakfast  . linagliptin  5 mg Oral Daily  . pantoprazole  40 mg Oral Daily  . sodium chloride flush  3 mL Intravenous Q12H   Background: Last seen by Dr. Florene Glen on 10/15/2015 with SCr 2.37. Later rising 2.76-> 2.91 on 10/17 and 03/02/16 respectively. Admitted with CHF exacerbation, receiving diuresis. We are asked to see because of tenuous/worsening renal function with creatinine up to 3.12 11/8. Home lasix had been decreased earlier this month for worsening creatinine, as patient had not expressed progressive SOB and swelling, though pt says these symptoms started before changing medication dose. Kidneys responding adequately to lasix.    Assessment/ Plan:    1. 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 Fuid overload and worsening creatinine likely due to nephrotic syndrome. Baseline serum creatinine previously 2.5, however has been worsening over past  several months (6/16  SCr 2.37, 10/19 2.76, 11/2 2.91). Most recent  SCr 2.99 > 3.12 > 3.28 > 3.34 > 3.28>3.35; electrolytes have remained stable.  - Remains volume overload - UOP 2.15 L; weight improving 204 lbs> 202lbs > 197 lbs > 189 lbs > 178 lbs>176 lbs. Continue PO lasix 160 BID this a.m.  - Has 03/15/16 followup scheduled with Dr. Florene Glen, so has timely outpt follow-up. Watched dialysis videos  - Daily renal function panel  - Progression of underlying kidney disease + nephrotic syndrome = what is driving fluid retention.  2. Anemia: Hgb 9.5. Iron 50 and TIBC 225 11/8. Feraheme x 1 on 11/10. Aranesp 60 mcg weekly. We will need to set up dosing in short stay when he sees Dr. Florene Glen in the clinic.   3. HTN: hypertensive since admission, SBPs 150s; currently on amlodipine 10 mg, doxazosin 8 mg BID; continue diuresis.    4. T2DM: A1C 6.9 (2013) sliding scale + tradjenta  5 mg daily   5. Metabolic Bone Disease: PTH slightly elevated  at 132. Phosphorus normal at 4.0 - No intervention needed.  6. ID: Concern for viral/flu illness vs. possibly cellulitis: currently on cefazolin; RVP and influenza panel negative.    7. CHF: Diastolic heart failure,  ECHO - EF 123456, grade 2 diastolic dysfunction; mild mitral regurgitation; currently receiving diuresis. Cardiology following.   8. Deconditioning: PT has recommended HHPT.    Patient is cleared for discharge. Patient to follow up with Dr. Florene Glen form nephrology appointment on November 15th.   Kerrin Mo, MD [place], PGY-2 03/12/2016, 8:30 AM    I have seen and examined this patient and agree with plan and assessment in the above note with renal recommendationsand interventions highlighted. From a renal standpoint I feel pt is cleared for discharge. His creatinine is actually stable and I would send him out on the current dose of 160 BID of lasix (rather than reducing to 80 BID as cardiology suggests) since he has followup with Dr. Florene Glen already scheduled 3 days from now and the dose can be adjusted then if need be. Outpatient Aranesp can be arranged when he sees Dr. Florene Glen in the clinic on the 15th  Karimah Winquist B,MD 03/12/2016 12:59 PM

## 2016-03-12 NOTE — Progress Notes (Signed)
Patient Name: Larry [name] Date of Encounter: 03/12/2016  Primary Cardiologist: Dr. 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   Inpatient Medications    Scheduled Meds: . allopurinol  100 mg Oral Daily  . amLODipine  10 mg Oral Daily  . aspirin EC  81 mg Oral Daily  . cephALEXin  500 mg Oral Q12H  . doxazosin  8 mg Oral BID  . feeding supplement (NEPRO CARB STEADY)  237 mL Oral BID BM  . furosemide  160 mg Oral BID  . heparin  5,000 Units Subcutaneous Q8H  . levothyroxine  175 mcg Oral QAC breakfast  . linagliptin  5 mg Oral Daily  . pantoprazole  40 mg Oral Daily  . sodium chloride flush  3 mL Intravenous Q12H   Continuous Infusions:  PRN Meds: sodium chloride, acetaminophen, albuterol, hydrALAZINE, ondansetron (ZOFRAN) IV, sodium chloride flush   Vital Signs    Vitals:   03/11/16 1300 03/11/16 1941 03/12/16 0518 03/12/16 0855  BP: (!) 108/57 (!) 156/60 130/76 (!) 143/50  Pulse: 72 76 73   Resp: 18 20 18    Temp: 98.3 F (36.8 C) 99.2 F (37.3 C) 99.4 F (37.4 C)   TempSrc: Oral Oral Oral   SpO2: 94% 94% 93%   Weight:   176 lb 14.4 oz (80.2 kg)   Height:        Intake/Output Summary (Last 24 hours) at 03/12/16 1019 Last data filed at 03/12/16 0518  Gross per 24 hour  Intake              123 ml  Output             2151 ml  Net            -2028 ml   Filed Weights   03/10/16 0600 03/11/16 0516 03/12/16 0518  Weight: 189 lb 12.8 oz (86.1 kg) 178 lb 4.8 oz (80.9 kg) 176 lb 14.4 oz (80.2 kg)    Physical Exam    General: Well developed, well nourished, male in no acute distress Lungs: Resp regular and unlabored, distant breath sounds, clear to auscultation Heart: RRR, 3/6 late peaking systolic murmur heard loudest at the RUS border   Neck: No lymphadenopathy, JVD present. Abdomen: Abdomen soft and non-tender without masses or hernias noted. Extremities: knee high stockings on, edema is improving, . Radials 2+ and equal bilaterally Neuro: Alert and oriented X 3. No focal deficits noted. Psych:  Good affect, responds appropriately   Labs    CBC  Recent Labs  03/10/16 0513  WBC 8.6  HGB 9.5*  HCT 27.4*  MCV 100.0  PLT [id number]   Basic Metabolic Panel  Recent Labs  03/11/16 0451 03/12/16 0438  NA 137 136  K 4.2 4.3  CL 100* 98*  CO2 28 29  GLUCOSE 85 85  BUN  47* 52*  CREATININE 3.28* 3.35*  CALCIUM 9.0 8.8*  PHOS 4.0 3.9   Liver Function Tests  Recent Labs  03/11/16 0451 03/12/16 0438  ALBUMIN 2.5* 2.5*   No results for input(s): TSH, T4TOTAL, T3FREE, THYROIDAB in the last 72 hours.  Invalid input(s): FREET3  Telemetry    Sinus rhythm, no Vtach - Personally Reviewed  ECG    Sinus rhythm, no acute ischemic changes - Personally Reviewed  Radiology    No results found.  Cardiac Studies   TTE 03/09/16: - Left ventricle: The cavity size was normal. Systolic function was   normal. The estimated ejection fraction was in the range of 55%   to 60%. Wall motion was normal; there were no regional wall   motion abnormalities. Features are consistent with a pseudonormal   left ventricular filling pattern, with concomitant abnormal   relaxation and increased filling pressure (grade 2 diastolic   dysfunction). Doppler parameters are consistent with high   ventricular filling pressure. - Aortic valve: Trileaflet; normal  thickness, mildly calcified   leaflets. - Mitral valve: There was mild regurgitation. - Left atrium: The atrium was mildly dilated.     Patient Profile     75 y.o. male with a history of Non-Hodgkin's Lymphoma, HTN, DM, CKD 3, hypothyroidism, and hypotestosteronism who presented with 3-4 days of malaise, generalized weakness, SOB, fevers, chills, and increase in his chronic lower extremity swelling.  Assessment & Plan    Diastolic CHF: Suspected chronic CHF with no prior Echo on file. Weight down 22 lbs .  TTE this admission shows EF 55-60%, pseudonormal LV filling pattern, grade 2 diastolic dysfunction. Continues to diurese well. - He has residual LE edema, but significantly improved, I would decrease lasix to 80 mg po BID as the crea is increasing. - He could be discharged today -Continue knee high ted hose -Continue to elevate legs above the level of the heart -Monitor daily standing weights -Strict I/Os -Monitor RFP for renal function and potassium  HTN: BP slightly elevated this morning.  -Hold ACE/ARB with renal function - add carvedilol 3.125 mg po BID  Elevated Troponin: Likely demand ischemia from volume overload, troponin trend flat at 0.08 > 0.13 > 0.13.  Systolic murmur: Suspected moderate aortic stenosis with late peaking 123456 systolic murmur heard throughout, loudest at the RUS border with radiation to the neck. -TTE without evidence of aortic valve stenosis  Hypothyroidism: On Synthroid 175 mcg -TSH 3.047  Acute on Chronic Kidney Disease: Follows with Dr. Florene Glen. Creatinine essentially flat from 3.28 >> 3.34. -Diuresis, daily weights, I/Os, RFP as above -Nephrology following  Signed, Ena Dawley, MD  03/12/2016, 10:19 AM

## 2016-03-12 NOTE — Care Management Note (Signed)
Case Management Note  Patient Details  Name: Larry [name] MRN: [id number] Date of Birth: July 01, 1940  Subjective/Objective:                  . Fatigue  . Leg Swelling     Action/Plan: CM, Mariane Masters, spoke to patient who [facility]. Cm called Jermaine with [facility] for [facility] PT setup. CM called Reggie with [facility] to request RW. No additional needs identified and patient states that he is ready for discharge.   Expected Discharge Date:  03/12/16              Expected Discharge Plan:  [place]  In-House Referral:     Discharge planning Services  CM Consult  Post Acute Care Choice:  Home Health Choice offered to:  Patient  DME Arranged:  Walker rolling DME Agency:  [place]:  PT [facility] Agency:  [place]  Status of Service:  Completed, signed off  If discussed at [place] of Stay Meetings, dates discussed:    Additional Comments:  Guido Sander, RN 03/12/2016, 3:57 PM

## 2016-03-12 NOTE — Progress Notes (Signed)
Pt dsichraged home IV removed, Discharge instructions given verbalized understanding refused rolling walker.

## 2016-03-13 DIAGNOSIS — Z9081 Acquired absence of spleen: Secondary | ICD-10-CM | POA: Diagnosis not present

## 2016-03-13 DIAGNOSIS — L03221 Cellulitis of neck: Secondary | ICD-10-CM | POA: Diagnosis not present

## 2016-03-13 DIAGNOSIS — L89322 Pressure ulcer of left buttock, stage 2: Secondary | ICD-10-CM | POA: Diagnosis not present

## 2016-03-13 DIAGNOSIS — Z7984 Long term (current) use of oral hypoglycemic drugs: Secondary | ICD-10-CM | POA: Diagnosis not present

## 2016-03-13 DIAGNOSIS — N184 Chronic kidney disease, stage 4 (severe): Secondary | ICD-10-CM | POA: Diagnosis not present

## 2016-03-13 DIAGNOSIS — K219 Gastro-esophageal reflux disease without esophagitis: Secondary | ICD-10-CM | POA: Diagnosis not present

## 2016-03-13 DIAGNOSIS — E1122 Type 2 diabetes mellitus with diabetic chronic kidney disease: Secondary | ICD-10-CM | POA: Diagnosis not present

## 2016-03-13 DIAGNOSIS — I130 Hypertensive heart and chronic kidney disease with heart failure and stage 1 through stage 4 chronic kidney disease, or unspecified chronic kidney disease: Secondary | ICD-10-CM | POA: Diagnosis not present

## 2016-03-13 DIAGNOSIS — C8590 Non-Hodgkin lymphoma, unspecified, unspecified site: Secondary | ICD-10-CM | POA: Diagnosis not present

## 2016-03-13 DIAGNOSIS — I5031 Acute diastolic (congestive) heart failure: Secondary | ICD-10-CM | POA: Diagnosis not present

## 2016-03-14 ENCOUNTER — Ambulatory Visit: Payer: Medicare Other

## 2016-03-14 DIAGNOSIS — L03221 Cellulitis of neck: Secondary | ICD-10-CM | POA: Diagnosis not present

## 2016-03-14 DIAGNOSIS — E1122 Type 2 diabetes mellitus with diabetic chronic kidney disease: Secondary | ICD-10-CM | POA: Diagnosis not present

## 2016-03-14 DIAGNOSIS — I5031 Acute diastolic (congestive) heart failure: Secondary | ICD-10-CM | POA: Diagnosis not present

## 2016-03-14 DIAGNOSIS — L89322 Pressure ulcer of left buttock, stage 2: Secondary | ICD-10-CM | POA: Diagnosis not present

## 2016-03-14 DIAGNOSIS — I130 Hypertensive heart and chronic kidney disease with heart failure and stage 1 through stage 4 chronic kidney disease, or unspecified chronic kidney disease: Secondary | ICD-10-CM | POA: Diagnosis not present

## 2016-03-14 DIAGNOSIS — N184 Chronic kidney disease, stage 4 (severe): Secondary | ICD-10-CM | POA: Diagnosis not present

## 2016-03-15 ENCOUNTER — Other Ambulatory Visit: Payer: Self-pay

## 2016-03-15 DIAGNOSIS — E213 Hyperparathyroidism, unspecified: Secondary | ICD-10-CM | POA: Diagnosis not present

## 2016-03-15 DIAGNOSIS — R600 Localized edema: Secondary | ICD-10-CM | POA: Diagnosis not present

## 2016-03-15 DIAGNOSIS — E1122 Type 2 diabetes mellitus with diabetic chronic kidney disease: Secondary | ICD-10-CM | POA: Diagnosis not present

## 2016-03-15 DIAGNOSIS — I130 Hypertensive heart and chronic kidney disease with heart failure and stage 1 through stage 4 chronic kidney disease, or unspecified chronic kidney disease: Secondary | ICD-10-CM | POA: Diagnosis not present

## 2016-03-15 DIAGNOSIS — L03221 Cellulitis of neck: Secondary | ICD-10-CM | POA: Diagnosis not present

## 2016-03-15 DIAGNOSIS — I10 Essential (primary) hypertension: Secondary | ICD-10-CM | POA: Diagnosis not present

## 2016-03-15 DIAGNOSIS — N184 Chronic kidney disease, stage 4 (severe): Secondary | ICD-10-CM | POA: Diagnosis not present

## 2016-03-15 DIAGNOSIS — L89322 Pressure ulcer of left buttock, stage 2: Secondary | ICD-10-CM | POA: Diagnosis not present

## 2016-03-15 DIAGNOSIS — I5031 Acute diastolic (congestive) heart failure: Secondary | ICD-10-CM | POA: Diagnosis not present

## 2016-03-15 NOTE — Patient Outreach (Signed)
[facility]) Care Management  03/15/2016  Larry [name] 04-Jul-1940 [id number]  REFERRAL DATE:  03/15/16 REFERRAL SOURCE: [name] REFERRAL REASON:  EMMI heart failure red alert: "scheduled follow up appointment: NO"  Telephone call to patient regarding EMMI heart failure red alert. Unable to reach patient. HIPAA compliant voice message left with call back phone number.  PLAN; RNCM will attempt 2nd telephone outreach to patient within 1 week.   Quinn Plowman RN,BSN,CCM [facility] Telephonic  [phone]

## 2016-03-16 ENCOUNTER — Other Ambulatory Visit: Payer: Self-pay

## 2016-03-16 DIAGNOSIS — L03221 Cellulitis of neck: Secondary | ICD-10-CM | POA: Diagnosis not present

## 2016-03-16 DIAGNOSIS — I130 Hypertensive heart and chronic kidney disease with heart failure and stage 1 through stage 4 chronic kidney disease, or unspecified chronic kidney disease: Secondary | ICD-10-CM | POA: Diagnosis not present

## 2016-03-16 DIAGNOSIS — E1122 Type 2 diabetes mellitus with diabetic chronic kidney disease: Secondary | ICD-10-CM | POA: Diagnosis not present

## 2016-03-16 DIAGNOSIS — I5031 Acute diastolic (congestive) heart failure: Secondary | ICD-10-CM | POA: Diagnosis not present

## 2016-03-16 DIAGNOSIS — N184 Chronic kidney disease, stage 4 (severe): Secondary | ICD-10-CM | POA: Diagnosis not present

## 2016-03-16 DIAGNOSIS — L89322 Pressure ulcer of left buttock, stage 2: Secondary | ICD-10-CM | POA: Diagnosis not present

## 2016-03-16 NOTE — Patient Outreach (Addendum)
[facility] Care Management  03/16/2016  Larry [name] 10-25-1940 [id number]  REFERRAL DATE; 03/15/16  REFERRAL SOURCE: [name] REFERRAL REASON:  EMMI heart failure RED alert:   "Scheduled follow up appointment: NO" CONSENT: self/ patient  PROVIDERS: Dr.  Dr. Florene Glen - nephrology  SOCIAL: Patient lives with spouse, Larry [name] Received physical [facility] services with [facility] Transportation to appointments provided by patients spouse, Larry [name]  SUBJECTIVE: Telephone call to patient regarding EMMI heart failure red alert referral. Patient states, " I am doing much better since I have gotten out of the hospital." Patient states he has an appointment scheduled with his new primary MD on tomorrow, 03/17/16.  Patient reports he saw his nephrologist on 03/15/16.  Patient denies any treatment changes. Patient states next follow up with nephrologist in 04/13/16.   Patient states he had a lot of fluid taken off of him while in the hospital, approximately 8-9 lbs.  Patient states he has been a little weak since he has been home but he is continuing to work with [facility] physical therapy. Patient states he uses a walker for ambulation assistance.  Patient reports weight for today is 166 lbs.  Patient states he has all of his medications and is able to afford them.Patient states he takes his medications as prescribed.  Patient states he has transportation to his appointments.  Patient reports he is now starting to check his weight daily. States he has never had to do this but has been asked to by the doctor.  RNCM discussed and offered [facility] care management program to patient. Patient agreed with having case manager follow for heart failure education/ management.  RNCM advised patient to continue to take his medications as prescribed, contact doctor for increased swelling/ shortness of breath and overall heart failure symptoms.  Advised  patient to continue to weigh daily and record.  Advised patient to keep follow up appointments  ASSESSMENT; Pleasant 75 year old male with EMMI heart failure red notification.  Patient verbally agreed to [facility] care management follow up.  Will benefit from case manager referral.  PLAN; RNCM will refer patient to case manager.   Quinn Plowman RN,BSN,CCM [facility] Telephonic  [phone]

## 2016-03-17 DIAGNOSIS — E1121 Type 2 diabetes mellitus with diabetic nephropathy: Secondary | ICD-10-CM | POA: Diagnosis not present

## 2016-03-17 DIAGNOSIS — R54 Age-related physical debility: Secondary | ICD-10-CM | POA: Diagnosis not present

## 2016-03-17 DIAGNOSIS — M6281 Muscle weakness (generalized): Secondary | ICD-10-CM | POA: Diagnosis not present

## 2016-03-19 DIAGNOSIS — E1122 Type 2 diabetes mellitus with diabetic chronic kidney disease: Secondary | ICD-10-CM | POA: Diagnosis not present

## 2016-03-19 DIAGNOSIS — I5031 Acute diastolic (congestive) heart failure: Secondary | ICD-10-CM | POA: Diagnosis not present

## 2016-03-19 DIAGNOSIS — I130 Hypertensive heart and chronic kidney disease with heart failure and stage 1 through stage 4 chronic kidney disease, or unspecified chronic kidney disease: Secondary | ICD-10-CM | POA: Diagnosis not present

## 2016-03-19 DIAGNOSIS — L03221 Cellulitis of neck: Secondary | ICD-10-CM | POA: Diagnosis not present

## 2016-03-19 DIAGNOSIS — L89322 Pressure ulcer of left buttock, stage 2: Secondary | ICD-10-CM | POA: Diagnosis not present

## 2016-03-19 DIAGNOSIS — N184 Chronic kidney disease, stage 4 (severe): Secondary | ICD-10-CM | POA: Diagnosis not present

## 2016-03-20 DIAGNOSIS — N184 Chronic kidney disease, stage 4 (severe): Secondary | ICD-10-CM | POA: Diagnosis not present

## 2016-03-20 DIAGNOSIS — L89322 Pressure ulcer of left buttock, stage 2: Secondary | ICD-10-CM | POA: Diagnosis not present

## 2016-03-20 DIAGNOSIS — I130 Hypertensive heart and chronic kidney disease with heart failure and stage 1 through stage 4 chronic kidney disease, or unspecified chronic kidney disease: Secondary | ICD-10-CM | POA: Diagnosis not present

## 2016-03-20 DIAGNOSIS — E1122 Type 2 diabetes mellitus with diabetic chronic kidney disease: Secondary | ICD-10-CM | POA: Diagnosis not present

## 2016-03-20 DIAGNOSIS — I5031 Acute diastolic (congestive) heart failure: Secondary | ICD-10-CM | POA: Diagnosis not present

## 2016-03-20 DIAGNOSIS — L03221 Cellulitis of neck: Secondary | ICD-10-CM | POA: Diagnosis not present

## 2016-03-21 ENCOUNTER — Other Ambulatory Visit (HOSPITAL_BASED_OUTPATIENT_CLINIC_OR_DEPARTMENT_OTHER): Payer: Medicare Other

## 2016-03-21 ENCOUNTER — Ambulatory Visit (HOSPITAL_BASED_OUTPATIENT_CLINIC_OR_DEPARTMENT_OTHER): Payer: Medicare Other | Admitting: Hematology & Oncology

## 2016-03-21 ENCOUNTER — Other Ambulatory Visit: Payer: Self-pay

## 2016-03-21 ENCOUNTER — Ambulatory Visit (HOSPITAL_BASED_OUTPATIENT_CLINIC_OR_DEPARTMENT_OTHER): Payer: Medicare Other

## 2016-03-21 VITALS — BP 128/61 | HR 55 | Temp 97.8°F | Resp 16 | Wt 157.0 lb

## 2016-03-21 DIAGNOSIS — C8307 Small cell B-cell lymphoma, spleen: Secondary | ICD-10-CM | POA: Diagnosis not present

## 2016-03-21 DIAGNOSIS — E349 Endocrine disorder, unspecified: Secondary | ICD-10-CM

## 2016-03-21 DIAGNOSIS — N183 Chronic kidney disease, stage 3 unspecified: Secondary | ICD-10-CM

## 2016-03-21 DIAGNOSIS — E291 Testicular hypofunction: Secondary | ICD-10-CM

## 2016-03-21 DIAGNOSIS — N182 Chronic kidney disease, stage 2 (mild): Secondary | ICD-10-CM

## 2016-03-21 DIAGNOSIS — D508 Other iron deficiency anemias: Secondary | ICD-10-CM

## 2016-03-21 DIAGNOSIS — D631 Anemia in chronic kidney disease: Secondary | ICD-10-CM

## 2016-03-21 DIAGNOSIS — N189 Chronic kidney disease, unspecified: Secondary | ICD-10-CM | POA: Diagnosis not present

## 2016-03-21 DIAGNOSIS — D509 Iron deficiency anemia, unspecified: Secondary | ICD-10-CM

## 2016-03-21 LAB — CMP (CANCER CENTER ONLY)
ALBUMIN: 3.2 g/dL — AB (ref 3.3–5.5)
ALT(SGPT): 10 U/L (ref 10–47)
AST: 25 U/L (ref 11–38)
Alkaline Phosphatase: 89 U/L — ABNORMAL HIGH (ref 26–84)
BUN: 67 mg/dL — AB (ref 7–22)
CHLORIDE: 96 meq/L — AB (ref 98–108)
CO2: 31 mEq/L (ref 18–33)
Calcium: 9.2 mg/dL (ref 8.0–10.3)
Creat: 3 mg/dl — ABNORMAL HIGH (ref 0.6–1.2)
Glucose, Bld: 209 mg/dL — ABNORMAL HIGH (ref 73–118)
POTASSIUM: 4 meq/L (ref 3.3–4.7)
Sodium: 141 mEq/L (ref 128–145)
TOTAL PROTEIN: 7.5 g/dL (ref 6.4–8.1)
Total Bilirubin: 0.6 mg/dl (ref 0.20–1.60)

## 2016-03-21 LAB — CHCC SATELLITE - SMEAR

## 2016-03-21 LAB — IRON AND TIBC
%SAT: 41 % (ref 20–55)
Iron: 84 ug/dL (ref 42–163)
TIBC: 205 ug/dL (ref 202–409)
UIBC: 121 ug/dL (ref 117–376)

## 2016-03-21 LAB — CBC WITH DIFFERENTIAL (CANCER CENTER ONLY)
BASO#: 0.1 10*3/uL (ref 0.0–0.2)
BASO%: 1 % (ref 0.0–2.0)
EOS ABS: 0.4 10*3/uL (ref 0.0–0.5)
EOS%: 5.8 % (ref 0.0–7.0)
HCT: 35.8 % — ABNORMAL LOW (ref 38.7–49.9)
HGB: 12.3 g/dL — ABNORMAL LOW (ref 13.0–17.1)
LYMPH#: 1.1 10*3/uL (ref 0.9–3.3)
LYMPH%: 15.8 % (ref 14.0–48.0)
MCH: 35.1 pg — AB (ref 28.0–33.4)
MCHC: 34.4 g/dL (ref 32.0–35.9)
MCV: 102 fL — AB (ref 82–98)
MONO#: 1 10*3/uL — AB (ref 0.1–0.9)
MONO%: 14.1 % — ABNORMAL HIGH (ref 0.0–13.0)
NEUT#: 4.3 10*3/uL (ref 1.5–6.5)
NEUT%: 63.3 % (ref 40.0–80.0)
PLATELETS: 391 10*3/uL (ref 145–400)
RBC: 3.5 10*6/uL — AB (ref 4.20–5.70)
RDW: 14.2 % (ref 11.1–15.7)
WBC: 6.8 10*3/uL (ref 4.0–10.0)

## 2016-03-21 LAB — FERRITIN: FERRITIN: 559 ng/mL — AB (ref 22–316)

## 2016-03-21 MED ORDER — TESTOSTERONE CYPIONATE 200 MG/ML IM SOLN
INTRAMUSCULAR | Status: AC
Start: 2016-03-21 — End: 2016-03-21
  Filled 2016-03-21: qty 2

## 2016-03-21 MED ORDER — TESTOSTERONE CYPIONATE 200 MG/ML IM SOLN
400.0000 mg | INTRAMUSCULAR | Status: DC
  Administered 2016-03-21: 400 mg via INTRAMUSCULAR

## 2016-03-21 NOTE — Patient Outreach (Signed)
[facility]) Care Management  03/21/2016  Larry [name] [birth year] [id number]   SUBJECTIVE; Telephone call to patient regarding [name] follow up. HIPAA verified with patient. Patient states he is doing ok.  Patient states he is just leaving a doctors appointment and requested call back.  PLAN:  RNCM will attempt second telephone call to patient within 1 week.  Quinn Plowman RN,BSN,CCM [facility] Telephonic  [phone]

## 2016-03-21 NOTE — Progress Notes (Signed)
Hematology and Oncology Follow Up Visit  Larry [name] [id number] 04-22-1941 75 y.o. 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  2. Hypotestosteronemia.  3. Chronic renal insufficiency.  Current Therapy:   Testosterone 400 mg IM once a month    Interim History: Larry [name] is here today for a follow-up. Unfortunately, he was admitted to the hospital about a week or so ago. He had weakness, dyspnea on exertion, worsening leg swelling. He is found to have worsening renal function. He had echocardiogram which showed ejection fraction of 55-60%. He was felt to have diastolic CHF.  His creatinine was 3. He was diuresed aggressively. When he was admitted, I think he weighed about 195. Upon discharge she was down to 176 area and he is on a high dose of Lasix.   He does have hypertension. He does have diabetes.  He was seen by multiple specialists.  He is using a rolling walker right now.  He has a little bit of a cough. He's had no bleeding. He's had no abdominal pain. He's had no leg swelling. He is feeling okay. He has had no specific complaints. There's been no change in his medications. He does have quite a bit of leg swelling. Has chronic renal insufficiency. He sees the nephrologist every 6 months.  His last time we saw him back in July, his testosterone was 208. As such, the supplemental testosterone that we give him definitely makes a difference.  His diabetes is doing fairly well.  He has mild anemia. When he is hospitalized, his iron saturation was only 22%. I think he did get a dose of iron.    He's had no bleeding. There's been no change in bowel or bladder habits.  He has had no nausea or vomiting.  Medications:    Medication List       Accurate as of 03/21/16  2:10 PM. Always use your most recent med list.          allopurinol 100 MG tablet Commonly known as:  ZYLOPRIM Take 100 mg by mouth daily.     amLODipine 10 MG tablet Commonly known as:  NORVASC Take 10 mg by mouth daily.   aspirin 81 MG EC tablet Take 1 tablet (81 mg total) by mouth daily.   carvedilol 3.125 MG tablet Commonly known as:  COREG Take 1 tablet (3.125 mg total) by mouth 2 (two) times daily with a meal.   doxazosin 4 MG tablet Commonly known as:  CARDURA Take 8 mg by mouth 2 (two) times daily. 2 tabs twice daily   furosemide 40 MG tablet Commonly known as:  LASIX Take 160 mg by mouth 2 (two) times daily.   furosemide 80 MG tablet Commonly known as:  LASIX Take 2 tablets (160 mg total) by mouth 2 (two) times daily.   levothyroxine 175 MCG tablet Commonly known as:  SYNTHROID, LEVOTHROID Take 175 mcg by mouth daily before breakfast.   omeprazole 20 MG capsule Commonly known as:  PRILOSEC Take 20 mg by mouth 2 (two) times daily.   JANUVIA 100 MG tablet Generic drug:  sitaGLIPtin Take 0.25 Doses by mouth 1 day or 1 dose.   sitaGLIPtin 25 MG tablet Commonly known as:  JANUVIA Take 1 tablet (25 mg total) by mouth daily.   triamcinolone ointment 0.1 % Commonly known as:  KENALOG Apply 1 application topically daily as needed (for irritation).       Allergies:  Allergies  Allergen Reactions  . Iohexol Other (See Comments)    Pt states has history of reaction in the past, can not remember what exactly happened, but was told by the dr never to have the contrast again.    Past Medical History, Surgical history, Social history, and Family History were reviewed and updated.  Review of Systems: All other 10 point review of systems is negative.   Physical Exam:  weight is 157 lb (71.2 kg). His oral temperature is 97.8 F (36.6 C). His blood pressure is 128/61 and his pulse is 55 (abnormal). His respiration is 16.   Wt Readings from Last 3 Encounters:  03/21/16 157 lb (71.2 kg)  03/12/16 176 lb 14.4 oz (80.2 kg)  02/15/16 198 lb 3.2 oz (89.9 kg)    Well-developed and well-nourished white  male in no obvious distress. Head and neck exam shows no ocular or oral lesions. He has no scleral icterus. There is no palpable cervical or supraclavicular lymph nodes. Lungs are clear bilaterally. Cardiac exam regular rate and rhythm with no with a 2/6 systolic ejection murmur. Abdomen is soft. Has a splint to be scar. Has a laparotomy scar in the midline. A fluid wave is noted. There is no guarding or rebound tenderness. There is no palpable hepatomegaly. Extremities shows 3+ chronic edema in his lower legs. Skin exam shows some actinic keratoses. Neurological exam shows no focal neurological deficits.  Lab Results  Component Value Date   WBC 6.8 03/21/2016   HGB 12.3 (L) 03/21/2016   HCT 35.8 (L) 03/21/2016   MCV 102 (H) 03/21/2016   PLT 391 03/21/2016   Lab Results  Component Value Date   FERRITIN 559 (H) 03/21/2016   IRON 84 03/21/2016   TIBC 205 03/21/2016   UIBC 121 03/21/2016   IRONPCTSAT 41 03/21/2016   Lab Results  Component Value Date   RETICCTPCT 0.9 03/29/2011   RBC 3.50 (L) 03/21/2016   RETICCTABS 31.8 03/29/2011   No results found for: KPAFRELGTCHN, LAMBDASER, KAPLAMBRATIO No results found for: Kandis Cocking, [facility] Lab Results  Component Value Date   TOTALPROTELP 6.7 07/14/2011   ALBUMINELP 56.6 07/14/2011   A1GS 5.7 (H) 07/14/2011   A2GS 12.7 (H) 07/14/2011   BETS 4.9 07/14/2011   BETA2SER 3.8 07/14/2011   GAMS 16.3 07/14/2011   MSPIKE NOT DET 07/14/2011   SPEI * 07/14/2011     Chemistry      Component Value Date/Time   NA 141 03/21/2016 0943   NA 142 02/15/2016 0953   K 4.0 03/21/2016 0943   K 4.4 02/15/2016 0953   CL 96 (L) 03/21/2016 0943   CO2 31 03/21/2016 0943   CO2 23 02/15/2016 0953   BUN 67 (H) 03/21/2016 0943   BUN 47.4 (H) 02/15/2016 0953   CREATININE 3.0 (H) 03/21/2016 0943   CREATININE 2.6 (H) 02/15/2016 0953      Component Value Date/Time   CALCIUM 9.2 03/21/2016 0943   CALCIUM 9.1 02/15/2016 0953   ALKPHOS 89 (H) 03/21/2016  0943   ALKPHOS 99 02/15/2016 0953   AST 25 03/21/2016 0943   AST 16 02/15/2016 0953   ALT 10 03/21/2016 0943   ALT 7 02/15/2016 0953   BILITOT 0.60 03/21/2016 0943   BILITOT 0.93 02/15/2016 0953     Impression and Plan: Larry [name] is a very pleasant 75 yo male with hypotestosteronemia. He also has a history of splenic marginal zone lymphoma. He had a splenectomy in 2009 and completed 1 cycle of Rituxan in  April 2010. So far, there has been no evidence of recurrence.   For right now, his cardiac status is probably the determinant of his prognosis. Hopefully, he will continue to improve with this.  I think we had to follow him somewhat closely. I will plan to see him back in another month.  We will give him his testosterone today.  Volanda Napoleon, MD 11/21/20172:10 PM

## 2016-03-21 NOTE — Patient Instructions (Signed)
Testosterone injection What is this medicine? TESTOSTERONE (tes TOS ter one) is the main male hormone. It supports normal male development such as muscle growth, facial hair, and deep voice. It is used in males to treat low testosterone levels. This medicine may be used for other purposes; ask your health care provider or pharmacist if you have questions. COMMON BRAND NAME(S): Andro-L.A., Aveed, Delatestryl, Depo-Testosterone, Virilon What should I tell my health care provider before I take this medicine? They need to know if you have any of these conditions: -cancer -diabetes -heart disease -kidney disease -liver disease -lung disease -prostate disease -an unusual or allergic reaction to testosterone, other medicines, foods, dyes, or preservatives -pregnant or trying to get pregnant -breast-feeding How should I use this medicine? This medicine is for injection into a muscle. It is usually given by a health care professional in a hospital or clinic setting. Contact your pediatrician regarding the use of this medicine in children. While this medicine may be prescribed for children as young as 12 years of age for selected conditions, precautions do apply. Overdosage: If you think you have taken too much of this medicine contact a poison control center or emergency room at once. NOTE: This medicine is only for you. Do not share this medicine with others. What if I miss a dose? Try not to miss a dose. Your doctor or health care professional will tell you when your next injection is due. Notify the office if you are unable to keep an appointment. What may interact with this medicine? -medicines for diabetes -medicines that treat or prevent blood clots like warfarin -oxyphenbutazone -propranolol -steroid medicines like prednisone or cortisone This list may not describe all possible interactions. Give your health care provider a list of all the medicines, herbs, non-prescription drugs, or  dietary supplements you use. Also tell them if you smoke, drink alcohol, or use illegal drugs. Some items may interact with your medicine. What should I watch for while using this medicine? Visit your doctor or health care professional for regular checks on your progress. They will need to check the level of testosterone in your blood. This medicine is only approved for use in men who have low levels of testosterone related to certain medical conditions. Heart attacks and strokes have been reported with the use of this medicine. Notify your doctor or health care professional and seek emergency treatment if you develop breathing problems; changes in vision; confusion; chest pain or chest tightness; sudden arm pain; severe, sudden headache; trouble speaking or understanding; sudden numbness or weakness of the face, arm or leg; loss of balance or coordination. Talk to your doctor about the risks and benefits of this medicine. This medicine may affect blood sugar levels. If you have diabetes, check with your doctor or health care professional before you change your diet or the dose of your diabetic medicine. Testosterone injections are not commonly used in women. Women should inform their doctor if they wish to become pregnant or think they might be pregnant. There is a potential for serious side effects to an unborn child. Talk to your health care professional or pharmacist for more information. Talk with your doctor or health care professional about your birth control options while taking this medicine. This drug is banned from use in athletes by most athletic organizations. What side effects may I notice from receiving this medicine? Side effects that you should report to your doctor or health care professional as soon as possible: -allergic reactions like skin rash,   itching or hives, swelling of the face, lips, or tongue -breast enlargement -breathing problems -changes in emotions or moods -deep or  hoarse voice -irregular menstrual periods -signs and symptoms of liver injury like dark yellow or brown urine; general ill feeling or flu-like symptoms; light-colored stools; loss of appetite; nausea; right upper belly pain; unusually weak or tired; yellowing of the eyes or skin -stomach pain -swelling of the ankles, feet, hands -too frequent or persistent erections -trouble passing urine or change in the amount of urine Side effects that usually do not require medical attention (report to your doctor or health care professional if they continue or are bothersome): -acne -change in sex drive or performance -facial hair growth -hair loss -headache This list may not describe all possible side effects. Call your doctor for medical advice about side effects. You may report side effects to FDA at 1-800-FDA-1088. Where should I keep my medicine? Keep out of the reach of children. This medicine can be abused. Keep your medicine in a safe place to protect it from theft. Do not share this medicine with anyone. Selling or giving away this medicine is dangerous and against the law. Store at room temperature between 20 and 25 degrees C (68 and 77 degrees F). Do not freeze. Protect from light. Follow the directions for the product you are prescribed. Throw away any unused medicine after the expiration date. NOTE: This sheet is a summary. It may not cover all possible information. If you have questions about this medicine, talk to your doctor, pharmacist, or health care provider.  2017 Elsevier/Gold Standard (2015-05-22 07:33:55)  

## 2016-03-22 DIAGNOSIS — E1122 Type 2 diabetes mellitus with diabetic chronic kidney disease: Secondary | ICD-10-CM | POA: Diagnosis not present

## 2016-03-22 DIAGNOSIS — L89322 Pressure ulcer of left buttock, stage 2: Secondary | ICD-10-CM | POA: Diagnosis not present

## 2016-03-22 DIAGNOSIS — N184 Chronic kidney disease, stage 4 (severe): Secondary | ICD-10-CM | POA: Diagnosis not present

## 2016-03-22 DIAGNOSIS — L03221 Cellulitis of neck: Secondary | ICD-10-CM | POA: Diagnosis not present

## 2016-03-22 DIAGNOSIS — I130 Hypertensive heart and chronic kidney disease with heart failure and stage 1 through stage 4 chronic kidney disease, or unspecified chronic kidney disease: Secondary | ICD-10-CM | POA: Diagnosis not present

## 2016-03-22 DIAGNOSIS — I5031 Acute diastolic (congestive) heart failure: Secondary | ICD-10-CM | POA: Diagnosis not present

## 2016-03-22 LAB — TESTOSTERONE: TESTOSTERONE: 189 ng/dL — AB (ref 264–916)

## 2016-03-22 LAB — RETICULOCYTES: Reticulocyte Count: 1.9 % (ref 0.6–2.6)

## 2016-03-22 LAB — ERYTHROPOIETIN: Erythropoietin: 34.8 m[IU]/mL — ABNORMAL HIGH (ref 2.6–18.5)

## 2016-03-24 DIAGNOSIS — I5031 Acute diastolic (congestive) heart failure: Secondary | ICD-10-CM | POA: Diagnosis not present

## 2016-03-24 DIAGNOSIS — E1122 Type 2 diabetes mellitus with diabetic chronic kidney disease: Secondary | ICD-10-CM | POA: Diagnosis not present

## 2016-03-24 DIAGNOSIS — I130 Hypertensive heart and chronic kidney disease with heart failure and stage 1 through stage 4 chronic kidney disease, or unspecified chronic kidney disease: Secondary | ICD-10-CM | POA: Diagnosis not present

## 2016-03-24 DIAGNOSIS — L03221 Cellulitis of neck: Secondary | ICD-10-CM | POA: Diagnosis not present

## 2016-03-24 DIAGNOSIS — L89322 Pressure ulcer of left buttock, stage 2: Secondary | ICD-10-CM | POA: Diagnosis not present

## 2016-03-24 DIAGNOSIS — N184 Chronic kidney disease, stage 4 (severe): Secondary | ICD-10-CM | POA: Diagnosis not present

## 2016-03-28 DIAGNOSIS — I130 Hypertensive heart and chronic kidney disease with heart failure and stage 1 through stage 4 chronic kidney disease, or unspecified chronic kidney disease: Secondary | ICD-10-CM | POA: Diagnosis not present

## 2016-03-28 DIAGNOSIS — N184 Chronic kidney disease, stage 4 (severe): Secondary | ICD-10-CM | POA: Diagnosis not present

## 2016-03-28 DIAGNOSIS — L89322 Pressure ulcer of left buttock, stage 2: Secondary | ICD-10-CM | POA: Diagnosis not present

## 2016-03-28 DIAGNOSIS — L03221 Cellulitis of neck: Secondary | ICD-10-CM | POA: Diagnosis not present

## 2016-03-28 DIAGNOSIS — E1122 Type 2 diabetes mellitus with diabetic chronic kidney disease: Secondary | ICD-10-CM | POA: Diagnosis not present

## 2016-03-28 DIAGNOSIS — I5031 Acute diastolic (congestive) heart failure: Secondary | ICD-10-CM | POA: Diagnosis not present

## 2016-03-29 ENCOUNTER — Other Ambulatory Visit: Payer: Self-pay

## 2016-03-29 NOTE — Patient Outreach (Signed)
[facility]) Care Management  03/29/2016  AWS MALLARE 1940/08/10 [id number]  REFERRAL DATE:  03/15/16 REFERRAL SOURCE: [name] REFERRAL REASON:  EMMI heart failure red alert: Weight:  148 lbs.  Second telephone call to patient regarding EMMI heart failure red alert. Unable to reach patient.  HIPAA complaint voice message left with call back phone number.  PLAN; RNCM will attempt 3rd telephone outreach to patient within 1 week.  Quinn Plowman RN,BSN,CCM [facility] Telephonic  [phone]

## 2016-03-30 ENCOUNTER — Ambulatory Visit: Payer: Self-pay

## 2016-03-30 DIAGNOSIS — I130 Hypertensive heart and chronic kidney disease with heart failure and stage 1 through stage 4 chronic kidney disease, or unspecified chronic kidney disease: Secondary | ICD-10-CM | POA: Diagnosis not present

## 2016-03-30 DIAGNOSIS — L89322 Pressure ulcer of left buttock, stage 2: Secondary | ICD-10-CM | POA: Diagnosis not present

## 2016-03-30 DIAGNOSIS — L03221 Cellulitis of neck: Secondary | ICD-10-CM | POA: Diagnosis not present

## 2016-03-30 DIAGNOSIS — N184 Chronic kidney disease, stage 4 (severe): Secondary | ICD-10-CM | POA: Diagnosis not present

## 2016-03-30 DIAGNOSIS — I5031 Acute diastolic (congestive) heart failure: Secondary | ICD-10-CM | POA: Diagnosis not present

## 2016-03-30 DIAGNOSIS — E1122 Type 2 diabetes mellitus with diabetic chronic kidney disease: Secondary | ICD-10-CM | POA: Diagnosis not present

## 2016-03-31 ENCOUNTER — Other Ambulatory Visit: Payer: Self-pay

## 2016-03-31 DIAGNOSIS — N184 Chronic kidney disease, stage 4 (severe): Secondary | ICD-10-CM | POA: Diagnosis not present

## 2016-03-31 DIAGNOSIS — I130 Hypertensive heart and chronic kidney disease with heart failure and stage 1 through stage 4 chronic kidney disease, or unspecified chronic kidney disease: Secondary | ICD-10-CM | POA: Diagnosis not present

## 2016-03-31 DIAGNOSIS — L89322 Pressure ulcer of left buttock, stage 2: Secondary | ICD-10-CM | POA: Diagnosis not present

## 2016-03-31 DIAGNOSIS — I5031 Acute diastolic (congestive) heart failure: Secondary | ICD-10-CM | POA: Diagnosis not present

## 2016-03-31 DIAGNOSIS — E1122 Type 2 diabetes mellitus with diabetic chronic kidney disease: Secondary | ICD-10-CM | POA: Diagnosis not present

## 2016-03-31 DIAGNOSIS — L03221 Cellulitis of neck: Secondary | ICD-10-CM | POA: Diagnosis not present

## 2016-03-31 NOTE — Patient Outreach (Signed)
[facility]) Care Management  03/31/2016  CARINA ALIRE Jul 29, 1940 [id number]  REFERRAL DATE; 03/15/16   REFERRAL SOURCE: [name] REFERRAL REASON:   [name] follow up CONSENT: self/ patient  PROVIDERS: Dr.  Dr. Florene Glen - nephrology  SOCIAL: Patient lives with spouse, Seananthony Reel Received physical [facility] services with [facility] Transportation to appointments provided by patients spouse, Wilson Singer  SUBJECTIVE; Telephone call to patient regarding [name] follow up. Patient states he has been feeling pretty good. Patient states his weight today is 150lbs. Patient states he is still receiving home physical therapy and nursing services. Patient denies any symptoms.  RNCM reviewed signs/ symptoms of heart failure. Discussed heart failure action plan with patient.  Patient advised to call doctor for heart failure symptoms.  Patient advised to call 911 for severe heart failure symptoms.  RNCM discussed with patient low salt diet. Patient states he does not add any extra salt to his meals and states his wife does not cook with salt. Patient states he has an appointment scheduled with his primary MD on Friday, April 07, 2016.  Patient verbally agreed to received heart failure education material from [facility].   PLAN; RNCM will follow up with patient within 1 week.  Quinn Plowman RN,BSN,CCM [facility] Telephonic  [phone]

## 2016-04-02 DIAGNOSIS — E1122 Type 2 diabetes mellitus with diabetic chronic kidney disease: Secondary | ICD-10-CM | POA: Diagnosis not present

## 2016-04-02 DIAGNOSIS — N184 Chronic kidney disease, stage 4 (severe): Secondary | ICD-10-CM | POA: Diagnosis not present

## 2016-04-02 DIAGNOSIS — I5031 Acute diastolic (congestive) heart failure: Secondary | ICD-10-CM | POA: Diagnosis not present

## 2016-04-02 DIAGNOSIS — I130 Hypertensive heart and chronic kidney disease with heart failure and stage 1 through stage 4 chronic kidney disease, or unspecified chronic kidney disease: Secondary | ICD-10-CM | POA: Diagnosis not present

## 2016-04-02 DIAGNOSIS — L89322 Pressure ulcer of left buttock, stage 2: Secondary | ICD-10-CM | POA: Diagnosis not present

## 2016-04-02 DIAGNOSIS — L03221 Cellulitis of neck: Secondary | ICD-10-CM | POA: Diagnosis not present

## 2016-04-03 ENCOUNTER — Other Ambulatory Visit: Payer: Self-pay

## 2016-04-03 NOTE — Patient Outreach (Signed)
[facility]) Care Management  04/03/2016  Larry [name] April 13, 1941 [id number]   REFERRAL DATE; 03/15/16 REFERRAL SOURCE: [name] REFERRAL REASON:  [name] follow up CONSENT: self/ patient  PROVIDERS: Dr.  Dr. Florene Glen - nephrology  SOCIAL: Patient lives with spouse, Larry [name] Received physical therapy services with [facility] Transportation to appointments provided by patients spouse, Larry [name]  EMMI heart failure RED alert received on patient for 03/31/16.  Patient was contacted on 03/31/16 by [facility]. No contact needed for today's referral.  PLAN:  RNCM will follow up with patient at next scheduled outreach.   Quinn Plowman RN,BSN,CCM [facility] Telephonic  [phone]

## 2016-04-04 ENCOUNTER — Other Ambulatory Visit: Payer: Self-pay

## 2016-04-04 DIAGNOSIS — L89322 Pressure ulcer of left buttock, stage 2: Secondary | ICD-10-CM | POA: Diagnosis not present

## 2016-04-04 DIAGNOSIS — L03221 Cellulitis of neck: Secondary | ICD-10-CM | POA: Diagnosis not present

## 2016-04-04 DIAGNOSIS — I10 Essential (primary) hypertension: Secondary | ICD-10-CM | POA: Diagnosis not present

## 2016-04-04 DIAGNOSIS — N184 Chronic kidney disease, stage 4 (severe): Secondary | ICD-10-CM | POA: Diagnosis not present

## 2016-04-04 DIAGNOSIS — E1122 Type 2 diabetes mellitus with diabetic chronic kidney disease: Secondary | ICD-10-CM | POA: Diagnosis not present

## 2016-04-04 DIAGNOSIS — I130 Hypertensive heart and chronic kidney disease with heart failure and stage 1 through stage 4 chronic kidney disease, or unspecified chronic kidney disease: Secondary | ICD-10-CM | POA: Diagnosis not present

## 2016-04-04 DIAGNOSIS — I5032 Chronic diastolic (congestive) heart failure: Secondary | ICD-10-CM | POA: Diagnosis not present

## 2016-04-04 DIAGNOSIS — N189 Chronic kidney disease, unspecified: Secondary | ICD-10-CM | POA: Diagnosis not present

## 2016-04-04 DIAGNOSIS — I5031 Acute diastolic (congestive) heart failure: Secondary | ICD-10-CM | POA: Diagnosis not present

## 2016-04-04 NOTE — Patient Outreach (Signed)
[facility]) Care Management  04/04/2016  Larry [name] 01-09-1941 [id number]   REFERRAL DATE; 03/15/16 REFERRAL SOURCE: [name] REFERRAL REASON:  EMMI heart failure RED alert for: weight, new worsening problem, and new swelling. CONSENT: self/ patient  PROVIDERS: Dr.  Dr. Florene Glen - nephrology  SOCIAL: Patient lives with spouse, [name] Received physical therapy services with [facility] Transportation to appointments provided by patients spouse, [name]   Telephone call to patient regarding EMMI heart failure red alert. Contact answering phone states patient is in the doctors office.  HIPAA compliant message left.  PLAN: RNCM will attempt 2nd outreach to patient within 1 week.   Quinn Plowman RN,BSN,CCM [facility] Telephonic  [phone]

## 2016-04-05 ENCOUNTER — Other Ambulatory Visit: Payer: Self-pay

## 2016-04-05 NOTE — Patient Outreach (Signed)
[name]) Care Management  04/05/2016  Larry [name] 1940-07-20 [id number]   Referral Date:  04/03/16 Larry [name] Weight up to 152 Admissions:  1 ED:  0 Insurance:  Medicare and supplemental Larry [name], working on getting services with the Larry [name], Larry [name].   Subjective: Outreach call #3 to patient.  Patient reached.   Larry [name] [place] [id number] [phone] (M Patient states doing well and completed PCP follow-up yesterday.    Providers: PCP:  Dr. Sheryn Bison - last appt:  04/04/16    [facility]:  First appt established with a PA on 04/20/16  Larry [name], [place] Nephrologist:  Dr. Florene Glen appt completed since discharge and another appt next week  Cardiologist:  None - Dr. Sheryn Bison working on referral  BP 128/61 03/21/2016 Weight 150 lb (68 kg) 03/31/2016  (04/05/16 home scale weight 151.6) Height 68 in (173 cm) 03/07/2016 BMI 22.81 (Normal) 03/31/2016  Lipid Panel N/D HDL N/D LDL N/D Cholesterol, total N/D Triglycerides N/D A1C 5.600 03/08/2016 Glucose Random 209.000 03/21/2016  Prevnar (PCV13) N/D Pneumovax (PP 03/10/2014 Flu Vaccine 01/12/2016 tDAP Vaccine 06/28/2015  Encounter Medications:  Outpatient Encounter Prescriptions as of 04/05/2016  Medication Sig Note  . allopurinol (ZYLOPRIM) 100 MG tablet Take 100 mg by mouth daily.    Marland Kitchen amLODipine (NORVASC) 10 MG tablet Take 10 mg by mouth daily.     Marland Kitchen aspirin EC 81 MG EC tablet Take 1 tablet (81 mg total) by mouth daily.   . carvedilol (COREG) 3.125 MG tablet Take 1 tablet (3.125 mg total) by mouth 2 (two) times daily with a meal.   . doxazosin (CARDURA) 4 MG tablet Take 8 mg by mouth 2 (two) times daily. 2 tabs twice daily 06/28/2015: 4 tabs daily  . furosemide (LASIX) 40 MG tablet Take 160 mg by mouth 2 (two) times daily. 03/21/2016: Received from: External Pharmacy  . furosemide (LASIX) 80 MG tablet Take 2 tablets (160 mg total) by mouth 2 (two) times daily.   Marland Kitchen JANUVIA 100 MG tablet  Take 0.25 Doses by mouth 1 day or 1 dose. 03/21/2016: Received from: External Pharmacy  . levothyroxine (SYNTHROID, LEVOTHROID) 175 MCG tablet Take 175 mcg by mouth daily before breakfast.   . omeprazole (PRILOSEC) 20 MG capsule Take 20 mg by mouth 2 (two) times daily.    . sitaGLIPtin (JANUVIA) 25 MG tablet Take 1 tablet (25 mg total) by mouth daily.   Marland Kitchen triamcinolone ointment (KENALOG) 0.1 % Apply 1 application topically daily as needed (for irritation).  05/18/2015: Received from: External Pharmacy   No facility-administered encounter medications on file as of 04/05/2016.     Functional Status:  In your present state of health, do you have any difficulty performing the following activities: 03/07/2016  Hearing? N  Vision? N  Difficulty concentrating or making decisions? N  Walking or climbing stairs? Y  Dressing or bathing? N  Doing errands, shopping? N  Some recent data might be hidden    Fall/Depression Screening: PHQ 2/9 Scores 03/16/2016 07/15/2013  PHQ - 2 Score 0 0    Assessment / Plan: RN CM encouraged to notify MD of any weight gain.  RN CM reviewed signs and symptoms of [name] and when to contact MD.  RN CM encouraged to remain adherent to medication regime and all appt's; call MD with any changes in condition.   Larry [name], BSN, RN, [place] Management Care Management Coordinator [phone] Direct [phone] Cell [phone] Office [phone] Fax [name]

## 2016-04-06 ENCOUNTER — Other Ambulatory Visit: Payer: Self-pay

## 2016-04-06 ENCOUNTER — Ambulatory Visit: Payer: Self-pay

## 2016-04-06 DIAGNOSIS — L89322 Pressure ulcer of left buttock, stage 2: Secondary | ICD-10-CM | POA: Diagnosis not present

## 2016-04-06 DIAGNOSIS — E1122 Type 2 diabetes mellitus with diabetic chronic kidney disease: Secondary | ICD-10-CM | POA: Diagnosis not present

## 2016-04-06 DIAGNOSIS — I5031 Acute diastolic (congestive) heart failure: Secondary | ICD-10-CM | POA: Diagnosis not present

## 2016-04-06 DIAGNOSIS — N184 Chronic kidney disease, stage 4 (severe): Secondary | ICD-10-CM | POA: Diagnosis not present

## 2016-04-06 DIAGNOSIS — I130 Hypertensive heart and chronic kidney disease with heart failure and stage 1 through stage 4 chronic kidney disease, or unspecified chronic kidney disease: Secondary | ICD-10-CM | POA: Diagnosis not present

## 2016-04-06 DIAGNOSIS — L03221 Cellulitis of neck: Secondary | ICD-10-CM | POA: Diagnosis not present

## 2016-04-06 NOTE — Patient Outreach (Signed)
[facility]) Care Management  04/06/2016  Larry [name] 02/03/1941 [id number]   Larry [name] Heart Failure Program  Larry [name] Dashboard alert 04/06/16 weight 152.   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   Subjective: Larry [name] [id number] [phone] (M States no issues with weight; states 151.6 and rounded off to 152.    Providers: PCP:  Dr. Aura [name]. [place] [facility] - last appt:  04/04/16 [id number]     [facility]:  First appt established with a PA on 04/20/16  Larry [name], [place] Nephrologist:  Dr. Florene [name] appt completed since discharge and another appt next week  Cardiologist:  None  But states Dr. Sheryn [name] working on referral but has not heard any updates yet.   Co-morbidities: HTN, CHF, DM, chronic renal insufficiency (stage II mild).  Admission:  03/07/2016 - 123456 Acute diastolic CHF Patient is weighing daily.   BP 128/61 03/21/2016 Weight 150 lb (68 kg) 03/31/2016  (04/05/16 home scale weight 151.6) Height 68 in (173 cm) 03/07/2016 BMI 22.81 (Normal) 03/31/2016  Lipid Panel N/D HDL N/D LDL N/D Cholesterol, total N/D Triglycerides N/D A1C 5.600 03/08/2016 Glucose Random 209.000 03/21/2016  Prevnar (PCV13) N/D Pneumovax (PP 03/10/2014 Flu Vaccine 01/12/2016 tDAP Vaccine 06/28/2015  Encounter Medications:  Outpatient Encounter Prescriptions as of 04/06/2016  Medication Sig Note  . allopurinol (ZYLOPRIM) 100 MG tablet Take 100 mg by mouth daily.    Marland Kitchen amLODipine (NORVASC) 10 MG tablet Take 10 mg by mouth daily.     Marland Kitchen aspirin EC 81 MG EC tablet Take 1 tablet (81 mg total) by mouth daily.   . carvedilol (COREG) 3.125 MG tablet Take 1 tablet (3.125 mg total) by mouth 2 (two) times daily with a meal.   . doxazosin (CARDURA) 4 MG tablet Take 8 mg by mouth 2 (two) times daily. 2 tabs twice daily 06/28/2015: 4 tabs daily  . furosemide (LASIX) 40 MG tablet Take  160 mg by mouth 2 (two) times daily. 03/21/2016: Received from: External Pharmacy  . furosemide (LASIX) 80 MG tablet Take 2 tablets (160 mg total) by mouth 2 (two) times daily.   Marland Kitchen JANUVIA 100 MG tablet Take 0.25 Doses by mouth 1 day or 1 dose. 03/21/2016: Received from: External Pharmacy  . levothyroxine (SYNTHROID, LEVOTHROID) 175 MCG tablet Take 175 mcg by mouth daily before breakfast.   . omeprazole (PRILOSEC) 20 MG capsule Take 20 mg by mouth 2 (two) times daily.    . sitaGLIPtin (JANUVIA) 25 MG tablet Take 1 tablet (25 mg total) by mouth daily.   Marland Kitchen triamcinolone ointment (KENALOG) 0.1 % Apply 1 application topically daily as needed (for irritation).  05/18/2015: Received from: External Pharmacy   No facility-administered encounter medications on file as of 04/06/2016.     Functional Status:  In your present state of health, do you have any difficulty performing the following activities: 03/07/2016  Hearing? N  Vision? N  Difficulty concentrating or making decisions? N  Walking or climbing stairs? Y  Dressing or bathing? N  Doing errands, shopping? N  Some recent data might be hidden    Fall/Depression Screening: PHQ 2/9 Scores 03/16/2016 07/15/2013  PHQ - 2 Score 0 0    Assessment / Plan: RN CM encouraged to notify MD of any weight gain.  RN CM reviewed signs and symptoms of HF and when to contact MD.  RN CM encouraged to remain adherent to medication regime and all appt's; call  MD with any changes in condition.  RN CM will contact Dr. Aura [name] to follow-up on Cardiology referral progress.    Larry [name], BSN, RN, [name] Management Care Management Coordinator [phone] Direct [phone] Cell [phone] Office [phone] Fax [name]

## 2016-04-06 NOTE — Patient Outreach (Signed)
[facility]) Care Management  04/06/2016  TAMER GREIM 27-Feb-1941 [id number]   [name]  Care Coordination of Services  Contact call to PCP:  Dr. Aura Dials Issue:  Cardiologist:  None  But states Dr. Sheryn Bison working on referral but has not heard any updates yet.  Admission:  03/07/2016 - 123456 Acute diastolic CHF Contact will notify nurse for appropriate follow-up.  RN CM left name and contact # for call back with updates.   [name], BSN, RN, [place] Care Management Care Management Coordinator [phone] Direct [phone] Cell [phone] Office [phone] Fax [email or web address]

## 2016-04-10 DIAGNOSIS — I5031 Acute diastolic (congestive) heart failure: Secondary | ICD-10-CM | POA: Diagnosis not present

## 2016-04-10 DIAGNOSIS — L89322 Pressure ulcer of left buttock, stage 2: Secondary | ICD-10-CM | POA: Diagnosis not present

## 2016-04-10 DIAGNOSIS — I130 Hypertensive heart and chronic kidney disease with heart failure and stage 1 through stage 4 chronic kidney disease, or unspecified chronic kidney disease: Secondary | ICD-10-CM | POA: Diagnosis not present

## 2016-04-10 DIAGNOSIS — E1122 Type 2 diabetes mellitus with diabetic chronic kidney disease: Secondary | ICD-10-CM | POA: Diagnosis not present

## 2016-04-10 DIAGNOSIS — N184 Chronic kidney disease, stage 4 (severe): Secondary | ICD-10-CM | POA: Diagnosis not present

## 2016-04-10 DIAGNOSIS — L03221 Cellulitis of neck: Secondary | ICD-10-CM | POA: Diagnosis not present

## 2016-04-11 ENCOUNTER — Ambulatory Visit: Payer: Medicare Other

## 2016-04-13 DIAGNOSIS — E1122 Type 2 diabetes mellitus with diabetic chronic kidney disease: Secondary | ICD-10-CM | POA: Diagnosis not present

## 2016-04-13 DIAGNOSIS — E213 Hyperparathyroidism, unspecified: Secondary | ICD-10-CM | POA: Diagnosis not present

## 2016-04-13 DIAGNOSIS — I10 Essential (primary) hypertension: Secondary | ICD-10-CM | POA: Diagnosis not present

## 2016-04-13 DIAGNOSIS — I130 Hypertensive heart and chronic kidney disease with heart failure and stage 1 through stage 4 chronic kidney disease, or unspecified chronic kidney disease: Secondary | ICD-10-CM | POA: Diagnosis not present

## 2016-04-13 DIAGNOSIS — I5031 Acute diastolic (congestive) heart failure: Secondary | ICD-10-CM | POA: Diagnosis not present

## 2016-04-13 DIAGNOSIS — L03221 Cellulitis of neck: Secondary | ICD-10-CM | POA: Diagnosis not present

## 2016-04-13 DIAGNOSIS — L89322 Pressure ulcer of left buttock, stage 2: Secondary | ICD-10-CM | POA: Diagnosis not present

## 2016-04-13 DIAGNOSIS — N184 Chronic kidney disease, stage 4 (severe): Secondary | ICD-10-CM | POA: Diagnosis not present

## 2016-04-13 DIAGNOSIS — R600 Localized edema: Secondary | ICD-10-CM | POA: Diagnosis not present

## 2016-04-14 DIAGNOSIS — L03221 Cellulitis of neck: Secondary | ICD-10-CM | POA: Diagnosis not present

## 2016-04-14 DIAGNOSIS — E1122 Type 2 diabetes mellitus with diabetic chronic kidney disease: Secondary | ICD-10-CM | POA: Diagnosis not present

## 2016-04-14 DIAGNOSIS — N184 Chronic kidney disease, stage 4 (severe): Secondary | ICD-10-CM | POA: Diagnosis not present

## 2016-04-14 DIAGNOSIS — I130 Hypertensive heart and chronic kidney disease with heart failure and stage 1 through stage 4 chronic kidney disease, or unspecified chronic kidney disease: Secondary | ICD-10-CM | POA: Diagnosis not present

## 2016-04-14 DIAGNOSIS — L89322 Pressure ulcer of left buttock, stage 2: Secondary | ICD-10-CM | POA: Diagnosis not present

## 2016-04-14 DIAGNOSIS — I5031 Acute diastolic (congestive) heart failure: Secondary | ICD-10-CM | POA: Diagnosis not present

## 2016-04-18 NOTE — Patient Outreach (Signed)
Patient triggered Red on EMMI heart failure dashboard, notification sent to Mariann Laster, RN

## 2016-04-19 ENCOUNTER — Other Ambulatory Visit: Payer: Self-pay

## 2016-04-19 NOTE — Patient Outreach (Signed)
Larry [name]) Care Management  04/19/2016  Larry [name] 1940-08-13 Larry [name]   Larry Heart Failure Program   Referral date:  04/18/16 Source:  Larry [name] RED alert:   Issue:  Weight Insurance:  Engineer, mining, working on Insurance account manager with the Larry [name], Larry [name].   Subjective: Larry [name] [id number] [phone] (M  Providers: PCP:  Larry [name]. Larry [name] at Larry [name] - last appt:  04/04/16 Larry [name]     [facility]:  First appt established with a PA on 04/20/16  Larry [name], Larry [name] Nephrologist:  Dr. Florene [name] appt completed since discharge and another appt next week  Cardiologist:  Update:  Initial appt scheduled for 05/05/16 at [place]   Co-morbidities: HTN, CHF, DM, chronic renal insufficiency (stage II mild).  Admission:  03/07/2016 - 123456 Acute diastolic CHF Patient is weighing daily.  Dr. Florene [name] has changed Lasix to 80 mt tab 2 tabs every morning and 1 tab every evening.   States MD advised patient that patient's weight may increase by 5 lbs but then should stabilize.  States weight at Dr. Abel [name] visit was 153 and was 157 this morning.  Patient verbalized good understanding of this process.   BP 128/61 03/21/2016 Weight 150 lb (68 kg) 03/31/2016  (04/05/16 home scale weight 151.6) Height 68 in (173 cm) 03/07/2016 BMI 22.81 (Normal) 03/31/2016  BP 128/61 04/05/2016 Weight 152 lb (69 kg) 04/06/2016 Height 68 in (173 cm) 04/05/2016 BMI 23.05 (Normal) 04/06/2016  Lipid Panel N/D HDL N/D LDL N/D Cholesterol, total N/D Triglycerides N/D A1C 5.600 03/08/2016 Glucose Random 209.000 03/21/2016  Medications:  Prevnar (PCV13) N/D Pneumovax (PP 03/10/2014 Flu Vaccine 01/12/2016 tDAP Vaccine 06/28/2015  Encounter Medications:   Dr. Florene [name] has changed Lasix to 80 mt tab 2 tabs every morning and 1 tab every evening.  Outpatient Encounter Prescriptions as of 04/19/2016   Medication Sig Note  . allopurinol (ZYLOPRIM) 100 MG tablet Take 100 mg by mouth daily.    Marland Kitchen amLODipine (NORVASC) 10 MG tablet Take 10 mg by mouth daily.     Marland Kitchen aspirin EC 81 MG EC tablet Take 1 tablet (81 mg total) by mouth daily.   . carvedilol (COREG) 3.125 MG tablet Take 1 tablet (3.125 mg total) by mouth 2 (two) times daily with a meal.   . doxazosin (CARDURA) 4 MG tablet Take 8 mg by mouth 2 (two) times daily. 2 tabs twice daily 06/28/2015: 4 tabs daily  . furosemide (LASIX) 80 MG tablet Take 2 tablets (160 mg total) by mouth 2 (two) times daily. 04/20/2016: Taking 2 tablets morning and 1 tablet night.   Marland Kitchen JANUVIA 100 MG tablet Take 0.25 Doses by mouth 1 day or 1 dose. 03/21/2016: Received from: External Pharmacy  . levothyroxine (SYNTHROID, LEVOTHROID) 175 MCG tablet Take 175 mcg by mouth daily before breakfast.   . omeprazole (PRILOSEC) 20 MG capsule Take 20 mg by mouth 2 (two) times daily.    . sitaGLIPtin (JANUVIA) 25 MG tablet Take 1 tablet (25 mg total) by mouth daily.   Marland Kitchen triamcinolone ointment (KENALOG) 0.1 % Apply 1 application topically daily as needed (for irritation).  05/18/2015: Received from: External Pharmacy  . furosemide (LASIX) 40 MG tablet Take 160 mg by mouth 2 (two) times daily. 03/21/2016: Received from: External Pharmacy   No facility-administered encounter medications on file as of 04/19/2016.     Functional Status:  In your present state of health, do you have any difficulty performing the  following activities: 03/07/2016  Hearing? N  Vision? N  Difficulty concentrating or making decisions? N  Walking or climbing stairs? Y  Dressing or bathing? N  Doing errands, shopping? N  Some recent data might be hidden    Fall/Depression Screening: PHQ 2/9 Scores 03/16/2016 07/15/2013  PHQ - 2 Score 0 0    Assessment / Plan: Telephonic RN CM services 04/19/16 Program:  HF 04/19/16 Screening and Initial Assessment:  04/19/16  CHF RN CM encouraged to notify MD of  any weight gain.  RN CM reviewed signs and symptoms of HF and when to contact MD.  RN CM encouraged to remain adherent to medication regime and all appt's; call MD with any changes in condition.   Coordination of Services:  [facility] RN CM will assist with Larry [name] as needed.  RN CM will monitor patients progress and assist if needed.   Advance Directives:   RN CM will continue to provide education on the importance of having Advance Directive / HCPOA established.   Larry Education (mailed 04/20/2016) -Heart Failure-Home Monitoring  -Heart Failure:  Keeping Track Of Your Weight Each Day -Heart Failure:  How To Be Salt Smart -Heart Failure:  Understanding Water Pills and Thirst -Heart Failure - Tests -Heart Failure:  When To Call Your Doctor Or 911  -Heart Failure:  Heart Zone Action Magnet  RN CM advised in next Larry [name] scheduled contact call within next 30 days for monthly assessment and / or care coordination services as needed.  RN CM advised to please notify MD of any changes in condition prior to scheduled appt's.   RN CM provided contact name and # [phone] or main office # [phone] and 24-hour nurse line # [phone].  RN CM confirmed patient is aware of 911 services for urgent emergency needs.  RN CM sent successful outreach letter and  Larry [name] Introductory package. RN CM sent Physician Enrollment/Barriers Letter and Initial Assessment to Primary MD RN CM notified Larry [name] Management Assistant: agreed to services/case opened.  Larry [name] CM Care Plan Problem One   Flowsheet Row Most Recent Value  Care Plan Problem One  Knowledge deficit relating to HF self management.   Role Documenting the Problem One  Care Management Telephonic [place] for Problem One  Active  THN Long Term Goal (31-90 days)  Patient will engage with RN CM for edcuation and review over the next 31-90 days.   THN Long Term Goal Start Date  04/20/16  Interventions for Problem One Long  Term Goal  RN CM will provide education on self management interventions in the home over the next 31-90 days.   THN CM Short Term Goal #1 (0-30 days)  Patient will provide teach back on Red Zone action magnet over the next 30 days.   THN CM Short Term Goal #1 Start Date  04/20/16  Interventions for Short Term Goal #1  RN CM will provide Heart Zone Action magnet via mail over the next 30 days.   THN CM Short Term Goal #2 (0-30 days)  Patient will weigh daily and log over the next 30 days.   THN CM Short Term Goal #2 Start Date  04/20/16  Interventions for Short Term Goal #2  RN CM will provide Larry education on importance of daily weights over the next 30 days.     THN CM Care Plan Problem Two   Flowsheet Row Most Recent Value  Care Plan Problem Two  Medications:  Lasix dosage modified.  Role Documenting the Problem Two  Care Management Telephonic Coordinator  Care Plan for Problem Two  Active  THN CM Short Term Goal #1 (0-30 days)  Patient will observe weight gain and report weight gain to MD as directed by MD over the next 30 days.   THN CM Short Term Goal #1 Start Date  04/20/16  Interventions for Short Term Goal #2   RN CM will continue to monitor and assess patients ability to log weight and report when needed over the next 30 days.     [facility] CM Care Plan Problem Three   Flowsheet Row Most Recent Value  Care Plan Problem Three  [facility] Service Coordination   Role Documenting the Problem Three  Care Management Telephonic Coordinator  Care Plan for Problem Three  Active  THN CM Short Term Goal #1 (0-30 days)  Patient will progress with establishing Veterans Benefits over the next 30 days.   THN CM Short Term Goal #1 Start Date  04/20/16  Interventions for Short Term Goal #1  RN CM will assist as needed with coordinating [employer] over the next 30 days.      [name], BSN, RN, [place] Management Care Management  Coordinator [phone] Direct [phone] Cell [phone] Office [phone] Fax [email or web address]

## 2016-04-20 ENCOUNTER — Emergency Department (HOSPITAL_BASED_OUTPATIENT_CLINIC_OR_DEPARTMENT_OTHER): Payer: Medicare Other

## 2016-04-20 ENCOUNTER — Other Ambulatory Visit: Payer: Self-pay

## 2016-04-20 ENCOUNTER — Other Ambulatory Visit (HOSPITAL_BASED_OUTPATIENT_CLINIC_OR_DEPARTMENT_OTHER): Payer: Medicare Other

## 2016-04-20 ENCOUNTER — Encounter (HOSPITAL_BASED_OUTPATIENT_CLINIC_OR_DEPARTMENT_OTHER): Payer: Self-pay | Admitting: *Deleted

## 2016-04-20 ENCOUNTER — Other Ambulatory Visit: Payer: Self-pay | Admitting: *Deleted

## 2016-04-20 ENCOUNTER — Ambulatory Visit: Payer: Medicare Other

## 2016-04-20 ENCOUNTER — Ambulatory Visit (HOSPITAL_BASED_OUTPATIENT_CLINIC_OR_DEPARTMENT_OTHER): Payer: Medicare Other | Admitting: Hematology & Oncology

## 2016-04-20 ENCOUNTER — Emergency Department (HOSPITAL_BASED_OUTPATIENT_CLINIC_OR_DEPARTMENT_OTHER)
Admission: EM | Admit: 2016-04-20 | Discharge: 2016-04-20 | Disposition: A | Discharge status: Home / Self Care | Payer: Medicare Other | Attending: Emergency Medicine | Admitting: Emergency Medicine

## 2016-04-20 ENCOUNTER — Encounter: Payer: Self-pay | Admitting: *Deleted

## 2016-04-20 DIAGNOSIS — I130 Hypertensive heart and chronic kidney disease with heart failure and stage 1 through stage 4 chronic kidney disease, or unspecified chronic kidney disease: Secondary | ICD-10-CM | POA: Insufficient documentation

## 2016-04-20 DIAGNOSIS — E1122 Type 2 diabetes mellitus with diabetic chronic kidney disease: Secondary | ICD-10-CM | POA: Insufficient documentation

## 2016-04-20 DIAGNOSIS — D509 Iron deficiency anemia, unspecified: Secondary | ICD-10-CM | POA: Diagnosis not present

## 2016-04-20 DIAGNOSIS — C8307 Small cell B-cell lymphoma, spleen: Secondary | ICD-10-CM

## 2016-04-20 DIAGNOSIS — Z9081 Acquired absence of spleen: Secondary | ICD-10-CM | POA: Diagnosis not present

## 2016-04-20 DIAGNOSIS — N189 Chronic kidney disease, unspecified: Secondary | ICD-10-CM

## 2016-04-20 DIAGNOSIS — R55 Syncope and collapse: Secondary | ICD-10-CM | POA: Insufficient documentation

## 2016-04-20 DIAGNOSIS — E291 Testicular hypofunction: Secondary | ICD-10-CM

## 2016-04-20 DIAGNOSIS — E039 Hypothyroidism, unspecified: Secondary | ICD-10-CM | POA: Diagnosis not present

## 2016-04-20 DIAGNOSIS — N184 Chronic kidney disease, stage 4 (severe): Secondary | ICD-10-CM | POA: Diagnosis not present

## 2016-04-20 DIAGNOSIS — E349 Endocrine disorder, unspecified: Secondary | ICD-10-CM

## 2016-04-20 DIAGNOSIS — Z79899 Other long term (current) drug therapy: Secondary | ICD-10-CM | POA: Diagnosis not present

## 2016-04-20 DIAGNOSIS — R42 Dizziness and giddiness: Secondary | ICD-10-CM | POA: Diagnosis not present

## 2016-04-20 DIAGNOSIS — Z7982 Long term (current) use of aspirin: Secondary | ICD-10-CM | POA: Insufficient documentation

## 2016-04-20 DIAGNOSIS — I5031 Acute diastolic (congestive) heart failure: Secondary | ICD-10-CM | POA: Insufficient documentation

## 2016-04-20 DIAGNOSIS — Z8579 Personal history of other malignant neoplasms of lymphoid, hematopoietic and related tissues: Secondary | ICD-10-CM

## 2016-04-20 DIAGNOSIS — N182 Chronic kidney disease, stage 2 (mild): Secondary | ICD-10-CM | POA: Diagnosis not present

## 2016-04-20 LAB — IRON AND TIBC
%SAT: 53 % (ref 20–55)
IRON: 124 ug/dL (ref 42–163)
TIBC: 235 ug/dL (ref 202–409)
UIBC: 111 ug/dL — AB (ref 117–376)

## 2016-04-20 LAB — CBC WITH DIFFERENTIAL (CANCER CENTER ONLY)
BASO#: 0.1 10*3/uL (ref 0.0–0.2)
BASO%: 1.2 % (ref 0.0–2.0)
EOS ABS: 0.4 10*3/uL (ref 0.0–0.5)
EOS%: 7.8 % — ABNORMAL HIGH (ref 0.0–7.0)
HCT: 32.7 % — ABNORMAL LOW (ref 38.7–49.9)
HEMOGLOBIN: 11.3 g/dL — AB (ref 13.0–17.1)
LYMPH#: 1.2 10*3/uL (ref 0.9–3.3)
LYMPH%: 23.1 % (ref 14.0–48.0)
MCH: 35.2 pg — AB (ref 28.0–33.4)
MCHC: 34.6 g/dL (ref 32.0–35.9)
MCV: 102 fL — AB (ref 82–98)
MONO#: 1 10*3/uL — AB (ref 0.1–0.9)
MONO%: 20 % — ABNORMAL HIGH (ref 0.0–13.0)
NEUT%: 47.9 % (ref 40.0–80.0)
NEUTROS ABS: 2.4 10*3/uL (ref 1.5–6.5)
PLATELETS: 218 10*3/uL (ref 145–400)
RBC: 3.21 10*6/uL — AB (ref 4.20–5.70)
RDW: 14.1 % (ref 11.1–15.7)
WBC: 5.1 10*3/uL (ref 4.0–10.0)

## 2016-04-20 LAB — CMP (CANCER CENTER ONLY)
ALBUMIN: 3.2 g/dL — AB (ref 3.3–5.5)
ALK PHOS: 78 U/L (ref 26–84)
ALT: 23 U/L (ref 10–47)
AST: 29 U/L (ref 11–38)
BUN: 86 mg/dL — AB (ref 7–22)
CO2: 28 mEq/L (ref 18–33)
CREATININE: 2.9 mg/dL — AB (ref 0.6–1.2)
Calcium: 9.1 mg/dL (ref 8.0–10.3)
Chloride: 103 mEq/L (ref 98–108)
Glucose, Bld: 148 mg/dL — ABNORMAL HIGH (ref 73–118)
Sodium: 140 mEq/L (ref 128–145)
TOTAL PROTEIN: 6.9 g/dL (ref 6.4–8.1)
Total Bilirubin: 0.7 mg/dl (ref 0.20–1.60)

## 2016-04-20 LAB — TROPONIN I: TROPONIN I: 0.04 ng/mL — AB (ref <0.03)

## 2016-04-20 LAB — FERRITIN: FERRITIN: 249 ng/mL (ref 22–316)

## 2016-04-20 LAB — LACTATE DEHYDROGENASE: LDH: 189 U/L (ref 125–245)

## 2016-04-20 MED ORDER — SODIUM CHLORIDE 0.9 % IV BOLUS (SEPSIS)
500.0000 mL | Freq: Once | INTRAVENOUS | Status: AC
  Administered 2016-04-20: 500 mL via INTRAVENOUS

## 2016-04-20 NOTE — ED Triage Notes (Signed)
Pt states he felt week while playing [place] upstairs in this facility and became very diaphoretic. No other sx. Amy RN from ED helped with pt and first observed pt to be pale and diaphoretic sitting in lab chair. Pt alert and was relayed to staff that his SBP was 60.. On arrival to room pt alert and oriented and denies pain or feeling bad prior to event.

## 2016-04-20 NOTE — Discharge Instructions (Signed)
Go to the [place] as planned.

## 2016-04-20 NOTE — Telephone Encounter (Signed)
This encounter was created in error - please disregard.

## 2016-04-20 NOTE — Progress Notes (Signed)
Hematology and Oncology Follow Up Visit  Larry [name] [id number] December 22, 1940 75 y.o. 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  2. Hypotestosteronemia.  3. Chronic renal insufficiency.  Current Therapy:   Testosterone 400 mg IM once a month    Interim History: Larry [name] is here today for a follow-up. Unfortunately, he had an episode of near syncope. He was in the office with his wife. They do the [place] rounds. He and his wife dressup as Mr. and Mrs. [facility] and see the patient's.  She said that he was doing well yesterday. Had no problems this morning at home. He ate breakfast.   He did become diaphoretic. He became somewhat weak. He had a near syncopal episode.   He's had no fever. He's had no bleeding. He's had no bowel or bladder incontinence.   He has had chronic renal insufficiency. He does have diabetes. His blood sugar we checked his stat glucose was 132.   He had a little bit of disorientation.  Medications:  Allergies as of 04/20/2016      Reactions   Iohexol Other (See Comments)   Pt states has history of reaction in the past, can not remember what exactly happened, but was told by the dr never to have the contrast again.      Medication List       Accurate as of 04/20/16 11:48 AM. Always use your most recent med list.          allopurinol 100 MG tablet Commonly known as:  ZYLOPRIM Take 100 mg by mouth daily.   amLODipine 10 MG tablet Commonly known as:  NORVASC Take 10 mg by mouth daily.   aspirin 81 MG EC tablet Take 1 tablet (81 mg total) by mouth daily.   carvedilol 3.125 MG tablet Commonly known as:  COREG Take 1 tablet (3.125 mg total) by mouth 2 (two) times daily with a meal.   doxazosin 4 MG tablet Commonly known as:  CARDURA Take 8 mg by mouth 2 (two) times daily. 2 tabs twice daily   furosemide 40 MG tablet Commonly known as:  LASIX Take 160 mg by mouth 2  (two) times daily.   furosemide 80 MG tablet Commonly known as:  LASIX Take 2 tablets (160 mg total) by mouth 2 (two) times daily.   levothyroxine 175 MCG tablet Commonly known as:  SYNTHROID, LEVOTHROID Take 175 mcg by mouth daily before breakfast.   omeprazole 20 MG capsule Commonly known as:  PRILOSEC Take 20 mg by mouth 2 (two) times daily.   JANUVIA 100 MG tablet Generic drug:  sitaGLIPtin Take 0.25 Doses by mouth 1 day or 1 dose.   sitaGLIPtin 25 MG tablet Commonly known as:  JANUVIA Take 1 tablet (25 mg total) by mouth daily.   triamcinolone ointment 0.1 % Commonly known as:  KENALOG Apply 1 application topically daily as needed (for irritation).       Allergies:  Allergies  Allergen Reactions  . Iohexol Other (See Comments)    Pt states has history of reaction in the past, can not remember what exactly happened, but was told by the dr never to have the contrast again.    Past Medical History, Surgical history, Social history, and Family History were reviewed and updated.  Review of Systems: All other 10 point review of systems is negative.   Physical Exam:  vitals were not taken for this visit.  Wt  Readings from Last 3 Encounters:  04/20/16 157 lb (71.2 kg)  04/06/16 151 lb 9.6 oz (68.8 kg)  04/05/16 157 lb (71.2 kg)    Well-developed and well-nourished white male in no obvious distress. Head and neck exam shows no ocular or oral lesions. He has no scleral icterus. There is no palpable cervical or supraclavicular lymph nodes. Lungs are clear bilaterally. Cardiac exam regular rate and rhythm with no with a 2/6 systolic ejection murmur. Abdomen is soft. Has a splint to be scar. Has a laparotomy scar in the midline. A fluid wave is noted. There is no guarding or rebound tenderness. There is no palpable hepatomegaly. Extremities shows 3+ chronic edema in his lower legs. Skin exam shows some actinic keratoses. Neurological exam shows no focal neurological  deficits.  Lab Results  Component Value Date   WBC 5.1 04/20/2016   HGB 11.3 (L) 04/20/2016   HCT 32.7 (L) 04/20/2016   MCV 102 (H) 04/20/2016   PLT 218 04/20/2016   Lab Results  Component Value Date   FERRITIN 559 (H) 03/21/2016   IRON 84 03/21/2016   TIBC 205 03/21/2016   UIBC 121 03/21/2016   IRONPCTSAT 41 03/21/2016   Lab Results  Component Value Date   RETICCTPCT 0.9 03/29/2011   RBC 3.21 (L) 04/20/2016   RETICCTABS 31.8 03/29/2011   No results found for: KPAFRELGTCHN, LAMBDASER, KAPLAMBRATIO No results found for: Kandis Cocking, [facility] Lab Results  Component Value Date   TOTALPROTELP 6.7 07/14/2011   ALBUMINELP 56.6 07/14/2011   A1GS 5.7 (H) 07/14/2011   A2GS 12.7 (H) 07/14/2011   BETS 4.9 07/14/2011   BETA2SER 3.8 07/14/2011   GAMS 16.3 07/14/2011   MSPIKE NOT DET 07/14/2011   SPEI * 07/14/2011     Chemistry      Component Value Date/Time   NA 140 04/20/2016 1059   NA 142 02/15/2016 0953   K 4.8 No visible hemolysis (H) 04/20/2016 1059   K 4.4 02/15/2016 0953   CL 103 04/20/2016 1059   CO2 28 04/20/2016 1059   CO2 23 02/15/2016 0953   BUN 86 (H) 04/20/2016 1059   BUN 47.4 (H) 02/15/2016 0953   CREATININE 2.9 (H) 04/20/2016 1059   CREATININE 2.6 (H) 02/15/2016 0953      Component Value Date/Time   CALCIUM 9.1 04/20/2016 1059   CALCIUM 9.1 02/15/2016 0953   ALKPHOS 78 04/20/2016 1059   ALKPHOS 99 02/15/2016 0953   AST 29 04/20/2016 1059   AST 16 02/15/2016 0953   ALT 23 04/20/2016 1059   ALT 7 02/15/2016 0953   BILITOT 0.70 04/20/2016 1059   BILITOT 0.93 02/15/2016 0953     Impression and Plan: Larry [name] is a very pleasant 75 yo male with hypotestosteronemia. He also has a history of splenic marginal zone lymphoma. He had a splenectomy in 2009 and completed 1 cycle of Rituxan in April 2010. So far, there has been no evidence of recurrence.   It looks like he had a vasovagal reaction in the office. He has wife were here as Larry [name] and  Larry [name]. He all of a sudden got weak. He got diaphoretic. His blood pressure was 61/30. His pulse was 50.   We checked his blood sugar. It was 132.   The ER staff came up. They got him down to the emergency room.   We will have to see what their workup found.  I'll plan to get him back in one month. Volanda Napoleon, MD 12/21/201711:48 AM

## 2016-04-21 LAB — TESTOSTERONE: Testosterone, Serum: 302 ng/dL (ref 264–916)

## 2016-04-21 LAB — RETICULOCYTES: Reticulocyte Count: 0.7 % (ref 0.6–2.6)

## 2016-04-21 NOTE — ED Provider Notes (Signed)
[facility] DEPT Provider Note   CSN: [id number] Arrival date & time: 04/20/16  1130     History   Chief Complaint No chief complaint on file.   HPI Larry [name] is a 75 y.o. male.  HPI Patient presents with episode of near-syncope while getting blood drawn. Had been in the ER earlier dressed at [place]. Went up to the [facility] and began to feel bad during the drop. Was pale and diaphoretic. No chest pain. Systolic blood pressure was 60. Patient states he feels much better now. Has not had an episode like this before.   Past Medical History:  Diagnosis Date  . Chronic kidney disease    "I see [name]" (03/07/2016)  . GERD (gastroesophageal reflux disease)   . Gout   . History of kidney stones    "they passed" (03/07/2016)  . Hyperkalemia   . Hypertension   . Hypotestosteronism 03/29/2011  . Hypothyroidism   . Non Hodgkin's lymphoma ([place])   . Splenic marginal zone b-cell lymphoma ([place]) 03/29/2011  . Thyroid disease   . Type II diabetes mellitus ([place]) dx'd ~ 2005    Patient Active Problem List   Diagnosis Date Noted  . 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 Acute diastolic CHF (congestive heart failure) ([place]) 03/10/2016  . Cellulitis, neck 03/09/2016  . Acute congestive heart failure ([place])   . Pressure injury of skin 03/08/2016  . Acute renal failure superimposed on stage 4 chronic kidney disease ([place]) 03/08/2016  . CHF (congestive heart failure) ([place]) 03/07/2016  . Elevated troponin 03/07/2016  . Acute kidney injury superimposed on CKD ([place]) 03/07/2016  . Fever 03/07/2016  . Urinary retention s/p foley x2 08/19/2011  . Acute renal failure ([place]) 08/19/2011  . Chronic renal insufficiency, stage II (mild) 08/19/2011  . Duodenal ulcer, perforated, s/p Omental patch 12April2013 08/11/2011  . Splenic marginal zone b-cell lymphoma ([place]) 03/29/2011  . Hypotestosteronism 03/29/2011  . Diabetes  mellitus ([place]) 08/28/2007  . Essential hypertension 08/28/2007  . ALLERGIC RHINITIS 08/28/2007  . PLEURAL EFFUSION, LEFT 08/28/2007    Past Surgical History:  Procedure Laterality Date  . APPENDECTOMY  1948  . INGUINAL HERNIA REPAIR Right ~ 1948  . LAPAROTOMY  08/11/2011   Procedure: EXPLORATORY LAPAROTOMY;  Surgeon: Zenovia Jarred, MD;  Location: [facility];  Service: General;  Laterality: N/A;  . PANCREATECTOMY  2009   Partial w/ splenectomy  . SPLENECTOMY, TOTAL  2009  . TONSILLECTOMY         Home Medications    Prior to Admission medications   Medication Sig Start Date End Date Taking? Authorizing Provider  allopurinol (ZYLOPRIM) 100 MG tablet Take 100 mg by mouth daily.  10/20/14  Yes Historical Provider, MD  amLODipine (NORVASC) 10 MG tablet Take 10 mg by mouth daily.     Yes Historical Provider, MD  aspirin EC 81 MG EC tablet Take 1 tablet (81 mg total) by mouth daily. 03/13/16  Yes Orson Eva, MD  carvedilol (COREG) 3.125 MG tablet Take 1 tablet (3.125 mg total) by mouth 2 (two) times daily with a meal. 03/12/16  Yes Orson Eva, MD  doxazosin (CARDURA) 4 MG tablet Take 8 mg by mouth 2 (two) times daily. 2 tabs twice daily 10/20/14  Yes Historical Provider, MD  furosemide (LASIX) 40 MG tablet Take 160 mg by mouth 2 (two) times daily. 02/23/16  Yes Historical Provider, MD  furosemide (LASIX) 80 MG tablet Take 2  tablets (160 mg total) by mouth 2 (two) times daily. 03/12/16  Yes David Tat, MD  JANUVIA 100 MG tablet Take 0.25 Doses by mouth 1 day or 1 dose. 02/23/16  Yes Historical Provider, MD  levothyroxine (SYNTHROID, LEVOTHROID) 175 MCG tablet Take 175 mcg by mouth daily before breakfast.   Yes Historical Provider, MD  omeprazole (PRILOSEC) 20 MG capsule Take 20 mg by mouth 2 (two) times daily.  11/27/11  Yes Historical Provider, MD  sitaGLIPtin (JANUVIA) 25 MG tablet Take 1 tablet (25 mg total) by mouth daily. 03/12/16  Yes Orson Eva, MD  triamcinolone ointment (KENALOG) 0.1 %  Apply 1 application topically daily as needed (for irritation).  05/09/15  Yes Historical Provider, MD    Family History No family history on file.  Social History Social History  Substance Use Topics  . Smoking status: Never Smoker  . Smokeless tobacco: Never Used  . Alcohol use No     Allergies   Iohexol   Review of Systems Review of Systems  Constitutional: Negative for appetite change.  Eyes: Negative for visual disturbance.  Respiratory: Negative for chest tightness.   Cardiovascular: Negative for chest pain.  Gastrointestinal: Negative for abdominal pain.  Genitourinary: Negative for difficulty urinating.  Musculoskeletal: Negative for back pain.  Neurological: Positive for light-headedness.  Psychiatric/Behavioral: Negative for behavioral problems.     Physical Exam Updated Vital Signs BP 167/62   Pulse (!) 58   Temp 97.6 F (36.4 C) (Oral)   Resp 18   Ht 5\' 8"  (1.727 m)   Wt 157 lb (71.2 kg)   SpO2 99%   BMI 23.87 kg/m   Physical Exam  Constitutional: He appears well-developed.  HENT:  Head: Atraumatic.  Neck: Neck supple.  Cardiovascular: Normal rate.   Pulmonary/Chest: Effort normal.  Abdominal: Soft. There is no tenderness.  Musculoskeletal: He exhibits no edema.  Neurological: He is alert.  Skin: Skin is warm.     ED Treatments / Results  Labs (all labs ordered are listed, but only abnormal results are displayed) Labs Reviewed  TROPONIN I - Abnormal; Notable for the following:       Result Value   Troponin I 0.04 (*)    All other components within normal limits    EKG  EKG Interpretation  Date/Time:  Thursday April 20 2016 11:36:45 EST Ventricular Rate:  55 PR Interval:    QRS Duration: 87 QT Interval:  450 QTC Calculation: 431 R Axis:   21 Text Interpretation:  Sinus or ectopic atrial rhythm RSR' in V1 or V2, right VCD or RVH Confirmed by Alvino Chapel  MD, Ovid Curd [phone]) on 04/21/2016 3:39:39 PM       Radiology No  results found.  Procedures Procedures (including critical care time)  Medications Ordered in ED Medications  sodium chloride 0.9 % bolus 500 mL (0 mLs Intravenous Stopped 04/20/16 1237)     Initial Impression / Assessment and Plan / ED Course  I have reviewed the triage vital signs and the nursing notes.  Pertinent labs & imaging results that were available during my care of the patient were reviewed by me and considered in my medical decision making (see chart for details).  Clinical Course     Patient with syncopal episode. Labs reassuring. Likely vasovagal in that it just started with blood draw. Will discharge home. Patient feels better after IV fluids. Troponin is stable at 0.04  Final Clinical Impressions(s) / ED Diagnoses   Final diagnoses:  Vasovagal episode  New Prescriptions Discharge Medication List as of 04/20/2016 12:35 PM       Davonna Belling, MD 04/21/16 1540

## 2016-04-27 DIAGNOSIS — N184 Chronic kidney disease, stage 4 (severe): Secondary | ICD-10-CM | POA: Diagnosis not present

## 2016-04-27 DIAGNOSIS — L03221 Cellulitis of neck: Secondary | ICD-10-CM | POA: Diagnosis not present

## 2016-04-27 DIAGNOSIS — E1122 Type 2 diabetes mellitus with diabetic chronic kidney disease: Secondary | ICD-10-CM | POA: Diagnosis not present

## 2016-04-27 DIAGNOSIS — I5031 Acute diastolic (congestive) heart failure: Secondary | ICD-10-CM | POA: Diagnosis not present

## 2016-04-27 DIAGNOSIS — L89322 Pressure ulcer of left buttock, stage 2: Secondary | ICD-10-CM | POA: Diagnosis not present

## 2016-04-27 DIAGNOSIS — I130 Hypertensive heart and chronic kidney disease with heart failure and stage 1 through stage 4 chronic kidney disease, or unspecified chronic kidney disease: Secondary | ICD-10-CM | POA: Diagnosis not present

## 2016-05-02 ENCOUNTER — Other Ambulatory Visit: Payer: Self-pay

## 2016-05-02 DIAGNOSIS — R55 Syncope and collapse: Secondary | ICD-10-CM | POA: Diagnosis not present

## 2016-05-02 NOTE — Patient Outreach (Signed)
[facility]) Care Management  05/02/2016  Larry [name] 12-11-1940 [id number]   [name]  Insurance:  Medicare and supplemental Medcore, working on getting services with the [place], [place].   Subjective: [place] [id number] [phone] (M  Providers: PCP:  Dr. Aura Dials. [facility] at [place]  [id number]  last appt:  05/03/2015 [facility]  First appt established with a PA on 04/20/16  [name], [place] Nephrologist:  Dr. Florene Glen 03/2016 Cardiologist:  Update:  Initial appt scheduled for 05/08/16 at [place]:  Next appt  05/17/16  Co-morbidities: HTN, CHF, DM, chronic renal insufficiency (stage II mild).  Admission:  03/07/2016 - 123456 Acute diastolic CHF Patient is weighing daily.   BP 128/61 03/21/2016 Weight 150 lb (68 kg) 03/31/2016  (04/05/16 home scale weight 151.6) Height 68 in (173 cm) 03/07/2016 BMI 22.81 (Normal) 03/31/2016  BP 128/61 04/05/2016 Weight 152 lb (69 kg) 04/06/2016 Height 68 in (173 cm) 04/05/2016 BMI 23.05 (Normal) 04/06/2016  Lipid Panel N/D HDL N/D LDL N/D Cholesterol, total N/D Triglycerides N/D A1C 5.600 03/08/2016 Glucose Random 209.000 03/21/2016  Medications:  Prevnar (PCV13) N/D Pneumovax (PP 03/10/2014 Flu Vaccine 01/12/2016 tDAP Vaccine 06/28/2015  Encounter Medications:   Dr. Florene Glen has changed Lasix to 80 mt tab 2 tabs every morning and 1 tab every evening.  Outpatient Encounter Prescriptions as of 05/02/2016  Medication Sig Note  . allopurinol (ZYLOPRIM) 100 MG tablet Take 100 mg by mouth daily.    Marland Kitchen amLODipine (NORVASC) 10 MG tablet Take 10 mg by mouth daily.     Marland Kitchen aspirin EC 81 MG EC tablet Take 1 tablet (81 mg total) by mouth daily.   . carvedilol (COREG) 3.125 MG tablet Take 1 tablet (3.125 mg total) by mouth 2 (two) times daily with a meal.   . doxazosin (CARDURA) 4 MG tablet Take 8 mg by mouth 2 (two)  times daily. 2 tabs twice daily 06/28/2015: 4 tabs daily  . furosemide (LASIX) 40 MG tablet Take 160 mg by mouth 2 (two) times daily. 03/21/2016: Received from: External Pharmacy  . furosemide (LASIX) 80 MG tablet Take 2 tablets (160 mg total) by mouth 2 (two) times daily. 04/20/2016: Taking 2 tablets morning and 1 tablet night.   Marland Kitchen JANUVIA 100 MG tablet Take 0.25 Doses by mouth 1 day or 1 dose. 03/21/2016: Received from: External Pharmacy  . levothyroxine (SYNTHROID, LEVOTHROID) 175 MCG tablet Take 175 mcg by mouth daily before breakfast.   . omeprazole (PRILOSEC) 20 MG capsule Take 20 mg by mouth 2 (two) times daily.    . sitaGLIPtin (JANUVIA) 25 MG tablet Take 1 tablet (25 mg total) by mouth daily.   Marland Kitchen triamcinolone ointment (KENALOG) 0.1 % Apply 1 application topically daily as needed (for irritation).  05/18/2015: Received from: External Pharmacy   No facility-administered encounter medications on file as of 05/02/2016.     Functional Status:  In your present state of health, do you have any difficulty performing the following activities: 04/20/2016 03/07/2016  Hearing? N N  Vision? N N  Difficulty concentrating or making decisions? N N  Walking or climbing stairs? N Y  Dressing or bathing? N N  Doing errands, shopping? N N  Preparing Food and eating ? N -  Using the Toilet? N -  In the past six months, have you accidently leaked urine? N -  Do you have problems with loss of bowel control? N -  Managing your Medications? N -  Managing your Finances? N -  Housekeeping or managing your Housekeeping? N -  Some recent data might be hidden    Fall/Depression Screening: PHQ 2/9 Scores 04/20/2016 04/20/2016 03/16/2016 07/15/2013  PHQ - 2 Score 0 0 0 0    Assessment / Plan: Telephonic RN CM services 04/19/16 Program:  HF 04/19/16 - 05/02/2016 Screening and Initial Assessment:  04/19/16 - 05/02/2016 Referral to   CHF RN CM encouraged to notify MD of any weight gain.  RN CM reviewed signs  and symptoms of HF and when to contact MD.  RN CM encouraged to remain adherent to medication regime and all appt's; call MD with any changes in condition.   Advance Directives:   RN CM will continue to provide education on the importance of having Advance Directive / HCPOA established.   [facility] Education (mailed 04/20/2016 - unable to review on 05/02/16 contact call - patient at MD appt.  RN CM will review on next monthly scheduled complex CM contact call.) -Heart Failure-Home Monitoring  -Heart Failure:  Keeping Track Of Your Weight Each Day -Heart Failure:  How To Be Salt Smart -Heart Failure:  Understanding Water Pills and Thirst -Heart Failure - Tests -Heart Failure:  When To Call Your Doctor Or 911  -Heart Failure:  Heart Zone Action Magnet  RN CM advised in next [facility] scheduled contact call within next 30 days for monthly assessment and / or care coordination services as needed.  RN CM advised to please notify MD of any changes in condition prior to scheduled appt's.   RN CM provided contact name and # [phone] or main office # [phone] and 24-hour nurse line # [phone].  RN CM confirmed patient is aware of 911 services for urgent emergency needs.  [name], BSN, RN, [place] Care Management Care Management Coordinator [phone] Direct [phone] Cell [phone] Office [phone] Fax [email or web address]

## 2016-05-03 DIAGNOSIS — R42 Dizziness and giddiness: Secondary | ICD-10-CM | POA: Diagnosis not present

## 2016-05-03 DIAGNOSIS — R404 Transient alteration of awareness: Secondary | ICD-10-CM | POA: Diagnosis not present

## 2016-05-08 DIAGNOSIS — R55 Syncope and collapse: Secondary | ICD-10-CM | POA: Diagnosis not present

## 2016-05-08 DIAGNOSIS — I5032 Chronic diastolic (congestive) heart failure: Secondary | ICD-10-CM | POA: Diagnosis not present

## 2016-05-08 DIAGNOSIS — I10 Essential (primary) hypertension: Secondary | ICD-10-CM | POA: Diagnosis not present

## 2016-05-08 DIAGNOSIS — N184 Chronic kidney disease, stage 4 (severe): Secondary | ICD-10-CM | POA: Diagnosis not present

## 2016-05-09 DIAGNOSIS — E118 Type 2 diabetes mellitus with unspecified complications: Secondary | ICD-10-CM | POA: Diagnosis not present

## 2016-05-10 DIAGNOSIS — R0989 Other specified symptoms and signs involving the circulatory and respiratory systems: Secondary | ICD-10-CM | POA: Diagnosis not present

## 2016-05-11 DIAGNOSIS — I5031 Acute diastolic (congestive) heart failure: Secondary | ICD-10-CM | POA: Diagnosis not present

## 2016-05-11 DIAGNOSIS — I130 Hypertensive heart and chronic kidney disease with heart failure and stage 1 through stage 4 chronic kidney disease, or unspecified chronic kidney disease: Secondary | ICD-10-CM | POA: Diagnosis not present

## 2016-05-11 DIAGNOSIS — E1122 Type 2 diabetes mellitus with diabetic chronic kidney disease: Secondary | ICD-10-CM | POA: Diagnosis not present

## 2016-05-11 DIAGNOSIS — L03221 Cellulitis of neck: Secondary | ICD-10-CM | POA: Diagnosis not present

## 2016-05-11 DIAGNOSIS — L89322 Pressure ulcer of left buttock, stage 2: Secondary | ICD-10-CM | POA: Diagnosis not present

## 2016-05-11 DIAGNOSIS — N184 Chronic kidney disease, stage 4 (severe): Secondary | ICD-10-CM | POA: Diagnosis not present

## 2016-05-15 DIAGNOSIS — I10 Essential (primary) hypertension: Secondary | ICD-10-CM | POA: Diagnosis not present

## 2016-05-15 DIAGNOSIS — I429 Cardiomyopathy, unspecified: Secondary | ICD-10-CM | POA: Diagnosis not present

## 2016-05-15 DIAGNOSIS — E119 Type 2 diabetes mellitus without complications: Secondary | ICD-10-CM | POA: Diagnosis not present

## 2016-05-17 ENCOUNTER — Other Ambulatory Visit: Payer: Self-pay

## 2016-05-17 NOTE — Patient Outreach (Addendum)
[facility]) Care Management  05/17/2016  Larry [name] 15-Feb-1941 [id number]   [facility] Heart Failure Program  Insurance:  Medicare and supplemental Medcore  Subjective: Larry [name] [phone] (M  Providers: PCP:  Dr. Aura Dials. [facility] at [place]  [id number]  last appt:  05/02/2016 [facility], [place], [place]:  First appt established with a PA on 04/20/16.   Next appt 05/31/16 Nephrologist:  Dr. Florene Glen:  03/2016 Cardiologist, [place]:  05/08/16  Podiatrist:  05/17/16  Co-morbidities: HTN, CHF, DM, chronic renal insufficiency (stage II mild).  Admission:  03/07/2016 - 123456 Acute diastolic CHF Patient is weighing daily. Denies any issues with swelling in legs, feet, hands or abdomen.   BP 128/61 03/21/2016 Weight 150 lb (68 kg) 03/31/2016  (04/05/16 home scale weight 151.6) Height 68 in (173 cm) 03/07/2016 BMI 22.81 (Normal) 03/31/2016  BP 128/61 04/05/2016 Weight 152 lb (69 kg) 04/06/2016 Height 68 in (173 cm) 04/05/2016 BMI 23.05 (Normal) 04/06/2016  Medications:  Prevnar (PCV13) N/D Pneumovax (PP 03/10/2014 Flu Vaccine 01/12/2016 tDAP Vaccine 06/28/2015  Dr. Florene Glen changed Lasix to 80 mt tab 2 tabs every morning and 1 tab every evening.  Outpatient Encounter Prescriptions as of 05/17/2016  Medication Sig Note  . allopurinol (ZYLOPRIM) 100 MG tablet Take 100 mg by mouth daily.    Marland Kitchen amLODipine (NORVASC) 10 MG tablet Take 10 mg by mouth daily.     Marland Kitchen aspirin EC 81 MG EC tablet Take 1 tablet (81 mg total) by mouth daily.   . carvedilol (COREG) 3.125 MG tablet Take 1 tablet (3.125 mg total) by mouth 2 (two) times daily with a meal.   . doxazosin (CARDURA) 4 MG tablet Take 8 mg by mouth 2 (two) times daily. 2 tabs twice daily 06/28/2015: 4 tabs daily  . furosemide (LASIX) 40 MG tablet Take 160 mg by mouth 2 (two) times daily. 03/21/2016: Received from: External Pharmacy  . furosemide (LASIX)  80 MG tablet Take 2 tablets (160 mg total) by mouth 2 (two) times daily. 04/20/2016: Taking 2 tablets morning and 1 tablet night.   Marland Kitchen JANUVIA 100 MG tablet Take 0.25 Doses by mouth 1 day or 1 dose. 03/21/2016: Received from: External Pharmacy  . levothyroxine (SYNTHROID, LEVOTHROID) 175 MCG tablet Take 175 mcg by mouth daily before breakfast.   . omeprazole (PRILOSEC) 20 MG capsule Take 20 mg by mouth 2 (two) times daily.    . sitaGLIPtin (JANUVIA) 25 MG tablet Take 1 tablet (25 mg total) by mouth daily.   Marland Kitchen triamcinolone ointment (KENALOG) 0.1 % Apply 1 application topically daily as needed (for irritation).  05/18/2015: Received from: External Pharmacy   No facility-administered encounter medications on file as of 05/17/2016.     Functional Status:  In your present state of health, do you have any difficulty performing the following activities: 04/20/2016 03/07/2016  Hearing? N N  Vision? N N  Difficulty concentrating or making decisions? N N  Walking or climbing stairs? N Y  Dressing or bathing? N N  Doing errands, shopping? N N  Preparing Food and eating ? N -  Using the Toilet? N -  In the past six months, have you accidently leaked urine? N -  Do you have problems with loss of bowel control? N -  Managing your Medications? N -  Managing your Finances? N -  Housekeeping or managing your Housekeeping? N -  Some recent data might be hidden    Assessment /  Plan: Telephonic RN CM services 04/19/16 Program:   -[facility]i HF 04/19/16 - 05/02/2016 -HF 04/19/16 Screening and Initial Assessment:  04/19/16  CHF RN CM encouraged to notify MD of any weight gain.  RN CM reviewed signs and symptoms of HF and when to contact MD.  RN CM encouraged to remain adherent to medication regime and all appt's; call MD with any changes in condition.  [facility] with [facility] and request BP cuff from [place] on next appt.   Advance Directives:   RN CM will continue to provide education on the importance of  having Advance Directive and  HCPOA established.   [facility] Education (mailed 04/20/2016 - reviewed 05/17/16) -Heart Failure-Home Monitoring  -Heart Failure:  Keeping Track Of Your Weight Each Day -Heart Failure:  How To Be Salt Smart -Heart Failure:  Understanding Water Pills and Thirst -Heart Failure - Tests -Heart Failure:  When To Call Your Doctor Or 911  -Heart Failure:  Heart Zone Action Magnet  RN CM advised in next [facility] scheduled contact call within next 30 days for monthly assessment and / or care coordination services as needed.  RN CM advised to please notify MD of any changes in condition prior to scheduled appt's.   RN CM provided contact name and # [phone] or main office # [phone] and 24-hour nurse line # [phone].  RN CM confirmed patient is aware of 911 services for urgent emergency needs.  [name], BSN, RN, [place] Care Management Care Management Coordinator [phone] Direct [phone] Cell [phone] Office [phone] Fax [email or web address]

## 2016-05-22 DIAGNOSIS — R55 Syncope and collapse: Secondary | ICD-10-CM | POA: Diagnosis not present

## 2016-05-22 DIAGNOSIS — E1122 Type 2 diabetes mellitus with diabetic chronic kidney disease: Secondary | ICD-10-CM | POA: Diagnosis not present

## 2016-05-22 DIAGNOSIS — N184 Chronic kidney disease, stage 4 (severe): Secondary | ICD-10-CM | POA: Diagnosis not present

## 2016-05-22 DIAGNOSIS — I10 Essential (primary) hypertension: Secondary | ICD-10-CM | POA: Diagnosis not present

## 2016-05-26 ENCOUNTER — Encounter: Payer: Self-pay | Admitting: Hematology & Oncology

## 2016-05-26 ENCOUNTER — Ambulatory Visit (HOSPITAL_BASED_OUTPATIENT_CLINIC_OR_DEPARTMENT_OTHER): Payer: Medicare Other | Admitting: Family

## 2016-05-26 ENCOUNTER — Ambulatory Visit (HOSPITAL_BASED_OUTPATIENT_CLINIC_OR_DEPARTMENT_OTHER): Payer: Medicare Other

## 2016-05-26 ENCOUNTER — Other Ambulatory Visit (HOSPITAL_BASED_OUTPATIENT_CLINIC_OR_DEPARTMENT_OTHER): Payer: Medicare Other

## 2016-05-26 VITALS — BP 169/60 | HR 98 | Temp 97.8°F | Resp 18 | Wt 167.8 lb

## 2016-05-26 DIAGNOSIS — N184 Chronic kidney disease, stage 4 (severe): Secondary | ICD-10-CM

## 2016-05-26 DIAGNOSIS — C8307 Small cell B-cell lymphoma, spleen: Secondary | ICD-10-CM

## 2016-05-26 DIAGNOSIS — E291 Testicular hypofunction: Secondary | ICD-10-CM

## 2016-05-26 DIAGNOSIS — Z9081 Acquired absence of spleen: Secondary | ICD-10-CM

## 2016-05-26 DIAGNOSIS — I6521 Occlusion and stenosis of right carotid artery: Secondary | ICD-10-CM

## 2016-05-26 DIAGNOSIS — Z8572 Personal history of non-Hodgkin lymphomas: Secondary | ICD-10-CM

## 2016-05-26 DIAGNOSIS — D631 Anemia in chronic kidney disease: Secondary | ICD-10-CM

## 2016-05-26 DIAGNOSIS — E349 Endocrine disorder, unspecified: Secondary | ICD-10-CM

## 2016-05-26 DIAGNOSIS — R740 Nonspecific elevation of levels of transaminase and lactic acid dehydrogenase [LDH]: Secondary | ICD-10-CM

## 2016-05-26 LAB — CBC WITH DIFFERENTIAL (CANCER CENTER ONLY)
BASO#: 0 10*3/uL (ref 0.0–0.2)
BASO%: 0.4 % (ref 0.0–2.0)
EOS%: 9.3 % — AB (ref 0.0–7.0)
Eosinophils Absolute: 0.8 10*3/uL — ABNORMAL HIGH (ref 0.0–0.5)
HCT: 30 % — ABNORMAL LOW (ref 38.7–49.9)
HEMOGLOBIN: 10.1 g/dL — AB (ref 13.0–17.1)
LYMPH#: 1.5 10*3/uL (ref 0.9–3.3)
LYMPH%: 17.6 % (ref 14.0–48.0)
MCH: 34.6 pg — ABNORMAL HIGH (ref 28.0–33.4)
MCHC: 33.7 g/dL (ref 32.0–35.9)
MCV: 103 fL — ABNORMAL HIGH (ref 82–98)
MONO#: 1.1 10*3/uL — ABNORMAL HIGH (ref 0.1–0.9)
MONO%: 13.1 % — AB (ref 0.0–13.0)
NEUT%: 59.6 % (ref 40.0–80.0)
NEUTROS ABS: 5 10*3/uL (ref 1.5–6.5)
Platelets: 205 10*3/uL (ref 145–400)
RBC: 2.92 10*6/uL — AB (ref 4.20–5.70)
RDW: 13.8 % (ref 11.1–15.7)
WBC: 8.4 10*3/uL (ref 4.0–10.0)

## 2016-05-26 LAB — CMP (CANCER CENTER ONLY)
ALBUMIN: 3.4 g/dL (ref 3.3–5.5)
ALT(SGPT): 25 U/L (ref 10–47)
AST: 24 U/L (ref 11–38)
Alkaline Phosphatase: 78 U/L (ref 26–84)
BUN, Bld: 86 mg/dL — ABNORMAL HIGH (ref 7–22)
CO2: 24 mEq/L (ref 18–33)
CREATININE: 3.1 mg/dL — AB (ref 0.6–1.2)
Calcium: 9.2 mg/dL (ref 8.0–10.3)
Chloride: 103 mEq/L (ref 98–108)
Glucose, Bld: 196 mg/dL — ABNORMAL HIGH (ref 73–118)
Potassium: 4.6 mEq/L (ref 3.3–4.7)
SODIUM: 138 meq/L (ref 128–145)
TOTAL PROTEIN: 6.7 g/dL (ref 6.4–8.1)
Total Bilirubin: 0.8 mg/dl (ref 0.20–1.60)

## 2016-05-26 MED ORDER — TESTOSTERONE CYPIONATE 200 MG/ML IM SOLN
INTRAMUSCULAR | Status: AC
  Filled 2016-05-26: qty 2

## 2016-05-26 MED ORDER — TESTOSTERONE CYPIONATE 200 MG/ML IM SOLN
400.0000 mg | INTRAMUSCULAR | Status: DC
  Administered 2016-05-26: 400 mg via INTRAMUSCULAR

## 2016-05-26 NOTE — Patient Instructions (Signed)
Testosterone injection What is this medicine? TESTOSTERONE (tes TOS ter one) is the main male hormone. It supports normal male development such as muscle growth, facial hair, and deep voice. It is used in males to treat low testosterone levels. This medicine may be used for other purposes; ask your health care provider or pharmacist if you have questions. COMMON BRAND NAME(S): Andro-L.A., Aveed, Delatestryl, Depo-Testosterone, Virilon What should I tell my health care provider before I take this medicine? They need to know if you have any of these conditions: -cancer -diabetes -heart disease -kidney disease -liver disease -lung disease -prostate disease -an unusual or allergic reaction to testosterone, other medicines, foods, dyes, or preservatives -pregnant or trying to get pregnant -breast-feeding How should I use this medicine? This medicine is for injection into a muscle. It is usually given by a health care professional in a hospital or clinic setting. Contact your pediatrician regarding the use of this medicine in children. While this medicine may be prescribed for children as young as 12 years of age for selected conditions, precautions do apply. Overdosage: If you think you have taken too much of this medicine contact a poison control center or emergency room at once. NOTE: This medicine is only for you. Do not share this medicine with others. What if I miss a dose? Try not to miss a dose. Your doctor or health care professional will tell you when your next injection is due. Notify the office if you are unable to keep an appointment. What may interact with this medicine? -medicines for diabetes -medicines that treat or prevent blood clots like warfarin -oxyphenbutazone -propranolol -steroid medicines like prednisone or cortisone This list may not describe all possible interactions. Give your health care provider a list of all the medicines, herbs, non-prescription drugs, or  dietary supplements you use. Also tell them if you smoke, drink alcohol, or use illegal drugs. Some items may interact with your medicine. What should I watch for while using this medicine? Visit your doctor or health care professional for regular checks on your progress. They will need to check the level of testosterone in your blood. This medicine is only approved for use in men who have low levels of testosterone related to certain medical conditions. Heart attacks and strokes have been reported with the use of this medicine. Notify your doctor or health care professional and seek emergency treatment if you develop breathing problems; changes in vision; confusion; chest pain or chest tightness; sudden arm pain; severe, sudden headache; trouble speaking or understanding; sudden numbness or weakness of the face, arm or leg; loss of balance or coordination. Talk to your doctor about the risks and benefits of this medicine. This medicine may affect blood sugar levels. If you have diabetes, check with your doctor or health care professional before you change your diet or the dose of your diabetic medicine. Testosterone injections are not commonly used in women. Women should inform their doctor if they wish to become pregnant or think they might be pregnant. There is a potential for serious side effects to an unborn child. Talk to your health care professional or pharmacist for more information. Talk with your doctor or health care professional about your birth control options while taking this medicine. This drug is banned from use in athletes by most athletic organizations. What side effects may I notice from receiving this medicine? Side effects that you should report to your doctor or health care professional as soon as possible: -allergic reactions like skin rash,   itching or hives, swelling of the face, lips, or tongue -breast enlargement -breathing problems -changes in emotions or moods -deep or  hoarse voice -irregular menstrual periods -signs and symptoms of liver injury like dark yellow or brown urine; general ill feeling or flu-like symptoms; light-colored stools; loss of appetite; nausea; right upper belly pain; unusually weak or tired; yellowing of the eyes or skin -stomach pain -swelling of the ankles, feet, hands -too frequent or persistent erections -trouble passing urine or change in the amount of urine Side effects that usually do not require medical attention (report to your doctor or health care professional if they continue or are bothersome): -acne -change in sex drive or performance -facial hair growth -hair loss -headache This list may not describe all possible side effects. Call your doctor for medical advice about side effects. You may report side effects to FDA at 1-800-FDA-1088. Where should I keep my medicine? Keep out of the reach of children. This medicine can be abused. Keep your medicine in a safe place to protect it from theft. Do not share this medicine with anyone. Selling or giving away this medicine is dangerous and against the law. Store at room temperature between 20 and 25 degrees C (68 and 77 degrees F). Do not freeze. Protect from light. Follow the directions for the product you are prescribed. Throw away any unused medicine after the expiration date. NOTE: This sheet is a summary. It may not cover all possible information. If you have questions about this medicine, talk to your doctor, pharmacist, or health care provider.  2017 Elsevier/Gold Standard (2015-05-22 07:33:55)  

## 2016-05-26 NOTE — Progress Notes (Signed)
Hematology and Oncology Follow Up Visit  Larry [name]:[id number] [birth year] 76 y.o. 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 Hypotestosteronemia  3. Chronic renal insufficiency stage 4  Current Therapy:   Testosterone 400 mg IM once a month    Interim History: Larry [name] is here today for a follow-up. He has had several other syncopal episodes since we last saw him. He states that one episode was actually during his recent stress test. He states that he is followed closely by cardiology and that her had a recent carotid duplex which revealed around 69% blockage of the right side. His cholesterol was up and he has since started daily Lipitor. He will continue to be followed by cardiology and states that they plan to repeat another carotid duplex in a few months.  He has occasional weakness and has to sit down to rest.   No episodes of bleeding or bruising. No lymphadenopathy found on exam.  No fever, chills, n/v, cough, rash, headache, blurred vision, SOB, chest pain, palpitations, abdominal pain or changes in bowel or bladder habits.  No swelling or tenderness in his extremities. 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 No c/o joint aches or pain.  He has maintained a good appetite and is staying well hydrated. His weight is up 10 lbs since his last visit.  His states that his blood sugars have been fairly well controlled.  He follows up with his doctors with the [facility] on January 31st.   Medications:  Allergies as of 05/26/2016      Reactions   Iohexol Other (See Comments)   Pt states has history of reaction in the past, can not remember what exactly happened, but was told by the dr never to have the contrast again.      Medication List       Accurate as of 05/26/16 11:51 AM. Always use your most recent med list.          allopurinol 100 MG tablet Commonly known as:  ZYLOPRIM Take 100  mg by mouth daily.   aspirin 81 MG EC tablet Take 1 tablet (81 mg total) by mouth daily.   atorvastatin 10 MG tablet Commonly known as:  LIPITOR Take 10 mg by mouth daily.   carvedilol 3.125 MG tablet Commonly known as:  COREG Take 1 tablet (3.125 mg total) by mouth 2 (two) times daily with a meal.   doxazosin 4 MG tablet Commonly known as:  CARDURA Take 8 mg by mouth 2 (two) times daily. 2 tabs twice daily   furosemide 40 MG tablet Commonly known as:  LASIX Take 160 mg by mouth 2 (two) times daily.   furosemide 80 MG tablet Commonly known as:  LASIX Take 2 tablets (160 mg total) by mouth 2 (two) times daily.   levothyroxine 175 MCG tablet Commonly known as:  SYNTHROID, LEVOTHROID Take 175 mcg by mouth daily before breakfast.   omeprazole 20 MG capsule Commonly known as:  PRILOSEC Take 20 mg by mouth 2 (two) times daily.   pindolol 10 MG tablet Commonly known as:  VISKEN Take 10 mg by mouth daily.   JANUVIA 100 MG tablet Generic drug:  sitaGLIPtin Take 0.25 Doses by mouth 1 day or 1 dose.   sitaGLIPtin 25 MG tablet Commonly known as:  JANUVIA Take 1 tablet (25 mg total) by mouth daily.   triamcinolone ointment 0.1 % Commonly  known as:  KENALOG Apply 1 application topically daily as needed (for irritation).       Allergies:  Allergies  Allergen Reactions  . Iohexol Other (See Comments)    Pt states has history of reaction in the past, can not remember what exactly happened, but was told by the dr never to have the contrast again.    Past Medical History, Surgical history, Social history, and Family History were reviewed and updated.  Review of Systems: All other 10 point review of systems is negative.   Physical Exam:  weight is 167 lb 12.8 [name] (76.1 kg). His oral temperature is 97.8 F (36.6 C). His blood pressure is 169/60 (abnormal) and his pulse is 98. His respiration is 18 and oxygen saturation is 96%.   Wt Readings from Last 3 Encounters:    05/26/16 167 lb 12.8 [name] (76.1 kg)  04/20/16 157 lb (71.2 kg)  04/06/16 151 lb 9.6 [name] (68.8 kg)    Ocular: Sclerae unicteric, pupils equal, round and reactive to light Ear-nose-throat: Oropharynx clear, dentition fair Lymphatic: No cervical supraclavicular or axillary adenopathy Lungs no rales or rhonchi, good excursion bilaterally Heart regular rate and rhythm, no murmur appreciated Abd soft, nontender, positive bowel sounds, no liver tip palpated on exam, no fluid wave MSK no focal spinal tenderness, no joint edema Neuro: non-focal, well-oriented, appropriate affect  Lab Results  Component Value Date   WBC 8.4 05/26/2016   HGB 10.1 (L) 05/26/2016   HCT 30.0 (L) 05/26/2016   MCV 103 (H) 05/26/2016   PLT 205 05/26/2016   Lab Results  Component Value Date   FERRITIN 249 04/20/2016   IRON 124 04/20/2016   TIBC 235 04/20/2016   UIBC 111 (L) 04/20/2016   IRONPCTSAT 53 04/20/2016   Lab Results  Component Value Date   RETICCTPCT 0.9 03/29/2011   RBC 2.92 (L) 05/26/2016   RETICCTABS 31.8 03/29/2011   No results found for: KPAFRELGTCHN, LAMBDASER, KAPLAMBRATIO No results found for: Kandis Cocking, [facility] Lab Results  Component Value Date   TOTALPROTELP 6.7 07/14/2011   ALBUMINELP 56.6 07/14/2011   A1GS 5.7 (H) 07/14/2011   A2GS 12.7 (H) 07/14/2011   BETS 4.9 07/14/2011   BETA2SER 3.8 07/14/2011   GAMS 16.3 07/14/2011   MSPIKE NOT DET 07/14/2011   SPEI * 07/14/2011     Chemistry      Component Value Date/Time   NA 140 04/20/2016 1059   NA 142 02/15/2016 0953   K 4.8 No visible hemolysis (H) 04/20/2016 1059   K 4.4 02/15/2016 0953   CL 103 04/20/2016 1059   CO2 28 04/20/2016 1059   CO2 23 02/15/2016 0953   BUN 86 (H) 04/20/2016 1059   BUN 47.4 (H) 02/15/2016 0953   CREATININE 2.9 (H) 04/20/2016 1059   CREATININE 2.6 (H) 02/15/2016 0953      Component Value Date/Time   CALCIUM 9.1 04/20/2016 1059   CALCIUM 9.1 02/15/2016 0953   ALKPHOS 78 04/20/2016 1059    ALKPHOS 99 02/15/2016 0953   AST 29 04/20/2016 1059   AST 16 02/15/2016 0953   ALT 23 04/20/2016 1059   ALT 7 02/15/2016 0953   BILITOT 0.70 04/20/2016 1059   BILITOT 0.93 02/15/2016 0953     Impression and Plan: Larry [name] is a very pleasant 76 yo male with hypotestosteronemia. He also has a history of splenic marginal zone lymphoma. He had a splenectomy in 2009 and completed 1 cycle of Rituxan in April 2010. So far, there has been  no evidence of recurrence.  He has had more episodes of syncope, once during his stress test. He was found to have a 69% percent blockage of the right side on carotid duplex. His LDH was elevated at 113 and he is now on Lipitor. He continues to be followed closely by cardiology.  He will continue to receive testosterone injections monthly. His last level was 302. Today's level is pending.  He has stage 4 chronic renal insufficiency and some anemia related to this. His Hgb is 10.1 with an MCV of 103. We will continue to monitor this for now without intervention.  We will plan to see him back in 1 month for labs and follow-up. Both he and his wife know to contact our office with any questions or concerns. We can certainly see him sooner if need be.   Eliezer Bottom, NP 1/26/201811:51 AM

## 2016-05-27 LAB — TESTOSTERONE: Testosterone, Serum: 232 ng/dL — ABNORMAL LOW (ref 264–916)

## 2016-05-30 DIAGNOSIS — R55 Syncope and collapse: Secondary | ICD-10-CM | POA: Diagnosis not present

## 2016-05-30 DIAGNOSIS — I10 Essential (primary) hypertension: Secondary | ICD-10-CM | POA: Diagnosis not present

## 2016-05-30 DIAGNOSIS — I5032 Chronic diastolic (congestive) heart failure: Secondary | ICD-10-CM | POA: Diagnosis not present

## 2016-05-30 DIAGNOSIS — N184 Chronic kidney disease, stage 4 (severe): Secondary | ICD-10-CM | POA: Diagnosis not present

## 2016-06-13 DIAGNOSIS — N184 Chronic kidney disease, stage 4 (severe): Secondary | ICD-10-CM | POA: Diagnosis not present

## 2016-06-13 DIAGNOSIS — I10 Essential (primary) hypertension: Secondary | ICD-10-CM | POA: Diagnosis not present

## 2016-06-13 DIAGNOSIS — R600 Localized edema: Secondary | ICD-10-CM | POA: Diagnosis not present

## 2016-06-13 DIAGNOSIS — E213 Hyperparathyroidism, unspecified: Secondary | ICD-10-CM | POA: Diagnosis not present

## 2016-06-14 ENCOUNTER — Other Ambulatory Visit: Payer: Self-pay

## 2016-06-14 NOTE — Patient Outreach (Signed)
[facility]) Care Management  06/14/2016  Larry [name] [birth year] [id number]   [facility]  Insurance:  Medicare and supplemental [employer]  Subjective: [place] [id number] [phone] (M  Providers: PCP:  Dr. Aura Dials. [facility] [place]  [phone]  last appt:  05/02/2016 [facility], [address], [place]:  First appt established with a PA on 04/20/16.   Next appt 05/31/16 Nephrologist:  Dr. Florene Glen:   Last appt 06/13/16 Cardiologist, [place]:  05/08/16  Podiatrist:  05/17/16  Co-morbidities: HTN, CHF, DM, chronic renal insufficiency (stage II mild).  Admission:  03/07/2016 - 16/60/6004 Acute diastolic CHF Patient is weighing daily. Denies any issues with swelling in legs, feet, hands or abdomen.  Completed appt with Dr. Florene Glen yesterday and states no issues.    BP 169/60 05/26/2016  And morning home scale weight 164.2 on 06/14/16 Weight 168 lb (76 kg) 05/26/2016 Height 67 in (170 cm) 05/02/2016 BMI 25.51 (Overweight, Pre-obe 05/26/2016  Lipid Panel N/D HDL N/D LDL N/D Cholesterol, total N/D Triglycerides N/D A1C 5.600 03/08/2016 Glucose Random 196.000 05/26/2016  Medications:  Prevnar (PCV13) N/D Pneumovax (PP 03/10/2014 Flu Vaccine 01/12/2016 tDAP Vaccine 06/28/2015  Outpatient Encounter Prescriptions as of 06/14/2016  Medication Sig Note  . allopurinol (ZYLOPRIM) 100 MG tablet Take 100 mg by mouth daily.    Marland Kitchen aspirin EC 81 MG EC tablet Take 1 tablet (81 mg total) by mouth daily.   Marland Kitchen atorvastatin (LIPITOR) 10 MG tablet Take 10 mg by mouth daily.   . carvedilol (COREG) 3.125 MG tablet Take 1 tablet (3.125 mg total) by mouth 2 (two) times daily with a meal.   . doxazosin (CARDURA) 4 MG tablet Take 8 mg by mouth 2 (two) times daily. 2 tabs twice daily 06/28/2015: 4 tabs daily  . furosemide (LASIX) 40 MG tablet Take 160 mg by mouth 2 (two) times daily. 03/21/2016: Received from: External  Pharmacy  . furosemide (LASIX) 80 MG tablet Take 2 tablets (160 mg total) by mouth 2 (two) times daily. 04/20/2016: Taking 2 tablets morning and 1 tablet night.   Marland Kitchen JANUVIA 100 MG tablet Take 0.25 Doses by mouth 1 day or 1 dose. 03/21/2016: Received from: External Pharmacy  . levothyroxine (SYNTHROID, LEVOTHROID) 175 MCG tablet Take 175 mcg by mouth daily before breakfast.   . omeprazole (PRILOSEC) 20 MG capsule Take 20 mg by mouth 2 (two) times daily.    . pindolol (VISKEN) 10 MG tablet Take 10 mg by mouth daily.   . sitaGLIPtin (JANUVIA) 25 MG tablet Take 1 tablet (25 mg total) by mouth daily.   Marland Kitchen triamcinolone ointment (KENALOG) 0.1 % Apply 1 application topically daily as needed (for irritation).  05/18/2015: Received from: External Pharmacy   No facility-administered encounter medications on file as of 06/14/2016.     Functional Status:  In your present state of health, do you have any difficulty performing the following activities: 04/20/2016 03/07/2016  Hearing? N N  Vision? N N  Difficulty concentrating or making decisions? N N  Walking or climbing stairs? N Y  Dressing or bathing? N N  Doing errands, shopping? N N  Preparing Food and eating ? N -  Using the Toilet? N -  In the past six months, have you accidently leaked urine? N -  Do you have problems with loss of bowel control? N -  Managing your Medications? N -  Managing your Finances? N -  Housekeeping or managing your Housekeeping?  N -  Some recent data might be hidden    Assessment / Plan: Telephonic RN CM services 04/19/16 Program:   -[facility] HF 04/19/16 - 05/02/2016 -HF 04/19/16 - 06/14/2016 Screening and Initial Assessment:  04/19/16  CHF RN CM encouraged to notify MD of any weight gain.  RN CM reviewed signs and symptoms of HF and when to contact MD.  RN CM encouraged to remain adherent to medication regime and all appt's; call MD with any changes in condition.    [facility] Education (mailed 04/20/2016 - reviewed  05/17/16, 06/14/16) -Heart Failure-Home Monitoring  -Heart Failure:  Keeping Track Of Your Weight Each Day -Heart Failure:  How To Be Salt Smart -Heart Failure:  Understanding Water Pills and Thirst -Heart Failure - Tests -Heart Failure:  When To Call Your Doctor Or 911  -Heart Failure:  Heart Zone Action Magnet  RN CM advised goals of care met.  Patient states he has no other needs at this time and agrees to case closure.   RN CM advised to please notify MD of any changes in condition prior to scheduled appt's.   RN CM provided contact name and # [phone] or main office # [phone] and 24-hour nurse line # [phone].  RN CM confirmed patient is aware of 911 services for urgent emergency needs.  Case closure letter to PCP.  [facility] notified case closed.   [name], BSN, RN, [place] [facility] Care Management Coordinator [phone] Direct [phone] Cell [phone] Office [phone] Fax [email or web address]

## 2016-06-28 ENCOUNTER — Ambulatory Visit (HOSPITAL_BASED_OUTPATIENT_CLINIC_OR_DEPARTMENT_OTHER): Payer: Medicare Other | Admitting: Hematology & Oncology

## 2016-06-28 ENCOUNTER — Other Ambulatory Visit (HOSPITAL_BASED_OUTPATIENT_CLINIC_OR_DEPARTMENT_OTHER): Payer: Medicare Other

## 2016-06-28 ENCOUNTER — Ambulatory Visit (HOSPITAL_BASED_OUTPATIENT_CLINIC_OR_DEPARTMENT_OTHER): Payer: Medicare Other

## 2016-06-28 VITALS — BP 168/59 | HR 71 | Temp 98.2°F | Resp 20 | Wt 173.1 lb

## 2016-06-28 DIAGNOSIS — E291 Testicular hypofunction: Secondary | ICD-10-CM

## 2016-06-28 DIAGNOSIS — I519 Heart disease, unspecified: Secondary | ICD-10-CM | POA: Diagnosis not present

## 2016-06-28 DIAGNOSIS — R55 Syncope and collapse: Secondary | ICD-10-CM | POA: Diagnosis not present

## 2016-06-28 DIAGNOSIS — C8307 Small cell B-cell lymphoma, spleen: Secondary | ICD-10-CM

## 2016-06-28 DIAGNOSIS — N182 Chronic kidney disease, stage 2 (mild): Secondary | ICD-10-CM

## 2016-06-28 DIAGNOSIS — E349 Endocrine disorder, unspecified: Secondary | ICD-10-CM

## 2016-06-28 DIAGNOSIS — N189 Chronic kidney disease, unspecified: Secondary | ICD-10-CM

## 2016-06-28 LAB — CMP (CANCER CENTER ONLY)
ALBUMIN: 3.4 g/dL (ref 3.3–5.5)
ALT(SGPT): 19 U/L (ref 10–47)
AST: 21 U/L (ref 11–38)
Alkaline Phosphatase: 94 U/L — ABNORMAL HIGH (ref 26–84)
BILIRUBIN TOTAL: 0.8 mg/dL (ref 0.20–1.60)
BUN, Bld: 81 mg/dL — ABNORMAL HIGH (ref 7–22)
CALCIUM: 9.4 mg/dL (ref 8.0–10.3)
CO2: 28 meq/L (ref 18–33)
Chloride: 101 mEq/L (ref 98–108)
Creat: 2.5 mg/dl — ABNORMAL HIGH (ref 0.6–1.2)
GLUCOSE: 135 mg/dL — AB (ref 73–118)
POTASSIUM: 4.5 meq/L (ref 3.3–4.7)
Sodium: 143 mEq/L (ref 128–145)
Total Protein: 7.1 g/dL (ref 6.4–8.1)

## 2016-06-28 LAB — CBC WITH DIFFERENTIAL (CANCER CENTER ONLY)
BASO#: 0 10*3/uL (ref 0.0–0.2)
BASO%: 0.5 % (ref 0.0–2.0)
EOS ABS: 0.6 10*3/uL — AB (ref 0.0–0.5)
EOS%: 7.9 % — AB (ref 0.0–7.0)
HEMATOCRIT: 30.2 % — AB (ref 38.7–49.9)
HEMOGLOBIN: 10.4 g/dL — AB (ref 13.0–17.1)
LYMPH#: 1.8 10*3/uL (ref 0.9–3.3)
LYMPH%: 23.4 % (ref 14.0–48.0)
MCH: 35.5 pg — AB (ref 28.0–33.4)
MCHC: 34.4 g/dL (ref 32.0–35.9)
MCV: 103 fL — AB (ref 82–98)
MONO#: 1.2 10*3/uL — AB (ref 0.1–0.9)
MONO%: 15.4 % — ABNORMAL HIGH (ref 0.0–13.0)
NEUT%: 52.8 % (ref 40.0–80.0)
NEUTROS ABS: 4.1 10*3/uL (ref 1.5–6.5)
Platelets: 248 10*3/uL (ref 145–400)
RBC: 2.93 10*6/uL — ABNORMAL LOW (ref 4.20–5.70)
RDW: 14.1 % (ref 11.1–15.7)
WBC: 7.8 10*3/uL (ref 4.0–10.0)

## 2016-06-28 MED ORDER — DARBEPOETIN ALFA 300 MCG/0.6ML IJ SOSY
PREFILLED_SYRINGE | INTRAMUSCULAR | Status: AC
  Filled 2016-06-28: qty 0.6

## 2016-06-28 MED ORDER — TESTOSTERONE CYPIONATE 200 MG/ML IM SOLN
INTRAMUSCULAR | Status: AC
  Filled 2016-06-28: qty 2

## 2016-06-28 MED ORDER — TESTOSTERONE CYPIONATE 200 MG/ML IM SOLN
400.0000 mg | INTRAMUSCULAR | Status: DC
  Administered 2016-06-28: 400 mg via INTRAMUSCULAR

## 2016-06-28 NOTE — Progress Notes (Signed)
Hematology and Oncology Follow Up Visit  Larry [name] [id number] Sep 17, 1940 76 y.o. 06/28/2016   Principle Diagnosis:  1. 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  2. Hypotestosteronemia.  3. Chronic renal insufficiency.  Current Therapy:   Testosterone 400 mg IM once a month    Interim History: Larry [name] is here today for a follow-up. He is doing okay. He has this unusual skin rash. Not sure exactly what this might be attributed to.  Thankfully, his kidneys are doing okay. He has not required any dialysis.  He does have some carotid stenosis. He is on aspirin and Lipitor.   He has had no he was admitted. He had an episode of syncope. e checked his stat glucose was 132.   He had a little bit of disorientation.  Medications:  Allergies as of 06/28/2016      Reactions   Iohexol Other (See Comments)   Pt states has history of reaction in the past, can not remember what exactly happened, but was told by the dr never to have the contrast again.      Medication List       Accurate as of 06/28/16  3:14 PM. Always use your most recent med list.          allopurinol 100 MG tablet Commonly known as:  ZYLOPRIM Take 100 mg by mouth daily.   aspirin 81 MG EC tablet Take 1 tablet (81 mg total) by mouth daily.   atorvastatin 10 MG tablet Commonly known as:  LIPITOR Take 10 mg by mouth daily.   carvedilol 3.125 MG tablet Commonly known as:  COREG Take 1 tablet (3.125 mg total) by mouth 2 (two) times daily with a meal.   doxazosin 4 MG tablet Commonly known as:  CARDURA Take 8 mg by mouth 2 (two) times daily. 2 tabs twice daily   furosemide 80 MG tablet Commonly known as:  LASIX Take 80 mg by mouth.   levothyroxine 175 MCG tablet Commonly known as:  SYNTHROID, LEVOTHROID Take 175 mcg by mouth daily before breakfast.   omeprazole 20 MG capsule Commonly known as:  PRILOSEC Take 20 mg by mouth 2 (two) times daily.   pindolol 10 MG  tablet Commonly known as:  VISKEN Take 10 mg by mouth daily.   sitaGLIPtin 25 MG tablet Commonly known as:  JANUVIA Take 1 tablet (25 mg total) by mouth daily.   triamcinolone ointment 0.1 % Commonly known as:  KENALOG Apply 1 application topically daily as needed (for irritation).       Allergies:  Allergies  Allergen Reactions  . Iohexol Other (See Comments)    Pt states has history of reaction in the past, can not remember what exactly happened, but was told by the dr never to have the contrast again.    Past Medical History, Surgical history, Social history, and Family History were reviewed and updated.  Review of Systems: All other 10 point review of systems is negative.   Physical Exam:  weight is 173 lb 1.9 oz (78.5 kg). His oral temperature is 98.2 F (36.8 C). His blood pressure is 168/59 (abnormal) and his pulse is 71. His respiration is 20.   Wt Readings from Last 3 Encounters:  06/28/16 173 lb 1.9 oz (78.5 kg)  06/14/16 164 lb 3.2 oz (74.5 kg)  05/26/16 167 lb 12.8 oz (76.1 kg)    Well-developed and well-nourished white male in no obvious distress. Head and neck exam shows no ocular or  oral lesions. He has no scleral icterus. There is no palpable cervical or supraclavicular lymph nodes. Lungs are clear bilaterally. Cardiac exam regular rate and rhythm with no with a 2/6 systolic ejection murmur. Abdomen is soft. Has a splint to be scar. Has a laparotomy scar in the midline. A fluid wave is noted. There is no guarding or rebound tenderness. There is no palpable hepatomegaly. Extremities shows 3+ chronic edema in his lower legs. Skin exam shows some actinic keratoses. Neurological exam shows no focal neurological deficits.  Lab Results  Component Value Date   WBC 7.8 06/28/2016   HGB 10.4 (L) 06/28/2016   HCT 30.2 (L) 06/28/2016   MCV 103 (H) 06/28/2016   PLT 248 06/28/2016   Lab Results  Component Value Date   FERRITIN 249 04/20/2016   IRON 124 04/20/2016    TIBC 235 04/20/2016   UIBC 111 (L) 04/20/2016   IRONPCTSAT 53 04/20/2016   Lab Results  Component Value Date   RETICCTPCT 0.9 03/29/2011   RBC 2.93 (L) 06/28/2016   RETICCTABS 31.8 03/29/2011   No results found for: KPAFRELGTCHN, LAMBDASER, KAPLAMBRATIO No results found for: Kandis Cocking, [facility] Lab Results  Component Value Date   TOTALPROTELP 6.7 07/14/2011   ALBUMINELP 56.6 07/14/2011   A1GS 5.7 (H) 07/14/2011   A2GS 12.7 (H) 07/14/2011   BETS 4.9 07/14/2011   BETA2SER 3.8 07/14/2011   GAMS 16.3 07/14/2011   MSPIKE NOT DET 07/14/2011   SPEI * 07/14/2011     Chemistry      Component Value Date/Time   NA 143 06/28/2016 1436   NA 142 02/15/2016 0953   K 4.5 06/28/2016 1436   K 4.4 02/15/2016 0953   CL 101 06/28/2016 1436   CO2 28 06/28/2016 1436   CO2 23 02/15/2016 0953   BUN 81 (H) 06/28/2016 1436   BUN 47.4 (H) 02/15/2016 0953   CREATININE 2.5 (H) 06/28/2016 1436   CREATININE 2.6 (H) 02/15/2016 0953      Component Value Date/Time   CALCIUM 9.4 06/28/2016 1436   CALCIUM 9.1 02/15/2016 0953   ALKPHOS 94 (H) 06/28/2016 1436   ALKPHOS 99 02/15/2016 0953   AST 21 06/28/2016 1436   AST 16 02/15/2016 0953   ALT 19 06/28/2016 1436   ALT 7 02/15/2016 0953   BILITOT 0.80 06/28/2016 1436   BILITOT 0.93 02/15/2016 0953     Impression and Plan: [name] is a very pleasant 76 yo male with hypotestosteronemia. He also has a history of splenic marginal zone lymphoma. He had a splenectomy in 2009 and completed 1 cycle of Rituxan in April 2010. So far, there has been no evidence of recurrence.   It looks like he had a vasovagal reaction in the office. He has wife were here as Gaylyn Rong and Mrs. Claus. He all of a sudden got weak. He got diaphoretic. His blood pressure was 61/30. His pulse was 50.   We checked his blood sugar. It was 132.   The ER staff came up. They got him down to the emergency room.   We will have to see what their workup found.  I'll plan  to get him back in one month. Volanda Napoleon, MD 2/28/20183:14 PM

## 2016-06-28 NOTE — Patient Instructions (Signed)
Testosterone injection What is this medicine? TESTOSTERONE (tes TOS ter one) is the main male hormone. It supports normal male development such as muscle growth, facial hair, and deep voice. It is used in males to treat low testosterone levels. This medicine may be used for other purposes; ask your health care provider or pharmacist if you have questions. COMMON BRAND NAME(S): Andro-L.A., Aveed, Delatestryl, Depo-Testosterone, Virilon What should I tell my health care provider before I take this medicine? They need to know if you have any of these conditions: -cancer -diabetes -heart disease -kidney disease -liver disease -lung disease -prostate disease -an unusual or allergic reaction to testosterone, other medicines, foods, dyes, or preservatives -pregnant or trying to get pregnant -breast-feeding How should I use this medicine? This medicine is for injection into a muscle. It is usually given by a health care professional in a hospital or clinic setting. Contact your pediatrician regarding the use of this medicine in children. While this medicine may be prescribed for children as young as 12 years of age for selected conditions, precautions do apply. Overdosage: If you think you have taken too much of this medicine contact a poison control center or emergency room at once. NOTE: This medicine is only for you. Do not share this medicine with others. What if I miss a dose? Try not to miss a dose. Your doctor or health care professional will tell you when your next injection is due. Notify the office if you are unable to keep an appointment. What may interact with this medicine? -medicines for diabetes -medicines that treat or prevent blood clots like warfarin -oxyphenbutazone -propranolol -steroid medicines like prednisone or cortisone This list may not describe all possible interactions. Give your health care provider a list of all the medicines, herbs, non-prescription drugs, or  dietary supplements you use. Also tell them if you smoke, drink alcohol, or use illegal drugs. Some items may interact with your medicine. What should I watch for while using this medicine? Visit your doctor or health care professional for regular checks on your progress. They will need to check the level of testosterone in your blood. This medicine is only approved for use in men who have low levels of testosterone related to certain medical conditions. Heart attacks and strokes have been reported with the use of this medicine. Notify your doctor or health care professional and seek emergency treatment if you develop breathing problems; changes in vision; confusion; chest pain or chest tightness; sudden arm pain; severe, sudden headache; trouble speaking or understanding; sudden numbness or weakness of the face, arm or leg; loss of balance or coordination. Talk to your doctor about the risks and benefits of this medicine. This medicine may affect blood sugar levels. If you have diabetes, check with your doctor or health care professional before you change your diet or the dose of your diabetic medicine. Testosterone injections are not commonly used in women. Women should inform their doctor if they wish to become pregnant or think they might be pregnant. There is a potential for serious side effects to an unborn child. Talk to your health care professional or pharmacist for more information. Talk with your doctor or health care professional about your birth control options while taking this medicine. This drug is banned from use in athletes by most athletic organizations. What side effects may I notice from receiving this medicine? Side effects that you should report to your doctor or health care professional as soon as possible: -allergic reactions like skin rash,   itching or hives, swelling of the face, lips, or tongue -breast enlargement -breathing problems -changes in emotions or moods -deep or  hoarse voice -irregular menstrual periods -signs and symptoms of liver injury like dark yellow or brown urine; general ill feeling or flu-like symptoms; light-colored stools; loss of appetite; nausea; right upper belly pain; unusually weak or tired; yellowing of the eyes or skin -stomach pain -swelling of the ankles, feet, hands -too frequent or persistent erections -trouble passing urine or change in the amount of urine Side effects that usually do not require medical attention (report to your doctor or health care professional if they continue or are bothersome): -acne -change in sex drive or performance -facial hair growth -hair loss -headache This list may not describe all possible side effects. Call your doctor for medical advice about side effects. You may report side effects to FDA at 1-800-FDA-1088. Where should I keep my medicine? Keep out of the reach of children. This medicine can be abused. Keep your medicine in a safe place to protect it from theft. Do not share this medicine with anyone. Selling or giving away this medicine is dangerous and against the law. Store at room temperature between 20 and 25 degrees C (68 and 77 degrees F). Do not freeze. Protect from light. Follow the directions for the product you are prescribed. Throw away any unused medicine after the expiration date. NOTE: This sheet is a summary. It may not cover all possible information. If you have questions about this medicine, talk to your doctor, pharmacist, or health care provider.  2018 Elsevier/Gold Standard (2015-05-22 07:33:55)  

## 2016-06-29 LAB — TESTOSTERONE: Testosterone, Serum: 129 ng/dL — ABNORMAL LOW (ref 264–916)

## 2016-06-30 DIAGNOSIS — R55 Syncope and collapse: Secondary | ICD-10-CM | POA: Diagnosis not present

## 2016-06-30 DIAGNOSIS — N189 Chronic kidney disease, unspecified: Secondary | ICD-10-CM | POA: Diagnosis not present

## 2016-06-30 DIAGNOSIS — E1121 Type 2 diabetes mellitus with diabetic nephropathy: Secondary | ICD-10-CM | POA: Diagnosis not present

## 2016-06-30 DIAGNOSIS — L309 Dermatitis, unspecified: Secondary | ICD-10-CM | POA: Diagnosis not present

## 2016-06-30 DIAGNOSIS — I779 Disorder of arteries and arterioles, unspecified: Secondary | ICD-10-CM | POA: Diagnosis not present

## 2016-06-30 DIAGNOSIS — Z7984 Long term (current) use of oral hypoglycemic drugs: Secondary | ICD-10-CM | POA: Diagnosis not present

## 2016-06-30 DIAGNOSIS — I10 Essential (primary) hypertension: Secondary | ICD-10-CM | POA: Diagnosis not present

## 2016-06-30 DIAGNOSIS — E039 Hypothyroidism, unspecified: Secondary | ICD-10-CM | POA: Diagnosis not present

## 2016-07-24 DIAGNOSIS — I10 Essential (primary) hypertension: Secondary | ICD-10-CM | POA: Diagnosis not present

## 2016-07-24 DIAGNOSIS — E785 Hyperlipidemia, unspecified: Secondary | ICD-10-CM | POA: Diagnosis not present

## 2016-07-26 ENCOUNTER — Ambulatory Visit (HOSPITAL_BASED_OUTPATIENT_CLINIC_OR_DEPARTMENT_OTHER): Payer: Medicare Other

## 2016-07-26 VITALS — BP 166/63 | HR 74 | Temp 97.5°F | Resp 19

## 2016-07-26 DIAGNOSIS — E291 Testicular hypofunction: Secondary | ICD-10-CM

## 2016-07-26 DIAGNOSIS — E349 Endocrine disorder, unspecified: Secondary | ICD-10-CM

## 2016-07-26 MED ORDER — TESTOSTERONE CYPIONATE 200 MG/ML IM SOLN
400.0000 mg | INTRAMUSCULAR | Status: DC
  Administered 2016-07-26: 400 mg via INTRAMUSCULAR

## 2016-07-26 MED ORDER — TESTOSTERONE CYPIONATE 200 MG/ML IM SOLN
INTRAMUSCULAR | Status: AC
  Filled 2016-07-26: qty 2

## 2016-07-26 NOTE — Patient Instructions (Signed)
Testosterone injection What is this medicine? TESTOSTERONE (tes TOS ter one) is the main male hormone. It supports normal male development such as muscle growth, facial hair, and deep voice. It is used in males to treat low testosterone levels. This medicine may be used for other purposes; ask your health care provider or pharmacist if you have questions. COMMON BRAND NAME(S): Andro-L.A., Aveed, Delatestryl, Depo-Testosterone, Virilon What should I tell my health care provider before I take this medicine? They need to know if you have any of these conditions: -cancer -diabetes -heart disease -kidney disease -liver disease -lung disease -prostate disease -an unusual or allergic reaction to testosterone, other medicines, foods, dyes, or preservatives -pregnant or trying to get pregnant -breast-feeding How should I use this medicine? This medicine is for injection into a muscle. It is usually given by a health care professional in a hospital or clinic setting. Contact your pediatrician regarding the use of this medicine in children. While this medicine may be prescribed for children as young as 12 years of age for selected conditions, precautions do apply. Overdosage: If you think you have taken too much of this medicine contact a poison control center or emergency room at once. NOTE: This medicine is only for you. Do not share this medicine with others. What if I miss a dose? Try not to miss a dose. Your doctor or health care professional will tell you when your next injection is due. Notify the office if you are unable to keep an appointment. What may interact with this medicine? -medicines for diabetes -medicines that treat or prevent blood clots like warfarin -oxyphenbutazone -propranolol -steroid medicines like prednisone or cortisone This list may not describe all possible interactions. Give your health care provider a list of all the medicines, herbs, non-prescription drugs, or  dietary supplements you use. Also tell them if you smoke, drink alcohol, or use illegal drugs. Some items may interact with your medicine. What should I watch for while using this medicine? Visit your doctor or health care professional for regular checks on your progress. They will need to check the level of testosterone in your blood. This medicine is only approved for use in men who have low levels of testosterone related to certain medical conditions. Heart attacks and strokes have been reported with the use of this medicine. Notify your doctor or health care professional and seek emergency treatment if you develop breathing problems; changes in vision; confusion; chest pain or chest tightness; sudden arm pain; severe, sudden headache; trouble speaking or understanding; sudden numbness or weakness of the face, arm or leg; loss of balance or coordination. Talk to your doctor about the risks and benefits of this medicine. This medicine may affect blood sugar levels. If you have diabetes, check with your doctor or health care professional before you change your diet or the dose of your diabetic medicine. Testosterone injections are not commonly used in women. Women should inform their doctor if they wish to become pregnant or think they might be pregnant. There is a potential for serious side effects to an unborn child. Talk to your health care professional or pharmacist for more information. Talk with your doctor or health care professional about your birth control options while taking this medicine. This drug is banned from use in athletes by most athletic organizations. What side effects may I notice from receiving this medicine? Side effects that you should report to your doctor or health care professional as soon as possible: -allergic reactions like skin rash,   itching or hives, swelling of the face, lips, or tongue -breast enlargement -breathing problems -changes in emotions or moods -deep or  hoarse voice -irregular menstrual periods -signs and symptoms of liver injury like dark yellow or brown urine; general ill feeling or flu-like symptoms; light-colored stools; loss of appetite; nausea; right upper belly pain; unusually weak or tired; yellowing of the eyes or skin -stomach pain -swelling of the ankles, feet, hands -too frequent or persistent erections -trouble passing urine or change in the amount of urine Side effects that usually do not require medical attention (report to your doctor or health care professional if they continue or are bothersome): -acne -change in sex drive or performance -facial hair growth -hair loss -headache This list may not describe all possible side effects. Call your doctor for medical advice about side effects. You may report side effects to FDA at 1-800-FDA-1088. Where should I keep my medicine? Keep out of the reach of children. This medicine can be abused. Keep your medicine in a safe place to protect it from theft. Do not share this medicine with anyone. Selling or giving away this medicine is dangerous and against the law. Store at room temperature between 20 and 25 degrees C (68 and 77 degrees F). Do not freeze. Protect from light. Follow the directions for the product you are prescribed. Throw away any unused medicine after the expiration date. NOTE: This sheet is a summary. It may not cover all possible information. If you have questions about this medicine, talk to your doctor, pharmacist, or health care provider.  2018 Elsevier/Gold Standard (2015-05-22 07:33:55)  

## 2016-08-02 DIAGNOSIS — I10 Essential (primary) hypertension: Secondary | ICD-10-CM | POA: Diagnosis not present

## 2016-08-02 DIAGNOSIS — N184 Chronic kidney disease, stage 4 (severe): Secondary | ICD-10-CM | POA: Diagnosis not present

## 2016-08-02 DIAGNOSIS — R55 Syncope and collapse: Secondary | ICD-10-CM | POA: Diagnosis not present

## 2016-08-02 DIAGNOSIS — I5032 Chronic diastolic (congestive) heart failure: Secondary | ICD-10-CM | POA: Diagnosis not present

## 2016-08-23 ENCOUNTER — Ambulatory Visit (HOSPITAL_BASED_OUTPATIENT_CLINIC_OR_DEPARTMENT_OTHER): Payer: Medicare Other | Admitting: Family

## 2016-08-23 ENCOUNTER — Telehealth: Payer: Self-pay | Admitting: *Deleted

## 2016-08-23 ENCOUNTER — Other Ambulatory Visit (HOSPITAL_BASED_OUTPATIENT_CLINIC_OR_DEPARTMENT_OTHER): Payer: Medicare Other

## 2016-08-23 ENCOUNTER — Ambulatory Visit (HOSPITAL_BASED_OUTPATIENT_CLINIC_OR_DEPARTMENT_OTHER): Payer: Medicare Other

## 2016-08-23 VITALS — BP 144/66 | HR 66 | Temp 97.9°F | Resp 18 | Wt 178.4 lb

## 2016-08-23 DIAGNOSIS — R21 Rash and other nonspecific skin eruption: Secondary | ICD-10-CM

## 2016-08-23 DIAGNOSIS — C8307 Small cell B-cell lymphoma, spleen: Secondary | ICD-10-CM

## 2016-08-23 DIAGNOSIS — E349 Endocrine disorder, unspecified: Secondary | ICD-10-CM

## 2016-08-23 DIAGNOSIS — N189 Chronic kidney disease, unspecified: Secondary | ICD-10-CM

## 2016-08-23 DIAGNOSIS — E291 Testicular hypofunction: Secondary | ICD-10-CM

## 2016-08-23 DIAGNOSIS — N182 Chronic kidney disease, stage 2 (mild): Secondary | ICD-10-CM | POA: Diagnosis not present

## 2016-08-23 DIAGNOSIS — Z8572 Personal history of non-Hodgkin lymphomas: Secondary | ICD-10-CM

## 2016-08-23 LAB — CBC WITH DIFFERENTIAL (CANCER CENTER ONLY)
BASO#: 0 10*3/uL (ref 0.0–0.2)
BASO%: 0.2 % (ref 0.0–2.0)
EOS ABS: 1.9 10*3/uL — AB (ref 0.0–0.5)
EOS%: 16.8 % — ABNORMAL HIGH (ref 0.0–7.0)
HCT: 29.8 % — ABNORMAL LOW (ref 38.7–49.9)
HEMOGLOBIN: 10.2 g/dL — AB (ref 13.0–17.1)
LYMPH#: 1.1 10*3/uL (ref 0.9–3.3)
LYMPH%: 10.3 % — ABNORMAL LOW (ref 14.0–48.0)
MCH: 36.2 pg — AB (ref 28.0–33.4)
MCHC: 34.2 g/dL (ref 32.0–35.9)
MCV: 106 fL — ABNORMAL HIGH (ref 82–98)
MONO#: 1.2 10*3/uL — ABNORMAL HIGH (ref 0.1–0.9)
MONO%: 10.9 % (ref 0.0–13.0)
NEUT%: 61.8 % (ref 40.0–80.0)
NEUTROS ABS: 6.9 10*3/uL — AB (ref 1.5–6.5)
Platelets: 259 10*3/uL (ref 145–400)
RBC: 2.82 10*6/uL — AB (ref 4.20–5.70)
RDW: 13.2 % (ref 11.1–15.7)
WBC: 11.1 10*3/uL — ABNORMAL HIGH (ref 4.0–10.0)

## 2016-08-23 LAB — CMP (CANCER CENTER ONLY)
ALBUMIN: 3.2 g/dL — AB (ref 3.3–5.5)
ALK PHOS: 86 U/L — AB (ref 26–84)
ALT(SGPT): 22 U/L (ref 10–47)
AST: 21 U/L (ref 11–38)
BILIRUBIN TOTAL: 0.7 mg/dL (ref 0.20–1.60)
BUN, Bld: 75 mg/dL — ABNORMAL HIGH (ref 7–22)
CO2: 26 meq/L (ref 18–33)
CREATININE: 3.5 mg/dL — AB (ref 0.6–1.2)
Calcium: 9 mg/dL (ref 8.0–10.3)
Chloride: 102 mEq/L (ref 98–108)
Glucose, Bld: 234 mg/dL — ABNORMAL HIGH (ref 73–118)
Potassium: 4.5 mEq/L (ref 3.3–4.7)
SODIUM: 139 meq/L (ref 128–145)
Total Protein: 6.7 g/dL (ref 6.4–8.1)

## 2016-08-23 LAB — FERRITIN: FERRITIN: 191 ng/mL (ref 22–316)

## 2016-08-23 LAB — IRON AND TIBC
%SAT: 25 % (ref 20–55)
IRON: 51 ug/dL (ref 42–163)
TIBC: 204 ug/dL (ref 202–409)
UIBC: 153 ug/dL (ref 117–376)

## 2016-08-23 MED ORDER — TESTOSTERONE CYPIONATE 200 MG/ML IM SOLN
400.0000 mg | INTRAMUSCULAR | Status: DC
  Administered 2016-08-23: 400 mg via INTRAMUSCULAR

## 2016-08-23 MED ORDER — METHYLPREDNISOLONE 4 MG PO TBPK: ORAL_TABLET | ORAL | 0 refills | Status: DC

## 2016-08-23 MED ORDER — TESTOSTERONE CYPIONATE 200 MG/ML IM SOLN
INTRAMUSCULAR | Status: AC
  Filled 2016-08-23: qty 2

## 2016-08-23 MED ORDER — CEPHALEXIN 250 MG PO CAPS: 250.0000 mg | ORAL_CAPSULE | Freq: Three times a day (TID) | ORAL | 0 refills | Status: DC

## 2016-08-23 MED FILL — METHYLPREDNISOLONE 4 MG TAB: 4 | 6 days supply | Qty: 21 | Fill #0

## 2016-08-23 MED FILL — CEPHALEXIN 250 MG CAPSULE: 250 | 10 days supply | Qty: 30 | Fill #0

## 2016-08-23 NOTE — Patient Instructions (Signed)
Testosterone injection What is this medicine? TESTOSTERONE (tes TOS ter one) is the main male hormone. It supports normal male development such as muscle growth, facial hair, and deep voice. It is used in males to treat low testosterone levels. This medicine may be used for other purposes; ask your health care provider or pharmacist if you have questions. COMMON BRAND NAME(S): Andro-L.A., Aveed, Delatestryl, Depo-Testosterone, Virilon What should I tell my health care provider before I take this medicine? They need to know if you have any of these conditions: -cancer -diabetes -heart disease -kidney disease -liver disease -lung disease -prostate disease -an unusual or allergic reaction to testosterone, other medicines, foods, dyes, or preservatives -pregnant or trying to get pregnant -breast-feeding How should I use this medicine? This medicine is for injection into a muscle. It is usually given by a health care professional in a hospital or clinic setting. Contact your pediatrician regarding the use of this medicine in children. While this medicine may be prescribed for children as young as 12 years of age for selected conditions, precautions do apply. Overdosage: If you think you have taken too much of this medicine contact a poison control center or emergency room at once. NOTE: This medicine is only for you. Do not share this medicine with others. What if I miss a dose? Try not to miss a dose. Your doctor or health care professional will tell you when your next injection is due. Notify the office if you are unable to keep an appointment. What may interact with this medicine? -medicines for diabetes -medicines that treat or prevent blood clots like warfarin -oxyphenbutazone -propranolol -steroid medicines like prednisone or cortisone This list may not describe all possible interactions. Give your health care provider a list of all the medicines, herbs, non-prescription drugs, or  dietary supplements you use. Also tell them if you smoke, drink alcohol, or use illegal drugs. Some items may interact with your medicine. What should I watch for while using this medicine? Visit your doctor or health care professional for regular checks on your progress. They will need to check the level of testosterone in your blood. This medicine is only approved for use in men who have low levels of testosterone related to certain medical conditions. Heart attacks and strokes have been reported with the use of this medicine. Notify your doctor or health care professional and seek emergency treatment if you develop breathing problems; changes in vision; confusion; chest pain or chest tightness; sudden arm pain; severe, sudden headache; trouble speaking or understanding; sudden numbness or weakness of the face, arm or leg; loss of balance or coordination. Talk to your doctor about the risks and benefits of this medicine. This medicine may affect blood sugar levels. If you have diabetes, check with your doctor or health care professional before you change your diet or the dose of your diabetic medicine. Testosterone injections are not commonly used in women. Women should inform their doctor if they wish to become pregnant or think they might be pregnant. There is a potential for serious side effects to an unborn child. Talk to your health care professional or pharmacist for more information. Talk with your doctor or health care professional about your birth control options while taking this medicine. This drug is banned from use in athletes by most athletic organizations. What side effects may I notice from receiving this medicine? Side effects that you should report to your doctor or health care professional as soon as possible: -allergic reactions like skin rash,   itching or hives, swelling of the face, lips, or tongue -breast enlargement -breathing problems -changes in emotions or moods -deep or  hoarse voice -irregular menstrual periods -signs and symptoms of liver injury like dark yellow or brown urine; general ill feeling or flu-like symptoms; light-colored stools; loss of appetite; nausea; right upper belly pain; unusually weak or tired; yellowing of the eyes or skin -stomach pain -swelling of the ankles, feet, hands -too frequent or persistent erections -trouble passing urine or change in the amount of urine Side effects that usually do not require medical attention (report to your doctor or health care professional if they continue or are bothersome): -acne -change in sex drive or performance -facial hair growth -hair loss -headache This list may not describe all possible side effects. Call your doctor for medical advice about side effects. You may report side effects to FDA at 1-800-FDA-1088. Where should I keep my medicine? Keep out of the reach of children. This medicine can be abused. Keep your medicine in a safe place to protect it from theft. Do not share this medicine with anyone. Selling or giving away this medicine is dangerous and against the law. Store at room temperature between 20 and 25 degrees C (68 and 77 degrees F). Do not freeze. Protect from light. Follow the directions for the product you are prescribed. Throw away any unused medicine after the expiration date. NOTE: This sheet is a summary. It may not cover all possible information. If you have questions about this medicine, talk to your doctor, pharmacist, or health care provider.  2018 Elsevier/Gold Standard (2015-05-22 07:33:55)  
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
## 2016-08-23 NOTE — Telephone Encounter (Signed)
Critical Value Creatinine 3.5 Dr Ennever notified. No orders at this time.  

## 2016-08-24 LAB — TESTOSTERONE: Testosterone, Serum: 220 ng/dL — ABNORMAL LOW (ref 264–916)

## 2016-08-26 LAB — ERYTHROPOIETIN: ERYTHROPOIETIN: 10.5 m[IU]/mL (ref 2.6–18.5)

## 2016-09-05 ENCOUNTER — Other Ambulatory Visit: Payer: Self-pay | Admitting: *Deleted

## 2016-09-05 DIAGNOSIS — C8307 Small cell B-cell lymphoma, spleen: Secondary | ICD-10-CM

## 2016-09-06 ENCOUNTER — Other Ambulatory Visit (HOSPITAL_BASED_OUTPATIENT_CLINIC_OR_DEPARTMENT_OTHER): Payer: Medicare Other

## 2016-09-06 ENCOUNTER — Ambulatory Visit (HOSPITAL_BASED_OUTPATIENT_CLINIC_OR_DEPARTMENT_OTHER): Payer: Medicare Other | Admitting: Hematology & Oncology

## 2016-09-06 ENCOUNTER — Encounter: Payer: Self-pay | Admitting: Hematology & Oncology

## 2016-09-06 VITALS — BP 140/50 | HR 60 | Temp 98.3°F | Resp 18 | Wt 181.0 lb

## 2016-09-06 DIAGNOSIS — E349 Endocrine disorder, unspecified: Secondary | ICD-10-CM

## 2016-09-06 DIAGNOSIS — N189 Chronic kidney disease, unspecified: Secondary | ICD-10-CM

## 2016-09-06 DIAGNOSIS — E291 Testicular hypofunction: Secondary | ICD-10-CM

## 2016-09-06 DIAGNOSIS — D631 Anemia in chronic kidney disease: Secondary | ICD-10-CM

## 2016-09-06 DIAGNOSIS — C8307 Small cell B-cell lymphoma, spleen: Secondary | ICD-10-CM

## 2016-09-06 DIAGNOSIS — Z8572 Personal history of non-Hodgkin lymphomas: Secondary | ICD-10-CM | POA: Diagnosis present

## 2016-09-06 DIAGNOSIS — N184 Chronic kidney disease, stage 4 (severe): Secondary | ICD-10-CM

## 2016-09-06 DIAGNOSIS — R21 Rash and other nonspecific skin eruption: Secondary | ICD-10-CM

## 2016-09-06 HISTORY — DX: Anemia in chronic kidney disease: D63.1

## 2016-09-06 LAB — CMP (CANCER CENTER ONLY)
ALBUMIN: 3.1 g/dL — AB (ref 3.3–5.5)
ALT(SGPT): 27 U/L (ref 10–47)
AST: 23 U/L (ref 11–38)
Alkaline Phosphatase: 91 U/L — ABNORMAL HIGH (ref 26–84)
BUN: 75 mg/dL — AB (ref 7–22)
CALCIUM: 8.6 mg/dL (ref 8.0–10.3)
CHLORIDE: 106 meq/L (ref 98–108)
CO2: 24 mEq/L (ref 18–33)
Creat: 3.4 mg/dl (ref 0.6–1.2)
GLUCOSE: 191 mg/dL — AB (ref 73–118)
Potassium: 4.5 mEq/L (ref 3.3–4.7)
Sodium: 139 mEq/L (ref 128–145)
TOTAL PROTEIN: 6.4 g/dL (ref 6.4–8.1)
Total Bilirubin: 0.8 mg/dl (ref 0.20–1.60)

## 2016-09-06 LAB — CBC WITH DIFFERENTIAL (CANCER CENTER ONLY)
BASO#: 0 10*3/uL (ref 0.0–0.2)
BASO%: 0.3 % (ref 0.0–2.0)
EOS ABS: 1.7 10*3/uL — AB (ref 0.0–0.5)
EOS%: 13.8 % — ABNORMAL HIGH (ref 0.0–7.0)
HEMATOCRIT: 26.7 % — AB (ref 38.7–49.9)
HEMOGLOBIN: 9.3 g/dL — AB (ref 13.0–17.1)
LYMPH#: 1.4 10*3/uL (ref 0.9–3.3)
LYMPH%: 11.6 % — ABNORMAL LOW (ref 14.0–48.0)
MCH: 36.3 pg — AB (ref 28.0–33.4)
MCHC: 34.8 g/dL (ref 32.0–35.9)
MCV: 104 fL — AB (ref 82–98)
MONO#: 2.1 10*3/uL — AB (ref 0.1–0.9)
MONO%: 17.4 % — ABNORMAL HIGH (ref 0.0–13.0)
NEUT%: 56.9 % (ref 40.0–80.0)
NEUTROS ABS: 6.8 10*3/uL — AB (ref 1.5–6.5)
Platelets: 190 10*3/uL (ref 145–400)
RBC: 2.56 10*6/uL — AB (ref 4.20–5.70)
RDW: 13.7 % (ref 11.1–15.7)
WBC: 11.9 10*3/uL — AB (ref 4.0–10.0)

## 2016-09-06 NOTE — Progress Notes (Signed)
Hematology and Oncology Follow Up Visit  Larry [name] [id number] 05-03-1940 76 y.o. 09/06/2016   Principle Diagnosis:  1. 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 Chronic renal insufficiency 4. Anemia of renal insufficiency  Current Therapy:   Testosterone 400 mg IM once a month  Aranesp 300 g subcutaneous every 4 weeks for hemoglobin less than 10   Interim History:  Larry [name] is here today with his wife for follow-up. It is clear that he has anemia of renal insufficiency. We did check a erythropoietin level on him. His erythropoietin level was only 10.  His hemoglobin continues to drop. Because of this, he is feeling more fatigued. He's has more shortness of breath. He just is not as able to do activities.  I think we are going to have to consider him for Aranesp.  I talked to he and his wife about Aranesp. I explained to them what Aranesp is and how it works. I cannot thought that it would help him and it would get his hemoglobin higher. I told him that his body just is not making erythropoietin because of his renal insufficiency.  I explained to them the side effects. I am about high blood pressure. I told them about the risk of stroke. I told him about the risk of blood clots.  He and his wife understand all of this. He agrees to proceed. We will try to coordinate all of the Aranesp injections with his testosterone injections.  He has maintained a good appetite and is staying well hydrated. His weight is stable.   ECOG Performance Status: 1 - Symptomatic but completely ambulatory  Medications:  Allergies as of 09/06/2016      Reactions   Iohexol Other (See Comments)   Pt states has history of reaction in the past, can not remember what exactly happened, but was told by the dr never to have the contrast again.      Medication List       Accurate as of 09/06/16 11:36 AM. Always use your most recent med list.            allopurinol 100 MG tablet Commonly known as:  ZYLOPRIM Take 100 mg by mouth daily.   aspirin 81 MG EC tablet Take 1 tablet (81 mg total) by mouth daily.   atorvastatin 10 MG tablet Commonly known as:  LIPITOR Take 10 mg by mouth daily.   carvedilol 3.125 MG tablet Commonly known as:  COREG Take 1 tablet (3.125 mg total) by mouth 2 (two) times daily with a meal.   cephALEXin 250 MG capsule Commonly known as:  KEFLEX Take 1 capsule (250 mg total) by mouth 3 (three) times daily.   doxazosin 4 MG tablet Commonly known as:  CARDURA Take 8 mg by mouth 2 (two) times daily. 2 tabs twice daily   furosemide 80 MG tablet Commonly known as:  LASIX Take 80 mg by mouth.   levothyroxine 175 MCG tablet Commonly known as:  SYNTHROID, LEVOTHROID Take 175 mcg by mouth daily before breakfast.   methylPREDNISolone 4 MG Tbpk tablet Commonly known as:  MEDROL DOSEPAK Take as directed on package.   omeprazole 20 MG capsule Commonly known as:  PRILOSEC Take 20 mg by mouth 2 (two) times daily.   pindolol 10 MG tablet Commonly known as:  VISKEN Take 10 mg by mouth daily.   sitaGLIPtin 25 MG tablet Commonly known as:  JANUVIA Take 1 tablet (25 mg total) by mouth daily.  triamcinolone ointment 0.1 % Commonly known as:  KENALOG Apply 1 application topically daily as needed (for irritation).       Allergies:  Allergies  Allergen Reactions  . Iohexol Other (See Comments)    Pt states has history of reaction in the past, can not remember what exactly happened, but was told by the dr never to have the contrast again.    Past Medical History, Surgical history, Social history, and Family History were reviewed and updated.  Review of Systems: All other 10 point review of systems is negative.   Physical Exam:  weight is 181 lb (82.1 kg). His oral temperature is 98.3 F (36.8 C). His blood pressure is 140/50 (abnormal) and his pulse is 60. His respiration is 18 and oxygen saturation  is 99%.   Wt Readings from Last 3 Encounters:  09/06/16 181 lb (82.1 kg)  08/23/16 178 lb 6.4 oz (80.9 kg)  06/28/16 173 lb 1.9 oz (78.5 kg)    Well-developed and well-nourished white male in no obvious distress. Head and neck exam shows no ocular or oral lesions. He has no scleral icterus. There is no palpable cervical or supraclavicular lymph nodes. Lungs are clear bilaterally. Cardiac exam regular rate and rhythm with no with a 2/6 systolic ejection murmur. Abdomen is soft. Has a splint to be scar. Has a laparotomy scar in the midline. A fluid wave is noted. There is no guarding or rebound tenderness. There is no palpable hepatomegaly. Extremities shows 3+ chronic edema in his lower legs. Skin exam shows some actinic keratoses. Neurological exam shows no focal neurological deficits. ferred  Lab Results  Component Value Date   WBC 11.9 (H) 09/06/2016   HGB 9.3 (L) 09/06/2016   HCT 26.7 (L) 09/06/2016   MCV 104 (H) 09/06/2016   PLT 190 09/06/2016   Lab Results  Component Value Date   FERRITIN 191 08/23/2016   IRON 51 08/23/2016   TIBC 204 08/23/2016   UIBC 153 08/23/2016   IRONPCTSAT 25 08/23/2016   Lab Results  Component Value Date   RETICCTPCT 0.9 03/29/2011   RBC 2.56 (L) 09/06/2016   RETICCTABS 31.8 03/29/2011   No results found for: KPAFRELGTCHN, LAMBDASER, KAPLAMBRATIO No results found for: Kandis Cocking, [facility] Lab Results  Component Value Date   TOTALPROTELP 6.7 07/14/2011   ALBUMINELP 56.6 07/14/2011   A1GS 5.7 (H) 07/14/2011   A2GS 12.7 (H) 07/14/2011   BETS 4.9 07/14/2011   BETA2SER 3.8 07/14/2011   GAMS 16.3 07/14/2011   MSPIKE NOT DET 07/14/2011   SPEI * 07/14/2011     Chemistry      Component Value Date/Time   NA 139 09/06/2016 1024   NA 142 02/15/2016 0953   K 4.5 09/06/2016 1024   K 4.4 02/15/2016 0953   CL 106 09/06/2016 1024   CO2 24 09/06/2016 1024   CO2 23 02/15/2016 0953   BUN 75 (H) 09/06/2016 1024   BUN 47.4 (H) 02/15/2016 0953    CREATININE 3.4 (HH) 09/06/2016 1024   CREATININE 2.6 (H) 02/15/2016 0953      Component Value Date/Time   CALCIUM 8.6 09/06/2016 1024   CALCIUM 9.1 02/15/2016 0953   ALKPHOS 91 (H) 09/06/2016 1024   ALKPHOS 99 02/15/2016 0953   AST 23 09/06/2016 1024   AST 16 02/15/2016 0953   ALT 27 09/06/2016 1024   ALT 7 02/15/2016 0953   BILITOT 0.80 09/06/2016 1024   BILITOT 0.93 02/15/2016 0953      Impression and Plan:  Larry [name] is a very pleasant 76 yo caucasian male with hypotestosteronemia. He also has history of splenic marginal zone lymphoma with splenectomy in 2009. He then completed 1 cycle of Rituxan in April 2010. So far he has done well and there has been no recurrence.   He still has this rash on his abdomen. I'm not sure what this is. Somehow, I think it might be related to his renal insufficiency. It does look better. It is not as symptomatic. We will follow this. At some point, we may have to consider referral to dermatology for a biopsy.   We will see about the Aranesp and testosterone injections in a month. I know this will help her mouth. Hopefully it will give him more energy.   I spent about 30 minutes with he and his wife today.   Volanda Napoleon, MD 5/9/201811:36 AM

## 2016-09-11 DIAGNOSIS — Z6827 Body mass index (BMI) 27.0-27.9, adult: Secondary | ICD-10-CM | POA: Diagnosis not present

## 2016-09-11 DIAGNOSIS — R600 Localized edema: Secondary | ICD-10-CM | POA: Diagnosis not present

## 2016-09-11 DIAGNOSIS — E213 Hyperparathyroidism, unspecified: Secondary | ICD-10-CM | POA: Diagnosis not present

## 2016-09-11 DIAGNOSIS — I10 Essential (primary) hypertension: Secondary | ICD-10-CM | POA: Diagnosis not present

## 2016-09-11 DIAGNOSIS — N185 Chronic kidney disease, stage 5: Secondary | ICD-10-CM | POA: Diagnosis not present

## 2016-09-15 ENCOUNTER — Encounter: Payer: Self-pay | Admitting: Vascular Surgery

## 2016-09-15 DIAGNOSIS — N185 Chronic kidney disease, stage 5: Secondary | ICD-10-CM | POA: Insufficient documentation

## 2016-09-18 ENCOUNTER — Other Ambulatory Visit: Payer: Self-pay

## 2016-09-18 DIAGNOSIS — N185 Chronic kidney disease, stage 5: Secondary | ICD-10-CM

## 2016-09-20 ENCOUNTER — Ambulatory Visit: Payer: Medicare Other

## 2016-09-21 ENCOUNTER — Telehealth: Payer: Self-pay | Admitting: *Deleted

## 2016-09-21 ENCOUNTER — Ambulatory Visit (HOSPITAL_BASED_OUTPATIENT_CLINIC_OR_DEPARTMENT_OTHER): Payer: Medicare Other | Admitting: Family

## 2016-09-21 ENCOUNTER — Other Ambulatory Visit (HOSPITAL_BASED_OUTPATIENT_CLINIC_OR_DEPARTMENT_OTHER): Payer: Medicare Other

## 2016-09-21 ENCOUNTER — Ambulatory Visit (HOSPITAL_BASED_OUTPATIENT_CLINIC_OR_DEPARTMENT_OTHER): Payer: Medicare Other

## 2016-09-21 VITALS — BP 125/54 | HR 55 | Temp 98.1°F | Resp 16 | Wt 169.4 lb

## 2016-09-21 DIAGNOSIS — D631 Anemia in chronic kidney disease: Secondary | ICD-10-CM

## 2016-09-21 DIAGNOSIS — E291 Testicular hypofunction: Secondary | ICD-10-CM

## 2016-09-21 DIAGNOSIS — R531 Weakness: Secondary | ICD-10-CM

## 2016-09-21 DIAGNOSIS — E349 Endocrine disorder, unspecified: Secondary | ICD-10-CM | POA: Diagnosis not present

## 2016-09-21 DIAGNOSIS — N184 Chronic kidney disease, stage 4 (severe): Principal | ICD-10-CM

## 2016-09-21 DIAGNOSIS — R5383 Other fatigue: Secondary | ICD-10-CM | POA: Diagnosis not present

## 2016-09-21 DIAGNOSIS — Z8572 Personal history of non-Hodgkin lymphomas: Secondary | ICD-10-CM

## 2016-09-21 DIAGNOSIS — D508 Other iron deficiency anemias: Secondary | ICD-10-CM

## 2016-09-21 DIAGNOSIS — N189 Chronic kidney disease, unspecified: Secondary | ICD-10-CM

## 2016-09-21 DIAGNOSIS — C8307 Small cell B-cell lymphoma, spleen: Secondary | ICD-10-CM

## 2016-09-21 LAB — CBC WITH DIFFERENTIAL (CANCER CENTER ONLY)
BASO#: 0 10*3/uL (ref 0.0–0.2)
BASO%: 0.5 % (ref 0.0–2.0)
EOS%: 10.4 % — ABNORMAL HIGH (ref 0.0–7.0)
Eosinophils Absolute: 0.8 10*3/uL — ABNORMAL HIGH (ref 0.0–0.5)
HEMATOCRIT: 31.4 % — AB (ref 38.7–49.9)
HEMOGLOBIN: 10.7 g/dL — AB (ref 13.0–17.1)
LYMPH#: 1.2 10*3/uL (ref 0.9–3.3)
LYMPH%: 15.5 % (ref 14.0–48.0)
MCH: 34.6 pg — ABNORMAL HIGH (ref 28.0–33.4)
MCHC: 34.1 g/dL (ref 32.0–35.9)
MCV: 102 fL — AB (ref 82–98)
MONO#: 1.2 10*3/uL — AB (ref 0.1–0.9)
MONO%: 15.4 % — ABNORMAL HIGH (ref 0.0–13.0)
NEUT#: 4.7 10*3/uL (ref 1.5–6.5)
NEUT%: 58.2 % (ref 40.0–80.0)
Platelets: 279 10*3/uL (ref 145–400)
RBC: 3.09 10*6/uL — ABNORMAL LOW (ref 4.20–5.70)
RDW: 12.9 % (ref 11.1–15.7)
WBC: 8 10*3/uL (ref 4.0–10.0)

## 2016-09-21 LAB — IRON AND TIBC
%SAT: 28 % (ref 20–55)
IRON: 63 ug/dL (ref 42–163)
TIBC: 223 ug/dL (ref 202–409)
UIBC: 160 ug/dL (ref 117–376)

## 2016-09-21 LAB — CHCC SATELLITE - SMEAR

## 2016-09-21 LAB — CMP (CANCER CENTER ONLY)
ALK PHOS: 96 U/L — AB (ref 26–84)
ALT: 21 U/L (ref 10–47)
AST: 21 U/L (ref 11–38)
Albumin: 3.4 g/dL (ref 3.3–5.5)
BUN, Bld: 89 mg/dL — ABNORMAL HIGH (ref 7–22)
CALCIUM: 9 mg/dL (ref 8.0–10.3)
CO2: 27 mEq/L (ref 18–33)
Chloride: 94 mEq/L — ABNORMAL LOW (ref 98–108)
Creat: 3.9 mg/dl (ref 0.6–1.2)
GLUCOSE: 218 mg/dL — AB (ref 73–118)
POTASSIUM: 3.7 meq/L (ref 3.3–4.7)
Sodium: 135 mEq/L (ref 128–145)
Total Bilirubin: 0.5 mg/dl (ref 0.20–1.60)
Total Protein: 7.1 g/dL (ref 6.4–8.1)

## 2016-09-21 LAB — FERRITIN: Ferritin: 184 ng/ml (ref 22–316)

## 2016-09-21 MED ORDER — TESTOSTERONE CYPIONATE 200 MG/ML IM SOLN
INTRAMUSCULAR | Status: AC
  Filled 2016-09-21: qty 2

## 2016-09-21 MED ORDER — TESTOSTERONE CYPIONATE 200 MG/ML IM SOLN
400.0000 mg | INTRAMUSCULAR | Status: DC
  Administered 2016-09-21: 400 mg via INTRAMUSCULAR

## 2016-09-21 NOTE — Progress Notes (Signed)
Hematology and Oncology Follow Up Visit  Larry [name] [id number] 1940/06/29 76 y.o. 09/21/2016   Principle Diagnosis:  1. 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 Anemia of chronic renal insufficiency  Current Therapy:   Testosterone 400 mg IM once a month  Aranesp 300 g subcutaneous every 4 weeks for hemoglobin less than 10   Interim History:  Larry [name] is here today with his wife for follow-up and injection. He is feeling quite fatigued and weak. His Hgb is up to 10.7 with an MCV of 102. No Aranesp needed at this visit.   Testosterone in April was still down at 220.  No fever, chills, n/v, cough, dizziness, SOB, chest pain, palpitations, abdominal pain or changes in bowel or bladder habits.  He has patches of psoriasis on his arms and neck that are unchanged from baseline.  No episodes of bleeding, bruising or petechiae. No lymphadenopathy found on exam.  He has maintained a good appetite and is staying hydrated. His weight is stable.  He has chronic swelling in both lower extremities with weeping. He is taking lasix 80 mg PO daily to help reduce this. Creatinine today is 3.9.  He has an appoint with vascular next week to discuss possibly going ahead and starting to prepare for an AV graft for future dialysis if needed.   ECOG Performance Status: 1 - Symptomatic but completely ambulatory  Medications:  Allergies as of 09/21/2016      Reactions   Iohexol Other (See Comments)   Pt states has history of reaction in the past, can not remember what exactly happened, but was told by the dr never to have the contrast again.   Allopurinol Rash   Gout agents      Medication List       Accurate as of 09/21/16  9:38 AM. Always use your most recent med list.          allopurinol 100 MG tablet Commonly known as:  ZYLOPRIM Take 100 mg by mouth daily.   aspirin 81 MG EC tablet Take 1 tablet (81 mg total) by mouth daily.     atorvastatin 10 MG tablet Commonly known as:  LIPITOR Take 10 mg by mouth daily.   carvedilol 3.125 MG tablet Commonly known as:  COREG Take 1 tablet (3.125 mg total) by mouth 2 (two) times daily with a meal.   cephALEXin 250 MG capsule Commonly known as:  KEFLEX Take 1 capsule (250 mg total) by mouth 3 (three) times daily.   doxazosin 4 MG tablet Commonly known as:  CARDURA Take 8 mg by mouth 2 (two) times daily. 2 tabs twice daily   furosemide 80 MG tablet Commonly known as:  LASIX Take 80 mg by mouth.   levothyroxine 175 MCG tablet Commonly known as:  SYNTHROID, LEVOTHROID Take 175 mcg by mouth daily before breakfast.   methylPREDNISolone 4 MG Tbpk tablet Commonly known as:  MEDROL DOSEPAK Take as directed on package.   omeprazole 20 MG capsule Commonly known as:  PRILOSEC Take 20 mg by mouth 2 (two) times daily.   pindolol 10 MG tablet Commonly known as:  VISKEN Take 10 mg by mouth daily.   sitaGLIPtin 25 MG tablet Commonly known as:  JANUVIA Take 1 tablet (25 mg total) by mouth daily.   triamcinolone ointment 0.1 % Commonly known as:  KENALOG Apply 1 application topically daily as needed (for irritation).       Allergies:  Allergies  Allergen Reactions  .  Iohexol Other (See Comments)    Pt states has history of reaction in the past, can not remember what exactly happened, but was told by the dr never to have the contrast again.  . Allopurinol Rash    Gout agents    Past Medical History, Surgical history, Social history, and Family History were reviewed and updated.  Review of Systems: All other 10 point review of systems is negative.   Physical Exam:  vitals were not taken for this visit.  Wt Readings from Last 3 Encounters:  09/06/16 181 lb (82.1 kg)  08/23/16 178 lb 6.4 oz (80.9 kg)  06/28/16 173 lb 1.9 oz (78.5 kg)    Ocular: Sclerae unicteric, pupils equal, round and reactive to light Ear-nose-throat: Oropharynx clear, dentition  fair Lymphatic: No cervical, supraclavicular or axillary adenopathy Lungs no rales or rhonchi, good excursion bilaterally Heart regular rate and rhythm, no murmur appreciated Abd soft, nontender, positive bowel sounds, no liver tip palpated on exam, no fluid wave MSK no focal spinal tenderness, no joint edema Neuro: non-focal, well-oriented, appropriate affect Breasts: Deferred   Lab Results  Component Value Date   WBC 8.0 09/21/2016   HGB 10.7 (L) 09/21/2016   HCT 31.4 (L) 09/21/2016   MCV 102 (H) 09/21/2016   PLT 279 09/21/2016   Lab Results  Component Value Date   FERRITIN 191 08/23/2016   IRON 51 08/23/2016   TIBC 204 08/23/2016   UIBC 153 08/23/2016   IRONPCTSAT 25 08/23/2016   Lab Results  Component Value Date   RETICCTPCT 0.9 03/29/2011   RBC 3.09 (L) 09/21/2016   RETICCTABS 31.8 03/29/2011   No results found for: KPAFRELGTCHN, LAMBDASER, KAPLAMBRATIO No results found for: Kandis Cocking, [facility] Lab Results  Component Value Date   TOTALPROTELP 6.7 07/14/2011   ALBUMINELP 56.6 07/14/2011   A1GS 5.7 (H) 07/14/2011   A2GS 12.7 (H) 07/14/2011   BETS 4.9 07/14/2011   BETA2SER 3.8 07/14/2011   GAMS 16.3 07/14/2011   MSPIKE NOT DET 07/14/2011   SPEI * 07/14/2011     Chemistry      Component Value Date/Time   NA 139 09/06/2016 1024   NA 142 02/15/2016 0953   K 4.5 09/06/2016 1024   K 4.4 02/15/2016 0953   CL 106 09/06/2016 1024   CO2 24 09/06/2016 1024   CO2 23 02/15/2016 0953   BUN 75 (H) 09/06/2016 1024   BUN 47.4 (H) 02/15/2016 0953   CREATININE 3.4 (HH) 09/06/2016 1024   CREATININE 2.6 (H) 02/15/2016 0953      Component Value Date/Time   CALCIUM 8.6 09/06/2016 1024   CALCIUM 9.1 02/15/2016 0953   ALKPHOS 91 (H) 09/06/2016 1024   ALKPHOS 99 02/15/2016 0953   AST 23 09/06/2016 1024   AST 16 02/15/2016 0953   ALT 27 09/06/2016 1024   ALT 7 02/15/2016 0953   BILITOT 0.80 09/06/2016 1024   BILITOT 0.93 02/15/2016 0953      Impression and Plan:  Larry [name] is a very pleasant 76 yo caucasian with hypotestosteronemia. He also has history of splenic marginal zone lymphoma with splenectomy in 2009. He completed 1 cycle of Rituxan in April 2010. So far he has done well and the has been no recurrence.  He is still feeling fatigue and weak despite his Hgb having improved to 10.7. No Aranesp needed.  Testosterone level is pending. He received his testosterone injection today as planned.  He also has chronic renal failure and has an appointment with vascular next  week to discuss an AV graft for possible future dialysis if needed. We will plan to see him back in 1 months for repeat lab work and follow-up.  Both he and his wife know to contact our office with any questions or concerns. We can certainly see him sooner if need be.   Eliezer Bottom, NP 5/24/20189:38 AM

## 2016-09-21 NOTE — Telephone Encounter (Addendum)
Patient is aware of results  ----- Message from Eliezer Bottom, NP sent at 09/21/2016  3:36 PM EDT ----- Regarding: iron  Iron studies look good! Thank you!  Sarah  ----- Message ----- From: Volanda Napoleon, MD Sent: 09/21/2016   1:22 PM To: Eliezer Bottom, NP    ----- Message ----- From: Interface, Lab In Three Zero One Sent: 09/21/2016   9:36 AM To: Volanda Napoleon, MD

## 2016-09-21 NOTE — Telephone Encounter (Signed)
Critical Value Creatinine 3.9 Laverna Peace NP notified. No orders at this time

## 2016-09-21 NOTE — Patient Instructions (Signed)
Testosterone injection What is this medicine? TESTOSTERONE (tes TOS ter one) is the main male hormone. It supports normal male development such as muscle growth, facial hair, and deep voice. It is used in males to treat low testosterone levels. This medicine may be used for other purposes; ask your health care provider or pharmacist if you have questions. COMMON BRAND NAME(S): Andro-L.A., Aveed, Delatestryl, Depo-Testosterone, Virilon What should I tell my health care provider before I take this medicine? They need to know if you have any of these conditions: -cancer -diabetes -heart disease -kidney disease -liver disease -lung disease -prostate disease -an unusual or allergic reaction to testosterone, other medicines, foods, dyes, or preservatives -pregnant or trying to get pregnant -breast-feeding How should I use this medicine? This medicine is for injection into a muscle. It is usually given by a health care professional in a hospital or clinic setting. Contact your pediatrician regarding the use of this medicine in children. While this medicine may be prescribed for children as young as 12 years of age for selected conditions, precautions do apply. Overdosage: If you think you have taken too much of this medicine contact a poison control center or emergency room at once. NOTE: This medicine is only for you. Do not share this medicine with others. What if I miss a dose? Try not to miss a dose. Your doctor or health care professional will tell you when your next injection is due. Notify the office if you are unable to keep an appointment. What may interact with this medicine? -medicines for diabetes -medicines that treat or prevent blood clots like warfarin -oxyphenbutazone -propranolol -steroid medicines like prednisone or cortisone This list may not describe all possible interactions. Give your health care provider a list of all the medicines, herbs, non-prescription drugs, or  dietary supplements you use. Also tell them if you smoke, drink alcohol, or use illegal drugs. Some items may interact with your medicine. What should I watch for while using this medicine? Visit your doctor or health care professional for regular checks on your progress. They will need to check the level of testosterone in your blood. This medicine is only approved for use in men who have low levels of testosterone related to certain medical conditions. Heart attacks and strokes have been reported with the use of this medicine. Notify your doctor or health care professional and seek emergency treatment if you develop breathing problems; changes in vision; confusion; chest pain or chest tightness; sudden arm pain; severe, sudden headache; trouble speaking or understanding; sudden numbness or weakness of the face, arm or leg; loss of balance or coordination. Talk to your doctor about the risks and benefits of this medicine. This medicine may affect blood sugar levels. If you have diabetes, check with your doctor or health care professional before you change your diet or the dose of your diabetic medicine. Testosterone injections are not commonly used in women. Women should inform their doctor if they wish to become pregnant or think they might be pregnant. There is a potential for serious side effects to an unborn child. Talk to your health care professional or pharmacist for more information. Talk with your doctor or health care professional about your birth control options while taking this medicine. This drug is banned from use in athletes by most athletic organizations. What side effects may I notice from receiving this medicine? Side effects that you should report to your doctor or health care professional as soon as possible: -allergic reactions like skin rash,   itching or hives, swelling of the face, lips, or tongue -breast enlargement -breathing problems -changes in emotions or moods -deep or  hoarse voice -irregular menstrual periods -signs and symptoms of liver injury like dark yellow or brown urine; general ill feeling or flu-like symptoms; light-colored stools; loss of appetite; nausea; right upper belly pain; unusually weak or tired; yellowing of the eyes or skin -stomach pain -swelling of the ankles, feet, hands -too frequent or persistent erections -trouble passing urine or change in the amount of urine Side effects that usually do not require medical attention (report to your doctor or health care professional if they continue or are bothersome): -acne -change in sex drive or performance -facial hair growth -hair loss -headache This list may not describe all possible side effects. Call your doctor for medical advice about side effects. You may report side effects to FDA at 1-800-FDA-1088. Where should I keep my medicine? Keep out of the reach of children. This medicine can be abused. Keep your medicine in a safe place to protect it from theft. Do not share this medicine with anyone. Selling or giving away this medicine is dangerous and against the law. Store at room temperature between 20 and 25 degrees C (68 and 77 degrees F). Do not freeze. Protect from light. Follow the directions for the product you are prescribed. Throw away any unused medicine after the expiration date. NOTE: This sheet is a summary. It may not cover all possible information. If you have questions about this medicine, talk to your doctor, pharmacist, or health care provider.  2018 Elsevier/Gold Standard (2015-05-22 07:33:55)  

## 2016-09-22 LAB — TESTOSTERONE: Testosterone, Serum: 116 ng/dL — ABNORMAL LOW (ref 264–916)

## 2016-09-22 LAB — RETICULOCYTES: Reticulocyte Count: 0.6 % (ref 0.6–2.6)

## 2016-09-27 ENCOUNTER — Ambulatory Visit (HOSPITAL_COMMUNITY)
Admission: RE | Admit: 2016-09-27 | Discharge: 2016-09-27 | Disposition: A | Discharge status: Home / Self Care | Payer: Medicare Other | Source: Ambulatory Visit | Attending: Vascular Surgery | Admitting: Vascular Surgery

## 2016-09-27 ENCOUNTER — Ambulatory Visit (INDEPENDENT_AMBULATORY_CARE_PROVIDER_SITE_OTHER): Payer: Medicare Other | Admitting: Vascular Surgery

## 2016-09-27 ENCOUNTER — Encounter: Payer: Self-pay | Admitting: Vascular Surgery

## 2016-09-27 ENCOUNTER — Ambulatory Visit (INDEPENDENT_AMBULATORY_CARE_PROVIDER_SITE_OTHER)
Admission: RE | Admit: 2016-09-27 | Discharge: 2016-09-27 | Disposition: A | Discharge status: Home / Self Care | Payer: Medicare Other | Source: Ambulatory Visit | Attending: Vascular Surgery | Admitting: Vascular Surgery

## 2016-09-27 VITALS — BP 122/62 | HR 66 | Temp 97.0°F | Resp 16 | Ht 68.0 in | Wt 173.0 lb

## 2016-09-27 DIAGNOSIS — N185 Chronic kidney disease, stage 5: Secondary | ICD-10-CM

## 2016-09-27 NOTE — Progress Notes (Signed)
HISTORY AND PHYSICAL     CC:  In need of permanent dialysis access Requesting Provider:  Harvie Junior, MD  HPI: This is a 76 y.o. male who has CKD 5 and is referred for evaluation for permanent HD access.  He is not yet on dialysis.  He does have hypertension and takes a beta blocker and CCB.  He has diabetes and takes an oral agent.  He states that he does have some carotid disease that is followed by Dr. Einar Gip.  He is on an aspirin daily.  He states that he has spells of weakness occasionally and this does improve.  He has a hx of CHF.  He does have leg swelling, but denies and shortness of breath.  He has a hx of Non-Hodgkin's Lymphoma and has had a splenectomy 9 years ago.  He has not had any recurrence.    TTE revealed EF of 55-60%.  Per cardiology, he does have a systolic murmur and suspect moderate AS, however, there was no evidence of AS on TTE.  Carotid doppler at [facility] on 05/10/16 revealed 50-69% carotid artery stenosis on the right and 1-15% on the left.    Past Medical History:  Diagnosis Date  . Anemia of chronic renal failure, stage 4 (severe) ([place]) 09/06/2016  . Chronic kidney disease    "I see [name]" (03/07/2016)  . Chronic kidney disease, stage 5 ([place])   . 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 GERD (gastroesophageal reflux disease)   . Gout   . H/O exposure to tuberculosis   . History of kidney stones    "they passed" (03/07/2016)  . Hyperkalemia   . Hypertension   . Hypotestosteronism 03/29/2011  . Hypothyroidism   . Non Hodgkin's lymphoma ([place])   . Splenic marginal zone b-cell lymphoma ([place]) 03/29/2011  . Thyroid disease   . Type II diabetes mellitus ([place]) dx'd ~ 2005    Past Surgical History:  Procedure Laterality Date  . APPENDECTOMY  1948  . INGUINAL HERNIA REPAIR Right ~ 1948  . LAPAROTOMY  08/11/2011   Procedure: EXPLORATORY LAPAROTOMY;  Surgeon: Zenovia Jarred, MD;  Location: [place];  Service: General;  Laterality: N/A;  .  PANCREATECTOMY  2009   Partial w/ splenectomy  . perforation of duodenum repair     2013  . SPLENECTOMY, TOTAL  2009  . TONSILLECTOMY      Allergies  Allergen Reactions  . Iohexol Other (See Comments)    Pt states has history of reaction in the past, can not remember what exactly happened, but was told by the dr never to have the contrast again.  . Allopurinol Rash    Gout agents    Current Outpatient Prescriptions  Medication Sig Dispense Refill  . allopurinol (ZYLOPRIM) 100 MG tablet Take 100 mg by mouth daily.     Marland Kitchen aspirin EC 81 MG EC tablet Take 1 tablet (81 mg total) by mouth daily. 30 tablet   . atorvastatin (LIPITOR) 10 MG tablet Take 10 mg by mouth daily.    . carvedilol (COREG) 3.125 MG tablet Take 1 tablet (3.125 mg total) by mouth 2 (two) times daily with a meal. 60 tablet 1  . cephALEXin (KEFLEX) 250 MG capsule Take 1 capsule (250 mg total) by mouth 3 (three) times daily. 30 capsule 0  . doxazosin (CARDURA) 4 MG tablet Take 8 mg by mouth 2 (two) times daily. 2 tabs twice daily    . furosemide (LASIX) 80 MG tablet Take  80 mg by mouth.    . levothyroxine (SYNTHROID, LEVOTHROID) 175 MCG tablet Take 175 mcg by mouth daily before breakfast.    . methylPREDNISolone (MEDROL DOSEPAK) 4 MG TBPK tablet Take as directed on package. 21 tablet 0  . omeprazole (PRILOSEC) 20 MG capsule Take 20 mg by mouth 2 (two) times daily.     . pindolol (VISKEN) 10 MG tablet Take 10 mg by mouth daily.    . sitaGLIPtin (JANUVIA) 25 MG tablet Take 1 tablet (25 mg total) by mouth daily. 30 tablet 1  . triamcinolone ointment (KENALOG) 0.1 % Apply 1 application topically daily as needed (for irritation).      No current facility-administered medications for this visit.     Family History  Problem Relation Age of Onset  . Tuberculosis Father     Social History   Social History  . Marital status: Married    Spouse name: N/A  . Number of children: N/A  . Years of education: N/A    Occupational History  . retired    Social History Main Topics  . Smoking status: Never Smoker  . Smokeless tobacco: Never Used  . Alcohol use No  . Drug use: No  . Sexual activity: Not Currently   Other Topics Concern  . Not on file   Social History Narrative   Retired/disabled   Patient has a living will and a surrogate decision maker- Joyce Walston-wife     ROS: [x]  Positive   [ ]  Negative   [ ]  All sytems reviewed and are negative  Cardiac: []  chest pain/pressure []  palpitations []  SOB lying flat []  DOE [x]  hx CHF  Vascular: []  pain in legs while walking []  pain in feet when lying flat []  hx of DVT []  hx of phlebitis [x]  swelling in legs []  varicose veins [x]  carotid artery stenosis  Pulmonary: []  productive cough []  asthma []  wheezing  Neurologic: []  weakness in []  arms []  legs []  numbness in []  arms []  legs [] difficulty speaking or slurred speech []  temporary loss of vision in one eye []  dizziness  Hematologic: []  bleeding problems []  problems with blood clotting easily [x]  hx cancer [x]  anemia  GI []  vomiting blood []  blood in stool  GU: [x]  CKD/renal failure  []  HD---[]  M/W/F []  T/T/F []  burning with urination []  blood in urine  Psychiatric: []  hx of major depression  Integumentary: []  rashes [x]  ulcers  Constitutional: []  fever []  chills  PHYSICAL EXAMINATION:  Vitals:   09/27/16 1328  BP: 122/62  Pulse: 66  Resp: 16  Temp: 97 F (36.1 C)   Vitals:   09/27/16 1328  Weight: 173 lb (78.5 kg)  Height: 5\' 8"  (1.727 m)    General:  WDWN in NAD Gait: Normal HENT: WNL Pulmonary: normal non-labored breathing , without Rales, rhonchi,  wheezing Cardiac: regular, with 3/6 systolic murmur, rubs or gallops; with carotid bruit on the left Abdomen: soft, NT, no masses Skin: without rashes, without ulcers  Vascular Exam/Pulses:   Right Left  Radial 2+ (normal) 2+ (normal)  Ulnar 2+ (normal) 2+ (normal)   Extremities:   2+BLE edema Musculoskeletal: no muscle wasting or atrophy  Neurologic: A&O X 3; Moving all extremities equally;  Speech is fluent/normal   Non-Invasive Vascular Imaging:   Upper Extremity Vein Mapping on 09/27/16 Right Left   Cephalic Basilic  Cephalic Basilic  Diameter cm Diameter cm  Diameter cm Diameter cm  0.46  Shoulder 0.41   0.36  Upper Arm Proximal  0.52 0.35  0.27 0.24 Upper Arm Mid 0.37 0.38  0.32 NV Upper Arm Distal 0.37 0.34  0.34  Antecubital Fossa 0.45   0.25  Forearm proximal 0.37   0.26  Forearm mid 0.43   0.36  Forearm distal 0.46     Visible branches 0.22/0.28          Upper extremity arterial duplex prior to dialysis access 09/27/16: Right: Brachial:  0.43cm (T) Radial:  0.25cm (T) Ulnar:  0.16 (T)  Left:   Brachial:  0.58 (T) Radial:  0.22 (T) Ulnar:  0.17 (T)  ASSESSMENT/PLAN: 76 y.o. male with CKD 5 in need of permanent HD access   - the pt is right hand dominant.  He has excellent veins in the left arm.  Will plan for left radial cephalic AVF.  The risks/benefits are explained to the pt and they are given a handout.   -the pt would like to let his wife recover more from her knee surgery.  He will call and schedule his surgery when it is convenient.  -systolic murmur suspected to be moderate aortic stenosis followed by Dr. Einar Gip (TTE last year without evidence of AS per Dr. Meda Coffee). -carotid stenosis:   50-69% on the right followed by Dr. Einar Gip -generalized weakness-has been worked up by Dr. Einar Gip and he wore heart monitor and was normal per pt.   Leontine Locket, PA-C [facility] [phone]  Clinic MD:   Scot Dock  VASCULAR SURGERY ASSESSMENT & PLAN:   I have independently interpreted his vein map today. He appears to have a nice forearm cephalic vein for a radial cephalic fistula. I discussed the indications for placement of the fistula and the potential complications. He would like to think about this at home before scheduling  his surgery as his wife has had some knee problems recently. We will be happy to schedule him for a left radiocephalic fistula or brachial cephalic fistula as soon as he is ready.  Deitra Mayo, MD, [place] [phone] Office: [phone]

## 2016-09-29 DIAGNOSIS — N189 Chronic kidney disease, unspecified: Secondary | ICD-10-CM | POA: Diagnosis not present

## 2016-09-29 DIAGNOSIS — I10 Essential (primary) hypertension: Secondary | ICD-10-CM | POA: Diagnosis not present

## 2016-09-29 DIAGNOSIS — D630 Anemia in neoplastic disease: Secondary | ICD-10-CM | POA: Diagnosis not present

## 2016-09-29 DIAGNOSIS — I779 Disorder of arteries and arterioles, unspecified: Secondary | ICD-10-CM | POA: Diagnosis not present

## 2016-10-18 ENCOUNTER — Emergency Department (HOSPITAL_BASED_OUTPATIENT_CLINIC_OR_DEPARTMENT_OTHER): Payer: Medicare Other

## 2016-10-18 ENCOUNTER — Other Ambulatory Visit (HOSPITAL_BASED_OUTPATIENT_CLINIC_OR_DEPARTMENT_OTHER): Payer: Medicare Other

## 2016-10-18 ENCOUNTER — Observation Stay (HOSPITAL_BASED_OUTPATIENT_CLINIC_OR_DEPARTMENT_OTHER)
Admission: EM | Admit: 2016-10-18 | Discharge: 2016-10-20 | Disposition: A | Discharge status: Home / Self Care | Payer: Medicare Other | Attending: Nephrology | Admitting: Nephrology

## 2016-10-18 ENCOUNTER — Telehealth: Payer: Self-pay | Admitting: *Deleted

## 2016-10-18 ENCOUNTER — Ambulatory Visit: Payer: Medicare Other

## 2016-10-18 ENCOUNTER — Ambulatory Visit (HOSPITAL_BASED_OUTPATIENT_CLINIC_OR_DEPARTMENT_OTHER): Payer: Medicare Other | Admitting: Hematology & Oncology

## 2016-10-18 ENCOUNTER — Encounter (HOSPITAL_BASED_OUTPATIENT_CLINIC_OR_DEPARTMENT_OTHER): Payer: Self-pay

## 2016-10-18 VITALS — BP 114/46 | HR 70 | Temp 97.7°F | Resp 18 | Wt 164.0 lb

## 2016-10-18 DIAGNOSIS — D631 Anemia in chronic kidney disease: Secondary | ICD-10-CM

## 2016-10-18 DIAGNOSIS — Z9081 Acquired absence of spleen: Secondary | ICD-10-CM | POA: Diagnosis not present

## 2016-10-18 DIAGNOSIS — N183 Chronic kidney disease, stage 3 (moderate): Secondary | ICD-10-CM | POA: Diagnosis not present

## 2016-10-18 DIAGNOSIS — E349 Endocrine disorder, unspecified: Secondary | ICD-10-CM

## 2016-10-18 DIAGNOSIS — R531 Weakness: Secondary | ICD-10-CM | POA: Diagnosis not present

## 2016-10-18 DIAGNOSIS — I700 Atherosclerosis of aorta: Secondary | ICD-10-CM | POA: Diagnosis not present

## 2016-10-18 DIAGNOSIS — Z87442 Personal history of urinary calculi: Secondary | ICD-10-CM | POA: Diagnosis not present

## 2016-10-18 DIAGNOSIS — E291 Testicular hypofunction: Secondary | ICD-10-CM | POA: Diagnosis not present

## 2016-10-18 DIAGNOSIS — N1330 Unspecified hydronephrosis: Secondary | ICD-10-CM | POA: Insufficient documentation

## 2016-10-18 DIAGNOSIS — Z79899 Other long term (current) drug therapy: Secondary | ICD-10-CM | POA: Insufficient documentation

## 2016-10-18 DIAGNOSIS — M5134 Other intervertebral disc degeneration, thoracic region: Secondary | ICD-10-CM | POA: Insufficient documentation

## 2016-10-18 DIAGNOSIS — Z831 Family history of other infectious and parasitic diseases: Secondary | ICD-10-CM | POA: Insufficient documentation

## 2016-10-18 DIAGNOSIS — E875 Hyperkalemia: Secondary | ICD-10-CM | POA: Insufficient documentation

## 2016-10-18 DIAGNOSIS — Z8719 Personal history of other diseases of the digestive system: Secondary | ICD-10-CM | POA: Diagnosis not present

## 2016-10-18 DIAGNOSIS — I120 Hypertensive chronic kidney disease with stage 5 chronic kidney disease or end stage renal disease: Secondary | ICD-10-CM | POA: Diagnosis not present

## 2016-10-18 DIAGNOSIS — R7989 Other specified abnormal findings of blood chemistry: Secondary | ICD-10-CM

## 2016-10-18 DIAGNOSIS — C8307 Small cell B-cell lymphoma, spleen: Secondary | ICD-10-CM

## 2016-10-18 DIAGNOSIS — N400 Enlarged prostate without lower urinary tract symptoms: Secondary | ICD-10-CM | POA: Diagnosis not present

## 2016-10-18 DIAGNOSIS — N189 Chronic kidney disease, unspecified: Secondary | ICD-10-CM | POA: Diagnosis not present

## 2016-10-18 DIAGNOSIS — R21 Rash and other nonspecific skin eruption: Secondary | ICD-10-CM | POA: Diagnosis present

## 2016-10-18 DIAGNOSIS — D508 Other iron deficiency anemias: Secondary | ICD-10-CM

## 2016-10-18 DIAGNOSIS — N182 Chronic kidney disease, stage 2 (mild): Secondary | ICD-10-CM

## 2016-10-18 DIAGNOSIS — E119 Type 2 diabetes mellitus without complications: Secondary | ICD-10-CM

## 2016-10-18 DIAGNOSIS — Z90411 Acquired partial absence of pancreas: Secondary | ICD-10-CM | POA: Insufficient documentation

## 2016-10-18 DIAGNOSIS — I132 Hypertensive heart and chronic kidney disease with heart failure and with stage 5 chronic kidney disease, or end stage renal disease: Principal | ICD-10-CM | POA: Insufficient documentation

## 2016-10-18 DIAGNOSIS — Z8572 Personal history of non-Hodgkin lymphomas: Secondary | ICD-10-CM

## 2016-10-18 DIAGNOSIS — N172 Acute kidney failure with medullary necrosis: Secondary | ICD-10-CM

## 2016-10-18 DIAGNOSIS — N184 Chronic kidney disease, stage 4 (severe): Secondary | ICD-10-CM | POA: Insufficient documentation

## 2016-10-18 DIAGNOSIS — I5032 Chronic diastolic (congestive) heart failure: Secondary | ICD-10-CM | POA: Diagnosis not present

## 2016-10-18 DIAGNOSIS — K219 Gastro-esophageal reflux disease without esophagitis: Secondary | ICD-10-CM | POA: Diagnosis not present

## 2016-10-18 DIAGNOSIS — Z7982 Long term (current) use of aspirin: Secondary | ICD-10-CM | POA: Insufficient documentation

## 2016-10-18 DIAGNOSIS — E039 Hypothyroidism, unspecified: Secondary | ICD-10-CM | POA: Diagnosis not present

## 2016-10-18 DIAGNOSIS — M109 Gout, unspecified: Secondary | ICD-10-CM | POA: Diagnosis not present

## 2016-10-18 DIAGNOSIS — N19 Unspecified kidney failure: Secondary | ICD-10-CM | POA: Diagnosis not present

## 2016-10-18 DIAGNOSIS — Z9889 Other specified postprocedural states: Secondary | ICD-10-CM | POA: Diagnosis not present

## 2016-10-18 DIAGNOSIS — E1122 Type 2 diabetes mellitus with diabetic chronic kidney disease: Secondary | ICD-10-CM | POA: Diagnosis not present

## 2016-10-18 DIAGNOSIS — R110 Nausea: Secondary | ICD-10-CM | POA: Diagnosis not present

## 2016-10-18 DIAGNOSIS — Z888 Allergy status to other drugs, medicaments and biological substances status: Secondary | ICD-10-CM | POA: Insufficient documentation

## 2016-10-18 DIAGNOSIS — Z9049 Acquired absence of other specified parts of digestive tract: Secondary | ICD-10-CM | POA: Insufficient documentation

## 2016-10-18 DIAGNOSIS — C8590 Non-Hodgkin lymphoma, unspecified, unspecified site: Secondary | ICD-10-CM

## 2016-10-18 DIAGNOSIS — Z91041 Radiographic dye allergy status: Secondary | ICD-10-CM | POA: Insufficient documentation

## 2016-10-18 DIAGNOSIS — N179 Acute kidney failure, unspecified: Secondary | ICD-10-CM | POA: Diagnosis not present

## 2016-10-18 LAB — CBC WITH DIFFERENTIAL (CANCER CENTER ONLY)
BASO#: 0.1 10*3/uL (ref 0.0–0.2)
BASO%: 0.4 % (ref 0.0–2.0)
EOS%: 18.5 % — AB (ref 0.0–7.0)
Eosinophils Absolute: 2.2 10*3/uL — ABNORMAL HIGH (ref 0.0–0.5)
HEMATOCRIT: 33.5 % — AB (ref 38.7–49.9)
HGB: 11.6 g/dL — ABNORMAL LOW (ref 13.0–17.1)
LYMPH#: 1.1 10*3/uL (ref 0.9–3.3)
LYMPH%: 9.5 % — ABNORMAL LOW (ref 14.0–48.0)
MCH: 34.7 pg — ABNORMAL HIGH (ref 28.0–33.4)
MCHC: 34.6 g/dL (ref 32.0–35.9)
MCV: 100 fL — ABNORMAL HIGH (ref 82–98)
MONO#: 1.5 10*3/uL — ABNORMAL HIGH (ref 0.1–0.9)
MONO%: 12.3 % (ref 0.0–13.0)
NEUT#: 7.1 10*3/uL — ABNORMAL HIGH (ref 1.5–6.5)
NEUT%: 59.3 % (ref 40.0–80.0)
Platelets: 241 10*3/uL (ref 145–400)
RBC: 3.34 10*6/uL — ABNORMAL LOW (ref 4.20–5.70)
RDW: 13.4 % (ref 11.1–15.7)
WBC: 12 10*3/uL — ABNORMAL HIGH (ref 4.0–10.0)

## 2016-10-18 LAB — CHCC SATELLITE - SMEAR

## 2016-10-18 LAB — PROTIME-INR
INR: 1.11
PROTHROMBIN TIME: 14.4 s (ref 11.4–15.2)

## 2016-10-18 LAB — TSH: TSH: 0.147 u[IU]/mL — ABNORMAL LOW (ref 0.350–4.500)

## 2016-10-18 LAB — CMP (CANCER CENTER ONLY)
ALT(SGPT): 20 U/L (ref 10–47)
AST: 20 U/L (ref 11–38)
Albumin: 3.6 g/dL (ref 3.3–5.5)
Alkaline Phosphatase: 74 U/L (ref 26–84)
BUN, Bld: 136 mg/dL — ABNORMAL HIGH (ref 7–22)
CALCIUM: 8.9 mg/dL (ref 8.0–10.3)
CHLORIDE: 98 meq/L (ref 98–108)
CO2: 27 meq/L (ref 18–33)
Creat: 4.1 mg/dl (ref 0.6–1.2)
GLUCOSE: 148 mg/dL — AB (ref 73–118)
Potassium: 3.8 mEq/L (ref 3.3–4.7)
Sodium: 141 mEq/L (ref 128–145)
Total Bilirubin: 1 mg/dl (ref 0.20–1.60)
Total Protein: 7.4 g/dL (ref 6.4–8.1)

## 2016-10-18 LAB — IRON AND TIBC
%SAT: 33 % (ref 20–55)
IRON: 78 ug/dL (ref 42–163)
TIBC: 234 ug/dL (ref 202–409)
UIBC: 156 ug/dL (ref 117–376)

## 2016-10-18 LAB — GLUCOSE, CAPILLARY
GLUCOSE-CAPILLARY: 143 mg/dL — AB (ref 65–99)
GLUCOSE-CAPILLARY: 127 mg/dL — AB (ref 65–99)
Glucose-Capillary: 210 mg/dL — ABNORMAL HIGH (ref 65–99)

## 2016-10-18 LAB — FERRITIN: Ferritin: 153 ng/ml (ref 22–316)

## 2016-10-18 MED ORDER — INSULIN ASPART 100 UNIT/ML ~~LOC~~ SOLN
0.0000 [IU] | Freq: Three times a day (TID) | SUBCUTANEOUS | Status: DC
  Administered 2016-10-18: 1 [IU] via SUBCUTANEOUS
  Administered 2016-10-19: 2 [IU] via SUBCUTANEOUS
  Administered 2016-10-19: 3 [IU] via SUBCUTANEOUS
  Administered 2016-10-20: 2 [IU] via SUBCUTANEOUS

## 2016-10-18 MED ORDER — CARVEDILOL 3.125 MG PO TABS: 3.1250 mg | ORAL_TABLET | Freq: Two times a day (BID) | ORAL | Status: DC

## 2016-10-18 MED ORDER — SODIUM CHLORIDE 0.9 % IV BOLUS (SEPSIS)
500.0000 mL | Freq: Once | INTRAVENOUS | Status: AC
  Administered 2016-10-18: 500 mL via INTRAVENOUS

## 2016-10-18 MED ORDER — PINDOLOL 5 MG PO TABS
10.0000 mg | ORAL_TABLET | Freq: Every day | ORAL | Status: DC
  Administered 2016-10-19 – 2016-10-20 (×2): 10 mg via ORAL
  Filled 2016-10-18 (×2): qty 2

## 2016-10-18 MED ORDER — ATORVASTATIN CALCIUM 10 MG PO TABS
10.0000 mg | ORAL_TABLET | Freq: Every day | ORAL | Status: DC
  Administered 2016-10-18 – 2016-10-19 (×2): 10 mg via ORAL
  Filled 2016-10-18 (×2): qty 1

## 2016-10-18 MED ORDER — ASPIRIN EC 81 MG PO TBEC
81.0000 mg | DELAYED_RELEASE_TABLET | Freq: Every day | ORAL | Status: DC
  Administered 2016-10-19 – 2016-10-20 (×2): 81 mg via ORAL
  Filled 2016-10-18 (×2): qty 1

## 2016-10-18 MED ORDER — ACETAMINOPHEN 325 MG PO TABS: 650.0000 mg | ORAL_TABLET | Freq: Four times a day (QID) | ORAL | Status: DC | PRN

## 2016-10-18 MED ORDER — PANTOPRAZOLE SODIUM 40 MG PO TBEC
40.0000 mg | DELAYED_RELEASE_TABLET | Freq: Every day | ORAL | Status: DC
  Administered 2016-10-19 – 2016-10-20 (×2): 40 mg via ORAL
  Filled 2016-10-18 (×2): qty 1

## 2016-10-18 MED ORDER — ACETAMINOPHEN 650 MG RE SUPP: 650.0000 mg | Freq: Four times a day (QID) | RECTAL | Status: DC | PRN

## 2016-10-18 MED ORDER — DOXAZOSIN MESYLATE 2 MG PO TABS
8.0000 mg | ORAL_TABLET | Freq: Two times a day (BID) | ORAL | Status: DC
  Administered 2016-10-18 – 2016-10-20 (×4): 8 mg via ORAL
  Filled 2016-10-18 (×4): qty 4

## 2016-10-18 MED ORDER — HEPARIN SODIUM (PORCINE) 5000 UNIT/ML IJ SOLN
5000.0000 [IU] | Freq: Three times a day (TID) | INTRAMUSCULAR | Status: DC
  Administered 2016-10-18 – 2016-10-20 (×5): 5000 [IU] via SUBCUTANEOUS
  Filled 2016-10-18 (×5): qty 1

## 2016-10-18 MED ORDER — ONDANSETRON HCL 4 MG/2ML IJ SOLN
4.0000 mg | Freq: Four times a day (QID) | INTRAMUSCULAR | Status: DC | PRN
Start: 2016-10-18 — End: 2016-10-20

## 2016-10-18 MED ORDER — ALLOPURINOL 100 MG PO TABS
100.0000 mg | ORAL_TABLET | Freq: Every day | ORAL | Status: DC
  Administered 2016-10-18 – 2016-10-20 (×3): 100 mg via ORAL
  Filled 2016-10-18 (×3): qty 1

## 2016-10-18 MED ORDER — SODIUM CHLORIDE 0.9 % IV SOLN
INTRAVENOUS | Status: DC
  Administered 2016-10-18 – 2016-10-19 (×2): via INTRAVENOUS

## 2016-10-18 MED ORDER — ONDANSETRON HCL 4 MG PO TABS: 4.0000 mg | ORAL_TABLET | Freq: Four times a day (QID) | ORAL | Status: DC | PRN

## 2016-10-18 MED ORDER — LEVOTHYROXINE SODIUM 75 MCG PO TABS
175.0000 ug | ORAL_TABLET | Freq: Every day | ORAL | Status: DC
  Administered 2016-10-19: 175 ug via ORAL
  Filled 2016-10-18: qty 1

## 2016-10-18 MED ORDER — HYDROCODONE-ACETAMINOPHEN 5-325 MG PO TABS: 1.0000 | ORAL_TABLET | ORAL | Status: DC | PRN

## 2016-10-18 MED ORDER — TESTOSTERONE CYPIONATE 200 MG/ML IM SOLN
INTRAMUSCULAR | Status: AC
  Filled 2016-10-18: qty 2

## 2016-10-18 NOTE — H&P (Signed)
History and Physical    Larry [name] ZOX:[id number] DOB: [birth year] DOA: 10/18/2016  PCP: Aura Dials, MD  Patient coming from: Home  Chief Complaint: Generalized weakness  HPI: Larry [name] is a 76 y.o. 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 Patient sent from his oncologist office for further evaluation. Patient has a scheduled appointment with Dr. Marin Olp, he reported 17 pound loss, despite increased appetite, also had rash involving his arms and its raised in his abdomen, very erythematous. Reported generalized weakness, showed creatinine increased to 4.1 BUN of 136. Dr. Marin Olp sent him to the ED for evaluation of admission.  ED Course:  Vitals:  WNL  Labs:BUN 136 and creatinine 4.1  Imaging: CXR without congestion  Interventions: admission   Review of Systems:  Constitutional: Generalized weakness Eyes: negative for irritation, redness and visual disturbance Ears, nose, mouth, throat, and face: negative for earaches, epistaxis, nasal congestion and sore throat Respiratory: negative for cough, dyspnea on exertion, sputum and wheezing Cardiovascular: negative for chest pain, dyspnea, lower extremity edema, orthopnea, palpitations and syncope Gastrointestinal: negative for abdominal pain, constipation, diarrhea, melena, nausea and vomiting Genitourinary:negative for dysuria, frequency and hematuria Hematologic/lymphatic: negative for bleeding, easy bruising and lymphadenopathy Musculoskeletal:negative for arthralgias, muscle weakness and stiff joints Neurological: negative for coordination problems, gait problems, headaches and weakness Endocrine: negative for diabetic symptoms including polydipsia, polyuria and weight loss Allergic/Immunologic: negative for anaphylaxis, hay fever and urticaria  Past Medical History:  Diagnosis Date  . Anemia of chronic renal failure, stage  4 (severe) ([place]) 09/06/2016  . Chronic kidney disease    "I see [name]" (03/07/2016)  . Chronic kidney disease, stage 5 ([place])   . 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 GERD (gastroesophageal reflux disease)   . Gout   . H/O exposure to tuberculosis   . History of kidney stones    "they passed" (03/07/2016)  . Hyperkalemia   . Hypertension   . Hypotestosteronism 03/29/2011  . Hypothyroidism   . Non Hodgkin's lymphoma ([place])   . Splenic marginal zone b-cell lymphoma ([place]) 03/29/2011  . Thyroid disease   . Type II diabetes mellitus ([place]) dx'd ~ 2005    Past Surgical History:  Procedure Laterality Date  . APPENDECTOMY  1948  . INGUINAL HERNIA REPAIR Right ~ 1948  . LAPAROTOMY  08/11/2011   Procedure: EXPLORATORY LAPAROTOMY;  Surgeon: Zenovia Jarred, MD;  Location: [facility];  Service: General;  Laterality: N/A;  . PANCREATECTOMY  2009   Partial w/ splenectomy  . perforation of duodenum repair     2013  . SPLENECTOMY, TOTAL  2009  . TONSILLECTOMY       reports that he has never smoked. He has never used smokeless tobacco. He reports that he does not drink alcohol or use drugs.  Allergies  Allergen Reactions  . Iohexol Other (See Comments)    Pt states that she was told by MD not to ever have contrast again.    . Allopurinol Rash    Family History  Problem Relation Age of Onset  . Tuberculosis Father     Prior to Admission medications   Medication Sig Start Date End Date Taking? Authorizing Provider  allopurinol (ZYLOPRIM) 100 MG tablet Take 100 mg by mouth daily.     [provider]  aspirin EC 81 MG EC tablet Take 1 tablet (81 mg total) by mouth daily. 03/13/16   Orson Eva, MD  atorvastatin (LIPITOR) 10 MG tablet Take 10 mg by mouth daily.    [provider]  carvedilol (COREG) 3.125 MG tablet Take 1 tablet (3.125 mg total) by mouth 2 (two) times daily with a meal. 03/12/16   Tat, Shanon Brow, MD  doxazosin (CARDURA) 4 MG tablet Take 8 mg by mouth 2  (two) times daily.     [provider]  furosemide (LASIX) 80 MG tablet Take 80-160 mg by mouth 2 (two) times daily. Pt takes two tablets in the morning and one at bedtime.    [provider]  levothyroxine (SYNTHROID, LEVOTHROID) 175 MCG tablet Take 175 mcg by mouth daily before breakfast.    [provider]  omeprazole (PRILOSEC) 20 MG capsule Take 20 mg by mouth 2 (two) times daily.     [provider]  pindolol (VISKEN) 10 MG tablet Take 10 mg by mouth daily.    [provider]  sitaGLIPtin (JANUVIA) 25 MG tablet Take 1 tablet (25 mg total) by mouth daily. 03/12/16   Orson Eva, MD  triamcinolone ointment (KENALOG) 0.1 % Apply 1 application topically 2 (two) times daily as needed (for irritation).     [provider]    Physical Exam:  Vitals:   10/18/16 1108 10/18/16 1316 10/18/16 1435  BP: (!) 137/94 (!) 120/59 (!) 154/56  Pulse: 72 70 80  Resp: 18 17 14   Temp: 97.8 F (36.6 C)  97.6 F (36.4 C)  TempSrc: Oral  Oral  SpO2: 98% 96% 97%  Weight:   71.9 kg (158 lb 8.2 oz)  Height:   5\' 8"  (1.727 m)    Constitutional: NAD, calm, comfortable Eyes: PERRL, lids and conjunctivae normal ENMT: Mucous membranes are moist. Posterior pharynx clear of any exudate or lesions.Normal dentition.  Neck: normal, supple, no masses, no thyromegaly Respiratory: clear to auscultation bilaterally, no wheezing, no crackles. Normal respiratory effort. No accessory muscle use.  Cardiovascular: Regular rate and rhythm, no murmurs / rubs / gallops. No extremity edema. 2+ pedal pulses. No carotid bruits.  Abdomen: no tenderness, no masses palpated. No hepatosplenomegaly. Bowel sounds positive.  Musculoskeletal: no clubbing / cyanosis. No joint deformity upper and lower extremities. Good ROM, no contractures. Normal muscle tone.  Skin: no rashes, lesions, ulcers. No induration Neurologic: CN 2-12 grossly intact. Sensation intact, DTR normal. Strength 5/5  in all 4.  Psychiatric: Normal judgment and insight. Alert and oriented x 3. Normal mood.   Labs on Admission: I have personally reviewed following labs and imaging studies  CBC:  Recent Labs Lab 10/18/16 0932  WBC 12.0*  NEUTROABS 7.1*  HGB 11.6*  HCT 33.5*  MCV 100*  PLT 833   Basic Metabolic Panel:  Recent Labs Lab 10/18/16 0932  NA 141  K 3.8  CL 98  CO2 27  GLUCOSE 148*  BUN 136*  CREATININE 4.1*  CALCIUM 8.9   GFR: Estimated Creatinine Clearance: 14.8 mL/min (A) (by C-G formula based on SCr of 4.1 mg/dL [facility])). Liver Function Tests:  Recent Labs Lab 10/18/16 0932  AST 20  ALT 20  ALKPHOS 74  BILITOT 1.00  PROT 7.4  ALBUMIN 3.6   No results for input(s): LIPASE, AMYLASE in the last 168 hours. No results for input(s): AMMONIA in the last 168 hours. Coagulation Profile: No results for input(s): INR, PROTIME in the last 168 hours. Cardiac Enzymes: No results for input(s): CKTOTAL, CKMB, CKMBINDEX, TROPONINI in the last 168 hours. BNP (last 3 results) No results for input(s): PROBNP in  the last 8760 hours. HbA1C: No results for input(s): HGBA1C in the last 72 hours. CBG:  Recent Labs Lab 10/18/16 1438  GLUCAP 143*   Lipid Profile: No results for input(s): CHOL, HDL, LDLCALC, TRIG, CHOLHDL, LDLDIRECT in the last 72 hours. Thyroid Function Tests: No results for input(s): TSH, T4TOTAL, FREET4, T3FREE, THYROIDAB in the last 72 hours. Anemia Panel:  Recent Labs  10/18/16 0932  FERRITIN 153  TIBC 234  IRON 78   Urine analysis:    Component Value Date/Time   COLORURINE YELLOW 03/07/2016 1535   APPEARANCEUR CLEAR 03/07/2016 1535   LABSPEC 1.015 03/07/2016 1535   PHURINE 6.0 03/07/2016 1535   GLUCOSEU NEGATIVE 03/07/2016 1535   HGBUR SMALL (A) 03/07/2016 1535   BILIRUBINUR NEGATIVE 03/07/2016 1535   KETONESUR NEGATIVE 03/07/2016 1535   PROTEINUR >300 (A) 03/07/2016 1535   UROBILINOGEN 0.2 08/11/2011 1050   NITRITE NEGATIVE 03/07/2016 1535    LEUKOCYTESUR NEGATIVE 03/07/2016 1535   Sepsis Labs: !!!!!!!!!!!!!!!!!!!!!!!!!!!!!!!!!!!!!!!!!!!! Invalid input(s): PROCALCITONIN, LACTICIDVEN No results found for this or any previous visit (from the past 240 hour(s)).   Radiological Exams on Admission: Dg Chest 2 View  Result Date: 10/18/2016 CLINICAL DATA:  Weakness for the past 2 weeks.  Nonsmoker. EXAM: CHEST  2 VIEW COMPARISON:  Chest x-ray of March 07, 2016 FINDINGS: The lungs are adequately inflated. There is subtle increased density that projects in the right upper lobe at or just below the first costosternal junction. This is new since the previous study. There is no pleural effusion. The heart and pulmonary vascularity are normal. The mediastinum is normal in width. There is multilevel degenerative disc disease of the thoracic spine. IMPRESSION: Increased density in the right upper lobe possibly related to overlap of bony structures, but it is worrisome for infiltrate or a parenchymal mass. Further evaluation with chest CT scanning would be useful to exclude malignancy. Elsewhere no acute intrathoracic abnormality is observed. Electronically Signed   By: David  Martinique M.D.   On: 10/18/2016 12:28    EKG: Independently reviewed.   Assessment/Plan Principal Problem:   General weakness Active Problems:   Diabetes mellitus ([place])   Acute kidney injury superimposed on CKD ([facility])   Uremia   Skin rash    Generalized weakness -Patient admitted for observation, mentioned generalized weakness and recent weight loss. -This could be secondary to uremia, he has rash have to rule out recurrence of his lymphoma. -PT to evaluate and treat.  Acute kidney injury superimposed CKD stage IV -Creatinine was 3.9 on 5/24, increased to 4.1 but BUN jumped from 89 up to 136. -Discussed briefly with Dr. Justin Mend, will hold Lasix gently hydrate with IV fluids. -If his BUN/creatinine not improving he will need to be transferred to [facility] for consideration  of hemodialysis.  Skin rash -Erythematous maculopapular rash, involving his torso and extremities. -Patient has history of splenic marginal zone lymphoma, need to rule out recurrent lymphoma. -Need to rule out cutaneous lymphomas like Sezary syndrome, spoke with [facility] for skin biopsy in a.m. -He will be seen by Dr. Marin Olp in the morning.  Uremia -Patient mentioned generalized weakness, irritability and general feeling of being unwell. -As mentioned above. The Lasix, hydrate with IV fluids check BMP in a.m.   DVT prophylaxis: SQ Heparin Code Status: Full Code Family Communication: Plan D/W patient Disposition Plan: Home Consults called:  Admission status: Observation   Amaan Meyer A MD [facility] Pager [phone]  If 7PM-7AM, please contact night-coverage [email or web address] Password [email or web address]  10/18/2016, 4:21 PM

## 2016-10-18 NOTE — ED Notes (Signed)
Patient transported to X-ray 

## 2016-10-18 NOTE — Progress Notes (Signed)
Hematology and Oncology Follow Up Visit  Larry [name] [id number] [birth year] 76 y.o. 10/18/2016   Principle Diagnosis:  1. 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 Chronic renal insufficiency 4. Anemia of renal insufficiency  Current Therapy:   Testosterone 400 mg IM once a month  Aranesp 300 g subcutaneous every 4 weeks for hemoglobin less than 10   Interim History:  Larry [name] is here today with his wife for follow-up. He is definitely not doing well. He has lost about 17 pounds since we last saw him. He is not eating. He just feels miserable.  His wife is very worried. She really cannot take care of him right now.  He's had no fever. He's had no bleeding.  This rash on his abdomen that appears to be worsening.  He's had no diarrhea. He says he is urinating but not all that much.  Back in late May, his ferritin was 184 with iron saturation of 28%. His testosterone level was 116. We give him supplemental testosterone and this does help his quality of life.  Currently, his performance status is ECOG 3.    Medications:  Allergies as of 10/18/2016      Reactions   Iohexol Other (See Comments)   Pt states has history of reaction in the past, can not remember what exactly happened, but was told by the dr never to have the contrast again.   Allopurinol Rash   Gout agents      Medication List       Accurate as of 10/18/16  9:47 AM. Always use your most recent med list.          allopurinol 100 MG tablet Commonly known as:  ZYLOPRIM Take 100 mg by mouth daily.   aspirin 81 MG EC tablet Take 1 tablet (81 mg total) by mouth daily.   atorvastatin 10 MG tablet Commonly known as:  LIPITOR Take 10 mg by mouth daily.   carvedilol 3.125 MG tablet Commonly known as:  COREG Take 1 tablet (3.125 mg total) by mouth 2 (two) times daily with a meal.   cephALEXin 250 MG capsule Commonly known as:  KEFLEX Take 1 capsule (250 mg  total) by mouth 3 (three) times daily.   doxazosin 4 MG tablet Commonly known as:  CARDURA Take 8 mg by mouth 2 (two) times daily. 2 tabs twice daily   furosemide 80 MG tablet Commonly known as:  LASIX Take 80 mg by mouth.   levothyroxine 175 MCG tablet Commonly known as:  SYNTHROID, LEVOTHROID Take 175 mcg by mouth daily before breakfast.   methylPREDNISolone 4 MG Tbpk tablet Commonly known as:  MEDROL DOSEPAK Take as directed on package.   omeprazole 20 MG capsule Commonly known as:  PRILOSEC Take 20 mg by mouth 2 (two) times daily.   pindolol 10 MG tablet Commonly known as:  VISKEN Take 10 mg by mouth daily.   sitaGLIPtin 25 MG tablet Commonly known as:  JANUVIA Take 1 tablet (25 mg total) by mouth daily.   triamcinolone ointment 0.1 % Commonly known as:  KENALOG Apply 1 application topically daily as needed (for irritation).       Allergies:  Allergies  Allergen Reactions  . Iohexol Other (See Comments)    Pt states has history of reaction in the past, can not remember what exactly happened, but was told by the dr never to have the contrast again.  . Allopurinol Rash    Gout agents  Past Medical History, Surgical history, Social history, and Family History were reviewed and updated.  Review of Systems: All other 10 point review of systems is negative.   Physical Exam:  vitals were not taken for this visit.  Wt Readings from Last 3 Encounters:  09/27/16 173 lb (78.5 kg)  09/21/16 169 lb 6.4 oz (76.8 kg)  09/06/16 181 lb (82.1 kg)    Thin somewhat disheveled white male in no obvious distress. Head and neck exam shows no ocular or oral lesions. He has no scleral icterus. There is no palpable cervical or supraclavicular lymph nodes. Lungs are clear bilaterally. Cardiac exam regular rate and rhythm with no with a 2/6 systolic ejection murmur. Abdomen is soft. Has a splint to be scar. Has a laparotomy scar in the midline. A fluid wave is noted. There is no  guarding or rebound tenderness. There is no palpable hepatomegaly. Extremities shows 3+ chronic edema in his lower legs. Skin exam shows some actinic keratoses. He has his large papular type rash on his abdominal wall. Neurological exam shows no focal neurological deficits. ferred  Lab Results  Component Value Date   WBC 12.0 (H) 10/18/2016   HGB 11.6 (L) 10/18/2016   HCT 33.5 (L) 10/18/2016   MCV 100 (H) 10/18/2016   PLT 241 10/18/2016   Lab Results  Component Value Date   FERRITIN 184 09/21/2016   IRON 63 09/21/2016   TIBC 223 09/21/2016   UIBC 160 09/21/2016   IRONPCTSAT 28 09/21/2016   Lab Results  Component Value Date   RETICCTPCT 0.9 03/29/2011   RBC 3.34 (L) 10/18/2016   RETICCTABS 31.8 03/29/2011   No results found for: KPAFRELGTCHN, LAMBDASER, KAPLAMBRATIO No results found for: Kandis Cocking, [facility] Lab Results  Component Value Date   TOTALPROTELP 6.7 07/14/2011   ALBUMINELP 56.6 07/14/2011   A1GS 5.7 (H) 07/14/2011   A2GS 12.7 (H) 07/14/2011   BETS 4.9 07/14/2011   BETA2SER 3.8 07/14/2011   GAMS 16.3 07/14/2011   MSPIKE NOT DET 07/14/2011   SPEI * 07/14/2011     Chemistry      Component Value Date/Time   NA 135 09/21/2016 0923   NA 142 02/15/2016 0953   K 3.7 09/21/2016 0923   K 4.4 02/15/2016 0953   CL 94 (L) 09/21/2016 0923   CO2 27 09/21/2016 0923   CO2 23 02/15/2016 0953   BUN 89 (H) 09/21/2016 0923   BUN 47.4 (H) 02/15/2016 0953   CREATININE 3.9 (HH) 09/21/2016 0923   CREATININE 2.6 (H) 02/15/2016 0953      Component Value Date/Time   CALCIUM 9.0 09/21/2016 0923   CALCIUM 9.1 02/15/2016 0953   ALKPHOS 96 (H) 09/21/2016 0923   ALKPHOS 99 02/15/2016 0953   AST 21 09/21/2016 0923   AST 16 02/15/2016 0953   ALT 21 09/21/2016 0923   ALT 7 02/15/2016 0953   BILITOT 0.50 09/21/2016 0923   BILITOT 0.93 02/15/2016 0953      Impression and Plan: Larry [name] is a  76 yo caucasian male with hypotestosteronemia. He also has history of splenic  marginal zone lymphoma with splenectomy in 2009. He then completed 1 cycle of Rituxan in April 2010. So far he has done well and there has been no recurrence.   It is apparent that his renal function is declining quickly. His BUN is up incredibly high. Again, I think that he probably is going to have to embark upon dialysis.  I don't see any obvious bleeding no because  the BUN to be elevated. He might be very dehydrated.  He has this anterior abdominal wall rash. This will need to be biopsied.  I think he is going to have to be admitted. I talked to him at length about this. He is not happy at all to have to go to the hospital. However, I told him that if he did not have his kidney function corrected, that I would not think that he would make it through this month. He understood this. He is going to have to think about doing dialysis.   I told him that I would hate to see him pass on with a situation that can be reversible. Granted, he probably will need dialysis. However, it is possible that he can just be supported without dialysis and get better and feel better.   For right now, I spoke to the ER physician. He will accept Larry [name] and get him admitted.   I'll plan to see him in the hospital.   I spent about 40 minutes with him today.  Volanda Napoleon, MD 6/20/20189:47 AM

## 2016-10-18 NOTE — ED Provider Notes (Signed)
[facility] DEPT [facility] Provider Note   CSN: [id number] Arrival date & time: 10/18/16  1058     History   Chief Complaint Chief Complaint  Patient presents with  . Abnormal Lab    HPI Larry [name] is a 76 y.o. male.  Patient is a 76 year old male with past medical history of non-Hodgkin's lymphoma status post splenectomy, anemia, and chronic renal insufficiency approaching dialysis. He was sent here from his oncologist's office for further evaluation of abnormal renal function. The patient reports to me he has felt fatigued for the past several weeks, "like he just ran a marathon". He denies any specific aches or pains, just fatigue. He denies any fevers or chills. He denies any decreased urine output or other bowel or bladder complaints. He was found to have creatinine increasing from 3.5 up to 4.1 along with a BUN which is increased from 75 to 130.      Past Medical History:  Diagnosis Date  . Anemia of chronic renal failure, stage 4 (severe) ([place]) 09/06/2016  . Chronic kidney disease    "I see [name]" (03/07/2016)  . Chronic kidney disease, stage 5 ([place])   . 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 GERD (gastroesophageal reflux disease)   . Gout   . H/O exposure to tuberculosis   . History of kidney stones    "they passed" (03/07/2016)  . Hyperkalemia   . Hypertension   . Hypotestosteronism 03/29/2011  . Hypothyroidism   . Non Hodgkin's lymphoma ([place])   . 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 Thyroid disease   . Type II diabetes mellitus ([place]) dx'd ~ 2005    Patient Active Problem List   Diagnosis Date Noted  . Chronic kidney disease, stage 5 ([place])   . Anemia of chronic renal failure, stage 4 (severe) ([place]) 09/06/2016  . 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 Acute diastolic CHF (congestive heart failure) ([place]) 03/10/2016  .  Cellulitis, neck 03/09/2016  . Acute congestive heart failure ([place])   . Pressure injury of skin 03/08/2016  . Acute renal failure superimposed on stage 4 chronic kidney disease ([place]) 03/08/2016  . CHF (congestive heart failure) ([place]) 03/07/2016  . Elevated troponin 03/07/2016  . Acute kidney injury superimposed on CKD ([place]) 03/07/2016  . Fever 03/07/2016  . Urinary retention s/p foley x2 08/19/2011  . Acute renal failure ([place]) 08/19/2011  . Chronic renal insufficiency, stage II (mild) 08/19/2011  . Duodenal ulcer, perforated, s/p Omental patch [id number] 08/11/2011  . 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 Hypotestosteronism 03/29/2011  . Diabetes mellitus ([place]) 08/28/2007  . Essential hypertension 08/28/2007  . ALLERGIC RHINITIS 08/28/2007  . PLEURAL EFFUSION, LEFT 08/28/2007    Past Surgical History:  Procedure Laterality Date  . APPENDECTOMY  1948  . INGUINAL HERNIA REPAIR Right ~ 1948  . LAPAROTOMY  08/11/2011   Procedure: EXPLORATORY LAPAROTOMY;  Surgeon: Zenovia Jarred, MD;  Location: [facility];  Service: General;  Laterality: N/A;  . PANCREATECTOMY  2009   Partial w/ splenectomy  . perforation of duodenum repair     2013  . SPLENECTOMY, TOTAL  2009  . TONSILLECTOMY         Home Medications    Prior to Admission medications   Medication Sig Start Date End Date Taking? Authorizing Provider  allopurinol (ZYLOPRIM) 100 MG tablet Take 100 mg by mouth daily.  10/20/14  [provider]  aspirin EC 81 MG EC tablet Take 1 tablet (81 mg total) by mouth daily. 03/13/16   Orson Eva, MD  atorvastatin (LIPITOR) 10 MG tablet Take 10 mg by mouth daily.    [provider]  carvedilol (COREG) 3.125 MG tablet Take 1 tablet (3.125 mg total) by mouth 2 (two) times daily with a meal. 03/12/16   Tat, Shanon Brow, MD  cephALEXin (KEFLEX) 250 MG capsule Take 1 capsule (250 mg total) by mouth 3 (three) times daily. 08/23/16   [place], Holli Humbles, NP  doxazosin  (CARDURA) 4 MG tablet Take 8 mg by mouth 2 (two) times daily. 2 tabs twice daily 10/20/14   [provider]  furosemide (LASIX) 80 MG tablet Take 80 mg by mouth.    [provider]  levothyroxine (SYNTHROID, LEVOTHROID) 175 MCG tablet Take 175 mcg by mouth daily before breakfast.    [provider]  methylPREDNISolone (MEDROL DOSEPAK) 4 MG TBPK tablet Take as directed on package. 08/23/16   [place], Holli Humbles, NP  omeprazole (PRILOSEC) 20 MG capsule Take 20 mg by mouth 2 (two) times daily.  11/27/11   [provider]  pindolol (VISKEN) 10 MG tablet Take 10 mg by mouth daily.    [provider]  sitaGLIPtin (JANUVIA) 25 MG tablet Take 1 tablet (25 mg total) by mouth daily. 03/12/16   Orson Eva, MD  triamcinolone ointment (KENALOG) 0.1 % Apply 1 application topically daily as needed (for irritation).  05/09/15   [provider]    Family History Family History  Problem Relation Age of Onset  . Tuberculosis Father     Social History Social History  Substance Use Topics  . Smoking status: Never Smoker  . Smokeless tobacco: Never Used  . Alcohol use No     Allergies   Iohexol and Allopurinol   Review of Systems Review of Systems  Constitutional: Positive for fatigue. Negative for fever.  All other systems reviewed and are negative.    Physical Exam Updated Vital Signs BP (!) 137/94 (BP Location: Right Arm)   Pulse 72   Temp 97.8 F (36.6 C) (Oral)   Resp 18   SpO2 98%   Physical Exam  Constitutional: He is oriented to person, place, and time. He appears well-developed and well-nourished. No distress.  HENT:  Head: Normocephalic and atraumatic.  Mouth/Throat: Oropharynx is clear and moist.  Neck: Normal range of motion. Neck supple.  Cardiovascular: Normal rate and regular rhythm.  Exam reveals no friction rub.   No murmur heard. Pulmonary/Chest: Effort normal and breath sounds normal. No respiratory distress. He has no  wheezes. He has no rales.  Abdominal: Soft. Bowel sounds are normal. He exhibits no distension. There is no tenderness.  Musculoskeletal: Normal range of motion. He exhibits no edema.  Lymphadenopathy:    He has no cervical adenopathy.  Neurological: He is alert and oriented to person, place, and time. No cranial nerve deficit. He exhibits normal muscle tone. Coordination normal.  Skin: Skin is warm and dry. He is not diaphoretic.  Nursing note and vitals reviewed.    ED Treatments / Results  Labs (all labs ordered are listed, but only abnormal results are displayed) Labs Reviewed - No data to display  EKG  EKG Interpretation None       Radiology No results found.  Procedures Procedures (including critical care time)  Medications Ordered in ED Medications - No data to display   Initial Impression / Assessment and  Plan / ED Course  I have reviewed the triage vital signs and the nursing notes.  Pertinent labs & imaging results that were available during my care of the patient were reviewed by me and considered in my medical decision making (see chart for details).  Patient sent for admission by Dr. Marin Olp.  His laboratory studies reveal worsening renal function with azotemia. According to the wife, the patient has been more unstable on his feet, less energetic, and emotionally labile.  I have discussed the care with Dr. Hollie Salk from nephrology who does not feel as though the patient necessarily requires admission from a renal standpoint, and is recommending holding Lasix and gentle hydration.  He also has a new rash, the etiology of which I am uncertain. It is Dr. Antonieta Pert concern that this may be a recurrence of his non-Hodgkin's lymphoma. He would like this area to be biopsied and for the patient to be admitted to the hospital.  When speaking with the wife outside of the hearing range of the patient, it sounds as if he is much more encephalopathic than what he is trying  to let on. She tells me he is unsteady on his feet, confused, and not behaving as his normal self. She feels very uncomfortable with him being home.  Final Clinical Impressions(s) / ED Diagnoses   Final diagnoses:  None    New Prescriptions New Prescriptions   No medications on file     Veryl Speak, MD 10/18/16 1316

## 2016-10-18 NOTE — ED Triage Notes (Signed)
Pt states he was sent from MD for abnormal kidney function tests-pt NAD-presents to triage in w/c

## 2016-10-18 NOTE — Telephone Encounter (Signed)
Critical Value Creatinine 4.1 Dr Ennever notified. No orders at this time.  

## 2016-10-18 NOTE — ED Notes (Signed)
ED Provider at bedside. 

## 2016-10-19 ENCOUNTER — Observation Stay (HOSPITAL_COMMUNITY): Payer: Medicare Other

## 2016-10-19 DIAGNOSIS — I132 Hypertensive heart and chronic kidney disease with heart failure and with stage 5 chronic kidney disease, or end stage renal disease: Secondary | ICD-10-CM | POA: Diagnosis not present

## 2016-10-19 DIAGNOSIS — R7989 Other specified abnormal findings of blood chemistry: Secondary | ICD-10-CM | POA: Diagnosis not present

## 2016-10-19 DIAGNOSIS — R799 Abnormal finding of blood chemistry, unspecified: Secondary | ICD-10-CM | POA: Diagnosis not present

## 2016-10-19 DIAGNOSIS — R634 Abnormal weight loss: Secondary | ICD-10-CM

## 2016-10-19 DIAGNOSIS — R631 Polydipsia: Secondary | ICD-10-CM

## 2016-10-19 DIAGNOSIS — E860 Dehydration: Secondary | ICD-10-CM

## 2016-10-19 DIAGNOSIS — Z8572 Personal history of non-Hodgkin lymphomas: Secondary | ICD-10-CM

## 2016-10-19 DIAGNOSIS — N1330 Unspecified hydronephrosis: Secondary | ICD-10-CM | POA: Diagnosis not present

## 2016-10-19 DIAGNOSIS — N189 Chronic kidney disease, unspecified: Secondary | ICD-10-CM

## 2016-10-19 DIAGNOSIS — Z9081 Acquired absence of spleen: Secondary | ICD-10-CM | POA: Diagnosis not present

## 2016-10-19 DIAGNOSIS — R531 Weakness: Secondary | ICD-10-CM | POA: Diagnosis not present

## 2016-10-19 DIAGNOSIS — N179 Acute kidney failure, unspecified: Secondary | ICD-10-CM | POA: Diagnosis not present

## 2016-10-19 DIAGNOSIS — I509 Heart failure, unspecified: Secondary | ICD-10-CM | POA: Diagnosis not present

## 2016-10-19 DIAGNOSIS — L404 Guttate psoriasis: Secondary | ICD-10-CM | POA: Diagnosis not present

## 2016-10-19 DIAGNOSIS — L309 Dermatitis, unspecified: Secondary | ICD-10-CM | POA: Diagnosis not present

## 2016-10-19 DIAGNOSIS — R21 Rash and other nonspecific skin eruption: Secondary | ICD-10-CM | POA: Diagnosis not present

## 2016-10-19 LAB — GLUCOSE, CAPILLARY
GLUCOSE-CAPILLARY: 113 mg/dL — AB (ref 65–99)
GLUCOSE-CAPILLARY: 218 mg/dL — AB (ref 65–99)
Glucose-Capillary: 114 mg/dL — ABNORMAL HIGH (ref 65–99)
Glucose-Capillary: 172 mg/dL — ABNORMAL HIGH (ref 65–99)

## 2016-10-19 LAB — RETICULOCYTES: RETICULOCYTE COUNT: 1.2 % (ref 0.6–2.6)

## 2016-10-19 LAB — BASIC METABOLIC PANEL
Anion gap: 15 (ref 5–15)
BUN: 133 mg/dL — ABNORMAL HIGH (ref 6–20)
CHLORIDE: 101 mmol/L (ref 101–111)
CO2: 27 mmol/L (ref 22–32)
CREATININE: 3.69 mg/dL — AB (ref 0.61–1.24)
Calcium: 8.3 mg/dL — ABNORMAL LOW (ref 8.9–10.3)
GFR, EST AFRICAN AMERICAN: 17 mL/min — AB (ref >60)
GFR, EST NON AFRICAN AMERICAN: 15 mL/min — AB (ref >60)
Glucose, Bld: 108 mg/dL — ABNORMAL HIGH (ref 65–99)
Potassium: 4 mmol/L (ref 3.5–5.1)
SODIUM: 143 mmol/L (ref 135–145)

## 2016-10-19 LAB — CBC
HCT: 30.5 % — ABNORMAL LOW (ref 39.0–52.0)
Hemoglobin: 10.4 g/dL — ABNORMAL LOW (ref 13.0–17.0)
MCH: 33.2 pg (ref 26.0–34.0)
MCHC: 34.1 g/dL (ref 30.0–36.0)
MCV: 97.4 fL (ref 78.0–100.0)
PLATELETS: 236 10*3/uL (ref 150–400)
RBC: 3.13 MIL/uL — AB (ref 4.22–5.81)
RDW: 14.3 % (ref 11.5–15.5)
WBC: 12.5 10*3/uL — AB (ref 4.0–10.5)

## 2016-10-19 LAB — HEMOGLOBIN A1C
Hgb A1c MFr Bld: 7.3 % — ABNORMAL HIGH (ref 4.8–5.6)
Mean Plasma Glucose: 163 mg/dL

## 2016-10-19 LAB — TESTOSTERONE: TESTOSTERONE: 174 ng/dL — AB (ref 264–916)

## 2016-10-19 MED ORDER — DARBEPOETIN ALFA 300 MCG/0.6ML IJ SOSY
300.0000 ug | PREFILLED_SYRINGE | Freq: Once | INTRAMUSCULAR | Status: AC
  Administered 2016-10-19: 300 ug via SUBCUTANEOUS
  Filled 2016-10-19: qty 0.6

## 2016-10-19 MED ORDER — BACITRACIN ZINC 500 UNIT/GM EX OINT
TOPICAL_OINTMENT | Freq: Once | CUTANEOUS | Status: AC
  Administered 2016-10-19: 13:00:00 via TOPICAL
  Filled 2016-10-19: qty 0.9

## 2016-10-19 MED ORDER — LIDOCAINE-EPINEPHRINE (PF) 2 %-1:200000 IJ SOLN
10.0000 mL | Freq: Once | INTRAMUSCULAR | Status: AC
  Administered 2016-10-19: 10 mL via INTRADERMAL
  Filled 2016-10-19: qty 10

## 2016-10-19 MED ORDER — LEVOTHYROXINE SODIUM 75 MCG PO TABS
150.0000 ug | ORAL_TABLET | Freq: Every day | ORAL | Status: DC
  Administered 2016-10-20: 150 ug via ORAL
  Filled 2016-10-19: qty 2

## 2016-10-19 NOTE — Evaluation (Signed)
Physical Therapy One Time Evaluation Patient Details Name: Larry [name] MRN: [id number] DOB: 09/05/1940 Today's Date: 10/19/2016   History of Present Illness  76 y.o. male with medical history significant of history of a splenic marginal zone lymphoma (status post splenectomy), CKD stage 3-4 and anemia and admitted for generalized weakness and rash  Clinical Impression  Patient evaluated by Physical Therapy with no further acute PT needs identified. All education has been completed and the patient has no further questions.   Pt tolerated mobility well.  No LOB observed and pt denies hx of falls however does have wide BOS and shuffling type gait pattern.  Recommend HHPT for further assessment if pt agreeable.  Pt has RW and SPC to use at home if needed.  Pt anticipates further testing today and likely d/c home tomorrow. PT is signing off. Thank you for this referral.     Follow Up Recommendations Home health PT    Equipment Recommendations  None recommended by PT    Recommendations for Other Services       Precautions / Restrictions Precautions Precautions: None Restrictions Weight Bearing Restrictions: No      Mobility  Bed Mobility Overal bed mobility: Modified Independent                Transfers Overall transfer level: Needs assistance Equipment used: None Transfers: Sit to/from Stand Sit to Stand: Min guard;Supervision         General transfer comment: min/guard for safety, slow to rise  Ambulation/Gait Ambulation/Gait assistance: Min guard;Supervision Ambulation Distance (Feet): 400 Feet Assistive device: None Gait Pattern/deviations: Wide base of support;Shuffle;Step-through pattern Gait velocity: decreased   General Gait Details: small shuffling steps, pt reports gait at baseline however he is typically faster, slow but steady pace today  Stairs            Wheelchair Mobility    Modified Rankin (Stroke Patients Only)       Balance  Overall balance assessment:  (denies falls)                                           Pertinent Vitals/Pain Pain Assessment: No/denies pain    Home Living Family/patient expects to be discharged to:: Private residence Living Arrangements: Spouse/significant other Available Help at Discharge: Family;Available PRN/intermittently Type of Home: House Home Access: Stairs to enter Entrance Stairs-Rails: Right;Left;Can reach both Entrance Stairs-Number of Steps: 5 Home Layout: One level Home Equipment: Walker - 2 wheels;Cane - single point      Prior Function Level of Independence: Independent               Hand Dominance        Extremity/Trunk Assessment        Lower Extremity Assessment Lower Extremity Assessment: Generalized weakness    Cervical / Trunk Assessment Cervical / Trunk Assessment: Kyphotic  Communication   Communication: No difficulties  Cognition Arousal/Alertness: Awake/alert Behavior During Therapy: WFL for tasks assessed/performed Overall Cognitive Status: Within Functional Limits for tasks assessed                                        General Comments      Exercises     Assessment/Plan    PT Assessment All further PT needs can be met in  the next venue of care  PT Problem List Decreased mobility;Decreased strength       PT Treatment Interventions      PT Goals (Current goals can be found in the Care Plan section)  Acute Rehab PT Goals PT Goal Formulation: All assessment and education complete, DC therapy    Frequency     Barriers to discharge        Co-evaluation               AM-PAC PT "6 Clicks" Daily Activity  Outcome Measure Difficulty turning over in bed (including adjusting bedclothes, sheets and blankets)?: None Difficulty moving from lying on back to sitting on the side of the bed? : None Difficulty sitting down on and standing up from a chair with arms (e.g., wheelchair,  bedside commode, etc,.)?: None Help needed moving to and from a bed to chair (including a wheelchair)?: A Little Help needed walking in hospital room?: A Little Help needed climbing 3-5 steps with a railing? : A Little 6 Click Score: 21    End of Session   Activity Tolerance: Patient tolerated treatment well Patient left: in bed;with call bell/phone within reach;with family/visitor present Nurse Communication: Mobility status PT Visit Diagnosis: Other abnormalities of gait and mobility (R26.89)    Time: 0228-4069 PT Time Calculation (min) (ACUTE ONLY): 11 min   Charges:   PT Evaluation $PT Eval Low Complexity: 1 Procedure     PT G Codes:   PT G-Codes **NOT FOR INPATIENT CLASS** Functional Assessment Tool Used: AM-PAC 6 Clicks Basic Mobility;Clinical judgement Functional Limitation: Mobility: Walking and moving around Mobility: Walking and Moving Around Current Status (E6148): At least 20 percent but less than 40 percent impaired, limited or restricted Mobility: Walking and Moving Around Goal Status [phone]): At least 1 percent but less than 20 percent impaired, limited or restricted   Carmelia Bake, PT, DPT 10/19/2016 Pager: [phone]   York Ram E 10/19/2016, 1:11 PM

## 2016-10-19 NOTE — Consult Note (Signed)
Reason for Consult:  Request for skin biopsy Referring Physician: Leontae, Larry [name] is an 76 y.o. male.  HPI: 76 year old male with past medical history of non-Hodgkin's lymphoma status post splenectomy, anemia, and chronic renal insufficiency approaching dialysis. He was sent here from his oncologist's office for further evaluation of abnormal renal function.  He is just fatigued for several weeks.  He has a new rash and Dr. Marin Olp was concerned this is a reoccurance of his Non Hodgkin's lymphoma.  They are requesting a biopsy of this rash.  Rash has been present for some time and pt had a brief discussion in the past about dermatology evaluation, but did not have a dermatologist.    Past Medical History:  Diagnosis Date  . Anemia of chronic renal failure, stage 4 (severe) ([place]) 09/06/2016  . Chronic kidney disease    "I see [name]" (03/07/2016)  . Chronic kidney disease, stage 5 ([place])   . 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 GERD (gastroesophageal reflux disease)   . Gout   . H/O exposure to tuberculosis   . History of kidney stones    "they passed" (03/07/2016)  . Hyperkalemia   . Hypertension   . Hypotestosteronism 03/29/2011  . Hypothyroidism   . Non Hodgkin's lymphoma ([place])   . 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 Thyroid disease   . Type II diabetes mellitus ([place]) dx'd ~ 2005    Past Surgical History:  Procedure Laterality Date  . APPENDECTOMY  1948  . INGUINAL HERNIA REPAIR Right ~ 1948  . LAPAROTOMY  08/11/2011   Procedure: EXPLORATORY LAPAROTOMY;  Surgeon: Zenovia Jarred, MD;  Location: [facility];  Service: General;  Laterality: N/A;  . PANCREATECTOMY  2009   Partial w/ splenectomy  . perforation of duodenum repair     2013  . SPLENECTOMY, TOTAL  2009  . TONSILLECTOMY      Family History  Problem Relation Age of Onset  . Tuberculosis Father     Social History:  reports that he has never smoked. He has never used  smokeless tobacco. He reports that he does not drink alcohol or use drugs.  Allergies:  Allergies  Allergen Reactions  . Iohexol Other (See Comments)    Pt states that he was told by MD not to ever have contrast again.    . Allopurinol Rash    Medications:  Prior to Admission:  Prescriptions Prior to Admission  Medication Sig Dispense Refill Last Dose  . aspirin EC 81 MG EC tablet Take 1 tablet (81 mg total) by mouth daily. 30 tablet  10/18/2016 at unsure  . atorvastatin (LIPITOR) 10 MG tablet Take 10 mg by mouth at bedtime.    10/17/2016 at Unknown time  . doxazosin (CARDURA) 4 MG tablet Take 8 mg by mouth 2 (two) times daily.    10/18/2016 at Unknown time  . furosemide (LASIX) 80 MG tablet Take 80-160 mg by mouth 2 (two) times daily. Pt takes two tablets in the morning and one at bedtime.   10/18/2016 at Unknown time  . levothyroxine (SYNTHROID, LEVOTHROID) 175 MCG tablet Take 175 mcg by mouth daily before breakfast.   10/18/2016 at Unknown time  . metolazone (ZAROXOLYN) 2.5 MG tablet Take 2.5 mg by mouth every Monday, Wednesday, and Friday.   10/18/2016 at Unknown time  . omeprazole (PRILOSEC) 20 MG capsule Take 20 mg by mouth 2 (two) times daily.    10/18/2016 at Unknown time  .  pindolol (VISKEN) 10 MG tablet Take 10 mg by mouth daily.   10/18/2016 at unsure  . sitaGLIPtin (JANUVIA) 100 MG tablet Take 100 mg by mouth daily.   10/18/2016 at Unknown time  . triamcinolone ointment (KENALOG) 0.1 % Apply 1 application topically 2 (two) times daily as needed (for irritation).    Past Week at Unknown time   Scheduled: . allopurinol  100 mg Oral Daily  . aspirin EC  81 mg Oral Daily  . atorvastatin  10 mg Oral q1800  . bacitracin   Topical Once  . doxazosin  8 mg Oral BID  . heparin  5,000 Units Subcutaneous Q8H  . insulin aspart  0-9 Units Subcutaneous TID WC  . [START ON 10/20/2016] levothyroxine  150 mcg Oral QAC breakfast  . lidocaine-EPINEPHrine  10 mL Intradermal Once  . pantoprazole  40  mg Oral Daily  . pindolol  10 mg Oral Daily   Continuous:  Anti-infectives    None      Results for orders placed or performed during the hospital encounter of 10/18/16 (from the past 48 hour(s))  Glucose, capillary     Status: Abnormal   Collection Time: 10/18/16  2:38 PM  Result Value Ref Range   Glucose-Capillary 143 (H) 65 - 99 mg/dL  Protime-INR     Status: None   Collection Time: 10/18/16  4:42 PM  Result Value Ref Range   Prothrombin Time 14.4 11.4 - 15.2 seconds   INR 1.11   TSH     Status: Abnormal   Collection Time: 10/18/16  4:42 PM  Result Value Ref Range   TSH 0.147 (L) 0.350 - 4.500 uIU/mL    Comment: Performed by a 3rd Generation assay with a functional sensitivity of <=0.01 uIU/mL.  Hemoglobin A1c     Status: Abnormal   Collection Time: 10/18/16  4:42 PM  Result Value Ref Range   Hgb A1c MFr Bld 7.3 (H) 4.8 - 5.6 %    Comment: (NOTE)         Pre-diabetes: 5.7 - 6.4         Diabetes: >6.4         Glycemic control for adults with diabetes: <7.0    Mean Plasma Glucose 163 mg/dL    Comment: (NOTE) Performed At: [facility] [place], [place] [id number] Lindon Romp MD WG:[id number]   Glucose, capillary     Status: Abnormal   Collection Time: 10/18/16  4:55 PM  Result Value Ref Range   Glucose-Capillary 127 (H) 65 - 99 mg/dL  Glucose, capillary     Status: Abnormal   Collection Time: 10/18/16 10:33 PM  Result Value Ref Range   Glucose-Capillary 210 (H) 65 - 99 mg/dL  Basic metabolic panel     Status: Abnormal   Collection Time: 10/19/16  6:31 AM  Result Value Ref Range   Sodium 143 135 - 145 mmol/L   Potassium 4.0 3.5 - 5.1 mmol/L   Chloride 101 101 - 111 mmol/L   CO2 27 22 - 32 mmol/L   Glucose, Bld 108 (H) 65 - 99 mg/dL   BUN 133 (H) 6 - 20 mg/dL    Comment: RESULTS CONFIRMED BY MANUAL DILUTION   Creatinine, Ser 3.69 (H) 0.61 - 1.24 mg/dL   Calcium 8.3 (L) 8.9 - 10.3 mg/dL   GFR calc non Af Amer 15 (L) >60 mL/min    GFR calc Af Amer 17 (L) >60 mL/min    Comment: (NOTE) The  eGFR has been calculated using the CKD EPI equation. This calculation has not been validated in all clinical situations. eGFR's persistently <60 mL/min signify possible Chronic Kidney Disease.    Anion gap 15 5 - 15  CBC     Status: Abnormal   Collection Time: 10/19/16  6:31 AM  Result Value Ref Range   WBC 12.5 (H) 4.0 - 10.5 K/uL   RBC 3.13 (L) 4.22 - 5.81 MIL/uL   Hemoglobin 10.4 (L) 13.0 - 17.0 g/dL   HCT 30.5 (L) 39.0 - 52.0 %   MCV 97.4 78.0 - 100.0 fL   MCH 33.2 26.0 - 34.0 pg   MCHC 34.1 30.0 - 36.0 g/dL   RDW 14.3 11.5 - 15.5 %   Platelets 236 150 - 400 K/uL  Glucose, capillary     Status: Abnormal   Collection Time: 10/19/16  7:17 AM  Result Value Ref Range   Glucose-Capillary 114 (H) 65 - 99 mg/dL  Glucose, capillary     Status: Abnormal   Collection Time: 10/19/16 11:46 AM  Result Value Ref Range   Glucose-Capillary 172 (H) 65 - 99 mg/dL    Dg Chest 2 View  Result Date: 10/18/2016 CLINICAL DATA:  Weakness for the past 2 weeks.  Nonsmoker. EXAM: CHEST  2 VIEW COMPARISON:  Chest x-ray of March 07, 2016 FINDINGS: The lungs are adequately inflated. There is subtle increased density that projects in the right upper lobe at or just below the first costosternal junction. This is new since the previous study. There is no pleural effusion. The heart and pulmonary vascularity are normal. The mediastinum is normal in width. There is multilevel degenerative disc disease of the thoracic spine. IMPRESSION: Increased density in the right upper lobe possibly related to overlap of bony structures, but it is worrisome for infiltrate or a parenchymal mass. Further evaluation with chest CT scanning would be useful to exclude malignancy. Elsewhere no acute intrathoracic abnormality is observed. Electronically Signed   By: Theodoro Koval  [place] M.D.   On: 10/18/2016 12:28    Review of Systems  Constitutional: Positive for malaise/fatigue.   HENT: Negative.   Skin: Positive for rash (trunk, abdomen, arms and back/buttocks).  Neurological: Positive for weakness.  All other systems reviewed and are negative.  Blood pressure (!) 123/43, pulse 67, temperature 98.4 F (36.9 C), temperature source Oral, resp. rate 16, height 5' 8"  (1.727 m), weight 73.7 kg (162 lb 7.7 oz), SpO2 97 %. Physical Exam  Constitutional: He is oriented to person, place, and time. He appears well-developed and well-nourished. He appears distressed.  HENT:  Head: Normocephalic and atraumatic.  Eyes: Right eye exhibits no discharge. Left eye exhibits no discharge. No scleral icterus.  Pupils equal  Neck: Normal range of motion. Neck supple. No JVD present. No tracheal deviation present. No thyromegaly present.  Cardiovascular: Normal rate, regular rhythm, normal heart sounds and intact distal pulses.   Respiratory: Effort normal and breath sounds normal. No respiratory distress. He has no wheezes. He has no rales. He exhibits no tenderness.  GI: Soft. Bowel sounds are normal. He exhibits no distension and no mass. There is no tenderness. There is no rebound and no guarding.  Lymphadenopathy:    He has no cervical adenopathy.  Neurological: He is alert and oriented to person, place, and time. No cranial nerve deficit.  Skin: Rash noted. He is not diaphoretic. There is erythema.  Rash both arms, abdomen, back and buttocks.    Psychiatric: He has a normal mood  and affect. His behavior is normal. Judgment and thought content normal.    Assessment/Plan: Rash - rule out reoccurrence of lymphoma Fatigue Hx of non-Hodgkins splenic Lymphoma ESRD stage IV with rising creatinine Uremia Hypertension Type II diabetes Hypothyroid   Plan:  Skin biopsy abdomen.    Procedure:  Site was identified and cleaned with Betadine. 5 ml  Of 2% lidocaine with epinephrine was infiltrated into the site.  2  -3 mm punch biopsies were then obtained.  Direct pressure was held  to the site and bleeding was controlled.  Bacitracin and dry  4 x 4 dressing applied.  Pt tolerated the procedure well.     JENNINGS,WILLARD 10/19/2016, 12:46 PM   Agree with above.  Alphonsa Overall, MD, [facility] Surgery Pager: [phone] Office phone:  [phone]

## 2016-10-19 NOTE — Progress Notes (Signed)
PROGRESS NOTE [facility]   Larry [name]   DUK:[id number] DOB: 06-Dec-1940  DOA: 10/18/2016 PCP: No primary care provider on file.   Brief Narrative:  76 y.o. Male with significant Hx of splenic lymphoma (post splenectomy), CKD, and anemia presented to the ED with generalized weakness, 17 lb weight loss, and rash covering his abdomin. ED labs: creatinine 4.1, BUN 136. Sent to ED from oncologist office by Dr. Marin Olp.  Patient has appetite and was eating when we entered the room.     Assessment & Plan:   Principal Problem:   General weakness Active Problems:   Diabetes mellitus ([place])   Acute kidney injury superimposed on CKD (HCC)   Uremia   Skin rash  Generalized weakness: Most likely secondary to uremia. Most rule out recurrence of lymphoma. PT/OT ordered.  Acute on chronic kidney injury CKD stage IV: Creatinine 3.9 on 5/24, 4.1 yesterday, 3.69 today. GFR 15 today. BUN 133. Stop fluids. Hold Lasix. Repeat Labs in AM.  Skin rash: erythematous scaly rash on abd. Biopsied today. Need to rule out cutaneous lymphoma. Bacitracin ointment.  Hypothyroidism: TSH .147. Lower Synthroid dose.  HTN: Continue BB  Diastolic CHF: Continue BB, Statin. Hold IV Fluids and Lasix.  DVT prophylaxis: Heparin Code Status: FULL Family Communication: Wife at bedside Disposition Plan: Possibly tomorrow pending biopsy and CT  Consultants:   Oncology  Procedures:   Biopsy of abdomin awaiting results  Antimicrobials:  None  Subjective: Patient seen and examined. Patient states he has had generalized weakness. Patient also complained of a rash that is across his abdomin. Biopsy of rash was taken today, awaiting results. Patient has no other complaints.  Objective: Vitals:   10/19/16 0500 10/19/16 0531 10/19/16 1032 10/19/16 1337  BP:  (!) 117/40 (!) 123/43 (!) 134/50  Pulse:  75 67 65  Resp:  16  16  Temp:  98.4 F (36.9 C)  97.4 F (36.3 C)  TempSrc:  Oral  Oral  SpO2:   97%  98%  Weight: 162 lb 7.7 oz (73.7 kg)     Height:        Intake/Output Summary (Last 24 hours) at 10/19/16 1340 Last data filed at 10/19/16 0910  Gross per 24 hour  Intake             1447 ml  Output              250 ml  Net             1197 ml   Filed Weights   10/18/16 1435 10/19/16 0500  Weight: 158 lb 8.2 oz (71.9 kg) 162 lb 7.7 oz (73.7 kg)    Examination:  General exam: Appears fatigued and in no acute distress. Patient sitting in chair eating. Respiratory system: Clear to auscultation. No wheezes,crackle or rhonchi Cardiovascular system: S1 & S2 heard, RRR. No JVD, murmurs, rubs or gallops Central nervous system: Alert and oriented. No focal neurological deficits. Extremities: Mild Edema Skin: Erythematous scale like rash across abdomen  Psychiatry: Judgement and insight appear normal. Mood & affect appropriate.    Data Reviewed: I have personally reviewed following labs and imaging studies  CBC:  Recent Labs Lab 10/18/16 0932 10/19/16 0631  WBC 12.0* 12.5*  NEUTROABS 7.1*  --   HGB 11.6* 10.4*  HCT 33.5* 30.5*  MCV 100* 97.4  PLT 241 376   Basic Metabolic Panel:  Recent Labs Lab 10/18/16 0932 10/19/16 0631  NA 141 143  K 3.8 4.0  CL 98 101  CO2 27 27  GLUCOSE 148* 108*  BUN 136* 133*  CREATININE 4.1* 3.69*  CALCIUM 8.9 8.3*   GFR: Estimated Creatinine Clearance: 16.5 mL/min (A) (by C-G formula based on SCr of 3.69 mg/dL (H)). Liver Function Tests:  Recent Labs Lab 10/18/16 0932  AST 20  ALT 20  ALKPHOS 74  BILITOT 1.00  PROT 7.4  ALBUMIN 3.6   No results for input(s): LIPASE, AMYLASE in the last 168 hours. No results for input(s): AMMONIA in the last 168 hours. Coagulation Profile:  Recent Labs Lab 10/18/16 1642  INR 1.11   Cardiac Enzymes: No results for input(s): CKTOTAL, CKMB, CKMBINDEX, TROPONINI in the last 168 hours. BNP (last 3 results) No results for input(s): PROBNP in the last 8760 hours. HbA1C:  Recent  Labs  10/18/16 1642  HGBA1C 7.3*   CBG:  Recent Labs Lab 10/18/16 1438 10/18/16 1655 10/18/16 2233 10/19/16 0717 10/19/16 1146  GLUCAP 143* 127* 210* 114* 172*   Lipid Profile: No results for input(s): CHOL, HDL, LDLCALC, TRIG, CHOLHDL, LDLDIRECT in the last 72 hours. Thyroid Function Tests:  Recent Labs  10/18/16 1642  TSH 0.147*   Anemia Panel:  Recent Labs  10/18/16 0932  FERRITIN 153  TIBC 234  IRON 78   Sepsis Labs: No results for input(s): PROCALCITON, LATICACIDVEN in the last 168 hours.  No results found for this or any previous visit (from the past 240 hour(s)).       Radiology Studies: Dg Chest 2 View  Result Date: 10/18/2016 CLINICAL DATA:  Weakness for the past 2 weeks.  Nonsmoker. EXAM: CHEST  2 VIEW COMPARISON:  Chest x-ray of March 07, 2016 FINDINGS: The lungs are adequately inflated. There is subtle increased density that projects in the right upper lobe at or just below the first costosternal junction. This is new since the previous study. There is no pleural effusion. The heart and pulmonary vascularity are normal. The mediastinum is normal in width. There is multilevel degenerative disc disease of the thoracic spine. IMPRESSION: Increased density in the right upper lobe possibly related to overlap of bony structures, but it is worrisome for infiltrate or a parenchymal mass. Further evaluation with chest CT scanning would be useful to exclude malignancy. Elsewhere no acute intrathoracic abnormality is observed. Electronically Signed   By: David  Martinique M.D.   On: 10/18/2016 12:28      Scheduled Meds: . allopurinol  100 mg Oral Daily  . aspirin EC  81 mg Oral Daily  . atorvastatin  10 mg Oral q1800  . bacitracin   Topical Once  . doxazosin  8 mg Oral BID  . heparin  5,000 Units Subcutaneous Q8H  . insulin aspart  0-9 Units Subcutaneous TID WC  . [START ON 10/20/2016] levothyroxine  150 mcg Oral QAC breakfast  . lidocaine-EPINEPHrine  10 mL  Intradermal Once  . pantoprazole  40 mg Oral Daily  . pindolol  10 mg Oral Daily   Continuous Infusions:   LOS: 0 days     Romar Woodrick, PA-s   If 7PM-7AM, please contact night-coverage [email or web address] Password [email or web address] 10/19/2016, 1:40 PM

## 2016-10-19 NOTE — Care Management Note (Signed)
Case Management Note  Patient Details  Name: Larry [name] MRN: [id number] Date of Birth: 06-Aug-1940  Subjective/Objective: 76 y/o m admitted w/General weakness. From home. W/spouse,has cane,rw.PT-recc HHPT-patient pleasantly decline  HHPT. No further CM needs.                Action/Plan:d/c home.   Expected Discharge Date:                  Expected Discharge Plan:  Home/Self Care  In-House Referral:     Discharge planning Services  CM Consult  Post Acute Care Choice:  Durable Medical Equipment (cane, rw) Choice offered to:  Patient  DME Arranged:    DME Agency:     HH Arranged:  Patient Refused [place] Agency:     Status of Service:  Completed, signed off  If discussed at [place] of Stay Meetings, dates discussed:    Additional Comments:  Dessa Phi, RN 10/19/2016, 2:26 PM

## 2016-10-19 NOTE — Care Management Obs Status (Signed)
[facility] NOTIFICATION   Patient Details  Name: Larry [name] MRN: [id number] Date of Birth: 1940/09/30   Medicare Observation Status Notification Given:  Yes    MahabirJuliann Pulse, RN 10/19/2016, 2:28 PM

## 2016-10-19 NOTE — Consult Note (Signed)
Referral MD  Reason for Referral: Progressive renal insufficiency; history of splenic lymphoma; anemia of renal insufficiency   Chief Complaint  Patient presents with  . Abnormal Lab  : I discussed very weak.  HPI: Larry [name] is well-known to me. He is a 76 year old white male. He has a past history of splenic lymphoma. He underwent a splenectomy for this. This probably was about 7 or 8 years ago. He got treated afterwards with Rituxan. He's been in remission since.  He has progressive renal insufficiency. He was seen in the office yesterday. He lost quite a bit overweight. He was weak. He was not eating. Is having some nausea. He was on how BUN of 136. His creatinine was 4.1.  He subsequently was admitted.  He is on IV fluids. He appears to be very dehydrated.  He has this unusual rash on his anterior abdominal wall. This is a papular type rash. I'm sure exactly what this rashes. There is no new changes in his medications.  He's had no fever. He's had no bleeding. There's been no change in bowel or bladder habits.  This morning, he is feeling better. The IV fluids are helping.  His white cell count is 12.5. Hemoglobin is 10.4. Platelet count 236,000.  Yesterday, his iron studies showed a ferritin of 153 with iron saturation of 33%.  He's had no cough. He's had no leg swelling.   Past Medical History:  Diagnosis Date  . Anemia of chronic renal failure, stage 4 (severe) ([place]) 09/06/2016  . Chronic kidney disease    "I see Larry [name]" (03/07/2016)  . Chronic kidney disease, stage 5 ([place])   . 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 GERD (gastroesophageal reflux disease)   . Gout   . H/O exposure to tuberculosis   . History of kidney stones    "they passed" (03/07/2016)  . Hyperkalemia   . Hypertension   . Hypotestosteronism 03/29/2011  . Hypothyroidism   . Non Hodgkin's lymphoma ([place])   . 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 Thyroid disease   .  Type II diabetes mellitus ([place]) dx'd ~ 2005  :  Past Surgical History:  Procedure Laterality Date  . APPENDECTOMY  1948  . INGUINAL HERNIA REPAIR Right ~ 1948  . LAPAROTOMY  08/11/2011   Procedure: EXPLORATORY LAPAROTOMY;  Surgeon: Larry Jarred, MD;  Location: [facility];  Service: General;  Laterality: N/A;  . PANCREATECTOMY  2009   Partial w/ splenectomy  . perforation of duodenum repair     2013  . SPLENECTOMY, TOTAL  2009  . TONSILLECTOMY    :   Current Facility-Administered Medications:  .  0.9 %  sodium chloride infusion, , Intravenous, Continuous, Elmahi, Mutaz, MD, Last Rate: 75 mL/hr at 10/19/16 0500 .  acetaminophen (TYLENOL) tablet 650 mg, 650 mg, Oral, Q6H PRN **OR** acetaminophen (TYLENOL) suppository 650 mg, 650 mg, Rectal, Q6H PRN, Elmahi, Mutaz, MD .  allopurinol (ZYLOPRIM) tablet 100 mg, 100 mg, Oral, Daily, Elmahi, Mutaz, MD, 100 mg at 10/18/16 1713 .  aspirin EC tablet 81 mg, 81 mg, Oral, Daily, Elmahi, Mutaz, MD .  atorvastatin (LIPITOR) tablet 10 mg, 10 mg, Oral, q1800, Larry Monte, MD, 10 mg at 10/18/16 1713 .  doxazosin (CARDURA) tablet 8 mg, 8 mg, Oral, BID, Elmahi, Mutaz, MD, 8 mg at 10/18/16 2130 .  heparin injection 5,000 Units, 5,000 Units, Subcutaneous, Q8H, Larry Monte, MD, 5,000 Units at 10/19/16 0459 .  HYDROcodone-acetaminophen (NORCO/VICODIN) 5-325 MG per tablet 1-2  tablet, 1-2 tablet, Oral, Q4H PRN, Elmahi, Mutaz, MD .  insulin aspart (novoLOG) injection 0-9 Units, 0-9 Units, Subcutaneous, TID WC, Larry Monte, MD, 1 Units at 10/18/16 1713 .  levothyroxine (SYNTHROID, LEVOTHROID) tablet 175 mcg, 175 mcg, Oral, QAC breakfast, Elmahi, Mutaz, MD .  ondansetron (ZOFRAN) tablet 4 mg, 4 mg, Oral, Q6H PRN **OR** ondansetron (ZOFRAN) injection 4 mg, 4 mg, Intravenous, Q6H PRN, Elmahi, Mutaz, MD .  pantoprazole (PROTONIX) EC tablet 40 mg, 40 mg, Oral, Daily, Elmahi, Mutaz, MD .  pindolol (VISKEN) tablet 10 mg, 10 mg, Oral, Daily, Elmahi, Mutaz, MD:  .  allopurinol  100 mg Oral Daily  . aspirin EC  81 mg Oral Daily  . atorvastatin  10 mg Oral q1800  . doxazosin  8 mg Oral BID  . heparin  5,000 Units Subcutaneous Q8H  . insulin aspart  0-9 Units Subcutaneous TID WC  . levothyroxine  175 mcg Oral QAC breakfast  . pantoprazole  40 mg Oral Daily  . pindolol  10 mg Oral Daily  :  Allergies  Allergen Reactions  . Iohexol Other (See Comments)    Pt states that he was told by MD not to ever have contrast again.    . Allopurinol Rash  :  Family History  Problem Relation Age of Onset  . Tuberculosis Father   :  Social History   Social History  . Marital status: Married    Spouse name: N/A  . Number of children: N/A  . Years of education: N/A   Occupational History  . retired    Social History Main Topics  . Smoking status: Never Smoker  . Smokeless tobacco: Never Used  . Alcohol use No  . Drug use: No  . Sexual activity: Not on file   Other Topics Concern  . Not on file   Social History Narrative   Retired/disabled   Patient has a living will and a surrogate decision maker- Larry [name]-wife  :  Pertinent items are noted in HPI.  Exam: Patient Vitals for the past 24 hrs:  BP Temp Temp src Pulse Resp SpO2 Height Weight  10/19/16 0531 (!) 117/40 98.4 F (36.9 C) Oral 75 16 97 % - -  10/19/16 0500 - - - - - - - 162 lb 7.7 oz (73.7 kg)  10/18/16 2104 (!) 152/56 98 F (36.7 C) Oral 76 16 97 % - -  10/18/16 1435 (!) 154/56 97.6 F (36.4 C) Oral 80 14 97 % 5\' 8"  (1.727 m) 158 lb 8.2 oz (71.9 kg)  10/18/16 1316 (!) 120/59 - - 70 17 96 % - -  10/18/16 1108 (!) 137/94 97.8 F (36.6 C) Oral 72 18 98 % - -    As above    Recent Labs  10/18/16 0932 10/19/16 0631  WBC 12.0* 12.5*  HGB 11.6* 10.4*  HCT 33.5* 30.5*  PLT 241 236    Recent Labs  10/18/16 0932  NA 141  K 3.8  CL 98  CO2 27  GLUCOSE 148*  BUN 136*  CREATININE 4.1*  CALCIUM 8.9    Blood smear review:  None  Pathology: None      Assessment and Plan:  Larry [name] is a 76 year old white male. He has a history of splenic lymphoma. He also required splenectomy because of massive splenomegaly. He has been in remission for this.  I'm not sure what this rash is. I think it would be nice to get surgery to see him to do  a bedside biopsy of this rash. It might be drug related. I suppose it could represent cutaneous lymphoma. I don't know this is somehow related to his renal insufficiency.  He is in the hospital, I would get a CT scan of his chest abdomen and pelvis to make sure that there is nothing that would suggest lymphoma recurrence.  I'm just glad that he is feeling better.  It will be interesting to see what his renal function studies look like.  He definitely is more anemic. He probably would benefit from a dose of Aranesp. We have given this to him in the office.  We will follow along while he is in the hospital.  I appreciate everybody on 4 E. taking good care of him.  Lattie Haw, MD  Darlyn Chamber 33:6

## 2016-10-20 DIAGNOSIS — N179 Acute kidney failure, unspecified: Secondary | ICD-10-CM | POA: Diagnosis not present

## 2016-10-20 DIAGNOSIS — N189 Chronic kidney disease, unspecified: Secondary | ICD-10-CM | POA: Diagnosis not present

## 2016-10-20 DIAGNOSIS — R531 Weakness: Secondary | ICD-10-CM | POA: Diagnosis not present

## 2016-10-20 DIAGNOSIS — I132 Hypertensive heart and chronic kidney disease with heart failure and with stage 5 chronic kidney disease, or end stage renal disease: Secondary | ICD-10-CM | POA: Diagnosis not present

## 2016-10-20 DIAGNOSIS — R7989 Other specified abnormal findings of blood chemistry: Secondary | ICD-10-CM | POA: Diagnosis not present

## 2016-10-20 DIAGNOSIS — R21 Rash and other nonspecific skin eruption: Secondary | ICD-10-CM | POA: Diagnosis not present

## 2016-10-20 LAB — CBC
HEMATOCRIT: 33.2 % — AB (ref 39.0–52.0)
HEMOGLOBIN: 11.4 g/dL — AB (ref 13.0–17.0)
MCH: 34 pg (ref 26.0–34.0)
MCHC: 34.3 g/dL (ref 30.0–36.0)
MCV: 99.1 fL (ref 78.0–100.0)
Platelets: 254 10*3/uL (ref 150–400)
RBC: 3.35 MIL/uL — AB (ref 4.22–5.81)
RDW: 14.3 % (ref 11.5–15.5)
WBC: 12.5 10*3/uL — AB (ref 4.0–10.5)

## 2016-10-20 LAB — GLUCOSE, CAPILLARY
GLUCOSE-CAPILLARY: 187 mg/dL — AB (ref 65–99)
Glucose-Capillary: 105 mg/dL — ABNORMAL HIGH (ref 65–99)

## 2016-10-20 LAB — BASIC METABOLIC PANEL
ANION GAP: 15 (ref 5–15)
BUN: 110 mg/dL — AB (ref 6–20)
CHLORIDE: 101 mmol/L (ref 101–111)
CO2: 25 mmol/L (ref 22–32)
Calcium: 8.8 mg/dL — ABNORMAL LOW (ref 8.9–10.3)
Creatinine, Ser: 3.46 mg/dL — ABNORMAL HIGH (ref 0.61–1.24)
GFR, EST AFRICAN AMERICAN: 18 mL/min — AB (ref >60)
GFR, EST NON AFRICAN AMERICAN: 16 mL/min — AB (ref >60)
Glucose, Bld: 137 mg/dL — ABNORMAL HIGH (ref 65–99)
POTASSIUM: 4 mmol/L (ref 3.5–5.1)
SODIUM: 141 mmol/L (ref 135–145)

## 2016-10-20 MED ORDER — FUROSEMIDE 80 MG PO TABS: 80.0000 mg | ORAL_TABLET | Freq: Two times a day (BID) | ORAL | Status: DC

## 2016-10-20 NOTE — Progress Notes (Signed)
[facility] Progress Note     Subjective: CC: No complaints Patient just showered. Getting ready to go home. No pain or issues with bx site.   Objective: Vital signs in last 24 hours: Temp:  [97.4 F (36.3 C)-97.8 F (36.6 C)] 97.8 F (36.6 C) (06/22 0610) Pulse Rate:  [62-68] 68 (06/22 0610) Resp:  [16-18] 18 (06/22 0610) BP: (115-141)/(43-50) 115/50 (06/22 0610) SpO2:  [95 %-100 %] 95 % (06/22 0610) Weight:  [73 kg (160 lb 15 oz)] 73 kg (160 lb 15 oz) (06/22 0610) Last BM Date: 10/20/16  Intake/Output from previous day: 06/21 0701 - 06/22 0700 In: 450 [P.O.:450] Out: -  Intake/Output this shift: No intake/output data recorded.  PE: Gen:  Alert, NAD, pleasant Pulm:  Normal effort Skin: bx site on Left lower abdominal wall with 2 small punches, minimal bleeding. No swelling or purulence. Psych: A&Ox3   Lab Results:   Recent Labs  10/19/16 0631 10/20/16 0743  WBC 12.5* 12.5*  HGB 10.4* 11.4*  HCT 30.5* 33.2*  PLT 236 254   BMET  Recent Labs  10/19/16 0631 10/20/16 0743  NA 143 141  K 4.0 4.0  CL 101 101  CO2 27 25  GLUCOSE 108* 137*  BUN 133* 110*  CREATININE 3.69* 3.46*  CALCIUM 8.3* 8.8*   PT/INR  Recent Labs  10/18/16 1642  LABPROT 14.4  INR 1.11   CMP     Component Value Date/Time   NA 141 10/20/2016 0743   NA 141 10/18/2016 0932   NA 142 02/15/2016 0953   K 4.0 10/20/2016 0743   K 3.8 10/18/2016 0932   K 4.4 02/15/2016 0953   CL 101 10/20/2016 0743   CL 98 10/18/2016 0932   CO2 25 10/20/2016 0743   CO2 27 10/18/2016 0932   CO2 23 02/15/2016 0953   GLUCOSE 137 (H) 10/20/2016 0743   GLUCOSE 148 (H) 10/18/2016 0932   BUN 110 (H) 10/20/2016 0743   BUN 136 (H) 10/18/2016 0932   BUN 47.4 (H) 02/15/2016 0953   CREATININE 3.46 (H) 10/20/2016 0743   CREATININE 4.1 (HH) 10/18/2016 0932   CREATININE 2.6 (H) 02/15/2016 0953   CALCIUM 8.8 (L) 10/20/2016 0743   CALCIUM 8.9 10/18/2016 0932   CALCIUM 9.1 02/15/2016 0953    PROT 7.4 10/18/2016 0932   PROT 6.6 02/15/2016 0953   ALBUMIN 3.6 10/18/2016 0932   ALBUMIN 3.2 (L) 02/15/2016 0953   AST 20 10/18/2016 0932   AST 16 02/15/2016 0953   ALT 20 10/18/2016 0932   ALT 7 02/15/2016 0953   ALKPHOS 74 10/18/2016 0932   ALKPHOS 99 02/15/2016 0953   BILITOT 1.00 10/18/2016 0932   BILITOT 0.93 02/15/2016 0953   GFRNONAA 16 (L) 10/20/2016 0743   GFRAA 18 (L) 10/20/2016 0743   Lipase     Component Value Date/Time   LIPASE 175 (H) 08/11/2011 0849    Studies/Results: Ct Abdomen Pelvis Wo Contrast  Result Date: 10/20/2016 CLINICAL DATA:  Increased density projecting over the upper right chest radiographs. Weakness. History of non-Hodgkin's lymphoma, diabetes, chronic kidney disease and congestive heart failure. EXAM: CT CHEST, ABDOMEN AND PELVIS WITHOUT CONTRAST TECHNIQUE: Multidetector CT imaging of the chest, abdomen and pelvis was performed following the standard protocol without IV contrast. COMPARISON:  Chest abdominopelvic CT 06/01/2010. Abdominopelvic CT 08/11/2011. FINDINGS: CT CHEST FINDINGS Cardiovascular: Atherosclerosis of the aorta, great vessels and coronary arteries. Possible aortic valvular calcifications. The heart size is normal. There is no pericardial effusion. Mediastinum/Nodes: There  are no enlarged mediastinal, hilar or axillary lymph nodes. Hilar assessment is limited by the lack of intravenous contrast, although the hilar contours appear unchanged. The trachea, esophagus and thyroid gland demonstrate no significant findings. Lungs/Pleura: There is no pleural effusion. The lungs are clear. Specifically, no evidence of right upper lobe infiltrate or nodule. Previously questioned density appears osseous. Musculoskeletal/Chest wall: No chest wall mass or suspicious osseous findings. Mild degenerative changes throughout the spine. CT ABDOMEN AND PELVIS FINDINGS Hepatobiliary: Multiple calcified granulomas. There are no worrisome hepatic findings on  noncontrast imaging. No evidence of gallstones, gallbladder wall thickening or biliary dilatation. Pancreas: Unremarkable. No pancreatic ductal dilatation or surrounding inflammatory changes. 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 Adrenals/Urinary Tract: Stable mild nodular thickening of the left adrenal gland. The right adrenal gland appears normal. There is new bilateral hydronephrosis and hydroureter. The renal pelves are dilated in excess of the proximal ureters, although the distal ureters are dilated. There is a possible caliceal calculus in the upper pole of the right kidney, best seen on image 95 of series 6. No evidence of ureteral calculus. Both kidneys demonstrate cortical thinning. Small exophytic renal lesions are similar in size to previous study and predominantly low in density. 19 mm lesion projecting from the lower interpolar region of the left kidney on axial image 63 measures 25 HU, although previously was lower in density. Therefore, this is likely a mildly complex cyst. The bladder is moderately distended with mild trabeculation. Stomach/Bowel: No evidence of bowel wall thickening, distention or surrounding inflammatory change. Mild distal colonic diverticulosis. Vascular/Lymphatic: Stable mildly prominent lymph nodes in the gastrohepatic ligament. There are no enlarged retroperitoneal or pelvic lymph nodes. Mild aortic and branch vessel atherosclerosis. Reproductive: Stable mild enlargement of the prostate gland. Other: Probable postsurgical changes in the anterior abdominal wall. Minimal peri-incisional herniation of omental fat. Musculoskeletal: No acute or significant osseous findings. Mild degenerative changes throughout the spine associated with a convex right scoliosis. IMPRESSION: 1. No evidence of right upper lobe infiltrate or nodule. 2. Small lymph nodes in the upper abdomen are  stable. No progressive adenopathy or other evidence of recurrent lymphoma. 3. Bilateral hydronephrosis and hydroureter, possibly secondary to bladder distention and chronic bladder outlet obstruction. There may be a component of UPJ narrowing bilaterally. Suggest renal ultrasound pre and post voiding. 4. Exophytic renal lesions bilaterally are incompletely characterized without contrast, although not significantly changed in size from 2013, consistent with simple and mildly complex cysts. 5.  Aortic Atherosclerosis (ICD10-I70.0). Electronically Signed   By: Richardean Sale M.D.   On: 10/20/2016 09:14   Dg Chest 2 View  Result Date: 10/18/2016 CLINICAL DATA:  Weakness for the past 2 weeks.  Nonsmoker. EXAM: CHEST  2 VIEW COMPARISON:  Chest x-ray of March 07, 2016 FINDINGS: The lungs are adequately inflated. There is subtle increased density that projects in the right upper lobe at or just below the first costosternal junction. This is new since the previous study. There is no pleural effusion. The heart and pulmonary vascularity are normal. The mediastinum is normal in width. There is multilevel degenerative disc disease of the thoracic spine. IMPRESSION: Increased density in the right upper lobe possibly related to overlap of bony structures, but it is worrisome for infiltrate or a parenchymal mass. Further evaluation with chest CT scanning would be useful to exclude malignancy. Elsewhere no acute intrathoracic abnormality is observed. Electronically Signed  By: Tabius Rood  [place] M.D.   On: 10/18/2016 12:28   Ct Chest Wo Contrast  Result Date: 10/20/2016 CLINICAL DATA:  Increased density projecting over the upper right chest radiographs. Weakness. History of non-Hodgkin's lymphoma, diabetes, chronic kidney disease and congestive heart failure. EXAM: CT CHEST, ABDOMEN AND PELVIS WITHOUT CONTRAST TECHNIQUE: Multidetector CT imaging of the chest, abdomen and pelvis was performed following the standard protocol  without IV contrast. COMPARISON:  Chest abdominopelvic CT 06/01/2010. Abdominopelvic CT 08/11/2011. FINDINGS: CT CHEST FINDINGS Cardiovascular: Atherosclerosis of the aorta, great vessels and coronary arteries. Possible aortic valvular calcifications. The heart size is normal. There is no pericardial effusion. Mediastinum/Nodes: There are no enlarged mediastinal, hilar or axillary lymph nodes. Hilar assessment is limited by the lack of intravenous contrast, although the hilar contours appear unchanged. The trachea, esophagus and thyroid gland demonstrate no significant findings. Lungs/Pleura: There is no pleural effusion. The lungs are clear. Specifically, no evidence of right upper lobe infiltrate or nodule. Previously questioned density appears osseous. Musculoskeletal/Chest wall: No chest wall mass or suspicious osseous findings. Mild degenerative changes throughout the spine. CT ABDOMEN AND PELVIS FINDINGS Hepatobiliary: Multiple calcified granulomas. There are no worrisome hepatic findings on noncontrast imaging. No evidence of gallstones, gallbladder wall thickening or biliary dilatation. Pancreas: Unremarkable. No pancreatic ductal dilatation or surrounding inflammatory changes. 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 Adrenals/Urinary Tract: Stable mild nodular thickening of the left adrenal gland. The right adrenal gland appears normal. There is new bilateral hydronephrosis and hydroureter. The renal pelves are dilated in excess of the proximal ureters, although the distal ureters are dilated. There is a possible caliceal calculus in the upper pole of the right kidney, best seen on image 95 of series 6. No evidence of ureteral calculus. Both kidneys demonstrate cortical thinning. Small exophytic renal lesions are similar in size to previous study and predominantly low in density. 19 mm lesion projecting from  the lower interpolar region of the left kidney on axial image 63 measures 25 HU, although previously was lower in density. Therefore, this is likely a mildly complex cyst. The bladder is moderately distended with mild trabeculation. Stomach/Bowel: No evidence of bowel wall thickening, distention or surrounding inflammatory change. Mild distal colonic diverticulosis. Vascular/Lymphatic: Stable mildly prominent lymph nodes in the gastrohepatic ligament. There are no enlarged retroperitoneal or pelvic lymph nodes. Mild aortic and branch vessel atherosclerosis. Reproductive: Stable mild enlargement of the prostate gland. Other: Probable postsurgical changes in the anterior abdominal wall. Minimal peri-incisional herniation of omental fat. Musculoskeletal: No acute or significant osseous findings. Mild degenerative changes throughout the spine associated with a convex right scoliosis. IMPRESSION: 1. No evidence of right upper lobe infiltrate or nodule. 2. Small lymph nodes in the upper abdomen are stable. No progressive adenopathy or other evidence of recurrent lymphoma. 3. Bilateral hydronephrosis and hydroureter, possibly secondary to bladder distention and chronic bladder outlet obstruction. There may be a component of UPJ narrowing bilaterally. Suggest renal ultrasound pre and post voiding. 4. Exophytic renal lesions bilaterally are incompletely characterized without contrast, although not significantly changed in size from 2013, consistent with simple and mildly complex cysts. 5.  Aortic Atherosclerosis (ICD10-I70.0). Electronically Signed   By: Richardean Sale M.D.   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 Patient going home today. Bx site well healing,  does not need to f/u with [place].   LOS: 0 days    Brigid Re , [facility] 10/20/2016, 9:35 AM Pager: [phone] Consults: [phone]  Agree with above. Path pending.    Alphonsa Overall, MD, [facility] Pager: [phone] Office phone:  [phone]

## 2016-10-20 NOTE — Care Management Note (Signed)
Case Management Note  Patient Details  Name: Larry [name] MRN: [id number] Date of Birth: Nov 15, 1940  Subjective/Objective:                    Action/Plan:d/c home.   Expected Discharge Date:  10/20/16               Expected Discharge Plan:  Home/Self Care  In-House Referral:     Discharge planning Services  CM Consult  Post Acute Care Choice:  Durable Medical Equipment (cane, rw) Choice offered to:  Patient  DME Arranged:    DME Agency:     HH Arranged:  Patient Refused [place] Agency:     Status of Service:  Completed, signed off  If discussed at [employer] of Stay Meetings, dates discussed:    Additional Comments:  Dessa Phi, RN 10/20/2016, 10:38 AM

## 2016-10-20 NOTE — Discharge Summary (Signed)
Physician Discharge Summary  Larry [name] OZH:[id number] DOB: July 06, 1940 DOA: 10/18/2016  PCP: No primary care provider on file.  Admit date: 10/18/2016 Discharge date: 10/20/2016  Admitted From:home Disposition:home  Recommendations for Outpatient Follow-up:  1. Follow up with PCP and nephrologist in 1-2 weeks 2. Please obtain BMP/CBC in one week 3. Please follow up by skin biopsy result with your oncologist  Home Health: Patient declined Equipment/Devices: None Discharge Condition: Stable CODE STATUS: Full code Diet recommendation: Heart healthy  Brief/Interim Summary: 76 year old male with history of splenic lymphoma status post splenectomy treated with Rituxan and was on remission, presents in follow-up as outpatient oncologist, chronic kidney disease stage IV follows with  Nephrology, sent by patient's oncologist Dr. Marin Olp for nausea, not eating well, generalized weakness and worsening renal failure.  #Acute on chronic kidney disease is stage IV likely hemodynamically mediated in the setting of high dose Lasix. On admission the Lasix was held and treated with gentle IV fluid. Serum creatinine level improved to 3.4. BP and also trended down. Patient reported feeling better. IV fluids discontinued yesterday. Reduced the dose of home Lasix and patient was educated to follow up with his nephrologist to monitor labs. He verbalized understanding. Eager to go home. Denies any urinary complaints.  #Nausea and poor oral intake may be due to uremia. Clinically improved. Able to tolerate diet. Eager to go home today.  #Erythematous maculopapular rash involving torso and extremities, going on for few months as per patient concerning for possible cutaneous lymphoma. General surgery was consulted for skin biopsy. The skin biopsy was done. I recommended patient to follow-up with his oncologist for the biopsy result. He verbalized understanding.  #Generalized weakness: PT OT evaluation. No  focal neurological deficit. Patient declined home care services.  #Anemia of chronic kidney disease: Treatment with ESA as per oncology. Hemoglobin level acceptable for 60 daily. No sign of bleeding.  #Hypothyroidism: TSH low therefore reduced the dose of Synthroid. Recommended to follow-up with PCP outpatient.  Patient is eager to go home. He is walking in the room. Denies any symptoms. Patient is medically stable to discharge with outpatient follow-up. I discussed with the patient, nurse, pharmacist and care management team regarding discharge planning.  Discharge Diagnoses:  Principal Problem:   General weakness Active Problems:   Diabetes mellitus ([place])   Acute kidney injury superimposed on CKD (H[facility])   Uremia   Skin rash   Azotemia    Discharge Instructions  Discharge Instructions    Call MD for:  difficulty breathing, headache or visual disturbances    Complete by:  As directed    Call MD for:  extreme fatigue    Complete by:  As directed    Call MD for:  hives    Complete by:  As directed    Call MD for:  persistant dizziness or light-headedness    Complete by:  As directed    Call MD for:  persistant nausea and vomiting    Complete by:  As directed    Call MD for:  severe uncontrolled pain    Complete by:  As directed    Call MD for:  temperature >100.4    Complete by:  As directed    Diet - low sodium heart healthy    Complete by:  As directed    Discharge instructions    Complete by:  As directed    Please follow up with her nephrologist within a week to monitor your lab results. Please follow up with  her oncologist to follow up the results of biopsy.   Increase activity slowly    Complete by:  As directed      Allergies as of 10/20/2016      Reactions   Iohexol Other (See Comments)   Pt states that he was told by MD not to ever have contrast again.     Allopurinol Rash      Medication List    TAKE these medications   aspirin 81 MG EC tablet Take 1  tablet (81 mg total) by mouth daily.   atorvastatin 10 MG tablet Commonly known as:  LIPITOR Take 10 mg by mouth at bedtime.   doxazosin 4 MG tablet Commonly known as:  CARDURA Take 8 mg by mouth 2 (two) times daily.   furosemide 80 MG tablet Commonly known as:  LASIX Take 1 tablet (80 mg total) by mouth 2 (two) times daily. Pt takes two tablets in the morning and one at bedtime. What changed:  how much to take  when to take this   levothyroxine 175 MCG tablet Commonly known as:  SYNTHROID, LEVOTHROID Take 175 mcg by mouth daily before breakfast.   metolazone 2.5 MG tablet Commonly known as:  ZAROXOLYN Take 2.5 mg by mouth every Monday, Wednesday, and Friday.   omeprazole 20 MG capsule Commonly known as:  PRILOSEC Take 20 mg by mouth 2 (two) times daily.   pindolol 10 MG tablet Commonly known as:  VISKEN Take 10 mg by mouth daily.   sitaGLIPtin 100 MG tablet Commonly known as:  JANUVIA Take 100 mg by mouth daily.   triamcinolone ointment 0.1 % Commonly known as:  KENALOG Apply 1 application topically 2 (two) times daily as needed (for irritation).      Follow-up Information    Volanda Napoleon, MD. Schedule an appointment as soon as possible for a visit in 1 week(s).   Specialty:  Oncology Why:  follow up biopsy result Contact information: [address] [place] [address] [phone]        Estanislado Emms, MD. Schedule an appointment as soon as possible for a visit in 1 week(s).   Specialty:  Nephrology Contact information: [address]                          [place] [address] [phone]          Allergies  Allergen Reactions  . Iohexol Other (See Comments)    Pt states that he was told by MD not to ever have contrast again.    . Allopurinol Rash    Consultations: Oncologist General surgery  Procedures/Studies: Skin biopsy  Subjective: Seen and examined at bedside. Walking in the room and wanted to go  home sooner. Denied headache, dizziness, nausea, vomiting, chest pain or shortness of breath.  Discharge Exam: Vitals:   10/19/16 13-Aug-2017 10/20/16 0610  BP: (!) 141/50 (!) 115/50  Pulse: 62 68  Resp: 16 18  Temp: 97.5 F (36.4 C) 97.8 F (36.6 C)   Vitals:   10/19/16 1032 10/19/16 1337 10/19/16 08-13-2017 10/20/16 0610  BP: (!) 123/43 (!) 134/50 (!) 141/50 (!) 115/50  Pulse: 67 65 62 68  Resp:  16 16 18   Temp:  97.4 F (36.3 C) 97.5 F (36.4 C) 97.8 F (36.6 C)  TempSrc:  Oral Axillary Oral  SpO2:  98% 100% 95%  Weight:    73 kg (160 lb 15 oz)  Height:        General: Pt is alert, awake, not in acute distress Cardiovascular: RRR, S1/S2 +, no rubs, no gallops Respiratory: CTA bilaterally, no wheezing, no rhonchi Abdominal: Soft, NT, ND, bowel sounds + Extremities: Chronic lower extremities edema with thickened skin. Skin: Diffuse maculopapular erythematous rash in torso back and extremities.  The results of significant diagnostics from this hospitalization (including imaging, microbiology, ancillary and laboratory) are listed below for reference.     Microbiology: No results found for this or any previous visit (from the past 240 hour(s)).   Labs: BNP (last 3 results)  Recent Labs  03/07/16 1436  BNP 450.3*   Basic Metabolic Panel:  Recent Labs Lab 10/18/16 0932 10/19/16 0631 10/20/16 0743  NA 141 143 141  K 3.8 4.0 4.0  CL 98 101 101  CO2 27 27 25   GLUCOSE 148* 108* 137*  BUN 136* 133* 110*  CREATININE 4.1* 3.69* 3.46*  CALCIUM 8.9 8.3* 8.8*   Liver Function Tests:  Recent Labs Lab 10/18/16 0932  AST 20  ALT 20  ALKPHOS 74  BILITOT 1.00  PROT 7.4  ALBUMIN 3.6   No results for input(s): LIPASE, AMYLASE in the last 168 hours. No results for input(s): AMMONIA in the last 168 hours. CBC:  Recent Labs Lab 10/18/16 0932 10/19/16 0631 10/20/16 0743  WBC 12.0* 12.5* 12.5*  NEUTROABS 7.1*  --   --   HGB 11.6* 10.4* 11.4*  HCT 33.5* 30.5* 33.2*   MCV 100* 97.4 99.1  PLT 241 236 254   Cardiac Enzymes: No results for input(s): CKTOTAL, CKMB, CKMBINDEX, TROPONINI in the last 168 hours. BNP: Invalid input(s): POCBNP CBG:  Recent Labs Lab 10/19/16 0717 10/19/16 1146 10/19/16 1707 10/19/16 2044 10/20/16 0718  GLUCAP 114* 172* 113* 218* 105*   D-Dimer No results for input(s): DDIMER in the last 72 hours. Hgb A1c  Recent Labs  10/18/16 1642  HGBA1C 7.3*   Lipid Profile No results for input(s): CHOL, HDL, LDLCALC, TRIG, CHOLHDL, LDLDIRECT in the last 72 hours. Thyroid function studies  Recent Labs  10/18/16 1642  TSH 0.147*   Anemia work up  Recent Labs  10/18/16 0932  FERRITIN 153  TIBC 234  IRON 78   Urinalysis    Component Value Date/Time   COLORURINE YELLOW 03/07/2016 [place] 03/07/2016 1535   LABSPEC 1.015 03/07/2016 1535   PHURINE 6.0 03/07/2016 1535   GLUCOSEU NEGATIVE 03/07/2016 1535   HGBUR SMALL (A) 03/07/2016 1535   BILIRUBINUR NEGATIVE 03/07/2016 1535   KETONESUR NEGATIVE 03/07/2016 1535   PROTEINUR >300 (A) 03/07/2016 1535   UROBILINOGEN 0.2 08/11/2011 1050   NITRITE NEGATIVE 03/07/2016 1535   LEUKOCYTESUR NEGATIVE 03/07/2016 1535   Sepsis Labs Invalid input(s): PROCALCITONIN,  WBC,  LACTICIDVEN Microbiology No results found for this or any previous visit (from the past 240 hour(s)).   Time coordinating discharge: 28 minutes  SIGNED:   Rosita Fire, MD  [facility] 10/20/2016, 10:24 AM  If 7PM-7AM, please contact night-coverage [email or web address] Password [email or web address]

## 2016-10-23 ENCOUNTER — Telehealth: Payer: Self-pay

## 2016-10-23 NOTE — Telephone Encounter (Signed)
Received call from patient requesting appointment so he can receive scan and biopsy results.

## 2016-10-24 ENCOUNTER — Ambulatory Visit (HOSPITAL_BASED_OUTPATIENT_CLINIC_OR_DEPARTMENT_OTHER): Payer: Medicare Other

## 2016-10-24 ENCOUNTER — Ambulatory Visit (HOSPITAL_BASED_OUTPATIENT_CLINIC_OR_DEPARTMENT_OTHER): Payer: Medicare Other | Admitting: Family

## 2016-10-24 VITALS — BP 148/52 | HR 62 | Temp 97.5°F | Resp 20 | Wt 169.0 lb

## 2016-10-24 DIAGNOSIS — L309 Dermatitis, unspecified: Secondary | ICD-10-CM

## 2016-10-24 DIAGNOSIS — C8307 Small cell B-cell lymphoma, spleen: Secondary | ICD-10-CM

## 2016-10-24 DIAGNOSIS — N189 Chronic kidney disease, unspecified: Secondary | ICD-10-CM | POA: Diagnosis not present

## 2016-10-24 DIAGNOSIS — E291 Testicular hypofunction: Secondary | ICD-10-CM

## 2016-10-24 DIAGNOSIS — D631 Anemia in chronic kidney disease: Secondary | ICD-10-CM

## 2016-10-24 DIAGNOSIS — E349 Endocrine disorder, unspecified: Secondary | ICD-10-CM

## 2016-10-24 DIAGNOSIS — Z8572 Personal history of non-Hodgkin lymphomas: Secondary | ICD-10-CM | POA: Diagnosis not present

## 2016-10-24 LAB — CMP (CANCER CENTER ONLY)
ALT(SGPT): 23 U/L (ref 10–47)
AST: 22 U/L (ref 11–38)
Albumin: 3.2 g/dL — ABNORMAL LOW (ref 3.3–5.5)
Alkaline Phosphatase: 74 U/L (ref 26–84)
BUN: 88 mg/dL — AB (ref 7–22)
CALCIUM: 9.1 mg/dL (ref 8.0–10.3)
CO2: 27 mEq/L (ref 18–33)
Chloride: 105 mEq/L (ref 98–108)
Creat: 3 mg/dl — ABNORMAL HIGH (ref 0.6–1.2)
GLUCOSE: 172 mg/dL — AB (ref 73–118)
POTASSIUM: 4.8 meq/L — AB (ref 3.3–4.7)
Sodium: 142 mEq/L (ref 128–145)
Total Bilirubin: 0.7 mg/dl (ref 0.20–1.60)
Total Protein: 6.7 g/dL (ref 6.4–8.1)

## 2016-10-24 LAB — CBC WITH DIFFERENTIAL (CANCER CENTER ONLY)
BASO#: 0.1 10*3/uL (ref 0.0–0.2)
BASO%: 0.5 % (ref 0.0–2.0)
EOS ABS: 1 10*3/uL — AB (ref 0.0–0.5)
EOS%: 8 % — ABNORMAL HIGH (ref 0.0–7.0)
HEMATOCRIT: 31.4 % — AB (ref 38.7–49.9)
HGB: 10.5 g/dL — ABNORMAL LOW (ref 13.0–17.1)
LYMPH#: 1.6 10*3/uL (ref 0.9–3.3)
LYMPH%: 13.3 % — AB (ref 14.0–48.0)
MCH: 34.9 pg — AB (ref 28.0–33.4)
MCHC: 33.4 g/dL (ref 32.0–35.9)
MCV: 104 fL — AB (ref 82–98)
MONO#: 1.7 10*3/uL — AB (ref 0.1–0.9)
MONO%: 14.4 % — ABNORMAL HIGH (ref 0.0–13.0)
NEUT#: 7.7 10*3/uL — ABNORMAL HIGH (ref 1.5–6.5)
NEUT%: 63.8 % (ref 40.0–80.0)
PLATELETS: 267 10*3/uL (ref 145–400)
RBC: 3.01 10*6/uL — ABNORMAL LOW (ref 4.20–5.70)
RDW: 14.3 % (ref 11.1–15.7)
WBC: 12.1 10*3/uL — ABNORMAL HIGH (ref 4.0–10.0)

## 2016-10-24 LAB — IRON AND TIBC
%SAT: 18 % — ABNORMAL LOW (ref 20–55)
IRON: 38 ug/dL — AB (ref 42–163)
TIBC: 214 ug/dL (ref 202–409)
UIBC: 175 ug/dL (ref 117–376)

## 2016-10-24 LAB — FERRITIN: Ferritin: 98 ng/ml (ref 22–316)

## 2016-10-24 LAB — TECHNOLOGIST REVIEW CHCC SATELLITE: Tech Review: 1

## 2016-10-24 LAB — LACTATE DEHYDROGENASE: LDH: 215 U/L (ref 125–245)

## 2016-10-24 MED ORDER — TESTOSTERONE CYPIONATE 200 MG/ML IM SOLN
INTRAMUSCULAR | Status: AC
  Filled 2016-10-24: qty 2

## 2016-10-24 MED ORDER — TESTOSTERONE CYPIONATE 200 MG/ML IM SOLN
400.0000 mg | INTRAMUSCULAR | Status: DC
  Administered 2016-10-24: 400 mg via INTRAMUSCULAR

## 2016-10-24 MED ORDER — TRIAMCINOLONE ACETONIDE 0.1 % EX CREA: 1.0000 "application " | TOPICAL_CREAM | Freq: Two times a day (BID) | CUTANEOUS | 1 refills | Status: DC

## 2016-10-24 MED FILL — TRIAMCINOLONE 0.1% CREAM: 0.1 | 30 days supply | Qty: 454 | Fill #0

## 2016-10-24 NOTE — Progress Notes (Signed)
Hematology and Oncology Follow Up Visit  Larry [name] [id number] February 15, 1941 76 y.o. 10/24/2016   Principle Diagnosis:  1. 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 Chronic renal insufficiency 4. Anemia of renal insufficiency  Current Therapy:   Testosterone 400 mg IM once a month  Aranesp 300 g subcutaneous every 4 weeks for hemoglobin less than 10   Interim History:  Larry [name] is here today with his wife for post hospital follow-up. He is feeling better since cutting back on his lasix. He has an appointment to follow-up with Dr. Florene Glen on July 11th.  His CT scan showed the small lymph nodes in the upper abdomen were stable. There was no progressive adenopathy or other evidence of recurrent lymphoma. He still has diffuse eczema and would like something to help with itching and a refill on his Kenalog cream. We will have him try Benadryl as needed for the itching.  No fever, chills, n/v, cough, dizziness, SOB, chest pain, palpitations, abdominal pain or changes in bowel or bladder habits.  No tenderness, numbness or tingling in his extremities. He has chronic "puffiness" in his lower extremities that waxes and wanes. He is taking 40 mg of Lasix BID.  He has maintained a good appetite and is staying well hydrated. His weight is up 9 lbs since his last visit. He is weighing himself daily. No s/s of fluid overload. He is not in any distress at this time.   ECOG Performance Status: 2 - Symptomatic, <50% confined to bed  Medications:  Allergies as of 10/24/2016      Reactions   Iohexol Other (See Comments)   Pt states that he was told by MD not to ever have contrast again.     Allopurinol Rash      Medication List       Accurate as of 10/24/16 11:29 AM. Always use your most recent med list.          aspirin 81 MG EC tablet Take 1 tablet (81 mg total) by mouth daily.   atorvastatin 10 MG tablet Commonly known as:  LIPITOR Take 10  mg by mouth at bedtime.   doxazosin 4 MG tablet Commonly known as:  CARDURA Take 8 mg by mouth 2 (two) times daily.   furosemide 80 MG tablet Commonly known as:  LASIX Take 1 tablet (80 mg total) by mouth 2 (two) times daily. Pt takes two tablets in the morning and one at bedtime.   levothyroxine 175 MCG tablet Commonly known as:  SYNTHROID, LEVOTHROID Take 175 mcg by mouth daily before breakfast.   metolazone 2.5 MG tablet Commonly known as:  ZAROXOLYN Take 2.5 mg by mouth every Monday, Wednesday, and Friday.   omeprazole 20 MG capsule Commonly known as:  PRILOSEC Take 20 mg by mouth 2 (two) times daily.   pindolol 10 MG tablet Commonly known as:  VISKEN Take 10 mg by mouth daily.   sitaGLIPtin 100 MG tablet Commonly known as:  JANUVIA Take 100 mg by mouth daily.   triamcinolone ointment 0.1 % Commonly known as:  KENALOG Apply 1 application topically 2 (two) times daily as needed (for irritation).       Allergies:  Allergies  Allergen Reactions  . Iohexol Other (See Comments)    Pt states that he was told by MD not to ever have contrast again.    . Allopurinol Rash    Past Medical History, Surgical history, Social history, and Family History were reviewed  and updated.  Review of Systems: All other 10 point review of systems is negative.   Physical Exam:  vitals were not taken for this visit.  Wt Readings from Last 3 Encounters:  10/20/16 160 lb 15 oz (73 kg)  10/18/16 164 lb (74.4 kg)  09/27/16 173 lb (78.5 kg)    Ocular: Sclerae unicteric, pupils equal, round and reactive to light Ear-nose-throat: Oropharynx clear, dentition fair Lymphatic: No cervical, supraclavicular or axillary adenopathy Lungs no rales or rhonchi, good excursion bilaterally Heart regular rate and rhythm, no murmur appreciated Abd soft, nontender, positive bowel sounds, no liver or spleen tip palpated on exam, no fluid wave  MSK no focal spinal tenderness, no joint edema Neuro:  non-focal, well-oriented, appropriate affect Breasts: Deferred   Lab Results  Component Value Date   WBC 12.5 (H) 10/20/2016   HGB 11.4 (L) 10/20/2016   HCT 33.2 (L) 10/20/2016   MCV 99.1 10/20/2016   PLT 254 10/20/2016   Lab Results  Component Value Date   FERRITIN 153 10/18/2016   IRON 78 10/18/2016   TIBC 234 10/18/2016   UIBC 156 10/18/2016   IRONPCTSAT 33 10/18/2016   Lab Results  Component Value Date   RETICCTPCT 0.9 03/29/2011   RBC 3.35 (L) 10/20/2016   RETICCTABS 31.8 03/29/2011   No results found for: KPAFRELGTCHN, LAMBDASER, KAPLAMBRATIO No results found for: Kandis Cocking, [name] Lab Results  Component Value Date   TOTALPROTELP 6.7 07/14/2011   ALBUMINELP 56.6 07/14/2011   A1GS 5.7 (H) 07/14/2011   A2GS 12.7 (H) 07/14/2011   BETS 4.9 07/14/2011   BETA2SER 3.8 07/14/2011   GAMS 16.3 07/14/2011   MSPIKE NOT DET 07/14/2011   SPEI * 07/14/2011     Chemistry      Component Value Date/Time   NA 141 10/20/2016 0743   NA 141 10/18/2016 0932   NA 142 02/15/2016 0953   K 4.0 10/20/2016 0743   K 3.8 10/18/2016 0932   K 4.4 02/15/2016 0953   CL 101 10/20/2016 0743   CL 98 10/18/2016 0932   CO2 25 10/20/2016 0743   CO2 27 10/18/2016 0932   CO2 23 02/15/2016 0953   BUN 110 (H) 10/20/2016 0743   BUN 136 (H) 10/18/2016 0932   BUN 47.4 (H) 02/15/2016 0953   CREATININE 3.46 (H) 10/20/2016 0743   CREATININE 4.1 (HH) 10/18/2016 0932   CREATININE 2.6 (H) 02/15/2016 0953      Component Value Date/Time   CALCIUM 8.8 (L) 10/20/2016 0743   CALCIUM 8.9 10/18/2016 0932   CALCIUM 9.1 02/15/2016 0953   ALKPHOS 74 10/18/2016 0932   ALKPHOS 99 02/15/2016 0953   AST 20 10/18/2016 0932   AST 16 02/15/2016 0953   ALT 20 10/18/2016 0932   ALT 7 02/15/2016 0953   BILITOT 1.00 10/18/2016 0932   BILITOT 0.93 02/15/2016 0953      Impression and Plan: Larry [name] is a very pleasant 76 yo caucasian male with history of splenic marginal zone lymphoma with splenectomy  in 2009. He completed 1 cycle of Rituxan in April 2010 and so far has done well with no sign of recurrence. He is feeling better since being out of the hospital. His lasix was decreased to 40 mg BID.  Hgb is stable. No Aranesp needed at this time. He will get his testosterone injection today. Testosterone last week was low at 174.  I refilled his Kenalog cream today. Hopefully this will help with the eczema. He will also try Benadryl as  needed for itching.  He follows up with Dr. Florene Glen again in July.  We will plan to see him back in 1 months for repeat lab work and follow-up.  Both he and his wife know to contact our office with any questions or concerns. We can certainly see him sooner if need be.   Eliezer Bottom, NP 6/26/201811:29 AM

## 2016-10-24 NOTE — Patient Instructions (Signed)
Testosterone injection What is this medicine? TESTOSTERONE (tes TOS ter one) is the main male hormone. It supports normal male development such as muscle growth, facial hair, and deep voice. It is used in males to treat low testosterone levels. This medicine may be used for other purposes; ask your health care provider or pharmacist if you have questions. COMMON BRAND NAME(S): Andro-L.A., Aveed, Delatestryl, Depo-Testosterone, Virilon What should I tell my health care provider before I take this medicine? They need to know if you have any of these conditions: -cancer -diabetes -heart disease -kidney disease -liver disease -lung disease -prostate disease -an unusual or allergic reaction to testosterone, other medicines, foods, dyes, or preservatives -pregnant or trying to get pregnant -breast-feeding How should I use this medicine? This medicine is for injection into a muscle. It is usually given by a health care professional in a hospital or clinic setting. Contact your pediatrician regarding the use of this medicine in children. While this medicine may be prescribed for children as young as 12 years of age for selected conditions, precautions do apply. Overdosage: If you think you have taken too much of this medicine contact a poison control center or emergency room at once. NOTE: This medicine is only for you. Do not share this medicine with others. What if I miss a dose? Try not to miss a dose. Your doctor or health care professional will tell you when your next injection is due. Notify the office if you are unable to keep an appointment. What may interact with this medicine? -medicines for diabetes -medicines that treat or prevent blood clots like warfarin -oxyphenbutazone -propranolol -steroid medicines like prednisone or cortisone This list may not describe all possible interactions. Give your health care provider a list of all the medicines, herbs, non-prescription drugs, or  dietary supplements you use. Also tell them if you smoke, drink alcohol, or use illegal drugs. Some items may interact with your medicine. What should I watch for while using this medicine? Visit your doctor or health care professional for regular checks on your progress. They will need to check the level of testosterone in your blood. This medicine is only approved for use in men who have low levels of testosterone related to certain medical conditions. Heart attacks and strokes have been reported with the use of this medicine. Notify your doctor or health care professional and seek emergency treatment if you develop breathing problems; changes in vision; confusion; chest pain or chest tightness; sudden arm pain; severe, sudden headache; trouble speaking or understanding; sudden numbness or weakness of the face, arm or leg; loss of balance or coordination. Talk to your doctor about the risks and benefits of this medicine. This medicine may affect blood sugar levels. If you have diabetes, check with your doctor or health care professional before you change your diet or the dose of your diabetic medicine. Testosterone injections are not commonly used in women. Women should inform their doctor if they wish to become pregnant or think they might be pregnant. There is a potential for serious side effects to an unborn child. Talk to your health care professional or pharmacist for more information. Talk with your doctor or health care professional about your birth control options while taking this medicine. This drug is banned from use in athletes by most athletic organizations. What side effects may I notice from receiving this medicine? Side effects that you should report to your doctor or health care professional as soon as possible: -allergic reactions like skin rash,   itching or hives, swelling of the face, lips, or tongue -breast enlargement -breathing problems -changes in emotions or moods -deep or  hoarse voice -irregular menstrual periods -signs and symptoms of liver injury like dark yellow or brown urine; general ill feeling or flu-like symptoms; light-colored stools; loss of appetite; nausea; right upper belly pain; unusually weak or tired; yellowing of the eyes or skin -stomach pain -swelling of the ankles, feet, hands -too frequent or persistent erections -trouble passing urine or change in the amount of urine Side effects that usually do not require medical attention (report to your doctor or health care professional if they continue or are bothersome): -acne -change in sex drive or performance -facial hair growth -hair loss -headache This list may not describe all possible side effects. Call your doctor for medical advice about side effects. You may report side effects to FDA at 1-800-FDA-1088. Where should I keep my medicine? Keep out of the reach of children. This medicine can be abused. Keep your medicine in a safe place to protect it from theft. Do not share this medicine with anyone. Selling or giving away this medicine is dangerous and against the law. Store at room temperature between 20 and 25 degrees C (68 and 77 degrees F). Do not freeze. Protect from light. Follow the directions for the product you are prescribed. Throw away any unused medicine after the expiration date. NOTE: This sheet is a summary. It may not cover all possible information. If you have questions about this medicine, talk to your doctor, pharmacist, or health care provider.  2018 Elsevier/Gold Standard (2015-05-22 07:33:55)  

## 2016-10-26 ENCOUNTER — Telehealth: Payer: Self-pay | Admitting: *Deleted

## 2016-10-26 NOTE — Telephone Encounter (Addendum)
Patient is aware of results. Appointment made.   ----- Message from Eliezer Bottom, NP sent at 10/26/2016  8:40 AM EDT ----- Regarding: Iron  Iron saturation is a little low. He will need one dose of IV iron. LOS sent to [facility]. Thank you!   Sarah  ----- Message ----- From: Volanda Napoleon, MD Sent: 10/25/2016   6:26 AM To: Eliezer Bottom, NP    ----- Message ----- From: Interface, Lab In Three Zero One Sent: 10/24/2016  11:44 AM To: Volanda Napoleon, MD

## 2016-10-30 ENCOUNTER — Other Ambulatory Visit: Payer: Self-pay | Admitting: Family

## 2016-10-30 ENCOUNTER — Ambulatory Visit (HOSPITAL_BASED_OUTPATIENT_CLINIC_OR_DEPARTMENT_OTHER): Payer: Medicare Other

## 2016-10-30 VITALS — BP 144/49 | HR 73 | Temp 98.4°F | Resp 20

## 2016-10-30 DIAGNOSIS — D631 Anemia in chronic kidney disease: Secondary | ICD-10-CM

## 2016-10-30 DIAGNOSIS — D509 Iron deficiency anemia, unspecified: Secondary | ICD-10-CM | POA: Diagnosis present

## 2016-10-30 DIAGNOSIS — N184 Chronic kidney disease, stage 4 (severe): Secondary | ICD-10-CM

## 2016-10-30 DIAGNOSIS — F4310 Post-traumatic stress disorder, unspecified: Secondary | ICD-10-CM

## 2016-10-30 DIAGNOSIS — C8307 Small cell B-cell lymphoma, spleen: Secondary | ICD-10-CM

## 2016-10-30 DIAGNOSIS — F419 Anxiety disorder, unspecified: Secondary | ICD-10-CM

## 2016-10-30 MED ORDER — SODIUM CHLORIDE 0.9 % IV SOLN
INTRAVENOUS | Status: DC
  Administered 2016-10-30: 14:00:00 via INTRAVENOUS

## 2016-10-30 MED ORDER — ESCITALOPRAM OXALATE 10 MG PO TABS: 10.0000 mg | ORAL_TABLET | Freq: Every day | ORAL | 3 refills | Status: DC

## 2016-10-30 MED ORDER — CLONAZEPAM 0.5 MG PO TABS: 0.5000 mg | ORAL_TABLET | Freq: Two times a day (BID) | ORAL | 0 refills | Status: DC | PRN

## 2016-10-30 MED ORDER — SODIUM CHLORIDE 0.9 % IV SOLN
510.0000 mg | Freq: Once | INTRAVENOUS | Status: AC
  Administered 2016-10-30: 510 mg via INTRAVENOUS
  Filled 2016-10-30: qty 17

## 2016-10-30 MED FILL — ESCITALOPRAM 10 MG TABLET: 10 | 30 days supply | Qty: 30 | Fill #0

## 2016-10-30 MED FILL — clonazePAM 0.5 MG TABS: 0.5 | 15 days supply | Qty: 30 | Fill #0

## 2016-10-30 NOTE — Progress Notes (Signed)
Patient has had frequent anxiety and a few episodes of anger per his wife. She is concerned for their safety. She also expressed her concerns to Dr. Marin Olp. We will have him start taking the Lexapro 10 mg PO daily with klonopin 0.5 mg PO BID PRN. We discussed possible side effects in detail and they promise to follow-up with our office if he has any issues while taking these medications. All questions were answered. He will be back for MD visit on 7/10.

## 2016-11-07 ENCOUNTER — Ambulatory Visit: Payer: Medicare Other

## 2016-11-07 ENCOUNTER — Ambulatory Visit (HOSPITAL_BASED_OUTPATIENT_CLINIC_OR_DEPARTMENT_OTHER): Payer: Medicare Other | Admitting: Hematology & Oncology

## 2016-11-07 ENCOUNTER — Other Ambulatory Visit (HOSPITAL_BASED_OUTPATIENT_CLINIC_OR_DEPARTMENT_OTHER): Payer: Medicare Other

## 2016-11-07 VITALS — BP 151/62 | HR 72 | Temp 97.7°F | Resp 18 | Wt 175.0 lb

## 2016-11-07 DIAGNOSIS — E291 Testicular hypofunction: Secondary | ICD-10-CM

## 2016-11-07 DIAGNOSIS — E349 Endocrine disorder, unspecified: Secondary | ICD-10-CM

## 2016-11-07 DIAGNOSIS — D631 Anemia in chronic kidney disease: Secondary | ICD-10-CM

## 2016-11-07 DIAGNOSIS — L089 Local infection of the skin and subcutaneous tissue, unspecified: Secondary | ICD-10-CM | POA: Diagnosis not present

## 2016-11-07 DIAGNOSIS — N189 Chronic kidney disease, unspecified: Secondary | ICD-10-CM | POA: Diagnosis not present

## 2016-11-07 DIAGNOSIS — C8307 Small cell B-cell lymphoma, spleen: Secondary | ICD-10-CM

## 2016-11-07 LAB — CBC WITH DIFFERENTIAL (CANCER CENTER ONLY)
BASO#: 0 10*3/uL (ref 0.0–0.2)
BASO%: 0.3 % (ref 0.0–2.0)
EOS%: 8.6 % — AB (ref 0.0–7.0)
Eosinophils Absolute: 1 10*3/uL — ABNORMAL HIGH (ref 0.0–0.5)
HCT: 32.5 % — ABNORMAL LOW (ref 38.7–49.9)
HEMOGLOBIN: 11 g/dL — AB (ref 13.0–17.1)
LYMPH#: 1.3 10*3/uL (ref 0.9–3.3)
LYMPH%: 11.4 % — ABNORMAL LOW (ref 14.0–48.0)
MCH: 35.3 pg — ABNORMAL HIGH (ref 28.0–33.4)
MCHC: 33.8 g/dL (ref 32.0–35.9)
MCV: 104 fL — AB (ref 82–98)
MONO#: 1.9 10*3/uL — AB (ref 0.1–0.9)
MONO%: 16.1 % — ABNORMAL HIGH (ref 0.0–13.0)
NEUT%: 63.6 % (ref 40.0–80.0)
NEUTROS ABS: 7.3 10*3/uL — AB (ref 1.5–6.5)
Platelets: 223 10*3/uL (ref 145–400)
RBC: 3.12 10*6/uL — AB (ref 4.20–5.70)
RDW: 15 % (ref 11.1–15.7)
WBC: 11.5 10*3/uL — ABNORMAL HIGH (ref 4.0–10.0)

## 2016-11-07 LAB — CMP (CANCER CENTER ONLY)
ALBUMIN: 3.1 g/dL — AB (ref 3.3–5.5)
ALK PHOS: 73 U/L (ref 26–84)
ALT: 22 U/L (ref 10–47)
AST: 17 U/L (ref 11–38)
BILIRUBIN TOTAL: 0.8 mg/dL (ref 0.20–1.60)
BUN, Bld: 54 mg/dL — ABNORMAL HIGH (ref 7–22)
CALCIUM: 9 mg/dL (ref 8.0–10.3)
CO2: 24 mEq/L (ref 18–33)
Chloride: 106 mEq/L (ref 98–108)
Creat: 2.7 mg/dl — ABNORMAL HIGH (ref 0.6–1.2)
GLUCOSE: 130 mg/dL — AB (ref 73–118)
POTASSIUM: 4.7 meq/L (ref 3.3–4.7)
Sodium: 138 mEq/L (ref 128–145)
TOTAL PROTEIN: 6.5 g/dL (ref 6.4–8.1)

## 2016-11-07 NOTE — Progress Notes (Signed)
Hematology and Oncology Follow Up Visit  Larry [name] [id number] [birth year] 76 y.o. 11/07/2016   Principle Diagnosis:  1. 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 Chronic renal insufficiency 4. Anemia of renal insufficiency  Current Therapy:   Testosterone 400 mg IM once a month  Aranesp 300 g subcutaneous every 4 weeks for hemoglobin less than 10   Interim History:  Larry [name] is here today with his wife for follow-up. Thankfully, he is beginning to perk up. He is looking much better. The only thing that we have done differently that we gave him iron last week or so. We saw him, his iron saturation was only 18%. His ferritin was 98, which is low for him.  His renal function is getting much better. I'm not sure as to why. Again, I am just pleased that it is improving.  We also put him on an antidepressant. We placed him on Lexapro. We also gave him some Klonopin. His wife said that he is not taking the Klonopin but after starting the Lexapro, he began to feel better.   He still has issues with the rash. When his hospital, a biopsy was done of the rash on his abdomen. The pathology report ([id number]) showed spongiotic/eczematous dermatitis. The pathologist thought this might be a drug reaction.    There's no problem with bowels or bladder. He's had no nausea or vomiting. He's had no fever. He's had no bleeding.   Currently, his performance status is ECOG 2.   Medications:  Allergies as of 11/07/2016      Reactions   Iohexol Other (See Comments)   Pt states that he was told by MD not to ever have contrast again.     Allopurinol Rash      Medication List       Accurate as of 11/07/16  1:33 PM. Always use your most recent med list.          allopurinol 100 MG tablet Commonly known as:  ZYLOPRIM Take 100 mg by mouth daily.   amLODipine 5 MG tablet Commonly known as:  NORVASC Take 5 mg by mouth daily.   aspirin 81 MG EC  tablet Take 1 tablet (81 mg total) by mouth daily.   atorvastatin 10 MG tablet Commonly known as:  LIPITOR Take 10 mg by mouth at bedtime.   carvedilol 3.125 MG tablet Commonly known as:  COREG Take 3.125 mg by mouth 2 (two) times daily with a meal.   clonazePAM 0.5 MG tablet Commonly known as:  KLONOPIN Take 1 tablet (0.5 mg total) by mouth 2 (two) times daily as needed for anxiety.   doxazosin 4 MG tablet Commonly known as:  CARDURA Take 8 mg by mouth 2 (two) times daily.   escitalopram 10 MG tablet Commonly known as:  LEXAPRO Take 1 tablet (10 mg total) by mouth daily.   furosemide 80 MG tablet Commonly known as:  LASIX Take 1 tablet (80 mg total) by mouth 2 (two) times daily. Pt takes two tablets in the morning and one at bedtime.   levothyroxine 175 MCG tablet Commonly known as:  SYNTHROID, LEVOTHROID Take 175 mcg by mouth daily before breakfast.   omeprazole 20 MG capsule Commonly known as:  PRILOSEC Take 20 mg by mouth 2 (two) times daily.   sitaGLIPtin 25 MG tablet Commonly known as:  JANUVIA Take 25 mg by mouth daily.   triamcinolone ointment 0.1 % Commonly known as:  KENALOG Apply 1 application  topically 2 (two) times daily as needed (for irritation).       Allergies:  Allergies  Allergen Reactions  . Iohexol Other (See Comments)    Pt states that he was told by MD not to ever have contrast again.    . Allopurinol Rash    Past Medical History, Surgical history, Social history, and Family History were reviewed and updated.  Review of Systems: All other 10 point review of systems is negative.   Physical Exam:  weight is 175 lb (79.4 kg). His oral temperature is 97.7 F (36.5 C). His blood pressure is 151/62 (abnormal) and his pulse is 72. His respiration is 18 and oxygen saturation is 96%.   Wt Readings from Last 3 Encounters:  11/07/16 175 lb (79.4 kg)  10/24/16 169 lb (76.7 kg)  10/20/16 160 lb 15 oz (73 kg)    Thin somewhat disheveled  white male in no obvious distress. Head and neck exam shows no ocular or oral lesions.He does have a pustular type rash on the back of his neck.  He has no scleral icterus. There is no palpable cervical or supraclavicular lymph nodes. Lungs are clear bilaterally. Cardiac exam regular rate and rhythm with no with a 2/6 systolic ejection murmur. Abdomen is soft.  Has a laparotomy scar in the midline. A fluid wave is not noted. There is no guarding or rebound tenderness. There is no palpable hepatomegaly. Extremities shows 2+ chronic edema in his lower legs. Skin exam shows some actinic keratoses. He has his large papular type rash on his abdominal wall. Neurological exam shows no focal neurological deficits.   Lab Results  Component Value Date   WBC 11.5 (H) 11/07/2016   HGB 11.0 (L) 11/07/2016   HCT 32.5 (L) 11/07/2016   MCV 104 (H) 11/07/2016   PLT 223 11/07/2016   Lab Results  Component Value Date   FERRITIN 98 10/24/2016   IRON 38 (L) 10/24/2016   TIBC 214 10/24/2016   UIBC 175 10/24/2016   IRONPCTSAT 18 (L) 10/24/2016   Lab Results  Component Value Date   RETICCTPCT 0.9 03/29/2011   RBC 3.12 (L) 11/07/2016   RETICCTABS 31.8 03/29/2011   No results found for: KPAFRELGTCHN, LAMBDASER, KAPLAMBRATIO No results found for: Kandis Cocking, [facility] Lab Results  Component Value Date   TOTALPROTELP 6.7 07/14/2011   ALBUMINELP 56.6 07/14/2011   A1GS 5.7 (H) 07/14/2011   A2GS 12.7 (H) 07/14/2011   BETS 4.9 07/14/2011   BETA2SER 3.8 07/14/2011   GAMS 16.3 07/14/2011   MSPIKE NOT DET 07/14/2011   SPEI * 07/14/2011     Chemistry      Component Value Date/Time   NA 138 11/07/2016 1103   NA 142 02/15/2016 0953   K 4.7 11/07/2016 1103   K 4.4 02/15/2016 0953   CL 106 11/07/2016 1103   CO2 24 11/07/2016 1103   CO2 23 02/15/2016 0953   BUN 54 (H) 11/07/2016 1103   BUN 47.4 (H) 02/15/2016 0953   CREATININE 2.7 (H) 11/07/2016 1103   CREATININE 2.6 (H) 02/15/2016 0953      Component  Value Date/Time   CALCIUM 9.0 11/07/2016 1103   CALCIUM 9.1 02/15/2016 0953   ALKPHOS 73 11/07/2016 1103   ALKPHOS 99 02/15/2016 0953   AST 17 11/07/2016 1103   AST 16 02/15/2016 0953   ALT 22 11/07/2016 1103   ALT 7 02/15/2016 0953   BILITOT 0.80 11/07/2016 1103   BILITOT 0.93 02/15/2016 6269  Impression and Plan: Mr. Preis is a  76 yo caucasian male with hypotestosteronemia. He also has history of splenic marginal zone lymphoma with splenectomy in 2009. He then completed 1 cycle of Rituxan in April 2010. So far he has done well and there has been no recurrence.   I'm very impressed with his improvement. I'm not sure exactly what caused this. I will know if this is from the antidepressant that he is now taking.  He did get iron last week. This also could've made him feel a little bit better.  I'm incredibly pleased to see that his renal function is getting better. I thought, for sure, that he would require dialysis when I last saw him.   He definitely needs to be seen by dermatology. I did take a culture of one of these pustular lesions on his neck.I'm not sure exactly what might be going on. The biopsy of the rash on his abdomen really was not all that specific for a cause.   I think that we can probably get him back now in about 4 weeks or so. Again he is doing better. I'm just happy that his quality of life is better.   I spent about 40 minutes with he and his wife today.  Volanda Napoleon, MD 7/10/20181:33 PM

## 2016-11-08 DIAGNOSIS — I120 Hypertensive chronic kidney disease with stage 5 chronic kidney disease or end stage renal disease: Secondary | ICD-10-CM | POA: Diagnosis not present

## 2016-11-08 DIAGNOSIS — N2581 Secondary hyperparathyroidism of renal origin: Secondary | ICD-10-CM | POA: Diagnosis not present

## 2016-11-08 DIAGNOSIS — R600 Localized edema: Secondary | ICD-10-CM | POA: Diagnosis not present

## 2016-11-08 DIAGNOSIS — N185 Chronic kidney disease, stage 5: Secondary | ICD-10-CM | POA: Diagnosis not present

## 2016-11-08 DIAGNOSIS — N1330 Unspecified hydronephrosis: Secondary | ICD-10-CM | POA: Diagnosis not present

## 2016-11-08 DIAGNOSIS — Z6827 Body mass index (BMI) 27.0-27.9, adult: Secondary | ICD-10-CM | POA: Diagnosis not present

## 2016-11-08 LAB — LACTATE DEHYDROGENASE: LDH: 186 U/L (ref 125–245)

## 2016-11-08 LAB — TESTOSTERONE: Testosterone, Serum: 957 ng/dL — ABNORMAL HIGH (ref 264–916)

## 2016-11-10 ENCOUNTER — Other Ambulatory Visit: Payer: Self-pay | Admitting: Nephrology

## 2016-11-10 DIAGNOSIS — N185 Chronic kidney disease, stage 5: Secondary | ICD-10-CM

## 2016-11-11 LAB — WOUND CULTURE: Organism ID, Bacteria: NONE SEEN

## 2016-11-17 ENCOUNTER — Ambulatory Visit
Admission: RE | Admit: 2016-11-17 | Discharge: 2016-11-17 | Disposition: A | Discharge status: Home / Self Care | Payer: Medicare Other | Source: Ambulatory Visit | Attending: Nephrology | Admitting: Nephrology

## 2016-11-17 DIAGNOSIS — N1330 Unspecified hydronephrosis: Secondary | ICD-10-CM | POA: Diagnosis not present

## 2016-11-17 DIAGNOSIS — N185 Chronic kidney disease, stage 5: Secondary | ICD-10-CM

## 2016-11-28 DIAGNOSIS — N281 Cyst of kidney, acquired: Secondary | ICD-10-CM | POA: Diagnosis not present

## 2016-11-28 DIAGNOSIS — N184 Chronic kidney disease, stage 4 (severe): Secondary | ICD-10-CM | POA: Diagnosis not present

## 2016-11-28 DIAGNOSIS — N200 Calculus of kidney: Secondary | ICD-10-CM | POA: Diagnosis not present

## 2016-11-28 DIAGNOSIS — N1330 Unspecified hydronephrosis: Secondary | ICD-10-CM | POA: Diagnosis not present

## 2016-11-29 DIAGNOSIS — R339 Retention of urine, unspecified: Secondary | ICD-10-CM | POA: Diagnosis not present

## 2016-11-29 DIAGNOSIS — I10 Essential (primary) hypertension: Secondary | ICD-10-CM | POA: Diagnosis not present

## 2016-11-29 DIAGNOSIS — E1121 Type 2 diabetes mellitus with diabetic nephropathy: Secondary | ICD-10-CM | POA: Diagnosis not present

## 2016-11-29 DIAGNOSIS — N189 Chronic kidney disease, unspecified: Secondary | ICD-10-CM | POA: Diagnosis not present

## 2016-11-29 DIAGNOSIS — Z7984 Long term (current) use of oral hypoglycemic drugs: Secondary | ICD-10-CM | POA: Diagnosis not present

## 2016-12-08 ENCOUNTER — Ambulatory Visit (HOSPITAL_BASED_OUTPATIENT_CLINIC_OR_DEPARTMENT_OTHER): Payer: Medicare Other | Admitting: Hematology & Oncology

## 2016-12-08 ENCOUNTER — Other Ambulatory Visit: Payer: Self-pay | Admitting: Family

## 2016-12-08 ENCOUNTER — Ambulatory Visit (HOSPITAL_BASED_OUTPATIENT_CLINIC_OR_DEPARTMENT_OTHER): Payer: Medicare Other

## 2016-12-08 ENCOUNTER — Other Ambulatory Visit (HOSPITAL_BASED_OUTPATIENT_CLINIC_OR_DEPARTMENT_OTHER): Payer: Medicare Other

## 2016-12-08 ENCOUNTER — Telehealth: Payer: Self-pay | Admitting: *Deleted

## 2016-12-08 DIAGNOSIS — E349 Endocrine disorder, unspecified: Secondary | ICD-10-CM

## 2016-12-08 DIAGNOSIS — Z8572 Personal history of non-Hodgkin lymphomas: Secondary | ICD-10-CM | POA: Diagnosis not present

## 2016-12-08 DIAGNOSIS — C8307 Small cell B-cell lymphoma, spleen: Secondary | ICD-10-CM | POA: Diagnosis not present

## 2016-12-08 DIAGNOSIS — L089 Local infection of the skin and subcutaneous tissue, unspecified: Secondary | ICD-10-CM

## 2016-12-08 DIAGNOSIS — E291 Testicular hypofunction: Secondary | ICD-10-CM

## 2016-12-08 DIAGNOSIS — N184 Chronic kidney disease, stage 4 (severe): Secondary | ICD-10-CM

## 2016-12-08 DIAGNOSIS — F4310 Post-traumatic stress disorder, unspecified: Secondary | ICD-10-CM

## 2016-12-08 DIAGNOSIS — D509 Iron deficiency anemia, unspecified: Secondary | ICD-10-CM

## 2016-12-08 DIAGNOSIS — N189 Chronic kidney disease, unspecified: Secondary | ICD-10-CM | POA: Diagnosis not present

## 2016-12-08 DIAGNOSIS — D631 Anemia in chronic kidney disease: Secondary | ICD-10-CM

## 2016-12-08 DIAGNOSIS — F419 Anxiety disorder, unspecified: Secondary | ICD-10-CM

## 2016-12-08 LAB — IRON AND TIBC
%SAT: 68 % — AB (ref 20–55)
Iron: 126 ug/dL (ref 42–163)
TIBC: 184 ug/dL — ABNORMAL LOW (ref 202–409)
UIBC: 58 ug/dL — ABNORMAL LOW (ref 117–376)

## 2016-12-08 LAB — CMP (CANCER CENTER ONLY)
ALT(SGPT): 18 U/L (ref 10–47)
AST: 21 U/L (ref 11–38)
Albumin: 3.5 g/dL (ref 3.3–5.5)
Alkaline Phosphatase: 105 U/L — ABNORMAL HIGH (ref 26–84)
BUN: 67 mg/dL — AB (ref 7–22)
CHLORIDE: 106 meq/L (ref 98–108)
CO2: 27 meq/L (ref 18–33)
CREATININE: 3.3 mg/dL — AB (ref 0.6–1.2)
Calcium: 9.5 mg/dL (ref 8.0–10.3)
Glucose, Bld: 213 mg/dL — ABNORMAL HIGH (ref 73–118)
POTASSIUM: 4.9 meq/L — AB (ref 3.3–4.7)
SODIUM: 140 meq/L (ref 128–145)
Total Bilirubin: 0.9 mg/dl (ref 0.20–1.60)
Total Protein: 7.3 g/dL (ref 6.4–8.1)

## 2016-12-08 LAB — CBC WITH DIFFERENTIAL (CANCER CENTER ONLY)
BASO#: 0.1 10*3/uL (ref 0.0–0.2)
BASO%: 0.6 % (ref 0.0–2.0)
EOS ABS: 1.5 10*3/uL — AB (ref 0.0–0.5)
EOS%: 17.7 % — ABNORMAL HIGH (ref 0.0–7.0)
HCT: 33 % — ABNORMAL LOW (ref 38.7–49.9)
HGB: 11.3 g/dL — ABNORMAL LOW (ref 13.0–17.1)
LYMPH#: 1.4 10*3/uL (ref 0.9–3.3)
LYMPH%: 16.3 % (ref 14.0–48.0)
MCH: 34.7 pg — AB (ref 28.0–33.4)
MCHC: 34.2 g/dL (ref 32.0–35.9)
MCV: 101 fL — ABNORMAL HIGH (ref 82–98)
MONO#: 1 10*3/uL — ABNORMAL HIGH (ref 0.1–0.9)
MONO%: 12 % (ref 0.0–13.0)
NEUT#: 4.4 10*3/uL (ref 1.5–6.5)
NEUT%: 53.4 % (ref 40.0–80.0)
PLATELETS: 178 10*3/uL (ref 145–400)
RBC: 3.26 10*6/uL — ABNORMAL LOW (ref 4.20–5.70)
RDW: 14.1 % (ref 11.1–15.7)
WBC: 8.3 10*3/uL (ref 4.0–10.0)

## 2016-12-08 LAB — FERRITIN: FERRITIN: 360 ng/mL — AB (ref 22–316)

## 2016-12-08 MED ORDER — TESTOSTERONE CYPIONATE 200 MG/ML IM SOLN
INTRAMUSCULAR | Status: AC
  Filled 2016-12-08: qty 2

## 2016-12-08 MED ORDER — TESTOSTERONE CYPIONATE 200 MG/ML IM SOLN
400.0000 mg | INTRAMUSCULAR | Status: DC
  Administered 2016-12-08: 400 mg via INTRAMUSCULAR

## 2016-12-08 MED ORDER — ESCITALOPRAM OXALATE 10 MG PO TABS: 10.0000 mg | ORAL_TABLET | Freq: Every day | ORAL | 3 refills | Status: DC

## 2016-12-08 MED ORDER — ESCITALOPRAM OXALATE 10 MG PO TABS: 10.0000 mg | ORAL_TABLET | Freq: Every day | ORAL | 3 refills | Status: AC

## 2016-12-08 MED FILL — JANUVIA 50 MG TABLET: 50 | 30 days supply | Qty: 30 | Fill #0

## 2016-12-08 MED FILL — ESCITALOPRAM 10 MG TABLET: 10 | 90 days supply | Qty: 90 | Fill #0

## 2016-12-08 NOTE — Addendum Note (Signed)
Addended by: Burney Gauze R on: 12/08/2016 12:28 PM   Modules accepted: Orders

## 2016-12-08 NOTE — Telephone Encounter (Signed)
Critical Value Creatinine 3.3 Dr Ennever notified. No orders at this time 

## 2016-12-08 NOTE — Progress Notes (Signed)
Hematology and Oncology Follow Up Visit  Larry [name] [id number] [birth year] 76 y.o. 12/08/2016   Principle Diagnosis:  1. 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 Chronic renal insufficiency 4. Anemia of renal insufficiency  Current Therapy:   Testosterone 400 mg IM once a month  Aranesp 300 g subcutaneous every 4 weeks for hemoglobin less than 10   Interim History:  Larry [name] is here today with his wife for follow-up. Thankfully, he is beginning to perk up. He is looking much better. I'm glad that he is doing better. The Lexapro seems to be doing the job.  He has not yet seen the dermatologist. He will not see the dermatologist for another week or so.   He's had no fever. He is still urinating. He has seen a urologist. The urologist will help with his bladder dysfunction.   He's had no bleeding. He's had no worsening with leg swelling. He has a chronic renal insufficiency. He is on diuretic therapy.   His appetite seems to be doing a little bit better. He has had no nausea or vomiting. He's had no diarrhea.  Currently, his performance status is ECOG 2.   Medications:  Allergies as of 12/08/2016      Reactions   Iohexol Other (See Comments)   Pt states that he was told by MD not to ever have contrast again.     Allopurinol Rash      Medication List       Accurate as of 12/08/16 11:40 AM. Always use your most recent med list.          allopurinol 100 MG tablet Commonly known as:  ZYLOPRIM Take 100 mg by mouth daily.   amLODipine 5 MG tablet Commonly known as:  NORVASC Take 5 mg by mouth daily.   aspirin 81 MG EC tablet Take 1 tablet (81 mg total) by mouth daily.   atorvastatin 10 MG tablet Commonly known as:  LIPITOR Take 10 mg by mouth at bedtime.   carvedilol 3.125 MG tablet Commonly known as:  COREG Take 3.125 mg by mouth 2 (two) times daily with a meal.   clonazePAM 0.5 MG tablet Commonly known as:   KLONOPIN Take 1 tablet (0.5 mg total) by mouth 2 (two) times daily as needed for anxiety.   doxazosin 4 MG tablet Commonly known as:  CARDURA Take 8 mg by mouth 2 (two) times daily.   escitalopram 10 MG tablet Commonly known as:  LEXAPRO Take 1 tablet (10 mg total) by mouth daily.   furosemide 80 MG tablet Commonly known as:  LASIX Take 1 tablet (80 mg total) by mouth 2 (two) times daily. Pt takes two tablets in the morning and one at bedtime.   levothyroxine 175 MCG tablet Commonly known as:  SYNTHROID, LEVOTHROID Take 175 mcg by mouth daily before breakfast.   omeprazole 20 MG capsule Commonly known as:  PRILOSEC Take 20 mg by mouth 2 (two) times daily.   sitaGLIPtin 25 MG tablet Commonly known as:  JANUVIA Take 25 mg by mouth daily.   triamcinolone ointment 0.1 % Commonly known as:  KENALOG Apply 1 application topically 2 (two) times daily as needed (for irritation).       Allergies:  Allergies  Allergen Reactions  . Iohexol Other (See Comments)    Pt states that he was told by MD not to ever have contrast again.    . Allopurinol Rash    Past Medical History, Surgical  history, Social history, and Family History were reviewed and updated.  Review of Systems: All other 10 point review of systems is negative.   Physical Exam:  weight is 168 lb (76.2 kg). His oral temperature is 97.7 F (36.5 C). His blood pressure is 153/57 (abnormal) and his pulse is 58 (abnormal). His respiration is 18 and oxygen saturation is 99%.   Wt Readings from Last 3 Encounters:  12/08/16 168 lb (76.2 kg)  11/07/16 175 lb (79.4 kg)  10/24/16 169 lb (76.7 kg)    Thin somewhat disheveled white male in no obvious distress. Head and neck exam shows no ocular or oral lesions.He does have a pustular type rash on the back of his neck.  He has no scleral icterus. There is no palpable cervical or supraclavicular lymph nodes. Lungs are clear bilaterally. Cardiac exam regular rate and rhythm  with no with a 2/6 systolic ejection murmur. Abdomen is soft.  Has a laparotomy scar in the midline. A fluid wave is not noted. There is no guarding or rebound tenderness. There is no palpable hepatomegaly. Extremities shows 2+ chronic edema in his lower legs. Skin exam shows some actinic keratoses. He has his large papular type rash on his abdominal wall. Neurological exam shows no focal neurological deficits.   Lab Results  Component Value Date   WBC 8.3 12/08/2016   HGB 11.3 (L) 12/08/2016   HCT 33.0 (L) 12/08/2016   MCV 101 (H) 12/08/2016   PLT 178 12/08/2016   Lab Results  Component Value Date   FERRITIN 98 10/24/2016   IRON 38 (L) 10/24/2016   TIBC 214 10/24/2016   UIBC 175 10/24/2016   IRONPCTSAT 18 (L) 10/24/2016   Lab Results  Component Value Date   RETICCTPCT 0.9 03/29/2011   RBC 3.26 (L) 12/08/2016   RETICCTABS 31.8 03/29/2011   No results found for: KPAFRELGTCHN, LAMBDASER, KAPLAMBRATIO No results found for: Kandis Cocking, [facility] Lab Results  Component Value Date   TOTALPROTELP 6.7 07/14/2011   ALBUMINELP 56.6 07/14/2011   A1GS 5.7 (H) 07/14/2011   A2GS 12.7 (H) 07/14/2011   BETS 4.9 07/14/2011   BETA2SER 3.8 07/14/2011   GAMS 16.3 07/14/2011   MSPIKE NOT DET 07/14/2011   SPEI * 07/14/2011     Chemistry      Component Value Date/Time   NA 140 12/08/2016 1049   NA 142 02/15/2016 0953   K 4.9 (H) 12/08/2016 1049   K 4.4 02/15/2016 0953   CL 106 12/08/2016 1049   CO2 27 12/08/2016 1049   CO2 23 02/15/2016 0953   BUN 67 (H) 12/08/2016 1049   BUN 47.4 (H) 02/15/2016 0953   CREATININE 3.3 (HH) 12/08/2016 1049   CREATININE 2.6 (H) 02/15/2016 0953      Component Value Date/Time   CALCIUM 9.5 12/08/2016 1049   CALCIUM 9.1 02/15/2016 0953   ALKPHOS 105 (H) 12/08/2016 1049   ALKPHOS 99 02/15/2016 0953   AST 21 12/08/2016 1049   AST 16 02/15/2016 0953   ALT 18 12/08/2016 1049   ALT 7 02/15/2016 0953   BILITOT 0.90 12/08/2016 1049   BILITOT 0.93  02/15/2016 0953      Impression and Plan: Larry [name] is a  76 yo caucasian male with hypotestosteronemia. He also has history of splenic marginal zone lymphoma with splenectomy in 2009. He then completed 1 cycle of Rituxan in April 2010. So far he has done well and there has been no recurrence.   It seems as if the Lexapro  might be helping his overall mental state. His wife says that he is much less volatile emotionally.  Hopefully, his renal function will not be a problem. He does have the chronic renal insufficiency. I'm not sure when he sees the kidney doctor again.   He will see a urologist. He apparently has some bladder dysfunction. He says he will need a Foley catheter for one week. A cystoscopy will then be done.   We will give him his testosterone today. He does not need Aranesp.   We will see him back in one month.   I spent about 30 minutes with he and his wife today.  Volanda Napoleon, MD 8/10/201811:40 AM

## 2016-12-08 NOTE — Patient Instructions (Signed)
Testosterone injection What is this medicine? TESTOSTERONE (tes TOS ter one) is the main male hormone. It supports normal male development such as muscle growth, facial hair, and deep voice. It is used in males to treat low testosterone levels. This medicine may be used for other purposes; ask your health care provider or pharmacist if you have questions. COMMON BRAND NAME(S): Andro-L.A., Aveed, Delatestryl, Depo-Testosterone, Virilon What should I tell my health care provider before I take this medicine? They need to know if you have any of these conditions: -cancer -diabetes -heart disease -kidney disease -liver disease -lung disease -prostate disease -an unusual or allergic reaction to testosterone, other medicines, foods, dyes, or preservatives -pregnant or trying to get pregnant -breast-feeding How should I use this medicine? This medicine is for injection into a muscle. It is usually given by a health care professional in a hospital or clinic setting. Contact your pediatrician regarding the use of this medicine in children. While this medicine may be prescribed for children as young as 12 years of age for selected conditions, precautions do apply. Overdosage: If you think you have taken too much of this medicine contact a poison control center or emergency room at once. NOTE: This medicine is only for you. Do not share this medicine with others. What if I miss a dose? Try not to miss a dose. Your doctor or health care professional will tell you when your next injection is due. Notify the office if you are unable to keep an appointment. What may interact with this medicine? -medicines for diabetes -medicines that treat or prevent blood clots like warfarin -oxyphenbutazone -propranolol -steroid medicines like prednisone or cortisone This list may not describe all possible interactions. Give your health care provider a list of all the medicines, herbs, non-prescription drugs, or  dietary supplements you use. Also tell them if you smoke, drink alcohol, or use illegal drugs. Some items may interact with your medicine. What should I watch for while using this medicine? Visit your doctor or health care professional for regular checks on your progress. They will need to check the level of testosterone in your blood. This medicine is only approved for use in men who have low levels of testosterone related to certain medical conditions. Heart attacks and strokes have been reported with the use of this medicine. Notify your doctor or health care professional and seek emergency treatment if you develop breathing problems; changes in vision; confusion; chest pain or chest tightness; sudden arm pain; severe, sudden headache; trouble speaking or understanding; sudden numbness or weakness of the face, arm or leg; loss of balance or coordination. Talk to your doctor about the risks and benefits of this medicine. This medicine may affect blood sugar levels. If you have diabetes, check with your doctor or health care professional before you change your diet or the dose of your diabetic medicine. Testosterone injections are not commonly used in women. Women should inform their doctor if they wish to become pregnant or think they might be pregnant. There is a potential for serious side effects to an unborn child. Talk to your health care professional or pharmacist for more information. Talk with your doctor or health care professional about your birth control options while taking this medicine. This drug is banned from use in athletes by most athletic organizations. What side effects may I notice from receiving this medicine? Side effects that you should report to your doctor or health care professional as soon as possible: -allergic reactions like skin rash,   itching or hives, swelling of the face, lips, or tongue -breast enlargement -breathing problems -changes in emotions or moods -deep or  hoarse voice -irregular menstrual periods -signs and symptoms of liver injury like dark yellow or brown urine; general ill feeling or flu-like symptoms; light-colored stools; loss of appetite; nausea; right upper belly pain; unusually weak or tired; yellowing of the eyes or skin -stomach pain -swelling of the ankles, feet, hands -too frequent or persistent erections -trouble passing urine or change in the amount of urine Side effects that usually do not require medical attention (report to your doctor or health care professional if they continue or are bothersome): -acne -change in sex drive or performance -facial hair growth -hair loss -headache This list may not describe all possible side effects. Call your doctor for medical advice about side effects. You may report side effects to FDA at 1-800-FDA-1088. Where should I keep my medicine? Keep out of the reach of children. This medicine can be abused. Keep your medicine in a safe place to protect it from theft. Do not share this medicine with anyone. Selling or giving away this medicine is dangerous and against the law. Store at room temperature between 20 and 25 degrees C (68 and 77 degrees F). Do not freeze. Protect from light. Follow the directions for the product you are prescribed. Throw away any unused medicine after the expiration date. NOTE: This sheet is a summary. It may not cover all possible information. If you have questions about this medicine, talk to your doctor, pharmacist, or health care provider.  2018 Elsevier/Gold Standard (2015-05-22 07:33:55)  

## 2016-12-09 LAB — TESTOSTERONE: Testosterone, Serum: 138 ng/dL — ABNORMAL LOW (ref 264–916)

## 2016-12-18 DIAGNOSIS — L309 Dermatitis, unspecified: Secondary | ICD-10-CM | POA: Diagnosis not present

## 2016-12-20 DIAGNOSIS — N1339 Other hydronephrosis: Secondary | ICD-10-CM | POA: Diagnosis not present

## 2016-12-21 DIAGNOSIS — N1330 Unspecified hydronephrosis: Secondary | ICD-10-CM | POA: Diagnosis not present

## 2016-12-28 DIAGNOSIS — N139 Obstructive and reflux uropathy, unspecified: Secondary | ICD-10-CM | POA: Diagnosis not present

## 2016-12-28 DIAGNOSIS — N281 Cyst of kidney, acquired: Secondary | ICD-10-CM | POA: Diagnosis not present

## 2016-12-28 DIAGNOSIS — N401 Enlarged prostate with lower urinary tract symptoms: Secondary | ICD-10-CM | POA: Diagnosis not present

## 2016-12-28 DIAGNOSIS — N1339 Other hydronephrosis: Secondary | ICD-10-CM | POA: Diagnosis not present

## 2016-12-28 DIAGNOSIS — N184 Chronic kidney disease, stage 4 (severe): Secondary | ICD-10-CM | POA: Diagnosis not present

## 2017-01-04 DIAGNOSIS — R0989 Other specified symptoms and signs involving the circulatory and respiratory systems: Secondary | ICD-10-CM | POA: Diagnosis not present

## 2017-01-09 DIAGNOSIS — M10021 Idiopathic gout, right elbow: Secondary | ICD-10-CM | POA: Diagnosis not present

## 2017-01-09 DIAGNOSIS — Z23 Encounter for immunization: Secondary | ICD-10-CM | POA: Diagnosis not present

## 2017-01-17 DIAGNOSIS — I10 Essential (primary) hypertension: Secondary | ICD-10-CM | POA: Diagnosis not present

## 2017-01-17 DIAGNOSIS — N184 Chronic kidney disease, stage 4 (severe): Secondary | ICD-10-CM | POA: Diagnosis not present

## 2017-01-17 DIAGNOSIS — R55 Syncope and collapse: Secondary | ICD-10-CM | POA: Diagnosis not present

## 2017-01-17 DIAGNOSIS — I5032 Chronic diastolic (congestive) heart failure: Secondary | ICD-10-CM | POA: Diagnosis not present

## 2017-01-19 ENCOUNTER — Telehealth: Payer: Self-pay | Admitting: *Deleted

## 2017-01-19 ENCOUNTER — Ambulatory Visit (HOSPITAL_BASED_OUTPATIENT_CLINIC_OR_DEPARTMENT_OTHER): Payer: Medicare Other

## 2017-01-19 ENCOUNTER — Ambulatory Visit (HOSPITAL_BASED_OUTPATIENT_CLINIC_OR_DEPARTMENT_OTHER): Payer: Medicare Other | Admitting: Hematology & Oncology

## 2017-01-19 ENCOUNTER — Other Ambulatory Visit (HOSPITAL_BASED_OUTPATIENT_CLINIC_OR_DEPARTMENT_OTHER): Payer: Medicare Other

## 2017-01-19 VITALS — BP 142/46 | HR 55 | Temp 97.5°F | Resp 17 | Wt 163.0 lb

## 2017-01-19 DIAGNOSIS — D631 Anemia in chronic kidney disease: Secondary | ICD-10-CM

## 2017-01-19 DIAGNOSIS — N184 Chronic kidney disease, stage 4 (severe): Secondary | ICD-10-CM

## 2017-01-19 DIAGNOSIS — F4310 Post-traumatic stress disorder, unspecified: Secondary | ICD-10-CM | POA: Diagnosis not present

## 2017-01-19 DIAGNOSIS — E291 Testicular hypofunction: Secondary | ICD-10-CM | POA: Diagnosis not present

## 2017-01-19 DIAGNOSIS — N189 Chronic kidney disease, unspecified: Secondary | ICD-10-CM

## 2017-01-19 DIAGNOSIS — E349 Endocrine disorder, unspecified: Secondary | ICD-10-CM

## 2017-01-19 DIAGNOSIS — N400 Enlarged prostate without lower urinary tract symptoms: Secondary | ICD-10-CM

## 2017-01-19 DIAGNOSIS — C8307 Small cell B-cell lymphoma, spleen: Secondary | ICD-10-CM

## 2017-01-19 DIAGNOSIS — N185 Chronic kidney disease, stage 5: Secondary | ICD-10-CM

## 2017-01-19 DIAGNOSIS — F419 Anxiety disorder, unspecified: Secondary | ICD-10-CM

## 2017-01-19 DIAGNOSIS — Z8572 Personal history of non-Hodgkin lymphomas: Secondary | ICD-10-CM

## 2017-01-19 DIAGNOSIS — N179 Acute kidney failure, unspecified: Secondary | ICD-10-CM

## 2017-01-19 LAB — CMP (CANCER CENTER ONLY)
ALBUMIN: 2.9 g/dL — AB (ref 3.3–5.5)
ALK PHOS: 98 U/L — AB (ref 26–84)
ALT: 21 U/L (ref 10–47)
AST: 23 U/L (ref 11–38)
BUN, Bld: 61 mg/dL — ABNORMAL HIGH (ref 7–22)
CALCIUM: 9 mg/dL (ref 8.0–10.3)
CO2: 24 mEq/L (ref 18–33)
Chloride: 106 mEq/L (ref 98–108)
Creat: 3.3 mg/dl (ref 0.6–1.2)
Glucose, Bld: 181 mg/dL — ABNORMAL HIGH (ref 73–118)
POTASSIUM: 4.6 meq/L (ref 3.3–4.7)
Sodium: 140 mEq/L (ref 128–145)
TOTAL PROTEIN: 6.7 g/dL (ref 6.4–8.1)
Total Bilirubin: 0.6 mg/dl (ref 0.20–1.60)

## 2017-01-19 LAB — CBC WITH DIFFERENTIAL (CANCER CENTER ONLY)
BASO#: 0 10*3/uL (ref 0.0–0.2)
BASO%: 0.3 % (ref 0.0–2.0)
EOS ABS: 1.5 10*3/uL — AB (ref 0.0–0.5)
EOS%: 13.2 % — ABNORMAL HIGH (ref 0.0–7.0)
HEMATOCRIT: 27.4 % — AB (ref 38.7–49.9)
HGB: 9.4 g/dL — ABNORMAL LOW (ref 13.0–17.1)
LYMPH#: 1.3 10*3/uL (ref 0.9–3.3)
LYMPH%: 12.2 % — ABNORMAL LOW (ref 14.0–48.0)
MCH: 34.6 pg — ABNORMAL HIGH (ref 28.0–33.4)
MCHC: 34.3 g/dL (ref 32.0–35.9)
MCV: 101 fL — AB (ref 82–98)
MONO#: 1.7 10*3/uL — AB (ref 0.1–0.9)
MONO%: 15.7 % — ABNORMAL HIGH (ref 0.0–13.0)
NEUT#: 6.4 10*3/uL (ref 1.5–6.5)
NEUT%: 58.6 % (ref 40.0–80.0)
Platelets: 272 10*3/uL (ref 145–400)
RBC: 2.72 10*6/uL — AB (ref 4.20–5.70)
RDW: 15 % (ref 11.1–15.7)
WBC: 11 10*3/uL — AB (ref 4.0–10.0)

## 2017-01-19 LAB — FERRITIN: FERRITIN: 402 ng/mL — AB (ref 22–316)

## 2017-01-19 LAB — IRON AND TIBC
%SAT: 42 % (ref 20–55)
IRON: 71 ug/dL (ref 42–163)
TIBC: 170 ug/dL — ABNORMAL LOW (ref 202–409)
UIBC: 99 ug/dL — ABNORMAL LOW (ref 117–376)

## 2017-01-19 MED ORDER — DARBEPOETIN ALFA 300 MCG/0.6ML IJ SOSY
300.0000 ug | PREFILLED_SYRINGE | Freq: Once | INTRAMUSCULAR | Status: AC
  Administered 2017-01-19: 300 ug via SUBCUTANEOUS

## 2017-01-19 MED ORDER — TESTOSTERONE CYPIONATE 200 MG/ML IM SOLN
INTRAMUSCULAR | Status: AC
  Filled 2017-01-19: qty 1

## 2017-01-19 MED ORDER — TESTOSTERONE CYPIONATE 200 MG/ML IM SOLN
400.0000 mg | INTRAMUSCULAR | Status: DC
  Administered 2017-01-19: 400 mg via INTRAMUSCULAR

## 2017-01-19 MED ORDER — DARBEPOETIN ALFA 300 MCG/0.6ML IJ SOSY
PREFILLED_SYRINGE | INTRAMUSCULAR | Status: AC
  Filled 2017-01-19: qty 0.6

## 2017-01-19 NOTE — Patient Instructions (Signed)
Darbepoetin Alfa injection What is this medicine? DARBEPOETIN ALFA (dar be POE e tin AL fa) helps your body make more red blood cells. It is used to treat anemia caused by chronic kidney failure and chemotherapy. This medicine may be used for other purposes; ask your health care provider or pharmacist if you have questions. COMMON BRAND NAME(S): Aranesp What should I tell my health care provider before I take this medicine? They need to know if you have any of these conditions: -blood clotting disorders or history of blood clots -cancer patient not on chemotherapy -cystic fibrosis -heart disease, such as angina, heart failure, or a history of a heart attack -hemoglobin level of 12 g/dL or greater -high blood pressure -low levels of folate, iron, or vitamin B12 -seizures -an unusual or allergic reaction to darbepoetin, erythropoietin, albumin, hamster proteins, latex, other medicines, foods, dyes, or preservatives -pregnant or trying to get pregnant -breast-feeding How should I use this medicine? This medicine is for injection into a vein or under the skin. It is usually given by a health care professional in a hospital or clinic setting. If you get this medicine at home, you will be taught how to prepare and give this medicine. Use exactly as directed. Take your medicine at regular intervals. Do not take your medicine more often than directed. It is important that you put your used needles and syringes in a special sharps container. Do not put them in a trash can. If you do not have a sharps container, call your pharmacist or healthcare provider to get one. A special MedGuide will be given to you by the pharmacist with each prescription and refill. Be sure to read this information carefully each time. Talk to your pediatrician regarding the use of this medicine in children. While this medicine may be used in children as young as 1 year for selected conditions, precautions do  apply. Overdosage: If you think you have taken too much of this medicine contact a poison control center or emergency room at once. NOTE: This medicine is only for you. Do not share this medicine with others. What if I miss a dose? If you miss a dose, take it as soon as you can. If it is almost time for your next dose, take only that dose. Do not take double or extra doses. What may interact with this medicine? Do not take this medicine with any of the following medications: -epoetin alfa This list may not describe all possible interactions. Give your health care provider a list of all the medicines, herbs, non-prescription drugs, or dietary supplements you use. Also tell them if you smoke, drink alcohol, or use illegal drugs. Some items may interact with your medicine. What should I watch for while using this medicine? Your condition will be monitored carefully while you are receiving this medicine. You may need blood work done while you are taking this medicine. What side effects may I notice from receiving this medicine? Side effects that you should report to your doctor or health care professional as soon as possible: -allergic reactions like skin rash, itching or hives, swelling of the face, lips, or tongue -breathing problems -changes in vision -chest pain -confusion, trouble speaking or understanding -feeling faint or lightheaded, falls -high blood pressure -muscle aches or pains -pain, swelling, warmth in the leg -rapid weight gain -severe headaches -sudden numbness or weakness of the face, arm or leg -trouble walking, dizziness, loss of balance or coordination -seizures (convulsions) -swelling of the ankles, feet, hands -  unusually weak or tired Side effects that usually do not require medical attention (report to your doctor or health care professional if they continue or are bothersome): -diarrhea -fever, chills (flu-like symptoms) -headaches -nausea, vomiting -redness,  stinging, or swelling at site where injected This list may not describe all possible side effects. Call your doctor for medical advice about side effects. You may report side effects to FDA at 1-800-FDA-1088. Where should I keep my medicine? Keep out of the reach of children. Store in a refrigerator between 2 and 8 degrees C (36 and 46 degrees F). Do not freeze. Do not shake. Throw away any unused portion if using a single-dose vial. Throw away any unused medicine after the expiration date. NOTE: This sheet is a summary. It may not cover all possible information. If you have questions about this medicine, talk to your doctor, pharmacist, or health care provider.  2018 Elsevier/Gold Standard (2015-12-06 19:52:26)  Testosterone injection What is this medicine? TESTOSTERONE (tes TOS ter one) is the main male hormone. It supports normal male development such as muscle growth, facial hair, and deep voice. It is used in males to treat low testosterone levels. This medicine may be used for other purposes; ask your health care provider or pharmacist if you have questions. COMMON BRAND NAME(S): Andro-L.A., Aveed, Delatestryl, Depo-Testosterone, Virilon What should I tell my health care provider before I take this medicine? They need to know if you have any of these conditions: -cancer -diabetes -heart disease -kidney disease -liver disease -lung disease -prostate disease -an unusual or allergic reaction to testosterone, other medicines, foods, dyes, or preservatives -pregnant or trying to get pregnant -breast-feeding How should I use this medicine? This medicine is for injection into a muscle. It is usually given by a health care professional in a hospital or clinic setting. Contact your pediatrician regarding the use of this medicine in children. While this medicine may be prescribed for children as young as 45 years of age for selected conditions, precautions do apply. Overdosage: If you think  you have taken too much of this medicine contact a poison control center or emergency room at once. NOTE: This medicine is only for you. Do not share this medicine with others. What if I miss a dose? Try not to miss a dose. Your doctor or health care professional will tell you when your next injection is due. Notify the office if you are unable to keep an appointment. What may interact with this medicine? -medicines for diabetes -medicines that treat or prevent blood clots like warfarin -oxyphenbutazone -propranolol -steroid medicines like prednisone or cortisone This list may not describe all possible interactions. Give your health care provider a list of all the medicines, herbs, non-prescription drugs, or dietary supplements you use. Also tell them if you smoke, drink alcohol, or use illegal drugs. Some items may interact with your medicine. What should I watch for while using this medicine? Visit your doctor or health care professional for regular checks on your progress. They will need to check the level of testosterone in your blood. This medicine is only approved for use in men who have low levels of testosterone related to certain medical conditions. Heart attacks and strokes have been reported with the use of this medicine. Notify your doctor or health care professional and seek emergency treatment if you develop breathing problems; changes in vision; confusion; chest pain or chest tightness; sudden arm pain; severe, sudden headache; trouble speaking or understanding; sudden numbness or weakness of the face,  arm or leg; loss of balance or coordination. Talk to your doctor about the risks and benefits of this medicine. This medicine may affect blood sugar levels. If you have diabetes, check with your doctor or health care professional before you change your diet or the dose of your diabetic medicine. Testosterone injections are not commonly used in women. Women should inform their doctor if  they wish to become pregnant or think they might be pregnant. There is a potential for serious side effects to an unborn child. Talk to your health care professional or pharmacist for more information. Talk with your doctor or health care professional about your birth control options while taking this medicine. This drug is banned from use in athletes by most athletic organizations. What side effects may I notice from receiving this medicine? Side effects that you should report to your doctor or health care professional as soon as possible: -allergic reactions like skin rash, itching or hives, swelling of the face, lips, or tongue -breast enlargement -breathing problems -changes in emotions or moods -deep or hoarse voice -irregular menstrual periods -signs and symptoms of liver injury like dark yellow or brown urine; general ill feeling or flu-like symptoms; light-colored stools; loss of appetite; nausea; right upper belly pain; unusually weak or tired; yellowing of the eyes or skin -stomach pain -swelling of the ankles, feet, hands -too frequent or persistent erections -trouble passing urine or change in the amount of urine Side effects that usually do not require medical attention (report to your doctor or health care professional if they continue or are bothersome): -acne -change in sex drive or performance -facial hair growth -hair loss -headache This list may not describe all possible side effects. Call your doctor for medical advice about side effects. You may report side effects to FDA at 1-800-FDA-1088. Where should I keep my medicine? Keep out of the reach of children. This medicine can be abused. Keep your medicine in a safe place to protect it from theft. Do not share this medicine with anyone. Selling or giving away this medicine is dangerous and against the law. Store at room temperature between 20 and 25 degrees C (68 and 77 degrees F). Do not freeze. Protect from light. Follow  the directions for the product you are prescribed. Throw away any unused medicine after the expiration date. NOTE: This sheet is a summary. It may not cover all possible information. If you have questions about this medicine, talk to your doctor, pharmacist, or health care provider.  2018 Elsevier/Gold Standard (2015-05-22 07:33:55)

## 2017-01-19 NOTE — Progress Notes (Signed)
Hematology and Oncology Follow Up Visit  Larry [name] [id number] 03-07-1941 76 y.o. 01/19/2017   Principle Diagnosis:  1. 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 Chronic renal insufficiency 4. Anemia of renal insufficiency  Current Therapy:   Testosterone 400 mg IM once a month  Aranesp 300 g subcutaneous every 4 weeks for hemoglobin less than 10   Interim History:  Larry [name] is here today with his wife for follow-up. He really seems like his "old self.". I hope that the Lexapro is helping.  He has a Foley catheter in. He apparently has some bladder distention. He has what appears to be prostatic enlargement. He had a CT scan done about 3 months ago which showed bilateral hydronephrosis and some hydroureter. I'm sure that this was from his outlet obstruction.  He is more active. He is not as "cranky".  His appetite is doing better. He's had no nausea or vomiting.  He received saw the cardiologist. He sees his nephrologist next month.   Overall, his performance status is ECOG 2.   Medications:  Allergies as of 01/19/2017      Reactions   Iohexol Other (See Comments)   Pt states that he was told by MD not to ever have contrast again.     Allopurinol Rash      Medication List       Accurate as of 01/19/17 11:59 AM. Always use your most recent med list.          allopurinol 100 MG tablet Commonly known as:  ZYLOPRIM Take 100 mg by mouth daily.   amLODipine 5 MG tablet Commonly known as:  NORVASC Take 5 mg by mouth daily.   aspirin 81 MG EC tablet Take 1 tablet (81 mg total) by mouth daily.   atorvastatin 10 MG tablet Commonly known as:  LIPITOR Take 10 mg by mouth at bedtime.   carvedilol 3.125 MG tablet Commonly known as:  COREG Take 3.125 mg by mouth 2 (two) times daily with a meal.   clonazePAM 0.5 MG tablet Commonly known as:  KLONOPIN Take 1 tablet (0.5 mg total) by mouth 2 (two) times daily as needed  for anxiety.   doxazosin 8 MG tablet Commonly known as:  CARDURA Take 8 mg by mouth.   escitalopram 10 MG tablet Commonly known as:  LEXAPRO Take 1 tablet (10 mg total) by mouth daily.   finasteride 5 MG tablet Commonly known as:  PROSCAR   fluocinonide cream 0.05 % Commonly known as:  LIDEX   furosemide 80 MG tablet Commonly known as:  LASIX Take 1 tablet (80 mg total) by mouth 2 (two) times daily. Pt takes two tablets in the morning and one at bedtime.   JANUVIA 50 MG tablet Generic drug:  sitaGLIPtin   levothyroxine 175 MCG tablet Commonly known as:  SYNTHROID, LEVOTHROID Take 175 mcg by mouth daily before breakfast.   omeprazole 20 MG capsule Commonly known as:  PRILOSEC Take 20 mg by mouth 2 (two) times daily.   tamsulosin 0.4 MG Caps capsule Commonly known as:  FLOMAX   triamcinolone ointment 0.1 % Commonly known as:  KENALOG Apply 1 application topically 2 (two) times daily as needed (for irritation).       Allergies:  Allergies  Allergen Reactions  . Iohexol Other (See Comments)    Pt states that he was told by MD not to ever have contrast again.    . Allopurinol Rash    Past  Medical History, Surgical history, Social history, and Family History were reviewed and updated.  Review of Systems: As stated in the interim history.   Physical Exam:  weight is 163 lb (73.9 kg). His oral temperature is 97.5 F (36.4 C) (abnormal). His blood pressure is 142/46 (abnormal) and his pulse is 55 (abnormal). His respiration is 17 and oxygen saturation is 98%.   Wt Readings from Last 3 Encounters:  01/19/17 163 lb (73.9 kg)  12/08/16 168 lb (76.2 kg)  11/07/16 175 lb (79.4 kg)    I examined Larry [name] today. The results of the examination are documented below with the appropriate changes made:   Thin somewhat disheveled white male in no obvious distress. Head and neck exam shows no ocular or oral lesions.He does have a pustular type rash on the back of his  neck.  He has no scleral icterus. There is no palpable cervical or supraclavicular lymph nodes. Lungs are clear bilaterally. Cardiac exam regular rate and rhythm with no with a 2/6 systolic ejection murmur. Abdomen is soft.  Has a laparotomy scar in the midline. A fluid wave is not noted. There is no guarding or rebound tenderness. There is no palpable hepatomegaly. Extremities shows 2+ chronic edema in his lower legs. Skin exam shows some actinic keratoses. He has a papular type rash on his abdominal wall. This actually looks a little bit better. His genital exam does show the Foley catheter with clear urine. Neurological exam shows no focal neurological deficits.   Lab Results  Component Value Date   WBC 11.0 (H) 01/19/2017   HGB 9.4 (L) 01/19/2017   HCT 27.4 (L) 01/19/2017   MCV 101 (H) 01/19/2017   PLT 272 01/19/2017   Lab Results  Component Value Date   FERRITIN 360 (H) 12/08/2016   IRON 126 12/08/2016   TIBC 184 (L) 12/08/2016   UIBC 58 (L) 12/08/2016   IRONPCTSAT 68 (H) 12/08/2016   Lab Results  Component Value Date   RETICCTPCT 0.9 03/29/2011   RBC 2.72 (L) 01/19/2017   RETICCTABS 31.8 03/29/2011   No results found for: KPAFRELGTCHN, LAMBDASER, KAPLAMBRATIO No results found for: Kandis Cocking, [facility] Lab Results  Component Value Date   TOTALPROTELP 6.7 07/14/2011   ALBUMINELP 56.6 07/14/2011   A1GS 5.7 (H) 07/14/2011   A2GS 12.7 (H) 07/14/2011   BETS 4.9 07/14/2011   BETA2SER 3.8 07/14/2011   GAMS 16.3 07/14/2011   MSPIKE NOT DET 07/14/2011   SPEI * 07/14/2011     Chemistry      Component Value Date/Time   NA 140 01/19/2017 1049   NA 142 02/15/2016 0953   K 4.6 01/19/2017 1049   K 4.4 02/15/2016 0953   CL 106 01/19/2017 1049   CO2 24 01/19/2017 1049   CO2 23 02/15/2016 0953   BUN 61 (H) 01/19/2017 1049   BUN 47.4 (H) 02/15/2016 0953   CREATININE 3.3 (HH) 01/19/2017 1049   CREATININE 2.6 (H) 02/15/2016 0953      Component Value Date/Time   CALCIUM 9.0  01/19/2017 1049   CALCIUM 9.1 02/15/2016 0953   ALKPHOS 98 (H) 01/19/2017 1049   ALKPHOS 99 02/15/2016 0953   AST 23 01/19/2017 1049   AST 16 02/15/2016 0953   ALT 21 01/19/2017 1049   ALT 7 02/15/2016 0953   BILITOT 0.60 01/19/2017 1049   BILITOT 0.93 02/15/2016 0953      Impression and Plan: Larry [name] is a  76 yo caucasian male with hypotestosteronemia. He also has  history of splenic marginal zone lymphoma with splenectomy in 2009. He then completed 1 cycle of Rituxan in April 2010. So far he has done well and there has been no recurrence.   From a behavioral point of view, he really seems to be doing nicely. Hopefully, the Lexapro is making a difference.  He will get his Aranesp today. He will clearly need Aranesp on a regular basis, I believe, because of his renal insufficiency.  He has the catheter now. He is on medicine try to help with his prostate enlargement.  We will plan to get him back in another month or so. I think we have to follow him along pretty closely.  Thankfully, the rash on his abdominal wall looks a little bit better.    Volanda Napoleon, MD 9/21/201811:59 AM

## 2017-01-19 NOTE — Telephone Encounter (Signed)
Received call from Richardson Landry in lab that Creatinine is 3.3.  Dr Marin Olp aware. Seeing patient in office now

## 2017-01-20 LAB — TESTOSTERONE: Testosterone, Serum: 170 ng/dL — ABNORMAL LOW (ref 264–916)

## 2017-01-20 LAB — RETICULOCYTES: RETICULOCYTE COUNT: 1 % (ref 0.6–2.6)

## 2017-01-25 DIAGNOSIS — R338 Other retention of urine: Secondary | ICD-10-CM | POA: Diagnosis not present

## 2017-01-25 DIAGNOSIS — N1330 Unspecified hydronephrosis: Secondary | ICD-10-CM | POA: Diagnosis not present

## 2017-02-06 DIAGNOSIS — N185 Chronic kidney disease, stage 5: Secondary | ICD-10-CM | POA: Diagnosis not present

## 2017-02-06 DIAGNOSIS — Z6827 Body mass index (BMI) 27.0-27.9, adult: Secondary | ICD-10-CM | POA: Diagnosis not present

## 2017-02-06 DIAGNOSIS — R600 Localized edema: Secondary | ICD-10-CM | POA: Diagnosis not present

## 2017-02-06 DIAGNOSIS — N2581 Secondary hyperparathyroidism of renal origin: Secondary | ICD-10-CM | POA: Diagnosis not present

## 2017-02-06 DIAGNOSIS — N1330 Unspecified hydronephrosis: Secondary | ICD-10-CM | POA: Diagnosis not present

## 2017-02-06 DIAGNOSIS — M109 Gout, unspecified: Secondary | ICD-10-CM | POA: Diagnosis not present

## 2017-02-06 DIAGNOSIS — I120 Hypertensive chronic kidney disease with stage 5 chronic kidney disease or end stage renal disease: Secondary | ICD-10-CM | POA: Diagnosis not present

## 2017-02-07 DIAGNOSIS — D631 Anemia in chronic kidney disease: Secondary | ICD-10-CM | POA: Diagnosis not present

## 2017-02-07 DIAGNOSIS — N1330 Unspecified hydronephrosis: Secondary | ICD-10-CM | POA: Diagnosis not present

## 2017-02-07 DIAGNOSIS — C8307 Small cell B-cell lymphoma, spleen: Secondary | ICD-10-CM | POA: Diagnosis not present

## 2017-02-07 DIAGNOSIS — M1A09X0 Idiopathic chronic gout, multiple sites, without tophus (tophi): Secondary | ICD-10-CM | POA: Diagnosis not present

## 2017-02-07 DIAGNOSIS — I5032 Chronic diastolic (congestive) heart failure: Secondary | ICD-10-CM | POA: Diagnosis not present

## 2017-02-07 DIAGNOSIS — I10 Essential (primary) hypertension: Secondary | ICD-10-CM | POA: Diagnosis not present

## 2017-02-07 DIAGNOSIS — N189 Chronic kidney disease, unspecified: Secondary | ICD-10-CM | POA: Diagnosis not present

## 2017-02-07 DIAGNOSIS — N2581 Secondary hyperparathyroidism of renal origin: Secondary | ICD-10-CM | POA: Diagnosis not present

## 2017-02-07 DIAGNOSIS — E1121 Type 2 diabetes mellitus with diabetic nephropathy: Secondary | ICD-10-CM | POA: Diagnosis not present

## 2017-02-07 DIAGNOSIS — I779 Disorder of arteries and arterioles, unspecified: Secondary | ICD-10-CM | POA: Diagnosis not present

## 2017-02-07 DIAGNOSIS — I359 Nonrheumatic aortic valve disorder, unspecified: Secondary | ICD-10-CM | POA: Diagnosis not present

## 2017-02-07 DIAGNOSIS — K219 Gastro-esophageal reflux disease without esophagitis: Secondary | ICD-10-CM | POA: Diagnosis not present

## 2017-02-07 MED FILL — TRIAMCINOLONE 0.1% CREAM: 0.1 | 30 days supply | Qty: 205934 | Fill #1

## 2017-02-07 MED FILL — JANUVIA 50 MG TABLET: 50 | 30 days supply | Qty: 30 | Fill #1

## 2017-02-15 DIAGNOSIS — N401 Enlarged prostate with lower urinary tract symptoms: Secondary | ICD-10-CM | POA: Diagnosis not present

## 2017-02-15 DIAGNOSIS — R338 Other retention of urine: Secondary | ICD-10-CM | POA: Diagnosis not present

## 2017-02-19 ENCOUNTER — Ambulatory Visit (HOSPITAL_BASED_OUTPATIENT_CLINIC_OR_DEPARTMENT_OTHER): Payer: Medicare Other

## 2017-02-19 ENCOUNTER — Ambulatory Visit (HOSPITAL_BASED_OUTPATIENT_CLINIC_OR_DEPARTMENT_OTHER): Payer: Medicare Other | Admitting: Family

## 2017-02-19 ENCOUNTER — Other Ambulatory Visit (HOSPITAL_BASED_OUTPATIENT_CLINIC_OR_DEPARTMENT_OTHER): Payer: Medicare Other

## 2017-02-19 VITALS — BP 155/67 | HR 65 | Temp 97.6°F | Resp 16 | Wt 164.0 lb

## 2017-02-19 DIAGNOSIS — N189 Chronic kidney disease, unspecified: Secondary | ICD-10-CM | POA: Diagnosis not present

## 2017-02-19 DIAGNOSIS — C8307 Small cell B-cell lymphoma, spleen: Secondary | ICD-10-CM

## 2017-02-19 DIAGNOSIS — D631 Anemia in chronic kidney disease: Secondary | ICD-10-CM

## 2017-02-19 DIAGNOSIS — N184 Chronic kidney disease, stage 4 (severe): Secondary | ICD-10-CM | POA: Diagnosis not present

## 2017-02-19 DIAGNOSIS — E291 Testicular hypofunction: Secondary | ICD-10-CM

## 2017-02-19 DIAGNOSIS — E349 Endocrine disorder, unspecified: Secondary | ICD-10-CM

## 2017-02-19 DIAGNOSIS — Z8572 Personal history of non-Hodgkin lymphomas: Secondary | ICD-10-CM

## 2017-02-19 DIAGNOSIS — D509 Iron deficiency anemia, unspecified: Secondary | ICD-10-CM

## 2017-02-19 LAB — CMP (CANCER CENTER ONLY)
ALBUMIN: 2.8 g/dL — AB (ref 3.3–5.5)
ALT(SGPT): 16 U/L (ref 10–47)
AST: 21 U/L (ref 11–38)
Alkaline Phosphatase: 101 U/L — ABNORMAL HIGH (ref 26–84)
BILIRUBIN TOTAL: 0.6 mg/dL (ref 0.20–1.60)
BUN: 45 mg/dL — AB (ref 7–22)
CHLORIDE: 106 meq/L (ref 98–108)
CO2: 29 meq/L (ref 18–33)
CREATININE: 2.5 mg/dL — AB (ref 0.6–1.2)
Calcium: 9.3 mg/dL (ref 8.0–10.3)
GLUCOSE: 193 mg/dL — AB (ref 73–118)
Potassium: 4.8 mEq/L — ABNORMAL HIGH (ref 3.3–4.7)
SODIUM: 144 meq/L (ref 128–145)
Total Protein: 7.8 g/dL (ref 6.4–8.1)

## 2017-02-19 LAB — CBC WITH DIFFERENTIAL (CANCER CENTER ONLY)
BASO#: 0 10*3/uL (ref 0.0–0.2)
BASO%: 0.2 % (ref 0.0–2.0)
EOS%: 9.9 % — AB (ref 0.0–7.0)
Eosinophils Absolute: 0.9 10*3/uL — ABNORMAL HIGH (ref 0.0–0.5)
HCT: 27.6 % — ABNORMAL LOW (ref 38.7–49.9)
HEMOGLOBIN: 9.2 g/dL — AB (ref 13.0–17.1)
LYMPH#: 1.2 10*3/uL (ref 0.9–3.3)
LYMPH%: 12.2 % — AB (ref 14.0–48.0)
MCH: 33.3 pg (ref 28.0–33.4)
MCHC: 33.3 g/dL (ref 32.0–35.9)
MCV: 100 fL — ABNORMAL HIGH (ref 82–98)
MONO#: 1.5 10*3/uL — ABNORMAL HIGH (ref 0.1–0.9)
MONO%: 15.4 % — AB (ref 0.0–13.0)
NEUT%: 62.3 % (ref 40.0–80.0)
NEUTROS ABS: 5.9 10*3/uL (ref 1.5–6.5)
PLATELETS: 299 10*3/uL (ref 145–400)
RBC: 2.76 10*6/uL — ABNORMAL LOW (ref 4.20–5.70)
RDW: 16.2 % — AB (ref 11.1–15.7)
WBC: 9.4 10*3/uL (ref 4.0–10.0)

## 2017-02-19 LAB — IRON AND TIBC
%SAT: 26 % (ref 20–55)
Iron: 44 ug/dL (ref 42–163)
TIBC: 169 ug/dL — ABNORMAL LOW (ref 202–409)
UIBC: 124 ug/dL (ref 117–376)

## 2017-02-19 LAB — FERRITIN: Ferritin: 393 ng/ml — ABNORMAL HIGH (ref 22–316)

## 2017-02-19 MED ORDER — DARBEPOETIN ALFA 300 MCG/0.6ML IJ SOSY
300.0000 ug | PREFILLED_SYRINGE | Freq: Once | INTRAMUSCULAR | Status: AC
  Administered 2017-02-19: 300 ug via SUBCUTANEOUS

## 2017-02-19 MED ORDER — TESTOSTERONE CYPIONATE 200 MG/ML IM SOLN
400.0000 mg | INTRAMUSCULAR | Status: DC
  Administered 2017-02-19: 400 mg via INTRAMUSCULAR

## 2017-02-19 MED ORDER — TESTOSTERONE CYPIONATE 200 MG/ML IM SOLN
INTRAMUSCULAR | Status: AC
  Filled 2017-02-19: qty 2

## 2017-02-19 MED ORDER — DARBEPOETIN ALFA 300 MCG/0.6ML IJ SOSY
PREFILLED_SYRINGE | INTRAMUSCULAR | Status: AC
  Filled 2017-02-19: qty 0.6

## 2017-02-19 NOTE — Patient Instructions (Signed)
Testosterone injection What is this medicine? TESTOSTERONE (tes TOS ter one) is the main male hormone. It supports normal male development such as muscle growth, facial hair, and deep voice. It is used in males to treat low testosterone levels. This medicine may be used for other purposes; ask your health care provider or pharmacist if you have questions. COMMON BRAND NAME(S): Andro-L.A., Aveed, Delatestryl, Depo-Testosterone, Virilon What should I tell my health care provider before I take this medicine? They need to know if you have any of these conditions: -cancer -diabetes -heart disease -kidney disease -liver disease -lung disease -prostate disease -an unusual or allergic reaction to testosterone, other medicines, foods, dyes, or preservatives -pregnant or trying to get pregnant -breast-feeding How should I use this medicine? This medicine is for injection into a muscle. It is usually given by a health care professional in a hospital or clinic setting. Contact your pediatrician regarding the use of this medicine in children. While this medicine may be prescribed for children as young as 12 years of age for selected conditions, precautions do apply. Overdosage: If you think you have taken too much of this medicine contact a poison control center or emergency room at once. NOTE: This medicine is only for you. Do not share this medicine with others. What if I miss a dose? Try not to miss a dose. Your doctor or health care professional will tell you when your next injection is due. Notify the office if you are unable to keep an appointment. What may interact with this medicine? -medicines for diabetes -medicines that treat or prevent blood clots like warfarin -oxyphenbutazone -propranolol -steroid medicines like prednisone or cortisone This list may not describe all possible interactions. Give your health care provider a list of all the medicines, herbs, non-prescription drugs, or  dietary supplements you use. Also tell them if you smoke, drink alcohol, or use illegal drugs. Some items may interact with your medicine. What should I watch for while using this medicine? Visit your doctor or health care professional for regular checks on your progress. They will need to check the level of testosterone in your blood. This medicine is only approved for use in men who have low levels of testosterone related to certain medical conditions. Heart attacks and strokes have been reported with the use of this medicine. Notify your doctor or health care professional and seek emergency treatment if you develop breathing problems; changes in vision; confusion; chest pain or chest tightness; sudden arm pain; severe, sudden headache; trouble speaking or understanding; sudden numbness or weakness of the face, arm or leg; loss of balance or coordination. Talk to your doctor about the risks and benefits of this medicine. This medicine may affect blood sugar levels. If you have diabetes, check with your doctor or health care professional before you change your diet or the dose of your diabetic medicine. Testosterone injections are not commonly used in women. Women should inform their doctor if they wish to become pregnant or think they might be pregnant. There is a potential for serious side effects to an unborn child. Talk to your health care professional or pharmacist for more information. Talk with your doctor or health care professional about your birth control options while taking this medicine. This drug is banned from use in athletes by most athletic organizations. What side effects may I notice from receiving this medicine? Side effects that you should report to your doctor or health care professional as soon as possible: -allergic reactions like skin rash,   itching or hives, swelling of the face, lips, or tongue -breast enlargement -breathing problems -changes in emotions or moods -deep or  hoarse voice -irregular menstrual periods -signs and symptoms of liver injury like dark yellow or brown urine; general ill feeling or flu-like symptoms; light-colored stools; loss of appetite; nausea; right upper belly pain; unusually weak or tired; yellowing of the eyes or skin -stomach pain -swelling of the ankles, feet, hands -too frequent or persistent erections -trouble passing urine or change in the amount of urine Side effects that usually do not require medical attention (report to your doctor or health care professional if they continue or are bothersome): -acne -change in sex drive or performance -facial hair growth -hair loss -headache This list may not describe all possible side effects. Call your doctor for medical advice about side effects. You may report side effects to FDA at 1-800-FDA-1088. Where should I keep my medicine? Keep out of the reach of children. This medicine can be abused. Keep your medicine in a safe place to protect it from theft. Do not share this medicine with anyone. Selling or giving away this medicine is dangerous and against the law. Store at room temperature between 20 and 25 degrees C (68 and 77 degrees F). Do not freeze. Protect from light. Follow the directions for the product you are prescribed. Throw away any unused medicine after the expiration date. NOTE: This sheet is a summary. It may not cover all possible information. If you have questions about this medicine, talk to your doctor, pharmacist, or health care provider.  2018 Elsevier/Gold Standard (2015-05-22 07:33:55) Darbepoetin Alfa injection What is this medicine? DARBEPOETIN ALFA (dar be POE e tin AL fa) helps your body make more red blood cells. It is used to treat anemia caused by chronic kidney failure and chemotherapy. This medicine may be used for other purposes; ask your health care provider or pharmacist if you have questions. COMMON BRAND NAME(S): Aranesp What should I tell my  health care provider before I take this medicine? They need to know if you have any of these conditions: -blood clotting disorders or history of blood clots -cancer patient not on chemotherapy -cystic fibrosis -heart disease, such as angina, heart failure, or a history of a heart attack -hemoglobin level of 12 g/dL or greater -high blood pressure -low levels of folate, iron, or vitamin B12 -seizures -an unusual or allergic reaction to darbepoetin, erythropoietin, albumin, hamster proteins, latex, other medicines, foods, dyes, or preservatives -pregnant or trying to get pregnant -breast-feeding How should I use this medicine? This medicine is for injection into a vein or under the skin. It is usually given by a health care professional in a hospital or clinic setting. If you get this medicine at home, you will be taught how to prepare and give this medicine. Use exactly as directed. Take your medicine at regular intervals. Do not take your medicine more often than directed. It is important that you put your used needles and syringes in a special sharps container. Do not put them in a trash can. If you do not have a sharps container, call your pharmacist or healthcare provider to get one. A special MedGuide will be given to you by the pharmacist with each prescription and refill. Be sure to read this information carefully each time. Talk to your pediatrician regarding the use of this medicine in children. While this medicine may be used in children as young as 1 year for selected conditions, precautions do apply. Overdosage:  If you think you have taken too much of this medicine contact a poison control center or emergency room at once. NOTE: This medicine is only for you. Do not share this medicine with others. What if I miss a dose? If you miss a dose, take it as soon as you can. If it is almost time for your next dose, take only that dose. Do not take double or extra doses. What may interact  with this medicine? Do not take this medicine with any of the following medications: -epoetin alfa This list may not describe all possible interactions. Give your health care provider a list of all the medicines, herbs, non-prescription drugs, or dietary supplements you use. Also tell them if you smoke, drink alcohol, or use illegal drugs. Some items may interact with your medicine. What should I watch for while using this medicine? Your condition will be monitored carefully while you are receiving this medicine. You may need blood work done while you are taking this medicine. What side effects may I notice from receiving this medicine? Side effects that you should report to your doctor or health care professional as soon as possible: -allergic reactions like skin rash, itching or hives, swelling of the face, lips, or tongue -breathing problems -changes in vision -chest pain -confusion, trouble speaking or understanding -feeling faint or lightheaded, falls -high blood pressure -muscle aches or pains -pain, swelling, warmth in the leg -rapid weight gain -severe headaches -sudden numbness or weakness of the face, arm or leg -trouble walking, dizziness, loss of balance or coordination -seizures (convulsions) -swelling of the ankles, feet, hands -unusually weak or tired Side effects that usually do not require medical attention (report to your doctor or health care professional if they continue or are bothersome): -diarrhea -fever, chills (flu-like symptoms) -headaches -nausea, vomiting -redness, stinging, or swelling at site where injected This list may not describe all possible side effects. Call your doctor for medical advice about side effects. You may report side effects to FDA at 1-800-FDA-1088. Where should I keep my medicine? Keep out of the reach of children. Store in a refrigerator between 2 and 8 degrees C (36 and 46 degrees F). Do not freeze. Do not shake. Throw away any  unused portion if using a single-dose vial. Throw away any unused medicine after the expiration date. NOTE: This sheet is a summary. It may not cover all possible information. If you have questions about this medicine, talk to your doctor, pharmacist, or health care provider.  2018 Elsevier/Gold Standard (2015-12-06 19:52:26)

## 2017-02-19 NOTE — Progress Notes (Signed)
Hematology and Oncology Follow Up Visit  Larry [name] [id number] 04/02/1941 76 y.o. 02/19/2017   Principle Diagnosis:  1. 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 Chronic renal insufficiency 4. Anemia of renal insufficiency  Current Therapy:   Testosterone 400 mg IM once a month  Aranesp 300 g subcutaneous every 4 weeks for hemoglobin less than 10   Interim History:  Larry [name] is here today with his wife for follow-up. He is doing fairly well and in better spirits. He feels that his energy has mildly improved but he still has some fatigue. He now has a foley catheter and states that he has no bladder elasticity and enlarged prostate. He is followed closely by his nephrologist. So far, he has not needed to start dialysis.  His Hgb is stable at 9.2 with an MCV of 100. He denies having had any episodes of bleeding, bruising or petechiae.  He has had no fever, chills, n/v, cough, rash, dizziness, chest pain, palpitations, abdominal pain or changes in bowel habits.  No lymphadenopathy found on exam.  The puffiness in is ankles comes and goes. This is unchanged and he is taking lasix daily.   He has maintained a good appetite and is staying well hydrated. His weight is stable.   ECOG Performance Status: 2 - Symptomatic, <50% confined to bed  Medications:  Allergies as of 02/19/2017      Reactions   Iohexol Other (See Comments)   Pt states that he was told by MD not to ever have contrast again.     Allopurinol Rash      Medication List       Accurate as of 02/19/17 12:13 PM. Always use your most recent med list.          amLODipine 5 MG tablet Commonly known as:  NORVASC Take 5 mg by mouth daily.   aspirin 81 MG EC tablet Take 1 tablet (81 mg total) by mouth daily.   atorvastatin 10 MG tablet Commonly known as:  LIPITOR Take 10 mg by mouth at bedtime.   carvedilol 3.125 MG tablet Commonly known as:  COREG Take 3.125 mg  by mouth 2 (two) times daily with a meal.   COLCRYS 0.6 MG tablet Generic drug:  colchicine   doxazosin 8 MG tablet Commonly known as:  CARDURA Take 8 mg by mouth.   escitalopram 10 MG tablet Commonly known as:  LEXAPRO Take 1 tablet (10 mg total) by mouth daily.   finasteride 5 MG tablet Commonly known as:  PROSCAR   fluocinonide cream 0.05 % Commonly known as:  LIDEX   furosemide 80 MG tablet Commonly known as:  LASIX Take 1 tablet (80 mg total) by mouth 2 (two) times daily. Pt takes two tablets in the morning and one at bedtime.   JANUVIA 50 MG tablet Generic drug:  sitaGLIPtin   levothyroxine 175 MCG tablet Commonly known as:  SYNTHROID, LEVOTHROID Take 175 mcg by mouth daily before breakfast.   omeprazole 20 MG capsule Commonly known as:  PRILOSEC Take 20 mg by mouth 2 (two) times daily.   tamsulosin 0.4 MG Caps capsule Commonly known as:  FLOMAX   triamcinolone ointment 0.1 % Commonly known as:  KENALOG Apply 1 application topically 2 (two) times daily as needed (for irritation).       Allergies:  Allergies  Allergen Reactions  . Iohexol Other (See Comments)    Pt states that he was told by MD not to  ever have contrast again.    . Allopurinol Rash    Past Medical History, Surgical history, Social history, and Family History were reviewed and updated.  Review of Systems: All other 10 point review of systems is negative.   Physical Exam:  weight is 164 lb (74.4 kg). His oral temperature is 97.6 F (36.4 C). His blood pressure is 155/67 (abnormal) and his pulse is 65. His respiration is 16 and oxygen saturation is 98%.   Wt Readings from Last 3 Encounters:  02/19/17 164 lb (74.4 kg)  01/19/17 163 lb (73.9 kg)  12/08/16 168 lb (76.2 kg)    Ocular: Sclerae unicteric, pupils equal, round and reactive to light Ear-nose-throat: Oropharynx clear, dentition fair Lymphatic: No cervical, supraclavicular or axillary adenopathy Lungs no rales or rhonchi,  good excursion bilaterally Heart regular rate and rhythm, no murmur appreciated Abd soft, nontender, positive bowel sounds, no liver tip palpated on exam, no fluid wave MSK no focal spinal tenderness, no joint edema Neuro: non-focal, well-oriented, appropriate affect Breasts: Deferred   Lab Results  Component Value Date   WBC 9.4 02/19/2017   HGB 9.2 (L) 02/19/2017   HCT 27.6 (L) 02/19/2017   MCV 100 (H) 02/19/2017   PLT 299 02/19/2017   Lab Results  Component Value Date   FERRITIN 402 (H) 01/19/2017   IRON 71 01/19/2017   TIBC 170 (L) 01/19/2017   UIBC 99 (L) 01/19/2017   IRONPCTSAT 42 01/19/2017   Lab Results  Component Value Date   RETICCTPCT 0.9 03/29/2011   RBC 2.76 (L) 02/19/2017   RETICCTABS 31.8 03/29/2011   No results found for: KPAFRELGTCHN, LAMBDASER, KAPLAMBRATIO No results found for: Kandis Cocking, [facility] Lab Results  Component Value Date   TOTALPROTELP 6.7 07/14/2011   ALBUMINELP 56.6 07/14/2011   A1GS 5.7 (H) 07/14/2011   A2GS 12.7 (H) 07/14/2011   BETS 4.9 07/14/2011   BETA2SER 3.8 07/14/2011   GAMS 16.3 07/14/2011   MSPIKE NOT DET 07/14/2011   SPEI * 07/14/2011     Chemistry      Component Value Date/Time   NA 144 02/19/2017 1121   NA 142 02/15/2016 0953   K 4.8 (H) 02/19/2017 1121   K 4.4 02/15/2016 0953   CL 106 02/19/2017 1121   CO2 29 02/19/2017 1121   CO2 23 02/15/2016 0953   BUN 45 (H) 02/19/2017 1121   BUN 47.4 (H) 02/15/2016 0953   CREATININE 2.5 (H) 02/19/2017 1121   CREATININE 2.6 (H) 02/15/2016 0953      Component Value Date/Time   CALCIUM 9.3 02/19/2017 1121   CALCIUM 9.1 02/15/2016 0953   ALKPHOS 101 (H) 02/19/2017 1121   ALKPHOS 99 02/15/2016 0953   AST 21 02/19/2017 1121   AST 16 02/15/2016 0953   ALT 16 02/19/2017 1121   ALT 7 02/15/2016 0953   BILITOT 0.60 02/19/2017 1121   BILITOT 0.93 02/15/2016 0953      Impression and Plan: Larry [name] is a pleasant 76 yo caucasian gentleman with anemia of chronic renal  failure and hypotestosteronemia. He also has history of splenic marginal zone lymphoma with splenectomy in 2009. He was able to complete one cycle of Rituxan in April 2010. He seems to be doing a little better and is in much better spirits this visit.  We will proceed with his testosterone injection today as well as Aranesp for Hgb of 9.2.  We will plan to see him back in another month for repeat lab work and follow-up.  They are  in agreement with the plan and will contact our office with any questions or concerns. We can certainly see her sooner if need be. They will go to the ED in the event of an emergency.  We can certainly see him sooner if need be.   Eliezer Bottom, NP 10/22/201812:13 PM

## 2017-02-20 LAB — RETICULOCYTES: Reticulocyte Count: 0.9 % (ref 0.6–2.6)

## 2017-02-20 LAB — TESTOSTERONE: Testosterone, Serum: 186 ng/dL — ABNORMAL LOW (ref 264–916)

## 2017-02-27 DIAGNOSIS — N134 Hydroureter: Secondary | ICD-10-CM | POA: Diagnosis not present

## 2017-02-27 DIAGNOSIS — R338 Other retention of urine: Secondary | ICD-10-CM | POA: Diagnosis not present

## 2017-03-07 MED FILL — ESCITALOPRAM 10 MG TABLET: 10 | 90 days supply | Qty: 90 | Fill #1

## 2017-03-07 MED FILL — JANUVIA 50 MG TABLET: 50 | 30 days supply | Qty: 30 | Fill #2

## 2017-03-20 DIAGNOSIS — I779 Disorder of arteries and arterioles, unspecified: Secondary | ICD-10-CM | POA: Diagnosis not present

## 2017-03-20 DIAGNOSIS — N2581 Secondary hyperparathyroidism of renal origin: Secondary | ICD-10-CM | POA: Diagnosis not present

## 2017-03-20 DIAGNOSIS — I5032 Chronic diastolic (congestive) heart failure: Secondary | ICD-10-CM | POA: Diagnosis not present

## 2017-03-20 DIAGNOSIS — I359 Nonrheumatic aortic valve disorder, unspecified: Secondary | ICD-10-CM | POA: Diagnosis not present

## 2017-03-20 DIAGNOSIS — D631 Anemia in chronic kidney disease: Secondary | ICD-10-CM | POA: Diagnosis not present

## 2017-03-20 DIAGNOSIS — C8307 Small cell B-cell lymphoma, spleen: Secondary | ICD-10-CM | POA: Diagnosis not present

## 2017-03-20 DIAGNOSIS — I10 Essential (primary) hypertension: Secondary | ICD-10-CM | POA: Diagnosis not present

## 2017-03-20 DIAGNOSIS — M1A09X0 Idiopathic chronic gout, multiple sites, without tophus (tophi): Secondary | ICD-10-CM | POA: Diagnosis not present

## 2017-03-20 DIAGNOSIS — E039 Hypothyroidism, unspecified: Secondary | ICD-10-CM | POA: Diagnosis not present

## 2017-03-20 DIAGNOSIS — N185 Chronic kidney disease, stage 5: Secondary | ICD-10-CM | POA: Diagnosis not present

## 2017-03-20 DIAGNOSIS — N1330 Unspecified hydronephrosis: Secondary | ICD-10-CM | POA: Diagnosis not present

## 2017-03-21 ENCOUNTER — Other Ambulatory Visit (HOSPITAL_BASED_OUTPATIENT_CLINIC_OR_DEPARTMENT_OTHER): Payer: Medicare Other

## 2017-03-21 ENCOUNTER — Ambulatory Visit (HOSPITAL_BASED_OUTPATIENT_CLINIC_OR_DEPARTMENT_OTHER): Payer: Medicare Other | Admitting: Family

## 2017-03-21 ENCOUNTER — Ambulatory Visit (HOSPITAL_BASED_OUTPATIENT_CLINIC_OR_DEPARTMENT_OTHER): Payer: Medicare Other

## 2017-03-21 ENCOUNTER — Other Ambulatory Visit: Payer: Self-pay

## 2017-03-21 VITALS — BP 141/67 | HR 72 | Temp 97.6°F | Resp 18 | Wt 171.5 lb

## 2017-03-21 DIAGNOSIS — Z8572 Personal history of non-Hodgkin lymphomas: Secondary | ICD-10-CM | POA: Diagnosis not present

## 2017-03-21 DIAGNOSIS — D631 Anemia in chronic kidney disease: Secondary | ICD-10-CM

## 2017-03-21 DIAGNOSIS — E291 Testicular hypofunction: Secondary | ICD-10-CM

## 2017-03-21 DIAGNOSIS — E349 Endocrine disorder, unspecified: Secondary | ICD-10-CM

## 2017-03-21 DIAGNOSIS — C8307 Small cell B-cell lymphoma, spleen: Secondary | ICD-10-CM

## 2017-03-21 DIAGNOSIS — D509 Iron deficiency anemia, unspecified: Secondary | ICD-10-CM

## 2017-03-21 DIAGNOSIS — N189 Chronic kidney disease, unspecified: Secondary | ICD-10-CM | POA: Diagnosis not present

## 2017-03-21 DIAGNOSIS — N184 Chronic kidney disease, stage 4 (severe): Secondary | ICD-10-CM | POA: Diagnosis not present

## 2017-03-21 LAB — CBC WITH DIFFERENTIAL (CANCER CENTER ONLY)
BASO#: 0 10*3/uL (ref 0.0–0.2)
BASO%: 0.5 % (ref 0.0–2.0)
EOS ABS: 1.2 10*3/uL — AB (ref 0.0–0.5)
EOS%: 14.5 % — AB (ref 0.0–7.0)
HEMATOCRIT: 30.6 % — AB (ref 38.7–49.9)
HEMOGLOBIN: 10 g/dL — AB (ref 13.0–17.1)
LYMPH#: 1.3 10*3/uL (ref 0.9–3.3)
LYMPH%: 15.1 % (ref 14.0–48.0)
MCH: 33 pg (ref 28.0–33.4)
MCHC: 32.7 g/dL (ref 32.0–35.9)
MCV: 101 fL — ABNORMAL HIGH (ref 82–98)
MONO#: 1.1 10*3/uL — ABNORMAL HIGH (ref 0.1–0.9)
MONO%: 13 % (ref 0.0–13.0)
NEUT%: 56.9 % (ref 40.0–80.0)
NEUTROS ABS: 4.9 10*3/uL (ref 1.5–6.5)
Platelets: 240 10*3/uL (ref 145–400)
RBC: 3.03 10*6/uL — ABNORMAL LOW (ref 4.20–5.70)
RDW: 15.3 % (ref 11.1–15.7)
WBC: 8.6 10*3/uL (ref 4.0–10.0)

## 2017-03-21 LAB — CMP (CANCER CENTER ONLY)
ALK PHOS: 109 U/L — AB (ref 26–84)
ALT: 24 U/L (ref 10–47)
AST: 19 U/L (ref 11–38)
Albumin: 2.9 g/dL — ABNORMAL LOW (ref 3.3–5.5)
BILIRUBIN TOTAL: 0.7 mg/dL (ref 0.20–1.60)
BUN, Bld: 50 mg/dL — ABNORMAL HIGH (ref 7–22)
CO2: 27 meq/L (ref 18–33)
CREATININE: 2.7 mg/dL — AB (ref 0.6–1.2)
Calcium: 8.8 mg/dL (ref 8.0–10.3)
Chloride: 108 mEq/L (ref 98–108)
GLUCOSE: 132 mg/dL — AB (ref 73–118)
Potassium: 4.6 mEq/L (ref 3.3–4.7)
SODIUM: 146 meq/L — AB (ref 128–145)
Total Protein: 6.7 g/dL (ref 6.4–8.1)

## 2017-03-21 MED ORDER — TESTOSTERONE CYPIONATE 200 MG/ML IM SOLN
400.0000 mg | INTRAMUSCULAR | Status: DC
  Administered 2017-03-21: 400 mg via INTRAMUSCULAR

## 2017-03-21 MED ORDER — TESTOSTERONE CYPIONATE 200 MG/ML IM SOLN
INTRAMUSCULAR | Status: AC
  Filled 2017-03-21: qty 2

## 2017-03-21 NOTE — Progress Notes (Signed)
Hematology and Oncology Follow Up Visit  Larry [name] [id number] January 10, 1941 76 y.o. 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 He is feeling better but still has some fatigue at times. Hgb is stable at 10.0, MCV 101 and platelet count 240.  Testosterone in October was 186.  No episodes of bleeding, bruising or petechiae. No lymphadenopathy found on exam.  No fever, chills, n/v, cough, rash, dizziness, SOB, chest pain, palpitations, abdominal pain or changes in bowel or bladder habits.  He still has a foley catheter in place and states that this is working appropriately.  No tenderness, numbness or tingling in her extremities. The swelling in his feet and ankles is unchanged. He continues to take his lasix daily as prescribed. No c/o pain.   She has maintained a good appetite and is staying well hydrated.   ECOG Performance Status: 2 - Symptomatic, <50% confined to bed  Medications:  Allergies as of 03/21/2017      Reactions   Iohexol Other (See Comments)   Pt states that he was told by MD not to ever have contrast again.     Allopurinol Rash      Medication List        Accurate as of 03/21/17  1:21 PM. Always use your most recent med list.          amLODipine 5 MG tablet Commonly known as:  NORVASC Take 5 mg by mouth daily.   aspirin 81 MG EC tablet Take 1 tablet (81 mg total) by mouth daily.   atorvastatin 10 MG tablet Commonly known as:  LIPITOR Take 10 mg by mouth at bedtime.   carvedilol 3.125 MG tablet Commonly known as:  COREG Take 3.125 mg by mouth 2 (two) times daily with a meal.   COLCRYS 0.6 MG tablet Generic drug:   colchicine   doxazosin 8 MG tablet Commonly known as:  CARDURA Take 8 mg by mouth.   escitalopram 10 MG tablet Commonly known as:  LEXAPRO Take 1 tablet (10 mg total) by mouth daily.   finasteride 5 MG tablet Commonly known as:  PROSCAR   fluocinonide cream 0.05 % Commonly known as:  LIDEX   furosemide 80 MG tablet Commonly known as:  LASIX Take 1 tablet (80 mg total) by mouth 2 (two) times daily. Pt takes two tablets in the morning and one at bedtime.   JANUVIA 50 MG tablet Generic drug:  sitaGLIPtin   levothyroxine 175 MCG tablet Commonly known as:  SYNTHROID, LEVOTHROID Take 175 mcg by mouth daily before breakfast.   omeprazole 20 MG capsule Commonly known as:  PRILOSEC Take 20 mg by mouth 2 (two) times daily.   tamsulosin 0.4 MG Caps capsule Commonly known as:  FLOMAX   triamcinolone ointment 0.1 % Commonly known as:  KENALOG Apply 1 application topically 2 (two) times daily as needed (for irritation).       Allergies:  Allergies  Allergen Reactions  . Iohexol Other (See Comments)    Pt states that he was told by MD not to ever have contrast again.    . Allopurinol Rash    Past Medical History, Surgical history, Social history, and  Family History were reviewed and updated.  Review of Systems: All other 10 point review of systems is negative.   Physical Exam:  weight is 171 lb 8 oz (77.8 kg). His oral temperature is 97.6 F (36.4 C). His blood pressure is 141/67 (abnormal) and his pulse is 72. His respiration is 18 and oxygen saturation is 99%.   Wt Readings from Last 3 Encounters:  03/21/17 171 lb 8 oz (77.8 kg)  02/19/17 164 lb (74.4 kg)  01/19/17 163 lb (73.9 kg)    Ocular: Sclerae unicteric, pupils equal, round and reactive to light Ear-nose-throat: Oropharynx clear, dentition fair Lymphatic: No cervical, supraclavicular or axillary adenopathy Lungs no rales or rhonchi, good excursion bilaterally Heart regular rate and rhythm, no murmur  appreciated Abd soft, nontender, positive bowel sounds, no liver tip palpated on exam, no fluid wave MSK no focal spinal tenderness, no joint edema Neuro: non-focal, well-oriented, appropriate affect Breasts: Deferred   Lab Results  Component Value Date   WBC 8.6 03/21/2017   HGB 10.0 (L) 03/21/2017   HCT 30.6 (L) 03/21/2017   MCV 101 (H) 03/21/2017   PLT 240 03/21/2017   Lab Results  Component Value Date   FERRITIN 393 (H) 02/19/2017   IRON 44 02/19/2017   TIBC 169 (L) 02/19/2017   UIBC 124 02/19/2017   IRONPCTSAT 26 02/19/2017   Lab Results  Component Value Date   RETICCTPCT 0.9 03/29/2011   RBC 3.03 (L) 03/21/2017   RETICCTABS 31.8 03/29/2011   No results found for: KPAFRELGTCHN, LAMBDASER, KAPLAMBRATIO No results found for: Kandis Cocking, [facility] Lab Results  Component Value Date   TOTALPROTELP 6.7 07/14/2011   ALBUMINELP 56.6 07/14/2011   A1GS 5.7 (H) 07/14/2011   A2GS 12.7 (H) 07/14/2011   BETS 4.9 07/14/2011   BETA2SER 3.8 07/14/2011   GAMS 16.3 07/14/2011   MSPIKE NOT DET 07/14/2011   SPEI * 07/14/2011     Chemistry      Component Value Date/Time   NA 146 (H) 03/21/2017 1254   NA 142 02/15/2016 0953   K 4.6 03/21/2017 1254   K 4.4 02/15/2016 0953   CL 108 03/21/2017 1254   CO2 27 03/21/2017 1254   CO2 23 02/15/2016 0953   BUN 50 (H) 03/21/2017 1254   BUN 47.4 (H) 02/15/2016 0953   CREATININE 2.7 (H) 03/21/2017 1254   CREATININE 2.6 (H) 02/15/2016 0953      Component Value Date/Time   CALCIUM 8.8 03/21/2017 1254   CALCIUM 9.1 02/15/2016 0953   ALKPHOS 109 (H) 03/21/2017 1254   ALKPHOS 99 02/15/2016 0953   AST 19 03/21/2017 1254   AST 16 02/15/2016 0953   ALT 24 03/21/2017 1254   ALT 7 02/15/2016 0953   BILITOT 0.70 03/21/2017 1254   BILITOT 0.93 02/15/2016 0953      Impression and Plan: Larry [name] is a very pleasant 76 yo caucasian gentleman with anemia of chronic renal failure and hypotestosteronemia. He also has history of splenic  marginal zone lymphoma s/p splenectomy 2009. He completed one cycle of Rituxan in April 2010. He continues to do fairly well and has no complaints at this time other than mild fatigue. Hgb is 10 so we will hold the Aranesp today. We will proceed with the testosterone injection today as planned.   We will plan to see him back again in another month for repeat lab work and follow-up.  Both he and his wife know to contact our office with any questions or concerns. We can  certainly see her sooner if need be.  He will go to the ED in the event of an emergency.   Eliezer Bottom, NP 11/21/20181:21 PM

## 2017-03-21 NOTE — Patient Instructions (Signed)
Testosterone injection What is this medicine? TESTOSTERONE (tes TOS ter one) is the main male hormone. It supports normal male development such as muscle growth, facial hair, and deep voice. It is used in males to treat low testosterone levels. This medicine may be used for other purposes; ask your health care provider or pharmacist if you have questions. COMMON BRAND NAME(S): Andro-L.A., Aveed, Delatestryl, Depo-Testosterone, Virilon What should I tell my health care provider before I take this medicine? They need to know if you have any of these conditions: -cancer -diabetes -heart disease -kidney disease -liver disease -lung disease -prostate disease -an unusual or allergic reaction to testosterone, other medicines, foods, dyes, or preservatives -pregnant or trying to get pregnant -breast-feeding How should I use this medicine? This medicine is for injection into a muscle. It is usually given by a health care professional in a hospital or clinic setting. Contact your pediatrician regarding the use of this medicine in children. While this medicine may be prescribed for children as young as 12 years of age for selected conditions, precautions do apply. Overdosage: If you think you have taken too much of this medicine contact a poison control center or emergency room at once. NOTE: This medicine is only for you. Do not share this medicine with others. What if I miss a dose? Try not to miss a dose. Your doctor or health care professional will tell you when your next injection is due. Notify the office if you are unable to keep an appointment. What may interact with this medicine? -medicines for diabetes -medicines that treat or prevent blood clots like warfarin -oxyphenbutazone -propranolol -steroid medicines like prednisone or cortisone This list may not describe all possible interactions. Give your health care provider a list of all the medicines, herbs, non-prescription drugs, or  dietary supplements you use. Also tell them if you smoke, drink alcohol, or use illegal drugs. Some items may interact with your medicine. What should I watch for while using this medicine? Visit your doctor or health care professional for regular checks on your progress. They will need to check the level of testosterone in your blood. This medicine is only approved for use in men who have low levels of testosterone related to certain medical conditions. Heart attacks and strokes have been reported with the use of this medicine. Notify your doctor or health care professional and seek emergency treatment if you develop breathing problems; changes in vision; confusion; chest pain or chest tightness; sudden arm pain; severe, sudden headache; trouble speaking or understanding; sudden numbness or weakness of the face, arm or leg; loss of balance or coordination. Talk to your doctor about the risks and benefits of this medicine. This medicine may affect blood sugar levels. If you have diabetes, check with your doctor or health care professional before you change your diet or the dose of your diabetic medicine. Testosterone injections are not commonly used in women. Women should inform their doctor if they wish to become pregnant or think they might be pregnant. There is a potential for serious side effects to an unborn child. Talk to your health care professional or pharmacist for more information. Talk with your doctor or health care professional about your birth control options while taking this medicine. This drug is banned from use in athletes by most athletic organizations. What side effects may I notice from receiving this medicine? Side effects that you should report to your doctor or health care professional as soon as possible: -allergic reactions like skin rash,   itching or hives, swelling of the face, lips, or tongue -breast enlargement -breathing problems -changes in emotions or moods -deep or  hoarse voice -irregular menstrual periods -signs and symptoms of liver injury like dark yellow or brown urine; general ill feeling or flu-like symptoms; light-colored stools; loss of appetite; nausea; right upper belly pain; unusually weak or tired; yellowing of the eyes or skin -stomach pain -swelling of the ankles, feet, hands -too frequent or persistent erections -trouble passing urine or change in the amount of urine Side effects that usually do not require medical attention (report to your doctor or health care professional if they continue or are bothersome): -acne -change in sex drive or performance -facial hair growth -hair loss -headache This list may not describe all possible side effects. Call your doctor for medical advice about side effects. You may report side effects to FDA at 1-800-FDA-1088. Where should I keep my medicine? Keep out of the reach of children. This medicine can be abused. Keep your medicine in a safe place to protect it from theft. Do not share this medicine with anyone. Selling or giving away this medicine is dangerous and against the law. Store at room temperature between 20 and 25 degrees C (68 and 77 degrees F). Do not freeze. Protect from light. Follow the directions for the product you are prescribed. Throw away any unused medicine after the expiration date. NOTE: This sheet is a summary. It may not cover all possible information. If you have questions about this medicine, talk to your doctor, pharmacist, or health care provider.  2018 Elsevier/Gold Standard (2015-05-22 07:33:55)  

## 2017-03-22 LAB — TESTOSTERONE: Testosterone, Serum: 327 ng/dL (ref 264–916)

## 2017-03-22 LAB — RETICULOCYTES: RETICULOCYTE COUNT: 0.6 % (ref 0.6–2.6)

## 2017-03-23 LAB — IRON AND TIBC
%SAT: 50 % (ref 20–55)
Iron: 84 ug/dL (ref 42–163)
TIBC: 169 ug/dL — ABNORMAL LOW (ref 202–409)
UIBC: 85 ug/dL — ABNORMAL LOW (ref 117–376)

## 2017-03-23 LAB — FERRITIN: FERRITIN: 279 ng/mL (ref 22–316)

## 2017-03-26 DIAGNOSIS — R338 Other retention of urine: Secondary | ICD-10-CM | POA: Diagnosis not present

## 2017-04-17 DIAGNOSIS — M1A09X0 Idiopathic chronic gout, multiple sites, without tophus (tophi): Secondary | ICD-10-CM | POA: Diagnosis not present

## 2017-04-20 ENCOUNTER — Encounter: Payer: Self-pay | Admitting: Hematology & Oncology

## 2017-04-20 ENCOUNTER — Ambulatory Visit (HOSPITAL_BASED_OUTPATIENT_CLINIC_OR_DEPARTMENT_OTHER): Payer: Medicare Other

## 2017-04-20 ENCOUNTER — Other Ambulatory Visit (HOSPITAL_BASED_OUTPATIENT_CLINIC_OR_DEPARTMENT_OTHER): Payer: Medicare Other

## 2017-04-20 ENCOUNTER — Ambulatory Visit (HOSPITAL_BASED_OUTPATIENT_CLINIC_OR_DEPARTMENT_OTHER): Payer: Medicare Other | Admitting: Hematology & Oncology

## 2017-04-20 ENCOUNTER — Other Ambulatory Visit: Payer: Self-pay

## 2017-04-20 VITALS — BP 157/78 | HR 65 | Temp 97.5°F | Resp 19 | Wt 171.8 lb

## 2017-04-20 DIAGNOSIS — N189 Chronic kidney disease, unspecified: Secondary | ICD-10-CM

## 2017-04-20 DIAGNOSIS — N184 Chronic kidney disease, stage 4 (severe): Secondary | ICD-10-CM

## 2017-04-20 DIAGNOSIS — E349 Endocrine disorder, unspecified: Secondary | ICD-10-CM

## 2017-04-20 DIAGNOSIS — Z8572 Personal history of non-Hodgkin lymphomas: Secondary | ICD-10-CM

## 2017-04-20 DIAGNOSIS — C8307 Small cell B-cell lymphoma, spleen: Secondary | ICD-10-CM | POA: Diagnosis not present

## 2017-04-20 DIAGNOSIS — D631 Anemia in chronic kidney disease: Secondary | ICD-10-CM

## 2017-04-20 DIAGNOSIS — E291 Testicular hypofunction: Secondary | ICD-10-CM

## 2017-04-20 DIAGNOSIS — N171 Acute kidney failure with acute cortical necrosis: Secondary | ICD-10-CM

## 2017-04-20 LAB — CBC WITH DIFFERENTIAL (CANCER CENTER ONLY)
BASO#: 0 10*3/uL (ref 0.0–0.2)
BASO%: 0.3 % (ref 0.0–2.0)
EOS ABS: 0.9 10*3/uL — AB (ref 0.0–0.5)
EOS%: 12.5 % — ABNORMAL HIGH (ref 0.0–7.0)
HCT: 28.4 % — ABNORMAL LOW (ref 38.7–49.9)
HGB: 9.3 g/dL — ABNORMAL LOW (ref 13.0–17.1)
LYMPH#: 1.2 10*3/uL (ref 0.9–3.3)
LYMPH%: 16.3 % (ref 14.0–48.0)
MCH: 32.5 pg (ref 28.0–33.4)
MCHC: 32.7 g/dL (ref 32.0–35.9)
MCV: 99 fL — ABNORMAL HIGH (ref 82–98)
MONO#: 0.8 10*3/uL (ref 0.1–0.9)
MONO%: 10.6 % (ref 0.0–13.0)
NEUT#: 4.5 10*3/uL (ref 1.5–6.5)
NEUT%: 60.3 % (ref 40.0–80.0)
PLATELETS: 191 10*3/uL (ref 145–400)
RBC: 2.86 10*6/uL — ABNORMAL LOW (ref 4.20–5.70)
RDW: 14.9 % (ref 11.1–15.7)
WBC: 7.4 10*3/uL (ref 4.0–10.0)

## 2017-04-20 LAB — CMP (CANCER CENTER ONLY)
ALT(SGPT): 16 U/L (ref 10–47)
AST: 16 U/L (ref 11–38)
Albumin: 2.8 g/dL — ABNORMAL LOW (ref 3.3–5.5)
Alkaline Phosphatase: 103 U/L — ABNORMAL HIGH (ref 26–84)
BUN: 43 mg/dL — AB (ref 7–22)
CHLORIDE: 106 meq/L (ref 98–108)
CO2: 26 meq/L (ref 18–33)
CREATININE: 3 mg/dL — AB (ref 0.6–1.2)
Calcium: 8.8 mg/dL (ref 8.0–10.3)
Glucose, Bld: 167 mg/dL — ABNORMAL HIGH (ref 73–118)
Potassium: 4.6 mEq/L (ref 3.3–4.7)
SODIUM: 140 meq/L (ref 128–145)
TOTAL PROTEIN: 6.4 g/dL (ref 6.4–8.1)
Total Bilirubin: 0.6 mg/dl (ref 0.20–1.60)

## 2017-04-20 MED ORDER — DARBEPOETIN ALFA 300 MCG/0.6ML IJ SOSY
300.0000 ug | PREFILLED_SYRINGE | Freq: Once | INTRAMUSCULAR | Status: AC
  Administered 2017-04-20: 300 ug via SUBCUTANEOUS

## 2017-04-20 MED ORDER — TESTOSTERONE CYPIONATE 200 MG/ML IM SOLN
INTRAMUSCULAR | Status: AC
  Filled 2017-04-20: qty 2

## 2017-04-20 MED ORDER — TESTOSTERONE CYPIONATE 200 MG/ML IM SOLN
400.0000 mg | INTRAMUSCULAR | Status: DC
Start: 2017-04-20 — End: 2017-04-20
  Administered 2017-04-20: 400 mg via INTRAMUSCULAR

## 2017-04-20 MED ORDER — DARBEPOETIN ALFA 300 MCG/0.6ML IJ SOSY
PREFILLED_SYRINGE | INTRAMUSCULAR | Status: AC
  Filled 2017-04-20: qty 0.6

## 2017-04-20 NOTE — Progress Notes (Signed)
Hematology and Oncology Follow Up Visit  Larry [name] [id number] 1941-01-17 76 y.o. 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 is here today with his wife for follow-up. He is feeling better but still has some fatigue at times. Hgb has dropped a little bit.  The hemoglobin is now 9.6.  As such, he will get a dose of Aranesp.   His testosterone level in November was 327.  His iron studies in November showed a ferritin of 279 with an iron saturation of 50%.  No episodes of bleeding, bruising or petechiae.  No fever, chills, n/v, cough, rash, dizziness, SOB, chest pain, palpitations, abdominal pain or changes in bowel or bladder habits.  He still has a chronic indwelling Foley catheter in place and states that this is working appropriately.   No tenderness, numbness or tingling in her extremities. The swelling in his feet and ankles is unchanged    He has maintained a good appetite and is staying well hydrated.   ECOG Performance Status: 2 - Symptomatic, <50% confined to bed  Medications:  Allergies as of 04/20/2017      Reactions   Iohexol Other (See Comments)   Pt states that he was told by MD not to ever have contrast again.     Allopurinol Rash      Medication List        Accurate as of 04/20/17  2:36 PM. Always use your most recent med list.          amLODipine 5 MG tablet Commonly known as:  NORVASC Take 5 mg by mouth daily.   aspirin 81 MG EC tablet Take 1 tablet (81 mg total) by mouth daily.   atorvastatin 10 MG tablet Commonly known as:  LIPITOR Take 10 mg by mouth at bedtime.   carvedilol 3.125 MG tablet Commonly known as:  COREG Take 3.125 mg by mouth 2 (two)  times daily with a meal.   COLCRYS 0.6 MG tablet Generic drug:  colchicine   doxazosin 8 MG tablet Commonly known as:  CARDURA Take 8 mg by mouth.   escitalopram 10 MG tablet Commonly known as:  LEXAPRO Take 1 tablet (10 mg total) by mouth daily.   finasteride 5 MG tablet Commonly known as:  PROSCAR   fluocinonide cream 0.05 % Commonly known as:  LIDEX   furosemide 80 MG tablet Commonly known as:  LASIX Take 1 tablet (80 mg total) by mouth 2 (two) times daily. Pt takes two tablets in the morning and one at bedtime.   JANUVIA 50 MG tablet Generic drug:  sitaGLIPtin   levothyroxine 175 MCG tablet Commonly known as:  SYNTHROID, LEVOTHROID Take 175 mcg by mouth daily before breakfast.   omeprazole 20 MG capsule Commonly known as:  PRILOSEC Take 20 mg by mouth 2 (two) times daily.   tamsulosin 0.4 MG Caps capsule Commonly known as:  FLOMAX   triamcinolone ointment 0.1 % Commonly known as:  KENALOG Apply 1 application topically 2 (two) times daily as needed (for irritation).       Allergies:  Allergies  Allergen Reactions  . Iohexol Other (See Comments)    Pt states that he was told by MD not to ever have contrast  again.    . Allopurinol Rash    Past Medical History, Surgical history, Social history, and Family History were reviewed and updated.  Review of Systems: Review of Systems  All other systems reviewed and are negative. Marland Kitchen   Physical Exam:  weight is 171 lb 12.8 oz (77.9 kg). His oral temperature is 97.5 F (36.4 C) (abnormal). His blood pressure is 157/78 (abnormal) and his pulse is 65. His respiration is 19 and oxygen saturation is 99%.   Wt Readings from Last 3 Encounters:  04/20/17 171 lb 12.8 oz (77.9 kg)  03/21/17 171 lb 8 oz (77.8 kg)  02/19/17 164 lb (74.4 kg)    Physical Exam  Constitutional: He is oriented to person, place, and time.  HENT:  Head: Normocephalic and atraumatic.  Mouth/Throat: Oropharynx is clear and moist.  Eyes: EOM  are normal. Pupils are equal, round, and reactive to light.  Neck: Normal range of motion.  Cardiovascular: Normal rate, regular rhythm and normal heart sounds.  Pulmonary/Chest: Effort normal and breath sounds normal.  Abdominal: Soft. Bowel sounds are normal.  Musculoskeletal: Normal range of motion. He exhibits no edema, tenderness or deformity.  Lymphadenopathy:    He has no cervical adenopathy.  Neurological: He is alert and oriented to person, place, and time.  Skin: Skin is warm and dry. No rash noted. No erythema.  Psychiatric: He has a normal mood and affect. His behavior is normal. Judgment and thought content normal.  Vitals reviewed.    Lab Results  Component Value Date   WBC 7.4 04/20/2017   HGB 9.3 (L) 04/20/2017   HCT 28.4 (L) 04/20/2017   MCV 99 (H) 04/20/2017   PLT 191 04/20/2017   Lab Results  Component Value Date   FERRITIN 279 03/21/2017   IRON 84 03/21/2017   TIBC 169 (L) 03/21/2017   UIBC 85 (L) 03/21/2017   IRONPCTSAT 50 03/21/2017   Lab Results  Component Value Date   RETICCTPCT 0.9 03/29/2011   RBC 2.86 (L) 04/20/2017   RETICCTABS 31.8 03/29/2011   No results found for: KPAFRELGTCHN, LAMBDASER, KAPLAMBRATIO No results found for: Kandis Cocking, [facility] Lab Results  Component Value Date   TOTALPROTELP 6.7 07/14/2011   ALBUMINELP 56.6 07/14/2011   A1GS 5.7 (H) 07/14/2011   A2GS 12.7 (H) 07/14/2011   BETS 4.9 07/14/2011   BETA2SER 3.8 07/14/2011   GAMS 16.3 07/14/2011   MSPIKE NOT DET 07/14/2011   SPEI * 07/14/2011     Chemistry      Component Value Date/Time   NA 140 04/20/2017 1259   NA 142 02/15/2016 0953   K 4.6 04/20/2017 1259   K 4.4 02/15/2016 0953   CL 106 04/20/2017 1259   CO2 26 04/20/2017 1259   CO2 23 02/15/2016 0953   BUN 43 (H) 04/20/2017 1259   BUN 47.4 (H) 02/15/2016 0953   CREATININE 3.0 (H) 04/20/2017 1259   CREATININE 2.6 (H) 02/15/2016 0953      Component Value Date/Time   CALCIUM 8.8 04/20/2017 1259    CALCIUM 9.1 02/15/2016 0953   ALKPHOS 103 (H) 04/20/2017 1259   ALKPHOS 99 02/15/2016 0953   AST 16 04/20/2017 1259   AST 16 02/15/2016 0953   ALT 16 04/20/2017 1259   ALT 7 02/15/2016 0953   BILITOT 0.60 04/20/2017 1259   BILITOT 0.93 02/15/2016 0953      Impression and Plan: Mr. Chew is a very pleasant 76 yo caucasian gentleman with anemia of chronic renal failure and hypotestosteronemia. He  also has history of splenic marginal zone lymphoma s/p splenectomy 2009.   He completed one cycle of Rituxan in April 2010.   He continues to do fairly well and has no complaints at this time other than mild fatigue.  Hgb is < 10,  so we will give the Aranesp today. We will proceed with the testosterone injection today as planned.    We will plan to see him back again in another month for repeat lab work and follow-up.   Both he and his wife know to contact our office with any questions or concerns. We can certainly see her sooner if need be.   He will go to the ED in the event of an emergency.   Volanda Napoleon, MD 12/21/20182:36 PM

## 2017-04-21 LAB — RETICULOCYTES: Reticulocyte Count: 0.7 % (ref 0.6–2.6)

## 2017-04-21 LAB — TESTOSTERONE: TESTOSTERONE: 249 ng/dL — AB (ref 264–916)

## 2017-04-23 LAB — IRON AND TIBC
%SAT: 41 % (ref 20–55)
Iron: 71 ug/dL (ref 42–163)
TIBC: 171 ug/dL — ABNORMAL LOW (ref 202–409)
UIBC: 100 ug/dL — ABNORMAL LOW (ref 117–376)

## 2017-04-23 LAB — FERRITIN: FERRITIN: 242 ng/mL (ref 22–316)

## 2017-04-25 DIAGNOSIS — R338 Other retention of urine: Secondary | ICD-10-CM | POA: Diagnosis not present

## 2017-05-10 DIAGNOSIS — M1A09X0 Idiopathic chronic gout, multiple sites, without tophus (tophi): Secondary | ICD-10-CM | POA: Diagnosis not present

## 2017-05-10 DIAGNOSIS — I10 Essential (primary) hypertension: Secondary | ICD-10-CM | POA: Diagnosis not present

## 2017-05-10 DIAGNOSIS — I779 Disorder of arteries and arterioles, unspecified: Secondary | ICD-10-CM | POA: Diagnosis not present

## 2017-05-10 DIAGNOSIS — K219 Gastro-esophageal reflux disease without esophagitis: Secondary | ICD-10-CM | POA: Diagnosis not present

## 2017-05-10 DIAGNOSIS — C8307 Small cell B-cell lymphoma, spleen: Secondary | ICD-10-CM | POA: Diagnosis not present

## 2017-05-10 DIAGNOSIS — E785 Hyperlipidemia, unspecified: Secondary | ICD-10-CM | POA: Diagnosis not present

## 2017-05-10 DIAGNOSIS — N185 Chronic kidney disease, stage 5: Secondary | ICD-10-CM | POA: Diagnosis not present

## 2017-05-10 DIAGNOSIS — E291 Testicular hypofunction: Secondary | ICD-10-CM | POA: Diagnosis not present

## 2017-05-10 DIAGNOSIS — I359 Nonrheumatic aortic valve disorder, unspecified: Secondary | ICD-10-CM | POA: Diagnosis not present

## 2017-05-10 DIAGNOSIS — E1121 Type 2 diabetes mellitus with diabetic nephropathy: Secondary | ICD-10-CM | POA: Diagnosis not present

## 2017-05-10 DIAGNOSIS — Z23 Encounter for immunization: Secondary | ICD-10-CM | POA: Diagnosis not present

## 2017-05-10 DIAGNOSIS — E039 Hypothyroidism, unspecified: Secondary | ICD-10-CM | POA: Diagnosis not present

## 2017-05-14 DIAGNOSIS — I120 Hypertensive chronic kidney disease with stage 5 chronic kidney disease or end stage renal disease: Secondary | ICD-10-CM | POA: Diagnosis not present

## 2017-05-14 DIAGNOSIS — R600 Localized edema: Secondary | ICD-10-CM | POA: Diagnosis not present

## 2017-05-14 DIAGNOSIS — M109 Gout, unspecified: Secondary | ICD-10-CM | POA: Diagnosis not present

## 2017-05-14 DIAGNOSIS — N185 Chronic kidney disease, stage 5: Secondary | ICD-10-CM | POA: Diagnosis not present

## 2017-05-14 DIAGNOSIS — Z6827 Body mass index (BMI) 27.0-27.9, adult: Secondary | ICD-10-CM | POA: Diagnosis not present

## 2017-05-14 DIAGNOSIS — E349 Endocrine disorder, unspecified: Secondary | ICD-10-CM | POA: Diagnosis not present

## 2017-05-14 DIAGNOSIS — R339 Retention of urine, unspecified: Secondary | ICD-10-CM | POA: Diagnosis not present

## 2017-05-14 DIAGNOSIS — N2581 Secondary hyperparathyroidism of renal origin: Secondary | ICD-10-CM | POA: Diagnosis not present

## 2017-05-21 DIAGNOSIS — H903 Sensorineural hearing loss, bilateral: Secondary | ICD-10-CM | POA: Diagnosis not present

## 2017-05-23 ENCOUNTER — Emergency Department (HOSPITAL_COMMUNITY): Payer: Medicare Other

## 2017-05-23 ENCOUNTER — Other Ambulatory Visit: Payer: Self-pay

## 2017-05-23 ENCOUNTER — Encounter (HOSPITAL_COMMUNITY): Payer: Self-pay

## 2017-05-23 ENCOUNTER — Emergency Department (HOSPITAL_COMMUNITY)
Admission: EM | Admit: 2017-05-23 | Discharge: 2017-05-23 | Disposition: A | Discharge status: Home / Self Care | Payer: Medicare Other | Attending: Emergency Medicine | Admitting: Emergency Medicine

## 2017-05-23 DIAGNOSIS — Z7984 Long term (current) use of oral hypoglycemic drugs: Secondary | ICD-10-CM | POA: Diagnosis not present

## 2017-05-23 DIAGNOSIS — R011 Cardiac murmur, unspecified: Secondary | ICD-10-CM | POA: Insufficient documentation

## 2017-05-23 DIAGNOSIS — W01190A Fall on same level from slipping, tripping and stumbling with subsequent striking against furniture, initial encounter: Secondary | ICD-10-CM | POA: Diagnosis not present

## 2017-05-23 DIAGNOSIS — S299XXA Unspecified injury of thorax, initial encounter: Secondary | ICD-10-CM | POA: Diagnosis not present

## 2017-05-23 DIAGNOSIS — Z7982 Long term (current) use of aspirin: Secondary | ICD-10-CM | POA: Insufficient documentation

## 2017-05-23 DIAGNOSIS — E1122 Type 2 diabetes mellitus with diabetic chronic kidney disease: Secondary | ICD-10-CM | POA: Insufficient documentation

## 2017-05-23 DIAGNOSIS — N185 Chronic kidney disease, stage 5: Secondary | ICD-10-CM | POA: Diagnosis not present

## 2017-05-23 DIAGNOSIS — R0782 Intercostal pain: Secondary | ICD-10-CM | POA: Diagnosis not present

## 2017-05-23 DIAGNOSIS — R338 Other retention of urine: Secondary | ICD-10-CM | POA: Diagnosis not present

## 2017-05-23 DIAGNOSIS — R21 Rash and other nonspecific skin eruption: Secondary | ICD-10-CM | POA: Insufficient documentation

## 2017-05-23 DIAGNOSIS — W19XXXA Unspecified fall, initial encounter: Secondary | ICD-10-CM

## 2017-05-23 DIAGNOSIS — Z79899 Other long term (current) drug therapy: Secondary | ICD-10-CM | POA: Diagnosis not present

## 2017-05-23 DIAGNOSIS — I120 Hypertensive chronic kidney disease with stage 5 chronic kidney disease or end stage renal disease: Secondary | ICD-10-CM | POA: Diagnosis not present

## 2017-05-23 DIAGNOSIS — E039 Hypothyroidism, unspecified: Secondary | ICD-10-CM | POA: Insufficient documentation

## 2017-05-23 DIAGNOSIS — R0781 Pleurodynia: Secondary | ICD-10-CM | POA: Diagnosis not present

## 2017-05-23 MED ORDER — ACETAMINOPHEN 325 MG PO TABS
650.0000 mg | ORAL_TABLET | Freq: Once | ORAL | Status: AC
  Administered 2017-05-23: 650 mg via ORAL
  Filled 2017-05-23: qty 2

## 2017-05-23 MED ORDER — CARVEDILOL 3.125 MG PO TABS
3.1250 mg | ORAL_TABLET | Freq: Once | ORAL | Status: AC
  Administered 2017-05-23: 3.125 mg via ORAL
  Filled 2017-05-23: qty 1

## 2017-05-23 NOTE — Discharge Instructions (Signed)
You were seen in the emergency department today after a fall.  Your x-ray did not show any fractures or dislocations of your ribs.  You were given Tylenol in the emergency department.  Continue to take Tylenol as needed at home for discomfort.  Your blood pressure was noted to be elevated in the emergency department.  Continue to take your blood pressure medications as prescribed.  Follow-up with your primary care provider in 1 week for reevaluation of your discomfort as well as a recheck of your blood pressure.  Return to the emergency department for any new or worsening symptoms including but not limited to chest pain, difficulty breathing, change in your vision, numbness, weakness, or any other concerns.

## 2017-05-23 NOTE — ED Triage Notes (Signed)
Pt reports L sided pain at his lower ribs. He had a mechanical fall this morning around 9a and hit his L ribs on the padded couch arm. Did not hit his head or lose consciousness. A&Ox4. Ambulatory.

## 2017-05-23 NOTE — ED Provider Notes (Signed)
[facility] DEPT Provider Note   CSN: [id number] Arrival date & time: 05/23/17  1536     History   Chief Complaint Chief Complaint  Patient presents with  . Fall    HPI Larry [name] is a 77 y.o. male with a history of CKD, hypertension, heart failure, non-Hodgkin's lymphoma, splenectomy, and pancreatectomy who presents to the emergency department status post mechanical fall this morning at 9:30 complaning of left sided rib pain.  Patient states he was ambulating to answer the phone when he tripped over the carpet. This resulted in him falling onto the arm of the couch and then subsequently to the floor..  States that he hit his left ribs on the arm of the couch. No head injury or loss of consciousness.  Patient states he is only having pain in the left rib region following the fall.  States the pain is a 2 out of 10 at baseline however worsens to a 5 out of 10 with deep breathing or sneezing.  Applied some ice earlier today with some relief.  No other specific alleviating or aggravating factors.  Patient denies neck pain, back pain, abdominal pain, numbness, weakness, or dyspnea.  No dyspnea, chest pain, lightheadedness, or dizziness prior to fall or at present.  HPI  Past Medical History:  Diagnosis Date  . Anemia of chronic renal failure, stage 4 (severe) ([place]) 09/06/2016  . Chronic kidney disease    "I see [name]" (03/07/2016)  . Chronic kidney disease, stage 5 ([place])   . 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 GERD (gastroesophageal reflux disease)   . Gout   . H/O exposure to tuberculosis   . History of kidney stones    "they passed" (03/07/2016)  . Hyperkalemia   . Hypertension   . Hypotestosteronism 03/29/2011  . Hypothyroidism   . Non Hodgkin's lymphoma ([place])   . Splenic marginal zone b-cell lymphoma ([place]) 03/29/2011  . Thyroid disease   . Type II diabetes mellitus ([place]) dx'd ~ 2005    Patient Active Problem List   Diagnosis  Date Noted  . Azotemia   . Uremia 10/18/2016  . Skin rash 10/18/2016  . General weakness 10/18/2016  . Chronic kidney disease, stage 5 ([place])   . Anemia of chronic renal failure, stage 4 (severe) ([place]) 09/06/2016  . 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 Acute diastolic CHF (congestive heart failure) ([place]) 03/10/2016  . Cellulitis, neck 03/09/2016  . Acute congestive heart failure ([place])   . Pressure injury of skin 03/08/2016  . Acute renal failure superimposed on stage 4 chronic kidney disease ([place]) 03/08/2016  . CHF (congestive heart failure) ([place]) 03/07/2016  . Elevated troponin 03/07/2016  . Acute kidney injury superimposed on CKD ([place]) 03/07/2016  . Fever 03/07/2016  . Urinary retention s/p foley x2 08/19/2011  . Acute renal failure ([place]) 08/19/2011  . Chronic renal insufficiency, stage II (mild) 08/19/2011  . Duodenal ulcer, perforated, s/p Omental patch [id number] 08/11/2011  . Splenic marginal zone b-cell lymphoma ([place]) 03/29/2011  . Hypotestosteronism 03/29/2011  . Diabetes mellitus ([place]) 08/28/2007  . Essential hypertension 08/28/2007  . ALLERGIC RHINITIS 08/28/2007  . PLEURAL EFFUSION, LEFT 08/28/2007    Past Surgical History:  Procedure Laterality Date  . APPENDECTOMY  1948  . INGUINAL HERNIA REPAIR Right ~ 1948  . LAPAROTOMY  08/11/2011   Procedure: EXPLORATORY LAPAROTOMY;  Surgeon: Zenovia Jarred, MD;  Location: [facility] OR;  Service: General;  Laterality: N/A;  . PANCREATECTOMY  2009   Partial w/ splenectomy  . perforation of duodenum repair     2013  . SPLENECTOMY, TOTAL  2009  . TONSILLECTOMY         Home Medications    Prior to Admission medications   Medication Sig Start Date End Date Taking? Authorizing Provider  amLODipine (NORVASC) 5 MG tablet Take 5 mg by mouth daily.    [provider]  aspirin EC 81 MG EC tablet Take 1 tablet (81 mg  total) by mouth daily. 03/13/16   Orson Eva, MD  atorvastatin (LIPITOR) 10 MG tablet Take 10 mg by mouth at bedtime.     [provider]  carvedilol (COREG) 3.125 MG tablet Take 3.125 mg by mouth 2 (two) times daily with a meal.    [provider]  COLCRYS 0.6 MG tablet  02/06/17   [provider]  doxazosin (CARDURA) 8 MG tablet Take 8 mg by mouth. 01/17/17   [provider]  escitalopram (LEXAPRO) 10 MG tablet Take 1 tablet (10 mg total) by mouth daily. 12/08/16   Volanda Napoleon, MD  finasteride (PROSCAR) 5 MG tablet  12/28/16   [provider]  fluocinonide cream (LIDEX) 0.05 %  12/19/16   [provider]  furosemide (LASIX) 80 MG tablet Take 1 tablet (80 mg total) by mouth 2 (two) times daily. Pt takes two tablets in the morning and one at bedtime. Patient taking differently: Take 40 mg by mouth 2 (two) times daily. Pt takes one tablet in the morning and one at bedtime. 10/20/16   Rosita Fire, MD  JANUVIA 50 MG tablet  12/08/16   [provider]  levothyroxine (SYNTHROID, LEVOTHROID) 175 MCG tablet Take 175 mcg by mouth daily before breakfast.    [provider]  omeprazole (PRILOSEC) 20 MG capsule Take 20 mg by mouth 2 (two) times daily.     [provider]  tamsulosin (FLOMAX) 0.4 MG CAPS capsule  12/28/16   [provider]  triamcinolone ointment (KENALOG) 0.1 % Apply 1 application topically 2 (two) times daily as needed (for irritation).     [provider]    Family History Family History  Problem Relation Age of Onset  . Tuberculosis Father     Social History Social History   Tobacco Use  . Smoking status: Never Smoker  . Smokeless tobacco: Never Used  Substance Use Topics  . Alcohol use: No    Alcohol/week: 0.0 oz  . Drug use: No     Allergies   Iohexol and Allopurinol   Review of Systems Review of Systems  Constitutional: Negative for chills and fever.    Respiratory: Negative for shortness of breath.   Cardiovascular: Negative for chest pain and palpitations.  Gastrointestinal: Negative for abdominal pain.  Musculoskeletal: Negative for back pain and neck pain.       Positive for left-sided rib pain.  Neurological: Negative for dizziness, weakness, light-headedness and numbness.  All other systems reviewed and are negative.   Physical Exam Updated Vital Signs BP (!) 173/70 (BP Location: Right Arm)   Pulse 63   Temp 97.6 F (36.4 C) (Oral)   Resp 16   SpO2 96%   Physical Exam  Constitutional: He appears well-developed and well-nourished.  Non-toxic appearance. No distress.  HENT:  Head: Normocephalic and atraumatic. Head is without raccoon's eyes and without Battle's sign.  Eyes: Conjunctivae are normal.  Right eye exhibits no discharge. Left eye exhibits no discharge.  Neck: Normal range of motion. Neck supple. No spinous process tenderness and no muscular tenderness present.  Cardiovascular: Normal rate and regular rhythm.  Murmur heard.  Systolic murmur is present. Pulmonary/Chest: Breath sounds normal. No respiratory distress. He has no wheezes. He has no rales. He exhibits tenderness (Left lower anterior and lateral ribs.). He exhibits no crepitus, no edema and no swelling.  Abdominal: Soft. He exhibits no distension. There is no tenderness. There is no rigidity, no rebound and no guarding.  Musculoskeletal:  Back: There is no midline or paraspinal muscle tenderness. Extremities: Patient has good range of motion to all joints.  There is no obvious deformity.  No bony tenderness.  Patient has 1+ pitting edema to bilateral lower extremities-states this is baseline for him.  Neurological: He is alert.  Clear speech.  Sensation grossly intact bilateral upper and lower extremities.  Patient has 5 out of 5 grip strength and 5 out of 5 plantar and dorsi flexion strength bilaterally.  Gait is intact.  Skin: Skin is warm and dry. Rash  (Erythematous rash to the lower abdomen and upper extremities, rash to abdomen has some scaling,-patient is being seen by dermatology for this, no signs of  infection.) noted.  Psychiatric: He has a normal mood and affect. His behavior is normal.  Nursing note and vitals reviewed.  ED Treatments / Results  Labs (all labs ordered are listed, but only abnormal results are displayed) Labs Reviewed - No data to display  EKG  EKG Interpretation None       Radiology Dg Ribs Unilateral W/chest Left  Result Date: 05/23/2017 CLINICAL DATA:  77 year old male with a history of fall and left-sided rib pain EXAM: LEFT RIBS AND CHEST - 3+ VIEW COMPARISON:  CT 10/19/2016, plain film 10/18/2016 FINDINGS: Cardiomediastinal silhouette unchanged in size and contour. No evidence of central vascular congestion. No interlobular septal thickening. Low lung volumes crowding the interstitium. Ill-defined opacity in the right medial lung base similar to prior though more pronounced with the low lung volumes. Fiducial marker on the lower left chest wall. No acute displaced fracture identified IMPRESSION: Low lung volumes and chronic changes without evidence of acute cardiopulmonary disease. No evidence of acute displaced rib fracture. Electronically Signed   By: Corrie Mckusick D.O.   On: 05/23/2017 20:15   Procedures Procedures (including critical care time)  Medications Ordered in ED Medications  acetaminophen (TYLENOL) tablet 650 mg (not administered)  carvedilol (COREG) tablet 3.125 mg (3.125 mg Oral Given 05/23/17 2013)    Initial Impression / Assessment and Plan / ED Course  I have reviewed the triage vital signs and the nursing notes.  Pertinent labs & imaging results that were available during my care of the patient were reviewed by me and considered in my medical decision making (see chart for details).  Patient presents status post mechanical fall this morning complaining of left-sided rib pain.   Patient is nontoxic-appearing, in no apparent distress, vitals are within normal limits other than blood pressure which is noted to be elevated in the emergency department.  He typically takes medicine in the morning and in the evening time, will administer a dose of his Carvedilol in the emergency department.  Patient without head injury or loss of consciousness. Patient without signs of serious head, neck, or back injury. Patient has no focal neurologic deficits or midline spinal tenderness to palpation, doubt fracture or dislocation of the spine, doubt head bleed.  Only area of tenderness on exam is his left lower anterior and lateral rib region, no overlying edema, deformity, or crepitus.  X-ray negative for fracture or dislocation.  Patient given Tylenol in the emergency department, would prefer no stronger pain medication.  Blood pressure improved, still elevated, discussed this with patient and the need to take his medication as prescribed and have this rechecked by PCP. I discussed results, treatment plan, need for PCP follow-up, and return precautions with the patient. Provided opportunity for questions, patient confirmed understanding and is in agreement with plan.   Findings and plan of care discussed with supervising physician Dr. Jeneen Rinks who personally evaluated and examined this patient and is in agreement with plan.   Final Clinical Impressions(s) / ED Diagnoses   Final diagnoses:  Fall, initial encounter    ED Discharge Orders    None       Leafy Kindle 05/23/17 2242    Tanna Furry, MD 05/23/17 [phone]

## 2017-05-31 MED FILL — JANUVIA 50 MG TABLET: 50 | 30 days supply | Qty: 30 | Fill #3

## 2017-05-31 MED FILL — ESCITALOPRAM 10 MG TABLET: 10 | 90 days supply | Qty: 90 | Fill #2

## 2017-06-01 ENCOUNTER — Inpatient Hospital Stay: Payer: Medicare Other

## 2017-06-01 ENCOUNTER — Telehealth: Payer: Self-pay | Admitting: *Deleted

## 2017-06-01 ENCOUNTER — Encounter: Payer: Self-pay | Admitting: Hematology & Oncology

## 2017-06-01 ENCOUNTER — Other Ambulatory Visit: Payer: Self-pay

## 2017-06-01 ENCOUNTER — Inpatient Hospital Stay: Payer: Medicare Other | Attending: Hematology & Oncology | Admitting: Hematology & Oncology

## 2017-06-01 VITALS — BP 167/63 | HR 63 | Temp 97.6°F | Wt 164.1 lb

## 2017-06-01 DIAGNOSIS — E349 Endocrine disorder, unspecified: Secondary | ICD-10-CM

## 2017-06-01 DIAGNOSIS — N184 Chronic kidney disease, stage 4 (severe): Secondary | ICD-10-CM

## 2017-06-01 DIAGNOSIS — D631 Anemia in chronic kidney disease: Secondary | ICD-10-CM | POA: Insufficient documentation

## 2017-06-01 DIAGNOSIS — Z9081 Acquired absence of spleen: Secondary | ICD-10-CM | POA: Diagnosis not present

## 2017-06-01 DIAGNOSIS — E291 Testicular hypofunction: Secondary | ICD-10-CM | POA: Diagnosis not present

## 2017-06-01 DIAGNOSIS — N189 Chronic kidney disease, unspecified: Secondary | ICD-10-CM | POA: Diagnosis not present

## 2017-06-01 DIAGNOSIS — N171 Acute kidney failure with acute cortical necrosis: Secondary | ICD-10-CM

## 2017-06-01 DIAGNOSIS — Z8572 Personal history of non-Hodgkin lymphomas: Secondary | ICD-10-CM | POA: Diagnosis not present

## 2017-06-01 DIAGNOSIS — I5020 Unspecified systolic (congestive) heart failure: Secondary | ICD-10-CM

## 2017-06-01 LAB — CBC WITH DIFFERENTIAL (CANCER CENTER ONLY)
Basophils Absolute: 0 10*3/uL (ref 0.0–0.1)
Basophils Relative: 0 %
EOS PCT: 16 %
Eosinophils Absolute: 1.3 10*3/uL — ABNORMAL HIGH (ref 0.0–0.5)
HCT: 33 % — ABNORMAL LOW (ref 38.7–49.9)
Hemoglobin: 11 g/dL — ABNORMAL LOW (ref 13.0–17.1)
LYMPHS ABS: 1.4 10*3/uL (ref 0.9–3.3)
LYMPHS PCT: 18 %
MCH: 32.7 pg (ref 28.0–33.4)
MCHC: 33.3 g/dL (ref 32.0–35.9)
MCV: 98.2 fL — AB (ref 82.0–98.0)
Monocytes Absolute: 1.2 10*3/uL — ABNORMAL HIGH (ref 0.1–0.9)
Monocytes Relative: 16 %
Neutro Abs: 3.9 10*3/uL (ref 1.5–6.5)
Neutrophils Relative %: 50 %
PLATELETS: 189 10*3/uL (ref 145–400)
RBC: 3.36 MIL/uL — AB (ref 4.20–5.70)
RDW: 14.4 % (ref 11.1–15.7)
WBC Count: 7.8 10*3/uL (ref 4.0–10.0)

## 2017-06-01 LAB — IRON AND TIBC
Iron: 93 ug/dL (ref 42–163)
SATURATION RATIOS: 48 % (ref 42–163)
TIBC: 195 ug/dL — AB (ref 202–409)
UIBC: 102 ug/dL

## 2017-06-01 LAB — CMP (CANCER CENTER ONLY)
ALT: 19 U/L (ref 0–55)
ANION GAP: 9 (ref 5–15)
AST: 20 U/L (ref 5–34)
Albumin: 3 g/dL — ABNORMAL LOW (ref 3.5–5.0)
Alkaline Phosphatase: 110 U/L — ABNORMAL HIGH (ref 26–84)
BUN: 66 mg/dL — ABNORMAL HIGH (ref 7–22)
CO2: 26 mmol/L (ref 18–33)
Calcium: 8.7 mg/dL (ref 8.0–10.3)
Chloride: 108 mmol/L (ref 98–108)
Creatinine: 4 mg/dL (ref 0.70–1.30)
GLUCOSE: 146 mg/dL — AB (ref 73–118)
POTASSIUM: 5.3 mmol/L — AB (ref 3.3–4.7)
Sodium: 143 mmol/L (ref 128–145)
TOTAL PROTEIN: 6.7 g/dL (ref 6.4–8.1)
Total Bilirubin: 0.5 mg/dL (ref 0.2–1.2)

## 2017-06-01 LAB — FERRITIN: Ferritin: 222 ng/mL (ref 22–316)

## 2017-06-01 NOTE — Telephone Encounter (Signed)
Critical Value Creatinine 4.0 Dr Ennever notified. No orders at this time.  

## 2017-06-01 NOTE — Progress Notes (Signed)
Hematology and Oncology Follow Up Visit  Larry [name] [id number] Sep 24, 1940 77 y.o. 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  He actually looks pretty good.  Looks like he may have lost a little bit of weight.  His wife has a lot of concerns regarding him.  I do not think any of these concerns have anything to do with him having a history of his spleen taken out.  He has had some periods of falling.  He has had some poor hearing.  He had his hearing study done.  He needs to have some hearing aids.  He still has a Foley catheter.  He apparently has bladder dysfunction.  He has renal insufficiency.  He has seen nephrology.  He does have an enlarged prostate.  As such, we have to hold on giving him testosterone.  His urologist does not want him having testosterone.  I will have to talk to his urologist regarding this.  Testosterone really helps his mental status.  We did check his iron studies today.  His ferritin was 222 with an iron saturation of 48%.  His last testosterone level in December was 249.  ECOG Performance Status: 2 - Symptomatic, <50% confined to bed  Medications:  Allergies as of 06/01/2017      Reactions   Iohexol Other (See Comments)   Pt states that he was told by MD not to ever have contrast again.     Allopurinol Rash      Medication List        Accurate as of 06/01/17  5:31 PM. Always use your most recent med list.          amLODipine 5 MG tablet Commonly known as:  NORVASC Take 5 mg by mouth daily.   aspirin 81 MG EC tablet Take 1 tablet (81 mg total) by mouth daily.   atorvastatin 10 MG tablet Commonly known as:   LIPITOR Take 10 mg by mouth at bedtime.   carvedilol 3.125 MG tablet Commonly known as:  COREG Take 3.125 mg by mouth 2 (two) times daily with a meal.   COLCRYS 0.6 MG tablet Generic drug:  colchicine Take 0.6 mg by mouth daily as needed (Gout).   doxazosin 8 MG tablet Commonly known as:  CARDURA Take 8 mg by mouth.   escitalopram 10 MG tablet Commonly known as:  LEXAPRO Take 1 tablet (10 mg total) by mouth daily.   finasteride 5 MG tablet Commonly known as:  PROSCAR Take 5 mg by mouth daily.   fluocinonide cream 0.05 % Commonly known as:  LIDEX Apply 1 application topically 2 (two) times daily as needed.   furosemide 80 MG tablet Commonly known as:  LASIX Take 80 mg by mouth 2 (two) times daily. Take one tablet in the morning and one at bedtime   JANUVIA 50 MG tablet Generic drug:  sitaGLIPtin Take 25 mg by mouth daily.   levothyroxine 175 MCG tablet Commonly known as:  SYNTHROID, LEVOTHROID Take 150 mcg by mouth daily before breakfast.   omeprazole 20 MG capsule Commonly known as:  PRILOSEC Take 20 mg by mouth 2 (two) times daily.   tamsulosin 0.4 MG  Caps capsule Commonly known as:  FLOMAX Take 0.4 mg by mouth daily after supper.   triamcinolone ointment 0.1 % Commonly known as:  KENALOG Apply 1 application topically 2 (two) times daily as needed (for irritation).       Allergies:  Allergies  Allergen Reactions  . Iohexol Other (See Comments)    Pt states that he was told by MD not to ever have contrast again.    . Allopurinol Rash    Past Medical History, Surgical history, Social history, and Family History were reviewed and updated.  Review of Systems: Review of Systems  All other systems reviewed and are negative. Marland Kitchen   Physical Exam:  weight is 164 lb 1.9 oz (74.4 kg). His oral temperature is 97.6 F (36.4 C). His blood pressure is 167/63 (abnormal) and his pulse is 63. His oxygen saturation is 99%.   Wt Readings from Last 3 Encounters:    06/01/17 164 lb 1.9 oz (74.4 kg)  04/20/17 171 lb 12.8 oz (77.9 kg)  03/21/17 171 lb 8 oz (77.8 kg)    Physical Exam  Constitutional: He is oriented to person, place, and time.  HENT:  Head: Normocephalic and atraumatic.  Mouth/Throat: Oropharynx is clear and moist.  Eyes: EOM are normal. Pupils are equal, round, and reactive to light.  Neck: Normal range of motion.  Cardiovascular: Normal rate, regular rhythm and normal heart sounds.  Pulmonary/Chest: Effort normal and breath sounds normal.  Abdominal: Soft. Bowel sounds are normal.  Musculoskeletal: Normal range of motion. He exhibits no edema, tenderness or deformity.  Lymphadenopathy:    He has no cervical adenopathy.  Neurological: He is alert and oriented to person, place, and time.  Skin: Skin is warm and dry. No rash noted. No erythema.  Psychiatric: He has a normal mood and affect. His behavior is normal. Judgment and thought content normal.  Vitals reviewed.    Lab Results  Component Value Date   WBC 7.8 06/01/2017   HGB 9.3 (L) 04/20/2017   HCT 33.0 (L) 06/01/2017   MCV 98.2 (H) 06/01/2017   PLT 189 06/01/2017   Lab Results  Component Value Date   FERRITIN 222 06/01/2017   IRON 93 06/01/2017   TIBC 195 (L) 06/01/2017   UIBC 102 06/01/2017   IRONPCTSAT 48 06/01/2017   Lab Results  Component Value Date   RETICCTPCT 0.9 03/29/2011   RBC 3.36 (L) 06/01/2017   RETICCTABS 31.8 03/29/2011   No results found for: KPAFRELGTCHN, LAMBDASER, KAPLAMBRATIO No results found for: Kandis Cocking, [facility] Lab Results  Component Value Date   TOTALPROTELP 6.7 07/14/2011   ALBUMINELP 56.6 07/14/2011   A1GS 5.7 (H) 07/14/2011   A2GS 12.7 (H) 07/14/2011   BETS 4.9 07/14/2011   BETA2SER 3.8 07/14/2011   GAMS 16.3 07/14/2011   MSPIKE NOT DET 07/14/2011   SPEI * 07/14/2011     Chemistry      Component Value Date/Time   NA 143 06/01/2017 1017   NA 140 04/20/2017 1259   NA 142 02/15/2016 0953   K 5.3 (H) 06/01/2017  1017   K 4.6 04/20/2017 1259   K 4.4 02/15/2016 0953   CL 108 06/01/2017 1017   CL 106 04/20/2017 1259   CO2 26 06/01/2017 1017   CO2 26 04/20/2017 1259   CO2 23 02/15/2016 0953   BUN 66 (H) 06/01/2017 1017   BUN 43 (H) 04/20/2017 1259   BUN 47.4 (H) 02/15/2016 0953   CREATININE 3.0 (H) 04/20/2017 1259  CREATININE 2.6 (H) 02/15/2016 0953      Component Value Date/Time   CALCIUM 8.7 06/01/2017 1017   CALCIUM 8.8 04/20/2017 1259   CALCIUM 9.1 02/15/2016 0953   ALKPHOS 110 (H) 06/01/2017 1017   ALKPHOS 103 (H) 04/20/2017 1259   ALKPHOS 99 02/15/2016 0953   AST 20 06/01/2017 1017   AST 16 02/15/2016 0953   ALT 19 06/01/2017 1017   ALT 16 04/20/2017 1259   ALT 7 02/15/2016 0953   BILITOT 0.5 06/01/2017 1017   BILITOT 0.93 02/15/2016 0953      Impression and Plan: Larry [name] is a very pleasant 77 yo caucasian gentleman with anemia of chronic renal failure and hypotestosteronemia. He also has history of splenic marginal zone lymphoma s/p splenectomy 2009.   He completed one cycle of Rituxan in April 2010.   Again, we will have to talk to his urologist about his testosterone injections.  He does not need any Aranesp today.  We will plan to get him back to see [place] in another 3-4 weeks.     Volanda Napoleon, MD 2/1/20195:31 PM

## 2017-06-02 LAB — TESTOSTERONE: Testosterone: 387 ng/dL (ref 264–916)

## 2017-06-20 DIAGNOSIS — R338 Other retention of urine: Secondary | ICD-10-CM | POA: Diagnosis not present

## 2017-06-21 ENCOUNTER — Other Ambulatory Visit: Payer: Self-pay | Admitting: Family

## 2017-06-21 DIAGNOSIS — L309 Dermatitis, unspecified: Secondary | ICD-10-CM

## 2017-06-22 ENCOUNTER — Telehealth: Payer: Self-pay | Admitting: *Deleted

## 2017-06-22 ENCOUNTER — Inpatient Hospital Stay (HOSPITAL_BASED_OUTPATIENT_CLINIC_OR_DEPARTMENT_OTHER): Payer: Medicare Other | Admitting: Hematology & Oncology

## 2017-06-22 ENCOUNTER — Inpatient Hospital Stay: Payer: Medicare Other

## 2017-06-22 ENCOUNTER — Other Ambulatory Visit: Payer: Self-pay

## 2017-06-22 ENCOUNTER — Encounter: Payer: Self-pay | Admitting: Hematology & Oncology

## 2017-06-22 VITALS — BP 145/55 | HR 70 | Temp 97.8°F | Resp 19 | Wt 169.0 lb

## 2017-06-22 DIAGNOSIS — I5021 Acute systolic (congestive) heart failure: Secondary | ICD-10-CM

## 2017-06-22 DIAGNOSIS — Z9081 Acquired absence of spleen: Secondary | ICD-10-CM | POA: Diagnosis not present

## 2017-06-22 DIAGNOSIS — E291 Testicular hypofunction: Secondary | ICD-10-CM

## 2017-06-22 DIAGNOSIS — N184 Chronic kidney disease, stage 4 (severe): Secondary | ICD-10-CM

## 2017-06-22 DIAGNOSIS — E349 Endocrine disorder, unspecified: Secondary | ICD-10-CM

## 2017-06-22 DIAGNOSIS — Z8572 Personal history of non-Hodgkin lymphomas: Secondary | ICD-10-CM | POA: Diagnosis not present

## 2017-06-22 DIAGNOSIS — I5020 Unspecified systolic (congestive) heart failure: Secondary | ICD-10-CM

## 2017-06-22 DIAGNOSIS — D631 Anemia in chronic kidney disease: Secondary | ICD-10-CM

## 2017-06-22 DIAGNOSIS — N189 Chronic kidney disease, unspecified: Secondary | ICD-10-CM | POA: Diagnosis not present

## 2017-06-22 LAB — IRON AND TIBC
Iron: 70 ug/dL (ref 42–163)
Saturation Ratios: 39 % — ABNORMAL LOW (ref 42–163)
TIBC: 179 ug/dL — AB (ref 202–409)
UIBC: 109 ug/dL

## 2017-06-22 LAB — CMP (CANCER CENTER ONLY)
ALBUMIN: 2.9 g/dL — AB (ref 3.5–5.0)
ALT: 16 U/L (ref 10–47)
AST: 20 U/L (ref 11–38)
Alkaline Phosphatase: 115 U/L — ABNORMAL HIGH (ref 26–84)
Anion gap: 6 (ref 5–15)
BUN: 62 mg/dL — AB (ref 7–22)
CO2: 24 mmol/L (ref 18–33)
Calcium: 9 mg/dL (ref 8.0–10.3)
Chloride: 111 mmol/L — ABNORMAL HIGH (ref 98–108)
Creatinine: 3.3 mg/dL (ref 0.60–1.20)
GLUCOSE: 122 mg/dL — AB (ref 73–118)
POTASSIUM: 4.8 mmol/L — AB (ref 3.3–4.7)
SODIUM: 141 mmol/L (ref 128–145)
TOTAL PROTEIN: 6.6 g/dL (ref 6.4–8.1)
Total Bilirubin: 0.6 mg/dL (ref 0.2–1.6)

## 2017-06-22 LAB — CBC WITH DIFFERENTIAL (CANCER CENTER ONLY)
BASOS ABS: 0 10*3/uL (ref 0.0–0.1)
Basophils Relative: 0 %
EOS PCT: 13 %
Eosinophils Absolute: 1.3 10*3/uL — ABNORMAL HIGH (ref 0.0–0.5)
HCT: 28.9 % — ABNORMAL LOW (ref 38.7–49.9)
Hemoglobin: 9.8 g/dL — ABNORMAL LOW (ref 13.0–17.1)
LYMPHS PCT: 15 %
Lymphs Abs: 1.5 10*3/uL (ref 0.9–3.3)
MCH: 33 pg (ref 28.0–33.4)
MCHC: 33.9 g/dL (ref 32.0–35.9)
MCV: 97.3 fL (ref 82.0–98.0)
MONO ABS: 1.3 10*3/uL — AB (ref 0.1–0.9)
Monocytes Relative: 14 %
Neutro Abs: 5.6 10*3/uL (ref 1.5–6.5)
Neutrophils Relative %: 58 %
PLATELETS: 223 10*3/uL (ref 145–400)
RBC: 2.97 MIL/uL — ABNORMAL LOW (ref 4.20–5.70)
RDW: 14.7 % (ref 11.1–15.7)
WBC Count: 9.7 10*3/uL (ref 4.0–10.0)

## 2017-06-22 LAB — FERRITIN: FERRITIN: 260 ng/mL (ref 22–316)

## 2017-06-22 MED ORDER — DARBEPOETIN ALFA 300 MCG/0.6ML IJ SOSY
PREFILLED_SYRINGE | INTRAMUSCULAR | Status: AC
  Filled 2017-06-22: qty 0.6

## 2017-06-22 MED ORDER — TESTOSTERONE CYPIONATE 200 MG/ML IM SOLN
400.0000 mg | INTRAMUSCULAR | Status: DC
  Administered 2017-06-22: 400 mg via INTRAMUSCULAR

## 2017-06-22 MED ORDER — TESTOSTERONE CYPIONATE 200 MG/ML IM SOLN
INTRAMUSCULAR | Status: AC
  Filled 2017-06-22: qty 2

## 2017-06-22 MED ORDER — DARBEPOETIN ALFA 300 MCG/0.6ML IJ SOSY
300.0000 ug | PREFILLED_SYRINGE | Freq: Once | INTRAMUSCULAR | Status: AC
  Administered 2017-06-22: 300 ug via SUBCUTANEOUS

## 2017-06-22 NOTE — Patient Instructions (Signed)

## 2017-06-22 NOTE — Telephone Encounter (Signed)
Critical Value Creatinine 3.3 Dr Ennever notified. No orders at this time 

## 2017-06-22 NOTE — Progress Notes (Signed)
Hematology and Oncology Follow Up Visit  Larry [name] [id number] 06-Sep-1940 77 y.o. 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  He actually looks pretty good.  Looks like he may have lost a little bit of weight.  He definitely is having problems emotionally.  Whenever he does not get testosterone, his whole attitude definitely changes.  His whole demeanor is much less "bright".  I really think that he needs his testosterone.  I realize that he does have an enlarged prostate.  He has a indwelling Foley catheter.  I think his quality of life would definitely be improved by testosterone supplementation.  I taught he and his wife about this again.  They both are in agreement that testosterone makes his life better.  Thankfully, his renal function is not worsening.  His iron studies we saw him 3 weeks ago showed a ferritin of 222 with an iron saturation of 48%.  Overall, his performance status is ECOG 2-3.  Medications:  Allergies as of 06/22/2017      Reactions   Iohexol Other (See Comments)   Pt states that he was told by MD not to ever have contrast again.     Allopurinol Rash      Medication List        Accurate as of 06/22/17 11:33 AM. Always use your most recent med list.          amLODipine 5 MG tablet Commonly known as:  NORVASC Take 5 mg by mouth daily.   aspirin 81 MG EC tablet Take 1 tablet (81 mg total) by mouth daily.   atorvastatin 10 MG tablet Commonly known as:  LIPITOR Take 10 mg by mouth at bedtime.   carvedilol 3.125 MG tablet Commonly known as:  COREG Take 3.125 mg by mouth 2 (two) times daily with a meal.     COLCRYS 0.6 MG tablet Generic drug:  colchicine Take 0.6 mg by mouth daily as needed (Gout).   doxazosin 8 MG tablet Commonly known as:  CARDURA Take 8 mg by mouth.   escitalopram 10 MG tablet Commonly known as:  LEXAPRO Take 1 tablet (10 mg total) by mouth daily.   finasteride 5 MG tablet Commonly known as:  PROSCAR Take 5 mg by mouth daily.   fluocinonide cream 0.05 % Commonly known as:  LIDEX Apply 1 application topically 2 (two) times daily as needed.   furosemide 80 MG tablet Commonly known as:  LASIX Take 80 mg by mouth 2 (two) times daily. Take one tablet in the morning and one at bedtime   JANUVIA 50 MG tablet Generic drug:  sitaGLIPtin Take 25 mg by mouth daily.   levothyroxine 175 MCG tablet Commonly known as:  SYNTHROID, LEVOTHROID Take 150 mcg by mouth daily before breakfast.   omeprazole 20 MG capsule Commonly known as:  PRILOSEC Take 20 mg by mouth 2 (two) times daily.   oseltamivir 75 MG capsule Commonly known as:  TAMIFLU   tamsulosin 0.4 MG Caps capsule Commonly known as:  FLOMAX Take 0.4 mg by mouth daily after supper.   triamcinolone ointment 0.1 % Commonly known as:  KENALOG Apply 1 application topically 2 (two) times  daily as needed (for irritation).       Allergies:  Allergies  Allergen Reactions  . Iohexol Other (See Comments)    Pt states that he was told by MD not to ever have contrast again.    . Allopurinol Rash    Past Medical History, Surgical history, Social history, and Family History were reviewed and updated.  Review of Systems: Review of Systems  All other systems reviewed and are negative. Marland Kitchen   Physical Exam:  weight is 169 lb (76.7 kg). His oral temperature is 97.8 F (36.6 C). His blood pressure is 145/55 (abnormal) and his pulse is 70. His respiration is 19 and oxygen saturation is 100%.   Wt Readings from Last 3 Encounters:  06/22/17 169 lb (76.7 kg)  06/01/17 164 lb 1.9 oz (74.4 kg)  04/20/17 171 lb 12.8  oz (77.9 kg)    Physical Exam  Constitutional: He is oriented to person, place, and time.  HENT:  Head: Normocephalic and atraumatic.  Mouth/Throat: Oropharynx is clear and moist.  Eyes: EOM are normal. Pupils are equal, round, and reactive to light.  Neck: Normal range of motion.  Cardiovascular: Normal rate, regular rhythm and normal heart sounds.  Pulmonary/Chest: Effort normal and breath sounds normal.  Abdominal: Soft. Bowel sounds are normal.  Musculoskeletal: Normal range of motion. He exhibits no edema, tenderness or deformity.  Lymphadenopathy:    He has no cervical adenopathy.  Neurological: He is alert and oriented to person, place, and time.  Skin: Skin is warm and dry. No rash noted. No erythema.  Psychiatric: He has a normal mood and affect. His behavior is normal. Judgment and thought content normal.  Vitals reviewed.    Lab Results  Component Value Date   WBC 9.7 06/22/2017   HGB 9.3 (L) 04/20/2017   HCT 28.9 (L) 06/22/2017   MCV 97.3 06/22/2017   PLT 223 06/22/2017   Lab Results  Component Value Date   FERRITIN 222 06/01/2017   IRON 93 06/01/2017   TIBC 195 (L) 06/01/2017   UIBC 102 06/01/2017   IRONPCTSAT 48 06/01/2017   Lab Results  Component Value Date   RETICCTPCT 0.9 03/29/2011   RBC 2.97 (L) 06/22/2017   RETICCTABS 31.8 03/29/2011   No results found for: KPAFRELGTCHN, LAMBDASER, KAPLAMBRATIO No results found for: Kandis Cocking, [facility] Lab Results  Component Value Date   TOTALPROTELP 6.7 07/14/2011   ALBUMINELP 56.6 07/14/2011   A1GS 5.7 (H) 07/14/2011   A2GS 12.7 (H) 07/14/2011   BETS 4.9 07/14/2011   BETA2SER 3.8 07/14/2011   GAMS 16.3 07/14/2011   MSPIKE NOT DET 07/14/2011   SPEI * 07/14/2011     Chemistry      Component Value Date/Time   NA 141 06/22/2017 1020   NA 140 04/20/2017 1259   NA 142 02/15/2016 0953   K 4.8 (H) 06/22/2017 1020   K 4.6 04/20/2017 1259   K 4.4 02/15/2016 0953   CL 111 (H) 06/22/2017 1020   CL 106  04/20/2017 1259   CO2 24 06/22/2017 1020   CO2 26 04/20/2017 1259   CO2 23 02/15/2016 0953   BUN 62 (H) 06/22/2017 1020   BUN 43 (H) 04/20/2017 1259   BUN 47.4 (H) 02/15/2016 0953   CREATININE 3.30 (HH) 06/22/2017 1020   CREATININE 3.0 (H) 04/20/2017 1259   CREATININE 2.6 (H) 02/15/2016 0953      Component Value Date/Time   CALCIUM 9.0 06/22/2017 1020   CALCIUM 8.8 04/20/2017 1259  CALCIUM 9.1 02/15/2016 0953   ALKPHOS 115 (H) 06/22/2017 1020   ALKPHOS 103 (H) 04/20/2017 1259   ALKPHOS 99 02/15/2016 0953   AST 20 06/22/2017 1020   AST 16 02/15/2016 0953   ALT 16 06/22/2017 1020   ALT 16 04/20/2017 1259   ALT 7 02/15/2016 0953   BILITOT 0.6 06/22/2017 1020   BILITOT 0.93 02/15/2016 0953      Impression and Plan: Larry [name] is a very pleasant 77 yo caucasian gentleman with anemia of chronic renal failure and hypotestosteronemia. He also has history of splenic marginal zone lymphoma s/p splenectomy 2009.   He completed one cycle of Rituxan in April 2010.   He will get Aranesp today.  We will also give him testosterone.  I do think that the testosterone also will help his anemia.  I would like to see him back in 2 months.  We will keep making adjustments with his protocol depending on his labs.  Again, his quality of life and mental state is somewhat better with testosterone.  He is much more functional and he is much more able to deal with stressful situations.  This helps his wife out tremendously.  Volanda Napoleon, MD 2/22/201911:33 AM

## 2017-06-23 LAB — TESTOSTERONE: TESTOSTERONE: 265 ng/dL (ref 264–916)

## 2017-07-05 DIAGNOSIS — R0989 Other specified symptoms and signs involving the circulatory and respiratory systems: Secondary | ICD-10-CM | POA: Diagnosis not present

## 2017-07-07 ENCOUNTER — Encounter (HOSPITAL_COMMUNITY): Payer: Self-pay | Admitting: Emergency Medicine

## 2017-07-07 ENCOUNTER — Observation Stay (HOSPITAL_BASED_OUTPATIENT_CLINIC_OR_DEPARTMENT_OTHER): Payer: Medicare Other

## 2017-07-07 ENCOUNTER — Inpatient Hospital Stay (HOSPITAL_COMMUNITY)
Admission: EM | Admit: 2017-07-07 | Discharge: 2017-07-10 | DRG: 280 | Disposition: A | Discharge status: Home / Self Care | Payer: Medicare Other | Attending: Internal Medicine | Admitting: Internal Medicine

## 2017-07-07 ENCOUNTER — Other Ambulatory Visit: Payer: Self-pay

## 2017-07-07 ENCOUNTER — Emergency Department (HOSPITAL_COMMUNITY): Payer: Medicare Other

## 2017-07-07 DIAGNOSIS — I509 Heart failure, unspecified: Secondary | ICD-10-CM

## 2017-07-07 DIAGNOSIS — Z201 Contact with and (suspected) exposure to tuberculosis: Secondary | ICD-10-CM | POA: Diagnosis present

## 2017-07-07 DIAGNOSIS — I5031 Acute diastolic (congestive) heart failure: Secondary | ICD-10-CM | POA: Diagnosis not present

## 2017-07-07 DIAGNOSIS — W1830XA Fall on same level, unspecified, initial encounter: Secondary | ICD-10-CM | POA: Diagnosis present

## 2017-07-07 DIAGNOSIS — E1165 Type 2 diabetes mellitus with hyperglycemia: Secondary | ICD-10-CM | POA: Diagnosis present

## 2017-07-07 DIAGNOSIS — Z7952 Long term (current) use of systemic steroids: Secondary | ICD-10-CM

## 2017-07-07 DIAGNOSIS — I214 Non-ST elevation (NSTEMI) myocardial infarction: Secondary | ICD-10-CM | POA: Diagnosis not present

## 2017-07-07 DIAGNOSIS — E039 Hypothyroidism, unspecified: Secondary | ICD-10-CM | POA: Diagnosis present

## 2017-07-07 DIAGNOSIS — E119 Type 2 diabetes mellitus without complications: Secondary | ICD-10-CM

## 2017-07-07 DIAGNOSIS — E1122 Type 2 diabetes mellitus with diabetic chronic kidney disease: Secondary | ICD-10-CM | POA: Diagnosis not present

## 2017-07-07 DIAGNOSIS — Z87442 Personal history of urinary calculi: Secondary | ICD-10-CM

## 2017-07-07 DIAGNOSIS — R0602 Shortness of breath: Secondary | ICD-10-CM | POA: Diagnosis not present

## 2017-07-07 DIAGNOSIS — E785 Hyperlipidemia, unspecified: Secondary | ICD-10-CM | POA: Diagnosis present

## 2017-07-07 DIAGNOSIS — N183 Chronic kidney disease, stage 3 (moderate): Secondary | ICD-10-CM | POA: Diagnosis not present

## 2017-07-07 DIAGNOSIS — Z91041 Radiographic dye allergy status: Secondary | ICD-10-CM

## 2017-07-07 DIAGNOSIS — K219 Gastro-esophageal reflux disease without esophagitis: Secondary | ICD-10-CM | POA: Diagnosis present

## 2017-07-07 DIAGNOSIS — M109 Gout, unspecified: Secondary | ICD-10-CM | POA: Diagnosis present

## 2017-07-07 DIAGNOSIS — I132 Hypertensive heart and chronic kidney disease with heart failure and with stage 5 chronic kidney disease, or end stage renal disease: Secondary | ICD-10-CM | POA: Diagnosis not present

## 2017-07-07 DIAGNOSIS — N184 Chronic kidney disease, stage 4 (severe): Secondary | ICD-10-CM | POA: Diagnosis not present

## 2017-07-07 DIAGNOSIS — R0902 Hypoxemia: Secondary | ICD-10-CM | POA: Diagnosis not present

## 2017-07-07 DIAGNOSIS — I878 Other specified disorders of veins: Secondary | ICD-10-CM | POA: Diagnosis present

## 2017-07-07 DIAGNOSIS — Z7982 Long term (current) use of aspirin: Secondary | ICD-10-CM

## 2017-07-07 DIAGNOSIS — I110 Hypertensive heart disease with heart failure: Secondary | ICD-10-CM | POA: Diagnosis not present

## 2017-07-07 DIAGNOSIS — Z888 Allergy status to other drugs, medicaments and biological substances status: Secondary | ICD-10-CM

## 2017-07-07 DIAGNOSIS — I5043 Acute on chronic combined systolic (congestive) and diastolic (congestive) heart failure: Secondary | ICD-10-CM | POA: Diagnosis present

## 2017-07-07 DIAGNOSIS — Z9081 Acquired absence of spleen: Secondary | ICD-10-CM

## 2017-07-07 DIAGNOSIS — D631 Anemia in chronic kidney disease: Secondary | ICD-10-CM | POA: Diagnosis present

## 2017-07-07 DIAGNOSIS — N400 Enlarged prostate without lower urinary tract symptoms: Secondary | ICD-10-CM | POA: Diagnosis present

## 2017-07-07 DIAGNOSIS — Z79890 Hormone replacement therapy (postmenopausal): Secondary | ICD-10-CM

## 2017-07-07 DIAGNOSIS — I340 Nonrheumatic mitral (valve) insufficiency: Secondary | ICD-10-CM

## 2017-07-07 DIAGNOSIS — S2232XA Fracture of one rib, left side, initial encounter for closed fracture: Secondary | ICD-10-CM | POA: Diagnosis present

## 2017-07-07 DIAGNOSIS — I6521 Occlusion and stenosis of right carotid artery: Secondary | ICD-10-CM | POA: Diagnosis present

## 2017-07-07 DIAGNOSIS — I10 Essential (primary) hypertension: Secondary | ICD-10-CM | POA: Diagnosis present

## 2017-07-07 DIAGNOSIS — I5041 Acute combined systolic (congestive) and diastolic (congestive) heart failure: Secondary | ICD-10-CM | POA: Diagnosis not present

## 2017-07-07 DIAGNOSIS — R011 Cardiac murmur, unspecified: Secondary | ICD-10-CM | POA: Diagnosis present

## 2017-07-07 DIAGNOSIS — Z7984 Long term (current) use of oral hypoglycemic drugs: Secondary | ICD-10-CM

## 2017-07-07 DIAGNOSIS — N185 Chronic kidney disease, stage 5: Secondary | ICD-10-CM | POA: Diagnosis present

## 2017-07-07 DIAGNOSIS — I2729 Other secondary pulmonary hypertension: Secondary | ICD-10-CM | POA: Diagnosis present

## 2017-07-07 DIAGNOSIS — Z8572 Personal history of non-Hodgkin lymphomas: Secondary | ICD-10-CM

## 2017-07-07 DIAGNOSIS — Z79899 Other long term (current) drug therapy: Secondary | ICD-10-CM

## 2017-07-07 LAB — ECHOCARDIOGRAM COMPLETE
Height: 66 in
Weight: 2745.6 oz

## 2017-07-07 LAB — CBC WITH DIFFERENTIAL/PLATELET
Basophils Absolute: 0 10*3/uL (ref 0.0–0.1)
Basophils Relative: 0 %
Eosinophils Absolute: 1.9 10*3/uL — ABNORMAL HIGH (ref 0.0–0.7)
Eosinophils Relative: 13 %
HEMATOCRIT: 30.1 % — AB (ref 39.0–52.0)
HEMOGLOBIN: 10 g/dL — AB (ref 13.0–17.0)
LYMPHS ABS: 1.8 10*3/uL (ref 0.7–4.0)
Lymphocytes Relative: 13 %
MCH: 33.1 pg (ref 26.0–34.0)
MCHC: 33.2 g/dL (ref 30.0–36.0)
MCV: 99.7 fL (ref 78.0–100.0)
MONO ABS: 2.6 10*3/uL — AB (ref 0.1–1.0)
MONOS PCT: 18 %
NEUTROS ABS: 8 10*3/uL — AB (ref 1.7–7.7)
NEUTROS PCT: 56 %
Platelets: 237 10*3/uL (ref 150–400)
RBC: 3.02 MIL/uL — ABNORMAL LOW (ref 4.22–5.81)
RDW: 17.9 % — AB (ref 11.5–15.5)
WBC: 14.4 10*3/uL — ABNORMAL HIGH (ref 4.0–10.5)

## 2017-07-07 LAB — GLUCOSE, CAPILLARY
GLUCOSE-CAPILLARY: 129 mg/dL — AB (ref 65–99)
GLUCOSE-CAPILLARY: 98 mg/dL (ref 65–99)
Glucose-Capillary: 129 mg/dL — ABNORMAL HIGH (ref 65–99)
Glucose-Capillary: 118 mg/dL — ABNORMAL HIGH (ref 65–99)
Glucose-Capillary: 132 mg/dL — ABNORMAL HIGH (ref 65–99)

## 2017-07-07 LAB — CBC
HCT: 30 % — ABNORMAL LOW (ref 39.0–52.0)
Hemoglobin: 9.6 g/dL — ABNORMAL LOW (ref 13.0–17.0)
MCH: 32.3 pg (ref 26.0–34.0)
MCHC: 32 g/dL (ref 30.0–36.0)
MCV: 101 fL — AB (ref 78.0–100.0)
PLATELETS: 211 10*3/uL (ref 150–400)
RBC: 2.97 MIL/uL — AB (ref 4.22–5.81)
RDW: 17.8 % — AB (ref 11.5–15.5)
WBC: 14.7 10*3/uL — AB (ref 4.0–10.5)

## 2017-07-07 LAB — BASIC METABOLIC PANEL
Anion gap: 13 (ref 5–15)
Anion gap: 11 (ref 5–15)
BUN: 49 mg/dL — AB (ref 6–20)
BUN: 49 mg/dL — ABNORMAL HIGH (ref 6–20)
CHLORIDE: 110 mmol/L (ref 101–111)
CHLORIDE: 110 mmol/L (ref 101–111)
CO2: 19 mmol/L — AB (ref 22–32)
CO2: 20 mmol/L — ABNORMAL LOW (ref 22–32)
CREATININE: 3.49 mg/dL — AB (ref 0.61–1.24)
CREATININE: 3.4 mg/dL — AB (ref 0.61–1.24)
Calcium: 8.2 mg/dL — ABNORMAL LOW (ref 8.9–10.3)
Calcium: 8.3 mg/dL — ABNORMAL LOW (ref 8.9–10.3)
GFR calc Af Amer: 18 mL/min — ABNORMAL LOW (ref >60)
GFR calc non Af Amer: 16 mL/min — ABNORMAL LOW (ref >60)
GFR, EST AFRICAN AMERICAN: 19 mL/min — AB (ref >60)
GFR, EST NON AFRICAN AMERICAN: 16 mL/min — AB (ref >60)
GLUCOSE: 172 mg/dL — AB (ref 65–99)
Glucose, Bld: 147 mg/dL — ABNORMAL HIGH (ref 65–99)
POTASSIUM: 4.6 mmol/L (ref 3.5–5.1)
Potassium: 4.6 mmol/L (ref 3.5–5.1)
SODIUM: 141 mmol/L (ref 135–145)
Sodium: 142 mmol/L (ref 135–145)

## 2017-07-07 LAB — LIPID PANEL
CHOL/HDL RATIO: 2.9 ratio
CHOLESTEROL: 98 mg/dL (ref 0–200)
HDL: 34 mg/dL — AB (ref >40)
LDL Cholesterol: 54 mg/dL (ref 0–99)
TRIGLYCERIDES: 49 mg/dL (ref <150)
VLDL: 10 mg/dL (ref 0–40)

## 2017-07-07 LAB — TROPONIN I
TROPONIN I: 1.12 ng/mL — AB (ref <0.03)
Troponin I: 0.29 ng/mL (ref <0.03)
Troponin I: 1.71 ng/mL (ref <0.03)

## 2017-07-07 LAB — I-STAT TROPONIN, ED: Troponin i, poc: 0.27 ng/mL (ref 0.00–0.08)

## 2017-07-07 LAB — BRAIN NATRIURETIC PEPTIDE: B Natriuretic Peptide: 1185.9 pg/mL — ABNORMAL HIGH (ref 0.0–100.0)

## 2017-07-07 MED ORDER — FINASTERIDE 5 MG PO TABS
5.0000 mg | ORAL_TABLET | Freq: Every day | ORAL | Status: DC
  Administered 2017-07-07 – 2017-07-10 (×4): 5 mg via ORAL
  Filled 2017-07-07 (×4): qty 1

## 2017-07-07 MED ORDER — PANTOPRAZOLE SODIUM 40 MG PO TBEC
40.0000 mg | DELAYED_RELEASE_TABLET | Freq: Every day | ORAL | Status: DC
  Administered 2017-07-08 – 2017-07-10 (×3): 40 mg via ORAL
  Filled 2017-07-07 (×3): qty 1

## 2017-07-07 MED ORDER — ACETAMINOPHEN 325 MG PO TABS: 650.0000 mg | ORAL_TABLET | ORAL | Status: DC | PRN

## 2017-07-07 MED ORDER — SODIUM CHLORIDE 0.9 % IV SOLN: 250.0000 mL | INTRAVENOUS | Status: DC | PRN

## 2017-07-07 MED ORDER — TICAGRELOR 90 MG PO TABS
90.0000 mg | ORAL_TABLET | Freq: Two times a day (BID) | ORAL | Status: DC
  Administered 2017-07-07 – 2017-07-10 (×6): 90 mg via ORAL
  Filled 2017-07-07 (×6): qty 1

## 2017-07-07 MED ORDER — DOXAZOSIN MESYLATE 8 MG PO TABS
8.0000 mg | ORAL_TABLET | Freq: Every day | ORAL | Status: DC
  Administered 2017-07-07 – 2017-07-10 (×4): 8 mg via ORAL
  Filled 2017-07-07 (×4): qty 1

## 2017-07-07 MED ORDER — ATORVASTATIN CALCIUM 10 MG PO TABS: 10.0000 mg | ORAL_TABLET | Freq: Every day | ORAL | Status: DC

## 2017-07-07 MED ORDER — SODIUM CHLORIDE 0.9% FLUSH: 3.0000 mL | INTRAVENOUS | Status: DC | PRN

## 2017-07-07 MED ORDER — CARVEDILOL 3.125 MG PO TABS
3.1250 mg | ORAL_TABLET | Freq: Two times a day (BID) | ORAL | Status: DC
  Administered 2017-07-07 – 2017-07-09 (×5): 3.125 mg via ORAL
  Filled 2017-07-07 (×5): qty 1

## 2017-07-07 MED ORDER — PANTOPRAZOLE SODIUM 40 MG PO TBEC
40.0000 mg | DELAYED_RELEASE_TABLET | Freq: Two times a day (BID) | ORAL | Status: DC
  Administered 2017-07-07: 40 mg via ORAL
  Filled 2017-07-07: qty 1

## 2017-07-07 MED ORDER — FUROSEMIDE 10 MG/ML IJ SOLN
40.0000 mg | INTRAMUSCULAR | Status: AC
  Administered 2017-07-07: 40 mg via INTRAVENOUS
  Filled 2017-07-07: qty 4

## 2017-07-07 MED ORDER — HEPARIN (PORCINE) IN NACL 100-0.45 UNIT/ML-% IJ SOLN
1450.0000 [IU]/h | INTRAMUSCULAR | Status: DC
  Administered 2017-07-07: 1000 [IU]/h via INTRAVENOUS
  Administered 2017-07-09: 1450 [IU]/h via INTRAVENOUS
  Filled 2017-07-07 (×3): qty 250

## 2017-07-07 MED ORDER — FUROSEMIDE 10 MG/ML IJ SOLN: 40.0000 mg | Freq: Two times a day (BID) | INTRAMUSCULAR | Status: DC

## 2017-07-07 MED ORDER — FUROSEMIDE 10 MG/ML IJ SOLN
80.0000 mg | Freq: Two times a day (BID) | INTRAMUSCULAR | Status: DC
  Administered 2017-07-07 – 2017-07-09 (×5): 80 mg via INTRAVENOUS
  Filled 2017-07-07 (×5): qty 8

## 2017-07-07 MED ORDER — TAMSULOSIN HCL 0.4 MG PO CAPS
0.4000 mg | ORAL_CAPSULE | Freq: Every day | ORAL | Status: DC
  Administered 2017-07-07 – 2017-07-09 (×3): 0.4 mg via ORAL
  Filled 2017-07-07 (×3): qty 1

## 2017-07-07 MED ORDER — ONDANSETRON HCL 4 MG/2ML IJ SOLN: 4.0000 mg | Freq: Four times a day (QID) | INTRAMUSCULAR | Status: DC | PRN

## 2017-07-07 MED ORDER — TRIAMCINOLONE ACETONIDE 0.1 % EX CREA
TOPICAL_CREAM | Freq: Two times a day (BID) | CUTANEOUS | Status: DC
  Administered 2017-07-07 – 2017-07-10 (×5): via TOPICAL
  Filled 2017-07-07: qty 15

## 2017-07-07 MED ORDER — INSULIN ASPART 100 UNIT/ML ~~LOC~~ SOLN: 0.0000 [IU] | SUBCUTANEOUS | Status: DC

## 2017-07-07 MED ORDER — AMLODIPINE BESYLATE 5 MG PO TABS
5.0000 mg | ORAL_TABLET | Freq: Every day | ORAL | Status: DC
  Administered 2017-07-07 – 2017-07-10 (×4): 5 mg via ORAL
  Filled 2017-07-07 (×4): qty 1

## 2017-07-07 MED ORDER — LEVOTHYROXINE SODIUM 125 MCG PO TABS
125.0000 ug | ORAL_TABLET | Freq: Every day | ORAL | Status: DC
  Administered 2017-07-07 – 2017-07-10 (×4): 125 ug via ORAL
  Filled 2017-07-07 (×4): qty 1

## 2017-07-07 MED ORDER — FUROSEMIDE 10 MG/ML IJ SOLN: 40.0000 mg | Freq: Once | INTRAMUSCULAR | Status: DC

## 2017-07-07 MED ORDER — ASPIRIN EC 81 MG PO TBEC
81.0000 mg | DELAYED_RELEASE_TABLET | Freq: Every day | ORAL | Status: DC
  Administered 2017-07-07 – 2017-07-10 (×4): 81 mg via ORAL
  Filled 2017-07-07 (×4): qty 1

## 2017-07-07 MED ORDER — ISOSORB DINITRATE-HYDRALAZINE 20-37.5 MG PO TABS
1.0000 | ORAL_TABLET | Freq: Three times a day (TID) | ORAL | Status: DC
  Administered 2017-07-07 – 2017-07-09 (×6): 1 via ORAL
  Filled 2017-07-07 (×6): qty 1

## 2017-07-07 MED ORDER — FUROSEMIDE 10 MG/ML IJ SOLN: 80.0000 mg | Freq: Once | INTRAMUSCULAR | Status: DC

## 2017-07-07 MED ORDER — ESCITALOPRAM OXALATE 10 MG PO TABS
10.0000 mg | ORAL_TABLET | Freq: Every day | ORAL | Status: DC
  Administered 2017-07-07 – 2017-07-10 (×4): 10 mg via ORAL
  Filled 2017-07-07 (×4): qty 1

## 2017-07-07 MED ORDER — INSULIN ASPART 100 UNIT/ML ~~LOC~~ SOLN
0.0000 [IU] | Freq: Three times a day (TID) | SUBCUTANEOUS | Status: DC
  Administered 2017-07-08 – 2017-07-09 (×2): 2 [IU] via SUBCUTANEOUS
  Administered 2017-07-10: 1 [IU] via SUBCUTANEOUS

## 2017-07-07 MED ORDER — ATORVASTATIN CALCIUM 80 MG PO TABS
80.0000 mg | ORAL_TABLET | Freq: Every day | ORAL | Status: DC
  Administered 2017-07-07 – 2017-07-09 (×3): 80 mg via ORAL
  Filled 2017-07-07 (×3): qty 1

## 2017-07-07 MED ORDER — HEPARIN SODIUM (PORCINE) 5000 UNIT/ML IJ SOLN
5000.0000 [IU] | Freq: Three times a day (TID) | INTRAMUSCULAR | Status: DC
  Administered 2017-07-07 (×2): 5000 [IU] via SUBCUTANEOUS
  Filled 2017-07-07 (×2): qty 1

## 2017-07-07 MED ORDER — SODIUM CHLORIDE 0.9% FLUSH
3.0000 mL | Freq: Two times a day (BID) | INTRAVENOUS | Status: DC
  Administered 2017-07-07 – 2017-07-10 (×7): 3 mL via INTRAVENOUS

## 2017-07-07 MED ORDER — FUROSEMIDE 10 MG/ML IJ SOLN: 80.0000 mg | Freq: Two times a day (BID) | INTRAMUSCULAR | Status: DC

## 2017-07-07 NOTE — Progress Notes (Signed)
   07/07/17 0427  Vitals  Temp 97.7 F (36.5 C)  Temp Source Oral  BP (!) 154/78  BP Location Right Arm  Pulse Rate (!) 108  Pulse Rate Source Dinamap  Resp 18  Oxygen Therapy  SpO2 92 %  O2 Device Nasal Cannula  O2 Flow Rate (L/min) 2 L/min  Height and Weight  Height 5\' 6"  (1.676 m)  Weight 77.8 kg (171 lb 9.6 oz) (scale a)  Type of Scale Used Standing  BSA (Calculated - sq m) 1.9 sq meters  BMI (Calculated) 27.71  Weight in (lb) to have BMI = 25 154.6  Admitted pt to rm 3E02 from ED, pt alert and oriented, denied pain at this time, oriented to room, call bell placed within reach, placed on cardiac monitor, CCMD made aware. Pt has a chronic foley placed by urologist and being changed every month.

## 2017-07-07 NOTE — Progress Notes (Signed)
Notified Dr. Wynelle Cleveland that pt's troponin went up to 1.12.

## 2017-07-07 NOTE — ED Notes (Signed)
Patient transported to X-ray 

## 2017-07-07 NOTE — Progress Notes (Addendum)
PROGRESS NOTE    ROGEN PORTE   QMV:[id number]  DOB: 15-Nov-1940  DOA: 07/07/2017 PCP: Gaynelle Arabian, MD   Brief Narrative:  Larry [name] is a 77 y.o. male with medical history significant of CKD stage 5, CHF, DM2, non-Hodgkins lymphoma in remission, BPH with chronic foley.  Patient is on lasix 40mg  BID at baseline according to him.   He has noticed increasing shortness of breath over the past week and became suddenly short of breath yesterday. He has been taking his Lasix but was recently drinking more water and has noticed about a 4 lb increase in weight.   Subjective: Feels much less short of breath today as compared to when he was admitted.  ROS: no complaints of nausea, vomiting, constipation diarrhea, cough, dyspnea or dysuria. No other complaints.   Assessment & Plan:   Principal Problem:   Acute diastolic CHF (congestive heart failure)  - cont to diurese with IVF lasix - he feels much better - ECHO shows EF of 35-40% and grade 2 d CHF, mod-severe pulm HTN, mod MR- prior EF 50-55% with grade 2 d CHF  Active Problems: Elevated troponin - troponin has risen form 0.29 to 1.12- he has not had chest pain but acute onset dyspnea yesterday could have been an anginal equivalent  Displaced Left 7th rib fracture  - noted incidentally on CXR - he admits to a mechanical fall a couple of weeks ago - he states that a CXR at that time did not show any fractures- he currently has no discomfort from his fracture.    Diabetes mellitus 2 - on SSI- takes Januvia at home    Essential hypertension - cont Norvasc, Coreg and Doxazosin    Chronic kidney disease, stage 5  - follows as outpt with Dr Florene Glen  BPH with chronic foley - cont Proscar and Flomax- also on Doxzosin   DVT prophylaxis: Heparin Code Status: Full code Family Communication: wife at bedside Disposition Plan: home when stable Consultants:   Cardiology  Procedures:  2 D ECHO Study Conclusions  - Left  ventricle: The cavity size was normal. Wall thickness was   normal. Systolic function was moderately reduced. The estimated   ejection fraction was in the range of 35% to 40%. Akinesis of the   mid-apicalanteroseptal myocardium. Features are consistent with a   pseudonormal left ventricular filling pattern, with concomitant   abnormal relaxation and increased filling pressure (grade 2   diastolic dysfunction). - Aortic valve: There was mild stenosis. Valve area (VTI): 1.52   cm^2. Valve area (Vmax): 1.33 cm^2. Valve area (Vmean): 1.34   cm^2. - Mitral valve: There was moderate regurgitation. - Left atrium: The atrium was mildly dilated. - Right atrium: The atrium was mildly dilated. - Pulmonary arteries: Systolic pressure was moderately to severely   increased. PA peak pressure: 62 mm Hg (S). - Pericardium, extracardiac: A trivial pericardial effusion was   identified. Antimicrobials:  Anti-infectives (From admission, onward)   None       Objective: Vitals:   07/07/17 0315 07/07/17 0427 07/07/17 0839 07/07/17 1239  BP: (!) 146/82 (!) 154/78 (!) 155/73 (!) 177/58  Pulse: (!) 101 (!) 108 (!) 101 96  Resp: (!) 26 18  18   Temp:  97.7 F (36.5 C)  97.9 F (36.6 C)  TempSrc:  Oral  Oral  SpO2: 94% 92% 96% 94%  Weight:  77.8 kg (171 lb 9.6 oz)    Height:  5\' 6"  (1.676 m)  Intake/Output Summary (Last 24 hours) at 07/07/2017 1540 Last data filed at 07/07/2017 1018 Gross per 24 hour  Intake 3 ml  Output 800 ml  Net -797 ml   Filed Weights   07/07/17 0129 07/07/17 0427  Weight: 75.8 kg (167 lb) 77.8 kg (171 lb 9.6 oz)    Examination: General exam: Appears comfortable  HEENT: PERRLA, oral mucosa moist, no sclera icterus or thrush Respiratory system: decreased breath sounds at right base- no crackles- Respiratory effort normal. Cardiovascular system: S1 & S2 heard, RRR.  No murmurs  Gastrointestinal system: Abdomen soft, non-tender, nondistended. Normal bowel sound. No  organomegaly Central nervous system: Alert and oriented. No focal neurological deficits. Extremities: No cyanosis, clubbing or edema Skin: No rashes or ulcers Psychiatry:  Mood & affect appropriate.     Data Reviewed: I have personally reviewed following labs and imaging studies  CBC: Recent Labs  Lab 07/07/17 0139 07/07/17 0818  WBC 14.4* 14.7*  NEUTROABS 8.0*  --   HGB 10.0* 9.6*  HCT 30.1* 30.0*  MCV 99.7 101.0*  PLT 237 195   Basic Metabolic Panel: Recent Labs  Lab 07/07/17 0139 07/07/17 0818  NA 142 141  K 4.6 4.6  CL 110 110  CO2 19* 20*  GLUCOSE 172* 147*  BUN 49* 49*  CREATININE 3.49* 3.40*  CALCIUM 8.2* 8.3*   GFR: Estimated Creatinine Clearance: 17.9 mL/min (A) (by C-G formula based on SCr of 3.4 mg/dL (H)). Liver Function Tests: No results for input(s): AST, ALT, ALKPHOS, BILITOT, PROT, ALBUMIN in the last 168 hours. No results for input(s): LIPASE, AMYLASE in the last 168 hours. No results for input(s): AMMONIA in the last 168 hours. Coagulation Profile: No results for input(s): INR, PROTIME in the last 168 hours. Cardiac Enzymes: Recent Labs  Lab 07/07/17 0530 07/07/17 1405  TROPONINI 0.29* 1.12*   BNP (last 3 results) No results for input(s): PROBNP in the last 8760 hours. HbA1C: No results for input(s): HGBA1C in the last 72 hours. CBG: Recent Labs  Lab 07/07/17 0430 07/07/17 0729 07/07/17 1141  GLUCAP 129* 98 129*   Lipid Profile: No results for input(s): CHOL, HDL, LDLCALC, TRIG, CHOLHDL, LDLDIRECT in the last 72 hours. Thyroid Function Tests: No results for input(s): TSH, T4TOTAL, FREET4, T3FREE, THYROIDAB in the last 72 hours. Anemia Panel: No results for input(s): VITAMINB12, FOLATE, FERRITIN, TIBC, IRON, RETICCTPCT in the last 72 hours. Urine analysis:    Component Value Date/Time   COLORURINE YELLOW 03/07/2016 1535   APPEARANCEUR CLEAR 03/07/2016 1535   LABSPEC 1.015 03/07/2016 1535   PHURINE 6.0 03/07/2016 1535    GLUCOSEU NEGATIVE 03/07/2016 1535   HGBUR SMALL (A) 03/07/2016 1535   BILIRUBINUR NEGATIVE 03/07/2016 1535   KETONESUR NEGATIVE 03/07/2016 1535   PROTEINUR >300 (A) 03/07/2016 1535   UROBILINOGEN 0.2 08/11/2011 1050   NITRITE NEGATIVE 03/07/2016 1535   LEUKOCYTESUR NEGATIVE 03/07/2016 1535   Sepsis Labs: @LABRCNTIP (procalcitonin:4,lacticidven:4) )No results found for this or any previous visit (from the past 240 hour(s)).       Radiology Studies: Dg Chest 2 View  Result Date: 07/07/2017 CLINICAL DATA:  Acute onset of shortness of breath. EXAM: CHEST - 2 VIEW COMPARISON:  Chest radiograph performed 05/23/2017 FINDINGS: There is a mildly displaced fracture of the left lateral seventh rib. Mild vascular congestion is noted. Peribronchial thickening is noted. A small left pleural effusion is noted. The heart is normal in size. No acute osseous abnormalities are identified. IMPRESSION: 1. Mildly displaced fracture of the left lateral seventh  rib. 2. Mild vascular congestion and small left pleural effusion. Peribronchial thickening noted. This may reflect transient interstitial edema given left-sided rib fracture. Electronically Signed   By: Garald Balding M.D.   On: 07/07/2017 02:20      Scheduled Meds: . amLODipine  5 mg Oral Daily  . aspirin EC  81 mg Oral Daily  . atorvastatin  10 mg Oral QHS  . carvedilol  3.125 mg Oral BID WC  . doxazosin  8 mg Oral Daily  . escitalopram  10 mg Oral Daily  . finasteride  5 mg Oral Daily  . furosemide  80 mg Intravenous BID  . heparin  5,000 Units Subcutaneous Q8H  . insulin aspart  0-9 Units Subcutaneous Q4H  . levothyroxine  125 mcg Oral QAC breakfast  . pantoprazole  40 mg Oral BID  . sodium chloride flush  3 mL Intravenous Q12H  . tamsulosin  0.4 mg Oral QPC supper  . triamcinolone cream   Topical BID   Continuous Infusions: . sodium chloride       LOS: 0 days    Time spent in minutes: 35    Debbe Odea, MD [facility] Pager: [email or web address] Password [email or web address] 07/07/2017, 3:40 PM

## 2017-07-07 NOTE — H&P (Signed)
History and Physical    Larry [name]:[id number] DOB: Sep 07, 1940 DOA: 07/07/2017  PCP: Gaynelle Arabian, MD  Patient coming from: Home  I have personally briefly reviewed patient's old medical records in [place]  Chief Complaint: SOB  HPI: Larry [name] is a 77 y.o. male with medical history significant of CKD stage 5, CHF, DM2, BPH with chronic foley.  Patient is on lasix 40mg  BID at baseline according to him.  He presents to the ED with c/o sudden onset SOB, onset 20 min PTA.  States leg have been more swollen than normal lately but actually imrpoved a little at present.  He does report symptoms are worse when trying to lay flat to sleep.  He is followed by cardiology, Dr. Einar Gip.  Dr. Florene Glen is his nephrologist.  EF 55-60% on Echo 03/2016.  No cough, fever, hemoptysis.   ED Course: Patient in acute CHF with trop 0.27, BNP 1.1k.  pulm edema on CXR.  Peripheral edema.  Given 40 lasix IV x1.   Review of Systems: As per HPI otherwise 10 point review of systems negative.   Past Medical History:  Diagnosis Date  . Anemia of chronic renal failure, stage 4 (severe) ([place]) 09/06/2016  . Chronic kidney disease    "I see [name]" (03/07/2016)  . Chronic kidney disease, stage 5 ([place])   . 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 GERD (gastroesophageal reflux disease)   . Gout   . H/O exposure to tuberculosis   . History of kidney stones    "they passed" (03/07/2016)  . Hyperkalemia   . Hypertension   . Hypotestosteronism 03/29/2011  . Hypothyroidism   . Non Hodgkin's lymphoma ([place])   . 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 Thyroid disease   . Type II diabetes mellitus ([place]) dx'd ~ 2005    Past Surgical History:  Procedure Laterality Date  . APPENDECTOMY  1948  . INGUINAL HERNIA REPAIR Right ~ 1948  . LAPAROTOMY  08/11/2011   Procedure: EXPLORATORY LAPAROTOMY;  Surgeon: Zenovia Jarred, MD;  Location: [facility];  Service: General;  Laterality:  N/A;  . PANCREATECTOMY  2009   Partial w/ splenectomy  . perforation of duodenum repair     2013  . SPLENECTOMY, TOTAL  2009  . TONSILLECTOMY       reports that  has never smoked. he has never used smokeless tobacco. He reports that he does not drink alcohol or use drugs.  Allergies  Allergen Reactions  . Iohexol Other (See Comments)    Pt states that he was told by MD not to ever have contrast again.    . Allopurinol Rash    Family History  Problem Relation Age of Onset  . Tuberculosis Father      Prior to Admission medications   Medication Sig Start Date End Date Taking? Authorizing Provider  amLODipine (NORVASC) 5 MG tablet Take 5 mg by mouth daily.   Yes [provider]  aspirin EC 81 MG EC tablet Take 1 tablet (81 mg total) by mouth daily. 03/13/16  Yes Tat, Shanon Brow, MD  atorvastatin (LIPITOR) 10 MG tablet Take 10 mg by mouth at bedtime.    Yes [provider]  carvedilol (COREG) 3.125 MG tablet Take 3.125 mg by mouth 2 (two) times daily with a meal.   Yes [provider]  COLCRYS 0.6 MG tablet Take 0.6 mg by mouth daily as needed (Gout).  02/06/17  Yes [provider]  doxazosin (CARDURA) 8 MG tablet Take 8 mg by mouth. 01/17/17  Yes [provider]  escitalopram (LEXAPRO) 10 MG tablet Take 1 tablet (10 mg total) by mouth daily. 12/08/16  Yes Volanda Napoleon, MD  finasteride (PROSCAR) 5 MG tablet Take 5 mg by mouth daily.  12/28/16  Yes [provider]  furosemide (LASIX) 80 MG tablet Take 80 mg by mouth 2 (two) times daily.    Yes [provider]  JANUVIA 50 MG tablet Take 25 mg by mouth daily.  12/08/16  Yes [provider]  levothyroxine (SYNTHROID, LEVOTHROID) 125 MCG tablet Take 125 mcg by mouth daily before breakfast.   Yes [provider]  omeprazole (PRILOSEC) 20 MG capsule Take 20 mg by mouth 2 (two) times daily.    Yes [provider]  tamsulosin (FLOMAX) 0.4 MG CAPS capsule Take  0.4 mg by mouth daily after supper.  12/28/16  Yes [provider]    Physical Exam: Vitals:   07/07/17 0127 07/07/17 0129 07/07/17 0215  BP:  (!) 155/78 (!) 156/78  Pulse:  (!) 107 97  Resp:  18 (!) 24  Temp:  97.9 F (36.6 C)   TempSrc:  Oral   SpO2: 96% 94% 94%  Weight:  75.8 kg (167 lb)   Height:  5\' 8"  (1.727 m)     Constitutional: NAD, calm, comfortable Eyes: PERRL, lids and conjunctivae normal ENMT: Mucous membranes are moist. Posterior pharynx clear of any exudate or lesions.Normal dentition.  Neck: normal, supple, no masses, no thyromegaly Respiratory: Bibasilar crackles and few wheezes. Cardiovascular: Regular rate and rhythm, no murmurs / rubs / gallops. 3-4+ pitting edema BLE. 2+ pedal pulses. No carotid bruits.  Abdomen: no tenderness, no masses palpated. No hepatosplenomegaly. Bowel sounds positive.  Musculoskeletal: no clubbing / cyanosis. No joint deformity upper and lower extremities. Good ROM, no contractures. Normal muscle tone.  Skin: no rashes, lesions, ulcers. No induration Neurologic: CN 2-12 grossly intact. Sensation intact, DTR normal. Strength 5/5 in all 4.  Psychiatric: Normal judgment and insight. Alert and oriented x 3. Normal mood.    Labs on Admission: I have personally reviewed following labs and imaging studies  CBC: Recent Labs  Lab 07/07/17 0139  WBC 14.4*  NEUTROABS 8.0*  HGB 10.0*  HCT 30.1*  MCV 99.7  PLT 902   Basic Metabolic Panel: Recent Labs  Lab 07/07/17 0139  NA 142  K 4.6  CL 110  CO2 19*  GLUCOSE 172*  BUN 49*  CREATININE 3.49*  CALCIUM 8.2*   GFR: Estimated Creatinine Clearance: 17.1 mL/min (A) (by C-G formula based on SCr of 3.49 mg/dL (H)). Liver Function Tests: No results for input(s): AST, ALT, ALKPHOS, BILITOT, PROT, ALBUMIN in the last 168 hours. No results for input(s): LIPASE, AMYLASE in the last 168 hours. No results for input(s): AMMONIA in the last 168 hours. Coagulation Profile: No  results for input(s): INR, PROTIME in the last 168 hours. Cardiac Enzymes: No results for input(s): CKTOTAL, CKMB, CKMBINDEX, TROPONINI in the last 168 hours. BNP (last 3 results) No results for input(s): PROBNP in the last 8760 hours. HbA1C: No results for input(s): HGBA1C in the last 72 hours. CBG: No results for input(s): GLUCAP in the last 168 hours. Lipid Profile: No results for input(s): CHOL, HDL, LDLCALC, TRIG, CHOLHDL, LDLDIRECT in the last 72 hours. Thyroid Function Tests: No results for input(s): TSH, T4TOTAL, FREET4, T3FREE, THYROIDAB in the last 72 hours. Anemia Panel: No results for input(s): VITAMINB12, FOLATE,  FERRITIN, TIBC, IRON, RETICCTPCT in the last 72 hours. Urine analysis:    Component Value Date/Time   COLORURINE YELLOW 03/07/2016 1535   APPEARANCEUR CLEAR 03/07/2016 1535   LABSPEC 1.015 03/07/2016 1535   PHURINE 6.0 03/07/2016 1535   GLUCOSEU NEGATIVE 03/07/2016 1535   HGBUR SMALL (A) 03/07/2016 1535   BILIRUBINUR NEGATIVE 03/07/2016 1535   KETONESUR NEGATIVE 03/07/2016 1535   PROTEINUR >300 (A) 03/07/2016 1535   UROBILINOGEN 0.2 08/11/2011 1050   NITRITE NEGATIVE 03/07/2016 1535   LEUKOCYTESUR NEGATIVE 03/07/2016 1535    Radiological Exams on Admission: Dg Chest 2 View  Result Date: 07/07/2017 CLINICAL DATA:  Acute onset of shortness of breath. EXAM: CHEST - 2 VIEW COMPARISON:  Chest radiograph performed 05/23/2017 FINDINGS: There is a mildly displaced fracture of the left lateral seventh rib. Mild vascular congestion is noted. Peribronchial thickening is noted. A small left pleural effusion is noted. The heart is normal in size. No acute osseous abnormalities are identified. IMPRESSION: 1. Mildly displaced fracture of the left lateral seventh rib. 2. Mild vascular congestion and small left pleural effusion. Peribronchial thickening noted. This may reflect transient interstitial edema given left-sided rib fracture. Electronically Signed   By: Garald Balding  M.D.   On: 07/07/2017 02:20    EKG: Independently reviewed.  Assessment/Plan Principal Problem:   Acute diastolic CHF (congestive heart failure) (HCC) Active Problems:   Diabetes mellitus ([place])   Essential hypertension   Chronic kidney disease, stage 5 ([place])    1. Acute diastolic CHF - 1. CHF pathway 2. Will put on 40 IV lasix BID for now, see how he responds to initial 40mg  dose, but I suspect we may need to increase given high baseline creatinine. 3. Likely will want to engage nephrology to manage lasix dosing, especially with CKD stage 5. 4. 2D echo 5. Serial trops 6. Tele monitor 2. DM2 - 1. Hold Januvia 2. Sensitive SSI AC 3. HTN - continue home meds 4. CKD stage 5 - nephrology consult in AM as above  DVT prophylaxis: Heparin Code Status: Full Family Communication: Wife at bedisde Disposition Plan: Home after admit Consults called: None Admission status: Place in [place], [facility] Pager [phone]  If 7AM-7PM, please contact day team taking care of patient [email or web address] Password [email or web address]  07/07/2017, 3:29 AM

## 2017-07-07 NOTE — Progress Notes (Signed)
CRITICAL VALUE ALERT  Critical Value:  Troponin 0.29  Date & Time Notied:  7:37, 07/07/17  Provider Notified: Dr Wynelle Cleveland  Orders Received/Actions taken:

## 2017-07-07 NOTE — Progress Notes (Signed)
  Echocardiogram 2D Echocardiogram has been performed.  Larry [name] 07/07/2017, 12:55 PM

## 2017-07-07 NOTE — Consult Note (Signed)
Reason for Consult: CHF and NSTEMI Referring Physician: Debbe Odea, MD  Larry [name] is an 77 y.o. male.  HPI: [name] is a pleasant 77 year old Caucasian male with history of stage IV chronic kidney disease, chronic diastolic heart failure, ejection fraction around 55-60% by echocardiogram in November 2017, has had a negative nuclear stress test in November 2017 January 2018 with normal LVEF.  He has asymptomatic right ICA stenosis, controlled diabetes mellitus, GERD, anemia of chronic disease, hypertension.  He had been doing well until last night when he was watching TV, went to bed and suddenly felt markedly dyspneic, EMS was activated and brought to the emergency room where he was found to be in congestive heart failure and was diuresed and admitted to the hospital.  He was started on furosemide and he responded well and his dyspnea has improved.  Due to positive cardiac markers, new onset of LV systolic dysfunction by echocardiogram and wall motion abnormality, I was consulted to see the patient. He is presently feeling much better, dyspnea has improved, but still states that he is unable to lay down as he gets markedly dyspneic.  He has not had any chest pain, palpitations, dizziness or syncope.  Past Medical History:  Diagnosis Date  . Anemia of chronic renal failure, stage 4 (severe) ([place]) 09/06/2016  . Chronic kidney disease    "I see Larry [name]" (03/07/2016)  . Chronic kidney disease, stage 5 ([place])   . 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 GERD (gastroesophageal reflux disease)   . Gout   . H/O exposure to tuberculosis   . History of kidney stones    "they passed" (03/07/2016)  . Hyperkalemia   . Hypertension   . Hypotestosteronism 03/29/2011  . Hypothyroidism   . Non Hodgkin's lymphoma ([place])   . 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 Thyroid disease   . Type II diabetes mellitus ([place]) dx'd ~ 2005    Past Surgical History:  Procedure  Laterality Date  . APPENDECTOMY  1948  . INGUINAL HERNIA REPAIR Right ~ 1948  . LAPAROTOMY  08/11/2011   Procedure: EXPLORATORY LAPAROTOMY;  Surgeon: Zenovia Jarred, MD;  Location: [facility];  Service: General;  Laterality: N/A;  . PANCREATECTOMY  2009   Partial w/ splenectomy  . perforation of duodenum repair     2013  . SPLENECTOMY, TOTAL  2009  . TONSILLECTOMY      Family History  Problem Relation Age of Onset  . Tuberculosis Father     Social History:  reports that  has never smoked. he has never used smokeless tobacco. He reports that he does not drink alcohol or use drugs.  Allergies:  Allergies  Allergen Reactions  . Iohexol Other (See Comments)    Pt states that he was told by MD not to ever have contrast again.    . Allopurinol Rash   CMP Latest Ref Rng & Units 07/07/2017 07/07/2017 06/22/2017  Glucose 65 - 99 mg/dL 147(H) 172(H) 122(H)  BUN 6 - 20 mg/dL 49(H) 49(H) 62(H)  Creatinine 0.61 - 1.24 mg/dL 3.40(H) 3.49(H) 3.30(HH)  Sodium 135 - 145 mmol/L 141 142 141  Potassium 3.5 - 5.1 mmol/L 4.6 4.6 4.8(H)  Chloride 101 - 111 mmol/L 110 110 111(H)  CO2 22 - 32 mmol/L 20(L) 19(L) 24  Calcium 8.9 - 10.3 mg/dL 8.3(L) 8.2(L) 9.0  Total Protein 6.4 - 8.1 g/dL - - 6.6  Total Bilirubin 0.2 - 1.6 mg/dL - - 0.6  Alkaline  Phos 26 - 84 U/L - - 115(H)  AST 11 - 38 U/L - - 20  ALT 10 - 47 U/L - - 16    CBC Latest Ref Rng & Units 07/07/2017 07/07/2017 06/22/2017  WBC 4.0 - 10.5 K/uL 14.7(H) 14.4(H) 9.7  Hemoglobin 13.0 - 17.0 g/dL 9.6(L) 10.0(L) -  Hematocrit 39.0 - 52.0 % 30.0(L) 30.1(L) 28.9(L)  Platelets 150 - 400 K/uL 211 237 223   BNP (last 3 results) Recent Labs    07/07/17 0139  BNP 1,185.9*   Cardiac Panel (last 3 results) Recent Labs    07/07/17 0530 07/07/17 1405  TROPONINI 0.29* 1.12*     Dg Chest 2 View  Result Date: 07/07/2017 CLINICAL DATA:  Acute onset of shortness of breath. EXAM: CHEST - 2 VIEW COMPARISON:  Chest radiograph performed 05/23/2017 FINDINGS:  There is a mildly displaced fracture of the left lateral seventh rib. Mild vascular congestion is noted. Peribronchial thickening is noted. A small left pleural effusion is noted. The heart is normal in size. No acute osseous abnormalities are identified. IMPRESSION: 1. Mildly displaced fracture of the left lateral seventh rib. 2. Mild vascular congestion and small left pleural effusion. Peribronchial thickening noted. This may reflect transient interstitial edema given left-sided rib fracture. Electronically Signed   By: Garald Balding M.D.   On: 07/07/2017 02:20    Review of Systems  Constitutional: Negative.   HENT: Negative.   Eyes: Negative.   Respiratory: Positive for shortness of breath and wheezing. Negative for cough and sputum production.   Cardiovascular: Positive for orthopnea, leg swelling and PND. Negative for chest pain, palpitations and claudication.  Gastrointestinal: Positive for heartburn. Negative for abdominal pain, diarrhea and nausea.  Genitourinary: Positive for urgency.  Musculoskeletal: Positive for back pain and joint pain.  Skin: Positive for rash (chronic leg pigmentation).  Neurological: Positive for tremors.  Endo/Heme/Allergies: Negative.   Psychiatric/Behavioral: Negative.   All other systems reviewed and are negative.    Scheduled Meds: . amLODipine  5 mg Oral Daily  . aspirin EC  81 mg Oral Daily  . atorvastatin  10 mg Oral QHS  . carvedilol  3.125 mg Oral BID WC  . doxazosin  8 mg Oral Daily  . escitalopram  10 mg Oral Daily  . finasteride  5 mg Oral Daily  . furosemide  80 mg Intravenous BID  . heparin  5,000 Units Subcutaneous Q8H  . [START ON 07/08/2017] insulin aspart  0-9 Units Subcutaneous TID WC  . levothyroxine  125 mcg Oral QAC breakfast  . [START ON 07/08/2017] pantoprazole  40 mg Oral Daily  . sodium chloride flush  3 mL Intravenous Q12H  . tamsulosin  0.4 mg Oral QPC supper  . triamcinolone cream   Topical BID   Continuous  Infusions: . sodium chloride     PRN Meds:.sodium chloride, acetaminophen, ondansetron (ZOFRAN) IV, sodium chloride flush  Blood pressure (!) 177/58, pulse 96, temperature 97.9 F (36.6 C), temperature source Oral, resp. rate 18, height 5\' 6"  (1.676 m), weight 77.8 kg (171 lb 9.6 oz), SpO2 94 %. Physical Exam  Constitutional: He is oriented to person, place, and time. He appears well-developed and well-nourished. No distress.  HENT:  Head: Atraumatic.  Eyes: Conjunctivae are normal.  Neck: Normal range of motion. Neck supple.  Cardiovascular:  S1 and S2 is normal, there is a 3/6 systolic ejection murmur in the aortic area, conducted to the carotid with bilateral carotid bruit, no gallop.  Carotid bilateral bruit, femoral  pulses normal, absent popliteal and pedal pulses.  Bilateral 2+ leg edema with chronic venous stasis dermatitis present.  Respiratory: Effort normal. He has rales (Bilateral basal crackles present.).  GI: Soft. Bowel sounds are normal.  Musculoskeletal: Normal range of motion. He exhibits edema.  Neurological: He is alert and oriented to person, place, and time.  Skin: Skin is warm and dry. He is not diaphoretic.  Psychiatric: He has a normal mood and affect.   Out Patient studies: Carotid artery duplex 01/04/2017: Stenosis in the right internal carotid artery (50-69%). Stenosis in the left internal carotid artery (16-49%). Homogenous plaque bilateral carotids.  Antegrade  vertebral artery flow.  Compared to the study done on 05/10/2016, no significant change. Follow up in six months is appropriate if clinically indicated.  Inpatient studies, echocardiogram 07/07/2017: LV is normal in size, moderately reduced LVEF at 35-40% with mid to apical anteroseptal akinesis.  Grade 2 diastolic dysfunction.  Mild aortic stenosis, aortic valve area 1.34 cm.  Moderate MR, mild biatrial enlargement, moderate to severe pulmonary hypertension with PA pressure 62 mmHg.  Compared to  03/09/2016, EF has reduced from 55-60%, pulmonary hypertension new.  Assessment/Plan: 1.  Non-STEMI involving the anterolateral wall 2.  Acute systolic and diastolic heart failure  3.  Diabetes mellitus type 2 controlled without hypoglycemia with stage IV chronic kidney disease 4.  Hypertension 5.  Hyperlipidemia 6.  Asymptomatic carotid artery stenosis.  Recommendation:  [name] is a pleasant 77 year old Caucasian male with history of stage IV chronic kidney disease, chronic diastolic heart failure, ejection fraction around 55-60% by echocardiogram in November 2017, has had a negative nuclear stress test in November 2017 January 2018 with normal LVEF.  He has asymptomatic right ICA stenosis, controlled diabetes mellitus, GERD, anemia of chronic disease, hypertension.  He had been doing well until last night when he was watching TV, went to bed and suddenly felt markedly dyspneic, EMS was activated and brought to the emergency room where he was found to be in congestive heart failure and was diuresed and admitted to the hospital.  He was started on furosemide and he responded well and his dyspnea has improved.  Due to positive cardiac markers, new onset of LV systolic dysfunction by echocardiogram and wall motion abnormality, I was consulted to see the patient.  He still has evidence of congestive heart failure with bibasilar crackles, elevated JVD and leg edema.  Continue present dose of diuretics.  I will add hydralazine for his blood pressure control and also for afterload reduction.  Cannot use ACE inhibitors due to chronic renal failure.  I will also add Brilinta 90 mg p.o. twice daily along with continued aspirin 81 mg daily.  Will change heparin to full dose heparin for now for the next 24-48 hours.  I discussed with the patient that in view of moderate to severe decrease in LVEF and wall motion abnormality, positive cardiac markers, given his cardiac risk factors including  hypertension, diabetes and hyperlipidemia, he will need cardiac catheterization.  He is extremely high risk.  However after discussions, patient would prefer to continue medical therapy only as he would prefer quality of life compared to quantity of life as he is completely  averse to going on hemodialysis.  He follows up with Dr. Erling [name].  Hence he will not be scheduled for cardiac catheterization.  I will also increase his atorvastatin to 80 mg daily for now.  Thank you for the consultation.  Larry [name] 07/07/2017, 4:49 PM

## 2017-07-07 NOTE — Progress Notes (Signed)
SATURATION QUALIFICATIONS: (This note is used to comply with regulatory documentation for home oxygen)  Patient Saturations on Room Air at Rest = 100%  Patient Saturations on Room Air while Ambulating = 92%  Patient walked entire length of hallway, tolerated well.

## 2017-07-07 NOTE — ED Provider Notes (Signed)
[facility] EMERGENCY DEPARTMENT Provider Note   CSN: [id number] Arrival date & time: 07/07/17  0123     History   Chief Complaint Chief Complaint  Patient presents with  . Shortness of Breath    HPI ESTHER BROYLES is a 77 y.o. male.  The history is provided by the patient and medical records.     77 year old male with history of chronic kidney disease, stage V, GERD, gout, hypertension, hypothyroidism, diabetes, congestive heart failure with EF of 55-60% on last echo November 2017, presenting to the ED with sudden onset shortness of breath.  This began about 30 mins PTA.  He denies any cough, fever, hemoptysis, or chest pain.  States leg have been more swollen than normal lately but actually imrpoved a little at present.  He does report symptoms are worse when trying to lay flat to sleep.  He is followed by cardiology, Dr. Nadyne Coombes.    Past Medical History:  Diagnosis Date  . Anemia of chronic renal failure, stage 4 (severe) ([place]) 09/06/2016  . Chronic kidney disease    "I see [name]" (03/07/2016)  . Chronic kidney disease, stage 5 ([place])   . 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 GERD (gastroesophageal reflux disease)   . Gout   . H/O exposure to tuberculosis   . History of kidney stones    "they passed" (03/07/2016)  . Hyperkalemia   . Hypertension   . Hypotestosteronism 03/29/2011  . Hypothyroidism   . Non Hodgkin's lymphoma ([place])   . 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 Thyroid disease   . Type II diabetes mellitus ([place]) dx'd ~ 2005    Patient Active Problem List   Diagnosis Date Noted  . Azotemia   . Uremia 10/18/2016  . Skin rash 10/18/2016  . General weakness 10/18/2016  . Chronic kidney disease, stage 5 ([place])   . Anemia of chronic renal failure, stage 4 (severe) ([place]) 09/06/2016  . 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 Nephrotic syndrome due to type 2 diabetes mellitus ([place]) 03/11/2016  . Furunculosis  03/10/2016  . Acute diastolic CHF (congestive heart failure) ([place]) 03/10/2016  . Cellulitis, neck 03/09/2016  . Acute congestive heart failure ([place])   . Pressure injury of skin 03/08/2016  . Acute renal failure superimposed on stage 4 chronic kidney disease ([place]) 03/08/2016  . CHF (congestive heart failure) ([place]) 03/07/2016  . Elevated troponin 03/07/2016  . Acute kidney injury superimposed on CKD ([place]) 03/07/2016  . Fever 03/07/2016  . Urinary retention s/p foley x2 08/19/2011  . Acute renal failure ([place]) 08/19/2011  . Chronic renal insufficiency, stage II (mild) 08/19/2011  . Duodenal ulcer, perforated, s/p Omental patch [id number] 08/11/2011  . 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 Hypotestosteronism 03/29/2011  . Diabetes mellitus ([place]) 08/28/2007  . Essential hypertension 08/28/2007  . ALLERGIC RHINITIS 08/28/2007  . PLEURAL EFFUSION, LEFT 08/28/2007    Past Surgical History:  Procedure Laterality Date  . APPENDECTOMY  1948  . INGUINAL HERNIA REPAIR Right ~ 1948  . LAPAROTOMY  08/11/2011   Procedure: EXPLORATORY LAPAROTOMY;  Surgeon: Zenovia Jarred, MD;  Location: [facility];  Service: General;  Laterality: N/A;  . PANCREATECTOMY  2009   Partial w/ splenectomy  . perforation of duodenum repair     2013  . SPLENECTOMY, TOTAL  2009  . TONSILLECTOMY         Home Medications    Prior to Admission medications   Medication Sig Start  Date End Date Taking? Authorizing Provider  amLODipine (NORVASC) 5 MG tablet Take 5 mg by mouth daily.   Yes [provider]  aspirin EC 81 MG EC tablet Take 1 tablet (81 mg total) by mouth daily. 03/13/16  Yes Tat, Shanon Brow, MD  atorvastatin (LIPITOR) 10 MG tablet Take 10 mg by mouth at bedtime.    Yes [provider]  carvedilol (COREG) 3.125 MG tablet Take 3.125 mg by mouth 2 (two) times daily with a meal.   Yes [provider]  COLCRYS 0.6 MG tablet Take 0.6 mg by mouth daily as needed (Gout).   02/06/17  Yes [provider]  doxazosin (CARDURA) 8 MG tablet Take 8 mg by mouth. 01/17/17  Yes [provider]  escitalopram (LEXAPRO) 10 MG tablet Take 1 tablet (10 mg total) by mouth daily. 12/08/16  Yes Volanda Napoleon, MD  finasteride (PROSCAR) 5 MG tablet Take 5 mg by mouth daily.  12/28/16  Yes [provider]  furosemide (LASIX) 80 MG tablet Take 80 mg by mouth 2 (two) times daily.    Yes [provider]  JANUVIA 50 MG tablet Take 25 mg by mouth daily.  12/08/16  Yes [provider]  levothyroxine (SYNTHROID, LEVOTHROID) 125 MCG tablet Take 125 mcg by mouth daily before breakfast.   Yes [provider]  omeprazole (PRILOSEC) 20 MG capsule Take 20 mg by mouth 2 (two) times daily.    Yes [provider]  tamsulosin (FLOMAX) 0.4 MG CAPS capsule Take 0.4 mg by mouth daily after supper.  12/28/16  Yes [provider]  triamcinolone cream (KENALOG) 0.1 % APPLY 1 APPLICATION TOPICALLY 2 (TWO) TIMES DAILY. Patient not taking: Reported on 07/07/2017 06/25/17   [place], Holli Humbles, NP    Family History Family History  Problem Relation Age of Onset  . Tuberculosis Father     Social History Social History   Tobacco Use  . Smoking status: Never Smoker  . Smokeless tobacco: Never Used  Substance Use Topics  . Alcohol use: No    Alcohol/week: 0.0 oz  . Drug use: No     Allergies   Iohexol and Allopurinol   Review of Systems Review of Systems  Respiratory: Positive for shortness of breath.   Cardiovascular: Positive for leg swelling.  All other systems reviewed and are negative.    Physical Exam Updated Vital Signs BP (!) 155/78 (BP Location: Right Arm)   Pulse (!) 107   Temp 97.9 F (36.6 C) (Oral)   Resp 18   Ht 5\' 8"  (1.727 m)   Wt 75.8 kg (167 lb)   SpO2 94%   BMI 25.39 kg/m   Physical Exam  Constitutional: He is oriented to person, place, and time. He appears well-developed and well-nourished.    HENT:  Head: Normocephalic and atraumatic.  Mouth/Throat: Oropharynx is clear and moist.  Eyes: Conjunctivae and EOM are normal. Pupils are equal, round, and reactive to light.  Neck: Normal range of motion.  Cardiovascular: Normal rate, regular rhythm and normal heart sounds.  Pulmonary/Chest: Effort normal and breath sounds normal.  Rales at bases, speaking in short sentences and appears winded when doing so; O2 sats 88% during conversation sitting in bed  Abdominal: Soft. Bowel sounds are normal.  Musculoskeletal: Normal range of motion.  Neurological: He is alert and oriented to person, place, and time.  Skin: Skin is warm and dry.  Dry, scaling rash across BUE and torso, chronic  Psychiatric: He has a  normal mood and affect.  Nursing note and vitals reviewed.    ED Treatments / Results  Labs (all labs ordered are listed, but only abnormal results are displayed) Labs Reviewed  CBC WITH DIFFERENTIAL/PLATELET - Abnormal; Notable for the following components:      Result Value   WBC 14.4 (*)    RBC 3.02 (*)    Hemoglobin 10.0 (*)    HCT 30.1 (*)    RDW 17.9 (*)    Neutro Abs 8.0 (*)    Monocytes Absolute 2.6 (*)    Eosinophils Absolute 1.9 (*)    All other components within normal limits  BASIC METABOLIC PANEL - Abnormal; Notable for the following components:   CO2 19 (*)    Glucose, Bld 172 (*)    BUN 49 (*)    Creatinine, Ser 3.49 (*)    Calcium 8.2 (*)    GFR calc non Af Amer 16 (*)    GFR calc Af Amer 18 (*)    All other components within normal limits  BRAIN NATRIURETIC PEPTIDE - Abnormal; Notable for the following components:   B Natriuretic Peptide 1,185.9 (*)    All other components within normal limits  GLUCOSE, CAPILLARY - Abnormal; Notable for the following components:   Glucose-Capillary 129 (*)    All other components within normal limits  I-STAT TROPONIN, ED - Abnormal; Notable for the following components:   Troponin i, poc 0.27 (*)    All other  components within normal limits  TROPONIN I  TROPONIN I  TROPONIN I    EKG  EKG Interpretation  Date/Time:  Saturday July 07 2017 01:28:38 EST Ventricular Rate:  103 PR Interval:  164 QRS Duration: 80 QT Interval:  332 QTC Calculation: 434 R Axis:   67 Text Interpretation:  Sinus tachycardia with Premature atrial complexes Anteroseptal infarct , age undetermined Abnormal ECG No STEMI.  Confirmed by Nanda Quinton [phone]) on 07/07/2017 1:42:34 AM       Radiology Dg Chest 2 View  Result Date: 07/07/2017 CLINICAL DATA:  Acute onset of shortness of breath. EXAM: CHEST - 2 VIEW COMPARISON:  Chest radiograph performed 05/23/2017 FINDINGS: There is a mildly displaced fracture of the left lateral seventh rib. Mild vascular congestion is noted. Peribronchial thickening is noted. A small left pleural effusion is noted. The heart is normal in size. No acute osseous abnormalities are identified. IMPRESSION: 1. Mildly displaced fracture of the left lateral seventh rib. 2. Mild vascular congestion and small left pleural effusion. Peribronchial thickening noted. This may reflect transient interstitial edema given left-sided rib fracture. Electronically Signed   By: Garald Balding M.D.   On: 07/07/2017 02:20    Procedures Procedures (including critical care time)  CRITICAL CARE Performed by: Larene Pickett   Total critical care time: 31 minutes  Critical care time was exclusive of separately billable procedures and treating other patients.  Critical care was necessary to treat or prevent imminent or life-threatening deterioration.  Critical care was time spent personally by me on the following activities: development of treatment plan with patient and/or surrogate as well as nursing, discussions with consultants, evaluation of patient's response to treatment, examination of patient, obtaining history from patient or surrogate, ordering and performing treatments and interventions, ordering and  review of laboratory studies, ordering and review of radiographic studies, pulse oximetry and re-evaluation of patient's condition.   Medications Ordered in ED Medications - No data to display   Initial Impression / Assessment and Plan / ED  Course  I have reviewed the triage vital signs and the nursing notes.  Pertinent labs & imaging results that were available during my care of the patient were reviewed by me and considered in my medical decision making (see chart for details).  77 year old male here with sudden onset shortness of breath about 40 minutes prior to arrival.  He is afebrile and nontoxic.  His breathing does seem labored, speaking in short sentences and very winded when doing so.  Oxygen saturation dipping down to 88% on room air.  He is generally not oxygen dependent.  Improved to 94% with supplemental 2 L.  Does have rails on exam as well as some peripheral edema.  Suspect CHF exacerbation.  Labs and chest x-ray pending.  Labs with elevated troponin, suspect this is demand secondary to his CHF.  Patient denies chest pain.  Chest x-ray does reveal some pulmonary edema but also has rib fracture noted.  Reports he stumbled a few weeks ago and struck his left ribs against the arm of the couch.  Fracture is likely old.  He has been given IV lasix here.  Remains stable on supplemental 2L.  Will admit for ongoing care.  Discussed with Dr. Alcario Drought-- he will admit for ongoing care.  Final Clinical Impressions(s) / ED Diagnoses   Final diagnoses:  Acute on chronic congestive heart failure, unspecified heart failure type ([place])  Hypoxia  Closed fracture of one rib of left side, initial encounter    ED Discharge Orders    None       Larene Pickett, PA-C 07/07/17 2233    Margette Fast, MD 07/08/17 [phone]

## 2017-07-07 NOTE — Progress Notes (Signed)
ANTICOAGULATION CONSULT NOTE - Initial Consult  Pharmacy Consult for heparin  Indication: chest pain/ACS  Allergies  Allergen Reactions  . Iohexol Other (See Comments)    Pt states that he was told by MD not to ever have contrast again.    . Allopurinol Rash    Patient Measurements: Height: 5\' 6"  (167.6 cm) Weight: 171 lb 9.6 oz (77.8 kg)(scale a) IBW/kg (Calculated) : 63.8   Vital Signs: Temp: 97.9 F (36.6 C) (03/09 1239) Temp Source: Oral (03/09 1239) BP: 163/82 (03/09 1700) Pulse Rate: 105 (03/09 1700)  Labs: Recent Labs    07/07/17 0139 07/07/17 0530 07/07/17 0818 07/07/17 1405  HGB 10.0*  --  9.6*  --   HCT 30.1*  --  30.0*  --   PLT 237  --  211  --   CREATININE 3.49*  --  3.40*  --   TROPONINI  --  0.29*  --  1.12*    Estimated Creatinine Clearance: 17.9 mL/min (A) (by C-G formula based on SCr of 3.4 mg/dL (H)).   Medical History: Past Medical History:  Diagnosis Date  . Anemia of chronic renal failure, stage 4 (severe) ([place]) 09/06/2016  . Chronic kidney disease    "I see [name]" (03/07/2016)  . Chronic kidney disease, stage 5 ([place])   . 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 GERD (gastroesophageal reflux disease)   . Gout   . H/O exposure to tuberculosis   . History of kidney stones    "they passed" (03/07/2016)  . Hyperkalemia   . Hypertension   . Hypotestosteronism 03/29/2011  . Hypothyroidism   . Non Hodgkin's lymphoma ([place])   . 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 Thyroid disease   . Type II diabetes mellitus ([place]) dx'd ~ 2005    Assessment: 77 yo male to start heparin for ACS. No known a/c PTA. CBC stable.   Goal of Therapy:  Heparin level 0.3-0.7 units/ml Monitor platelets by anticoagulation protocol: Yes   Plan:  1. Start heparin infusion at 1000 units/hr 2. Check anti-Xa level in 8 hours and daily while on heparin 3. Continue to monitor H&H and platelets  Vincenza Hews, PharmD, BCPS 07/07/2017,  5:23 PM

## 2017-07-07 NOTE — ED Triage Notes (Signed)
Reports sudden onset of SOB that started 30 minutes pta.  Hx of CHF.

## 2017-07-08 DIAGNOSIS — S2232XK Fracture of one rib, left side, subsequent encounter for fracture with nonunion: Secondary | ICD-10-CM

## 2017-07-08 DIAGNOSIS — E1122 Type 2 diabetes mellitus with diabetic chronic kidney disease: Secondary | ICD-10-CM | POA: Diagnosis not present

## 2017-07-08 DIAGNOSIS — I10 Essential (primary) hypertension: Secondary | ICD-10-CM | POA: Diagnosis not present

## 2017-07-08 DIAGNOSIS — Z91041 Radiographic dye allergy status: Secondary | ICD-10-CM | POA: Diagnosis not present

## 2017-07-08 DIAGNOSIS — Z7984 Long term (current) use of oral hypoglycemic drugs: Secondary | ICD-10-CM | POA: Diagnosis not present

## 2017-07-08 DIAGNOSIS — E785 Hyperlipidemia, unspecified: Secondary | ICD-10-CM | POA: Diagnosis present

## 2017-07-08 DIAGNOSIS — S2232XA Fracture of one rib, left side, initial encounter for closed fracture: Secondary | ICD-10-CM | POA: Diagnosis present

## 2017-07-08 DIAGNOSIS — I5041 Acute combined systolic (congestive) and diastolic (congestive) heart failure: Secondary | ICD-10-CM | POA: Diagnosis not present

## 2017-07-08 DIAGNOSIS — E039 Hypothyroidism, unspecified: Secondary | ICD-10-CM | POA: Diagnosis present

## 2017-07-08 DIAGNOSIS — Z79899 Other long term (current) drug therapy: Secondary | ICD-10-CM | POA: Diagnosis not present

## 2017-07-08 DIAGNOSIS — K219 Gastro-esophageal reflux disease without esophagitis: Secondary | ICD-10-CM | POA: Diagnosis present

## 2017-07-08 DIAGNOSIS — D631 Anemia in chronic kidney disease: Secondary | ICD-10-CM | POA: Diagnosis present

## 2017-07-08 DIAGNOSIS — I5031 Acute diastolic (congestive) heart failure: Secondary | ICD-10-CM | POA: Diagnosis not present

## 2017-07-08 DIAGNOSIS — I132 Hypertensive heart and chronic kidney disease with heart failure and with stage 5 chronic kidney disease, or end stage renal disease: Secondary | ICD-10-CM | POA: Diagnosis present

## 2017-07-08 DIAGNOSIS — Z87442 Personal history of urinary calculi: Secondary | ICD-10-CM | POA: Diagnosis not present

## 2017-07-08 DIAGNOSIS — Z9081 Acquired absence of spleen: Secondary | ICD-10-CM | POA: Diagnosis not present

## 2017-07-08 DIAGNOSIS — N400 Enlarged prostate without lower urinary tract symptoms: Secondary | ICD-10-CM | POA: Diagnosis present

## 2017-07-08 DIAGNOSIS — Z7982 Long term (current) use of aspirin: Secondary | ICD-10-CM | POA: Diagnosis not present

## 2017-07-08 DIAGNOSIS — N184 Chronic kidney disease, stage 4 (severe): Secondary | ICD-10-CM | POA: Diagnosis not present

## 2017-07-08 DIAGNOSIS — Z79890 Hormone replacement therapy (postmenopausal): Secondary | ICD-10-CM | POA: Diagnosis not present

## 2017-07-08 DIAGNOSIS — N185 Chronic kidney disease, stage 5: Secondary | ICD-10-CM | POA: Diagnosis not present

## 2017-07-08 DIAGNOSIS — Z888 Allergy status to other drugs, medicaments and biological substances status: Secondary | ICD-10-CM | POA: Diagnosis not present

## 2017-07-08 DIAGNOSIS — I2729 Other secondary pulmonary hypertension: Secondary | ICD-10-CM | POA: Diagnosis present

## 2017-07-08 DIAGNOSIS — I878 Other specified disorders of veins: Secondary | ICD-10-CM | POA: Diagnosis present

## 2017-07-08 DIAGNOSIS — I5043 Acute on chronic combined systolic (congestive) and diastolic (congestive) heart failure: Secondary | ICD-10-CM | POA: Diagnosis present

## 2017-07-08 DIAGNOSIS — N183 Chronic kidney disease, stage 3 (moderate): Secondary | ICD-10-CM | POA: Diagnosis not present

## 2017-07-08 DIAGNOSIS — R011 Cardiac murmur, unspecified: Secondary | ICD-10-CM | POA: Diagnosis present

## 2017-07-08 DIAGNOSIS — M109 Gout, unspecified: Secondary | ICD-10-CM | POA: Diagnosis present

## 2017-07-08 DIAGNOSIS — R0902 Hypoxemia: Secondary | ICD-10-CM | POA: Diagnosis not present

## 2017-07-08 DIAGNOSIS — Z8572 Personal history of non-Hodgkin lymphomas: Secondary | ICD-10-CM | POA: Diagnosis not present

## 2017-07-08 DIAGNOSIS — W1830XA Fall on same level, unspecified, initial encounter: Secondary | ICD-10-CM | POA: Diagnosis present

## 2017-07-08 DIAGNOSIS — I214 Non-ST elevation (NSTEMI) myocardial infarction: Secondary | ICD-10-CM | POA: Diagnosis not present

## 2017-07-08 LAB — GLUCOSE, CAPILLARY
GLUCOSE-CAPILLARY: 98 mg/dL (ref 65–99)
GLUCOSE-CAPILLARY: 158 mg/dL — AB (ref 65–99)
GLUCOSE-CAPILLARY: 153 mg/dL — AB (ref 65–99)
Glucose-Capillary: 83 mg/dL (ref 65–99)
Glucose-Capillary: 81 mg/dL (ref 65–99)

## 2017-07-08 LAB — BASIC METABOLIC PANEL
Anion gap: 11 (ref 5–15)
BUN: 54 mg/dL — AB (ref 6–20)
CALCIUM: 8.6 mg/dL — AB (ref 8.9–10.3)
CO2: 20 mmol/L — AB (ref 22–32)
CREATININE: 3.4 mg/dL — AB (ref 0.61–1.24)
Chloride: 110 mmol/L (ref 101–111)
GFR calc non Af Amer: 16 mL/min — ABNORMAL LOW (ref >60)
GFR, EST AFRICAN AMERICAN: 19 mL/min — AB (ref >60)
Glucose, Bld: 84 mg/dL (ref 65–99)
Potassium: 4.2 mmol/L (ref 3.5–5.1)
SODIUM: 141 mmol/L (ref 135–145)

## 2017-07-08 LAB — HEPARIN LEVEL (UNFRACTIONATED)
HEPARIN UNFRACTIONATED: 0.28 [IU]/mL — AB (ref 0.30–0.70)
Heparin Unfractionated: <0.1 IU/mL — ABNORMAL LOW (ref 0.30–0.70)

## 2017-07-08 LAB — CBC
HCT: 26.2 % — ABNORMAL LOW (ref 39.0–52.0)
Hemoglobin: 8.8 g/dL — ABNORMAL LOW (ref 13.0–17.0)
MCH: 33.6 pg (ref 26.0–34.0)
MCHC: 33.6 g/dL (ref 30.0–36.0)
MCV: 100 fL (ref 78.0–100.0)
Platelets: 209 10*3/uL (ref 150–400)
RBC: 2.62 MIL/uL — AB (ref 4.22–5.81)
RDW: 17.9 % — ABNORMAL HIGH (ref 11.5–15.5)
WBC: 12 10*3/uL — ABNORMAL HIGH (ref 4.0–10.5)

## 2017-07-08 LAB — BRAIN NATRIURETIC PEPTIDE: B NATRIURETIC PEPTIDE 5: 1813.2 pg/mL — AB (ref 0.0–100.0)

## 2017-07-08 MED ORDER — HEPARIN BOLUS VIA INFUSION
2000.0000 [IU] | Freq: Once | INTRAVENOUS | Status: AC
  Administered 2017-07-08: 2000 [IU] via INTRAVENOUS
  Filled 2017-07-08: qty 2000

## 2017-07-08 NOTE — Progress Notes (Signed)
Pt troponin 1.71 on call notified

## 2017-07-08 NOTE — Plan of Care (Signed)
  Progressing Safety: Ability to remain free from injury will improve 07/08/2017 0806 - Progressing by Ardine Eng, RN

## 2017-07-08 NOTE — Progress Notes (Signed)
PROGRESS NOTE    ALIX STOWERS   UJW:[id number]  DOB: [birth year]  DOA: 07/07/2017 PCP: Gaynelle Arabian, MD   Brief Narrative:  TAIWAN TALCOTT is a 77 y.o. male with medical history significant of CKD stage 5, CHF, DM2, non-Hodgkins lymphoma in remission, BPH with chronic foley.  Patient is on lasix 40mg  BID at baseline according to him.   He has noticed increasing shortness of breath over the past week and became suddenly short of breath yesterday. He has been taking his Lasix but was recently drinking more water and has noticed about a 4 lb increase in weight.   Subjective: Dyspnea is improving. He has no new complaints.  ROS: no complaints of nausea, vomiting, constipation diarrhea, cough, dyspnea or dysuria. No other complaints.   Assessment & Plan:   Principal Problem:   Acute diastolic CHF (congestive heart failure)  - cont to diurese with IVF lasix - he continues to feels much better - ECHO shows EF of 35-40% and grade 2 d CHF, mod-severe pulm HTN, mod MR- prior EF 50-55% with grade 2 d CHF  Active Problems:   NSTEMI - troponin has risen form 0.29 > 1.12> 1.71 - he has not had chest pain but acute onset dyspnea could have been an anginal equivalent - appreciate cardiology follow up - he has decided to avoid undergoing a cath to prevent progression to ESRD-  - currently being medically managed on Heparin infusion, Coreg, Aspirin and Brillinta, Lipitor  Displaced Left 7th rib fracture  - noted incidentally on CXR - he admits to a mechanical fall a couple of weeks ago - he states that a CXR at that time did not show any fractures- he currently has no discomfort from his fracture.    Diabetes mellitus 2 - on SSI- takes Januvia at home- sugars controlled    Essential hypertension - cont Norvasc, Coreg and Doxazosin    Chronic kidney disease, stage 5  - follows as outpt with Dr Florene Glen  BPH with chronic foley - cont Proscar and Flomax- also on Doxzosin   DVT  prophylaxis: Heparin Code Status: Full code Family Communication: wife at bedside Disposition Plan: home when stable Consultants:   Cardiology  Procedures:  2 D ECHO Study Conclusions  - Left ventricle: The cavity size was normal. Wall thickness was   normal. Systolic function was moderately reduced. The estimated   ejection fraction was in the range of 35% to 40%. Akinesis of the   mid-apicalanteroseptal myocardium. Features are consistent with a   pseudonormal left ventricular filling pattern, with concomitant   abnormal relaxation and increased filling pressure (grade 2   diastolic dysfunction). - Aortic valve: There was mild stenosis. Valve area (VTI): 1.52   cm^2. Valve area (Vmax): 1.33 cm^2. Valve area (Vmean): 1.34   cm^2. - Mitral valve: There was moderate regurgitation. - Left atrium: The atrium was mildly dilated. - Right atrium: The atrium was mildly dilated. - Pulmonary arteries: Systolic pressure was moderately to severely   increased. PA peak pressure: 62 mm Hg (S). - Pericardium, extracardiac: A trivial pericardial effusion was   identified. Antimicrobials:  Anti-infectives (From admission, onward)   None       Objective: Vitals:   07/07/17 1700 07/07/17 1915 07/08/17 0600 07/08/17 0829  BP: (!) 163/82 134/72 117/61 122/69  Pulse: (!) 105 93 83 94  Resp:  18 18   Temp:  98.5 F (36.9 C) 98 F (36.7 C)   TempSrc:  Oral Oral  SpO2: 94% 94% 94% 94%  Weight:   75.5 kg (166 lb 6.4 oz)   Height:        Intake/Output Summary (Last 24 hours) at 07/08/2017 1402 Last data filed at 07/08/2017 1100 Gross per 24 hour  Intake 860.17 ml  Output 2502 ml  Net -1641.83 ml   Filed Weights   07/07/17 0129 07/07/17 0427 07/08/17 0600  Weight: 75.8 kg (167 lb) 77.8 kg (171 lb 9.6 oz) 75.5 kg (166 lb 6.4 oz)    Examination: General exam: Appears comfortable  HEENT: PERRLA, oral mucosa moist, no sclera icterus or thrush Respiratory system: mild crackles at  bases- Respiratory effort normal. Cardiovascular system: S1 & S2 heard, RRR.  No murmurs  Gastrointestinal system: Abdomen soft, non-tender, nondistended. Normal bowel sound. No organomegaly Central nervous system: Alert and oriented. No focal neurological deficits. Extremities: No cyanosis, clubbing - + 2 pitting edema in legs Psychiatry:  Mood & affect appropriate.     Data Reviewed: I have personally reviewed following labs and imaging studies  CBC: Recent Labs  Lab 07/07/17 0139 07/07/17 0818 07/08/17 0132  WBC 14.4* 14.7* 12.0*  NEUTROABS 8.0*  --   --   HGB 10.0* 9.6* 8.8*  HCT 30.1* 30.0* 26.2*  MCV 99.7 101.0* 100.0  PLT 237 211 188   Basic Metabolic Panel: Recent Labs  Lab 07/07/17 0139 07/07/17 0818 07/08/17 0132  NA 142 141 141  K 4.6 4.6 4.2  CL 110 110 110  CO2 19* 20* 20*  GLUCOSE 172* 147* 84  BUN 49* 49* 54*  CREATININE 3.49* 3.40* 3.40*  CALCIUM 8.2* 8.3* 8.6*   GFR: Estimated Creatinine Clearance: 16.4 mL/min (A) (by C-G formula based on SCr of 3.4 mg/dL (H)). Liver Function Tests: No results for input(s): AST, ALT, ALKPHOS, BILITOT, PROT, ALBUMIN in the last 168 hours. No results for input(s): LIPASE, AMYLASE in the last 168 hours. No results for input(s): AMMONIA in the last 168 hours. Coagulation Profile: No results for input(s): INR, PROTIME in the last 168 hours. Cardiac Enzymes: Recent Labs  Lab 07/07/17 0530 07/07/17 1405 07/07/17 2032  TROPONINI 0.29* 1.12* 1.71*   BNP (last 3 results) No results for input(s): PROBNP in the last 8760 hours. HbA1C: No results for input(s): HGBA1C in the last 72 hours. CBG: Recent Labs  Lab 07/07/17 1610 07/07/17 1957 07/08/17 0312 07/08/17 0734 07/08/17 1203  GLUCAP 118* 132* 83 81 98   Lipid Profile: Recent Labs    07/07/17 2315  CHOL 98  HDL 34*  LDLCALC 54  TRIG 49  CHOLHDL 2.9   Thyroid Function Tests: No results for input(s): TSH, T4TOTAL, FREET4, T3FREE, THYROIDAB in the  last 72 hours. Anemia Panel: No results for input(s): VITAMINB12, FOLATE, FERRITIN, TIBC, IRON, RETICCTPCT in the last 72 hours. Urine analysis:    Component Value Date/Time   COLORURINE YELLOW 03/07/2016 1535   APPEARANCEUR CLEAR 03/07/2016 1535   LABSPEC 1.015 03/07/2016 1535   PHURINE 6.0 03/07/2016 1535   GLUCOSEU NEGATIVE 03/07/2016 1535   HGBUR SMALL (A) 03/07/2016 1535   BILIRUBINUR NEGATIVE 03/07/2016 1535   KETONESUR NEGATIVE 03/07/2016 1535   PROTEINUR >300 (A) 03/07/2016 1535   UROBILINOGEN 0.2 08/11/2011 1050   NITRITE NEGATIVE 03/07/2016 1535   LEUKOCYTESUR NEGATIVE 03/07/2016 1535   Sepsis Labs: @LABRCNTIP (procalcitonin:4,lacticidven:4) )No results found for this or any previous visit (from the past 240 hour(s)).       Radiology Studies: Dg Chest 2 View  Result Date: 07/07/2017 CLINICAL DATA:  Acute  onset of shortness of breath. EXAM: CHEST - 2 VIEW COMPARISON:  Chest radiograph performed 05/23/2017 FINDINGS: There is a mildly displaced fracture of the left lateral seventh rib. Mild vascular congestion is noted. Peribronchial thickening is noted. A small left pleural effusion is noted. The heart is normal in size. No acute osseous abnormalities are identified. IMPRESSION: 1. Mildly displaced fracture of the left lateral seventh rib. 2. Mild vascular congestion and small left pleural effusion. Peribronchial thickening noted. This may reflect transient interstitial edema given left-sided rib fracture. Electronically Signed   By: Garald Balding M.D.   On: 07/07/2017 02:20      Scheduled Meds: . amLODipine  5 mg Oral Daily  . aspirin EC  81 mg Oral Daily  . atorvastatin  80 mg Oral QHS  . carvedilol  3.125 mg Oral BID WC  . doxazosin  8 mg Oral Daily  . escitalopram  10 mg Oral Daily  . finasteride  5 mg Oral Daily  . furosemide  80 mg Intravenous BID  . insulin aspart  0-9 Units Subcutaneous TID WC  . isosorbide-hydrALAZINE  1 tablet Oral TID  . levothyroxine   125 mcg Oral QAC breakfast  . pantoprazole  40 mg Oral Daily  . sodium chloride flush  3 mL Intravenous Q12H  . tamsulosin  0.4 mg Oral QPC supper  . ticagrelor  90 mg Oral BID  . triamcinolone cream   Topical BID   Continuous Infusions: . sodium chloride    . heparin 1,300 Units/hr (07/08/17 0450)     LOS: 0 days    Time spent in minutes: 35    Debbe Odea, MD [facility] Pager: [email or web address] Password [email or web address] 07/08/2017, 2:02 PM

## 2017-07-08 NOTE — Progress Notes (Signed)
ANTICOAGULATION CONSULT NOTE - Follow Up Consult  Pharmacy Consult for heparin Indication: chest pain/ACS  Labs: Recent Labs    07/07/17 0139 07/07/17 0530 07/07/17 0818 07/07/17 1405 07/07/17 2032 07/08/17 0132 07/08/17 1301  HGB 10.0*  --  9.6*  --   --  8.8*  --   HCT 30.1*  --  30.0*  --   --  26.2*  --   PLT 237  --  211  --   --  209  --   HEPARINUNFRC  --   --   --   --   --  <0.10* 0.28*  CREATININE 3.49*  --  3.40*  --   --  3.40*  --   TROPONINI  --  0.29*  --  1.12* 1.71*  --   --     Assessment: 77yo male continues on IV heparin for chest pain No plans for cardiac cath - to continue medical therapy  Goal of Therapy:  Heparin level 0.3-0.7 units/ml   Plan:  Increase heparin to 1450 units / hr Follow up AM labs  Thank you Anette Guarneri, PharmD [phone]  07/08/2017,2:13 PM

## 2017-07-08 NOTE — Care Management Obs Status (Signed)
[facility] NOTIFICATION   Patient Details  Name: Larry [name] MRN: [id number] Date of Birth: Mar 25, 1941   Medicare Observation Status Notification Given:       Norina Buzzard, RN 07/08/2017, 10:23 AM

## 2017-07-08 NOTE — Progress Notes (Signed)
ANTICOAGULATION CONSULT NOTE - Follow Up Consult  Pharmacy Consult for heparin Indication: chest pain/ACS  Labs: Recent Labs    07/07/17 0139 07/07/17 0530 07/07/17 0818 07/07/17 1405 07/07/17 2032 07/08/17 0132  HGB 10.0*  --  9.6*  --   --  8.8*  HCT 30.1*  --  30.0*  --   --  26.2*  PLT 237  --  211  --   --  209  HEPARINUNFRC  --   --   --   --   --  <0.10*  CREATININE 3.49*  --  3.40*  --   --  3.40*  TROPONINI  --  0.29*  --  1.12* 1.71*  --     Assessment: 76yo male undetectable on heparin with initial dosing for possible ACS.  Goal of Therapy:  Heparin level 0.3-0.7 units/ml   Plan:  Will rebolus with heparin 2000 units and increase heparin gtt by 4 units/kg/hr to 1300 units/hr and check level in 8 hours.    Wynona Neat, PharmD, BCPS  07/08/2017,4:40 AM

## 2017-07-09 ENCOUNTER — Encounter (HOSPITAL_COMMUNITY)
Admission: EM | Disposition: A | Discharge status: Home / Self Care | Payer: Self-pay | Source: Home / Self Care | Attending: Internal Medicine

## 2017-07-09 LAB — TROPONIN I: TROPONIN I: 1.58 ng/mL — AB (ref <0.03)

## 2017-07-09 LAB — BASIC METABOLIC PANEL
ANION GAP: 10 (ref 5–15)
BUN: 60 mg/dL — AB (ref 6–20)
CO2: 21 mmol/L — ABNORMAL LOW (ref 22–32)
Calcium: 8.6 mg/dL — ABNORMAL LOW (ref 8.9–10.3)
Chloride: 109 mmol/L (ref 101–111)
Creatinine, Ser: 3.61 mg/dL — ABNORMAL HIGH (ref 0.61–1.24)
GFR, EST AFRICAN AMERICAN: 17 mL/min — AB (ref >60)
GFR, EST NON AFRICAN AMERICAN: 15 mL/min — AB (ref >60)
Glucose, Bld: 89 mg/dL (ref 65–99)
POTASSIUM: 4.1 mmol/L (ref 3.5–5.1)
SODIUM: 140 mmol/L (ref 135–145)

## 2017-07-09 LAB — GLUCOSE, CAPILLARY
GLUCOSE-CAPILLARY: 82 mg/dL (ref 65–99)
GLUCOSE-CAPILLARY: 103 mg/dL — AB (ref 65–99)
Glucose-Capillary: 178 mg/dL — ABNORMAL HIGH (ref 65–99)
Glucose-Capillary: 122 mg/dL — ABNORMAL HIGH (ref 65–99)

## 2017-07-09 LAB — HEPARIN LEVEL (UNFRACTIONATED): HEPARIN UNFRACTIONATED: 0.28 [IU]/mL — AB (ref 0.30–0.70)

## 2017-07-09 LAB — CBC
HCT: 25.6 % — ABNORMAL LOW (ref 39.0–52.0)
Hemoglobin: 8.2 g/dL — ABNORMAL LOW (ref 13.0–17.0)
MCH: 31.8 pg (ref 26.0–34.0)
MCHC: 32 g/dL (ref 30.0–36.0)
MCV: 99.2 fL (ref 78.0–100.0)
PLATELETS: 215 10*3/uL (ref 150–400)
RBC: 2.58 MIL/uL — AB (ref 4.22–5.81)
RDW: 18.3 % — ABNORMAL HIGH (ref 11.5–15.5)
WBC: 10.2 10*3/uL (ref 4.0–10.5)

## 2017-07-09 LAB — BRAIN NATRIURETIC PEPTIDE: B NATRIURETIC PEPTIDE 5: 925.2 pg/mL — AB (ref 0.0–100.0)

## 2017-07-09 SURGERY — LEFT HEART CATH AND CORONARY ANGIOGRAPHY
Anesthesia: LOCAL

## 2017-07-09 MED ORDER — CARVEDILOL 6.25 MG PO TABS
6.2500 mg | ORAL_TABLET | Freq: Two times a day (BID) | ORAL | Status: DC
  Administered 2017-07-09 – 2017-07-10 (×2): 6.25 mg via ORAL
  Filled 2017-07-09 (×2): qty 1

## 2017-07-09 MED ORDER — FUROSEMIDE 80 MG PO TABS
80.0000 mg | ORAL_TABLET | Freq: Two times a day (BID) | ORAL | Status: DC
  Administered 2017-07-09 – 2017-07-10 (×3): 80 mg via ORAL
  Filled 2017-07-09 (×3): qty 1

## 2017-07-09 MED ORDER — ISOSORB DINITRATE-HYDRALAZINE 20-37.5 MG PO TABS
2.0000 | ORAL_TABLET | Freq: Three times a day (TID) | ORAL | Status: DC
  Administered 2017-07-09 – 2017-07-10 (×3): 2 via ORAL
  Filled 2017-07-09 (×3): qty 2

## 2017-07-09 NOTE — Care Management Note (Signed)
Case Management Note  Patient Details  Name: Larry [name] MRN: [id number] Date of Birth: [birth year]  Subjective/Objective:  CHF                 Action/Plan: Patient lives at home with spouse; has private insurance with Medicare; CM following for progression of care.CM following for progression of care. Expected Discharge Date:   possibly 07/13/2017               Expected Discharge Plan:   Home/ self     Status of Service:   In progress  Royston Bake, RN 07/09/2017, 2:09 PM

## 2017-07-09 NOTE — Progress Notes (Signed)
Pt refusing bed alarm. Pt educated and bed alarm turned off.

## 2017-07-09 NOTE — Progress Notes (Signed)
Subjective:  He feels much better.  He has ambulated in the hallway.  He was able to sleep better with less orthopnea.  Objective:  Vital Signs in the last 24 hours: Temp:  [97.3 F (36.3 C)-98 F (36.7 C)] 98 F (36.7 C) (03/11 0544) Pulse Rate:  [84-93] 84 (03/11 0824) Resp:  [18] 18 (03/11 0544) BP: (119-149)/(47-69) 149/63 (03/11 0824) SpO2:  [94 %-95 %] 95 % (03/11 0544) Weight:  [75.4 kg (166 lb 3.2 oz)] 75.4 kg (166 lb 3.2 oz) (03/11 0544)  Intake/Output from previous day: 03/10 0701 - 03/11 0700 In: 1045.1 [P.O.:720; I.V.:325.1] Out: 1601 [Urine:1600; Stool:1]  Physical Exam:  Blood pressure (!) 149/63, pulse 84, temperature 98 F (36.7 C), temperature source Oral, resp. rate 18, height 5\' 6"  (1.676 m), weight 75.4 kg (166 lb 3.2 oz), SpO2 95 %.  General appearance: alert, cooperative, appears stated age and no distress Eyes: negative findings: lids and lashes normal Neck: no adenopathy, no carotid bruit, supple, symmetrical, trachea midline, thyroid not enlarged, symmetric, no tenderness/mass/nodules and JVD present upto the angle of jaw Neck: JVP -elevated just above the sternoclavicular joint, carotids 2+= bilateral soft bruit present Resp:  faint right basilar crackles Chest wall: no tenderness Cardio: S1 and S2 is normal.  2/6 to 3/6 systolic ejection murmur in the aortic and mitral area.  No gallop.  There is also a mid systolic murmur at the apex. GI: soft, non-tender; bowel sounds normal; no masses,  no organomegaly Extremities: extremities normal, atraumatic, no cyanosis or edema and Leg edema is essentially resolved except at the ankles.  Chronic venous stasis dermatitis present.  Lab Results: BMP Recent Labs    07/07/17 0818 07/08/17 0132 07/09/17 0452  NA 141 141 140  K 4.6 4.2 4.1  CL 110 110 109  CO2 20* 20* 21*  GLUCOSE 147* 84 89  BUN 49* 54* 60*  CREATININE 3.40* 3.40* 3.61*  CALCIUM 8.3* 8.6* 8.6*  GFRNONAA 16* 16* 15*  GFRAA 19* 19* 17*     CBC Recent Labs  Lab 07/07/17 0139  07/09/17 0452  WBC 14.4*   < > 10.2  RBC 3.02*   < > 2.58*  HGB 10.0*   < > 8.2*  HCT 30.1*   < > 25.6*  PLT 237   < > 215  MCV 99.7   < > 99.2  MCH 33.1   < > 31.8  MCHC 33.2   < > 32.0  RDW 17.9*   < > 18.3*  LYMPHSABS 1.8  --   --   MONOABS 2.6*  --   --   EOSABS 1.9*  --   --   BASOSABS 0.0  --   --    < > = values in this interval not displayed.    HEMOGLOBIN A1C Lab Results  Component Value Date   HGBA1C 7.3 (H) 10/18/2016   MPG 163 10/18/2016    Cardiac Panel (last 3 results) Recent Labs    07/07/17 0530 07/07/17 1405 07/07/17 2032  TROPONINI 0.29* 1.12* 1.71*   BNP    Component Value Date/Time   BNP 925.2 (H) 07/09/2017 0452   TSH Recent Labs    10/18/16 1642  TSH 0.147*    Lipid Panel     Component Value Date/Time   CHOL 98 07/07/2017 2315   TRIG 49 07/07/2017 2315   HDL 34 (L) 07/07/2017 2315   CHOLHDL 2.9 07/07/2017 2315   VLDL 10 07/07/2017 2315   LDLCALC 54 07/07/2017  2315     Hepatic Function Panel Recent Labs    04/20/17 1259 06/01/17 1017 06/22/17 1020  PROT 6.4 6.7 6.6  ALBUMIN 2.8* 3.0* 2.9*  AST 16 20 20   ALT 16 19 16   ALKPHOS 103* 110* 115*  BILITOT 0.60 0.5 0.6   Scheduled Meds: . amLODipine  5 mg Oral Daily  . aspirin EC  81 mg Oral Daily  . atorvastatin  80 mg Oral QHS  . carvedilol  3.125 mg Oral BID WC  . doxazosin  8 mg Oral Daily  . escitalopram  10 mg Oral Daily  . finasteride  5 mg Oral Daily  . furosemide  80 mg Intravenous BID  . insulin aspart  0-9 Units Subcutaneous TID WC  . isosorbide-hydrALAZINE  1 tablet Oral TID  . levothyroxine  125 mcg Oral QAC breakfast  . pantoprazole  40 mg Oral Daily  . sodium chloride flush  3 mL Intravenous Q12H  . tamsulosin  0.4 mg Oral QPC supper  . ticagrelor  90 mg Oral BID  . triamcinolone cream   Topical BID   Continuous Infusions: . sodium chloride     PRN Meds:.sodium chloride, acetaminophen, ondansetron  (ZOFRAN) IV, sodium chloride flush   Out Patient studies: Carotid artery duplex 01/04/2017: Stenosis in the right internal carotid artery (50-69%). Stenosis in the left internal carotid artery (16-49%). Homogenous plaque bilateral carotids.  Antegrade vertebral artery flow.  Compared to the study done on 05/10/2016, no significant change. Follow up in six months is appropriate if clinically indicated.  Inpatient studies, echocardiogram 07/07/2017:  LV is normal in size, moderately reduced LVEF at 35-40% with mid to apical anteroseptal akinesis. Grade 2 diastolic dysfunction. Mild aortic stenosis, aortic valve area 1.34 cm. Moderate MR, mild biatrial enlargement, moderate to severe pulmonary hypertension with PA pressure 62 mmHg.Compared to 03/09/2016, EF has reduced from 55-60%, pulmonary hypertension new.  Assessment/Plan:  1. Non-STEMI involving the anterolateral wall 2. Acute systolic and diastolic heart failure  3. Diabetes mellitus type 2 controlled without hypoglycemia with stage IV chronic kidney disease 4. Hypertension 5. Hyperlipidemia 6. Asymptomatic carotid artery stenosis. 7. 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 8.  Anemia of chronic disease, hemoglobin is stabilized.  Did drop initially.  Recommendation: We will discontinue IV heparin for now.  Continue BiDil, will increase to 2 tablets 3 times daily.  Which will also help with blood pressure control, will also increase Coreg to 6.25 mg p.o. twice daily.  Lipids are being treated with Lipitor.  Patient appears to be slightly iron deficient although has chronic anemia from stage IV chronic kidney disease.  Consider IV infusion of iron if appropriate prior to discharge.  Will change to p.o. Lasix 40 mg p.o. twice daily.  I think he can potentially be discharged tomorrow.  With regard to non-ST elevation MI, patient prefers medical therapy in view of high risk for developing ESRD.  Continue  Brilinta.   Adrian Prows, M.D. 07/09/2017, 10:33 AM [facility] Pager: [phone] Office: [phone] If no answer: [phone]

## 2017-07-09 NOTE — Progress Notes (Addendum)
PROGRESS NOTE    Larry [name]   CXK:[id number]  DOB: [birth year]  DOA: 07/07/2017 PCP: Gaynelle Arabian, MD   Brief Narrative:  Larry [name] is a 77 y.o. male with medical history significant of CKD stage 5, CHF, DM2, non-Hodgkins lymphoma in remission, BPH with chronic foley.  Patient is on lasix 40mg  BID at baseline according to him.   He has noticed increasing shortness of breath over the past week and became suddenly short of breath yesterday. He has been taking his Lasix but was recently drinking more water and has noticed about a 4 lb increase in weight.   Subjective: Dyspnea continues to improve. He is ambulating without difficulty.  ROS: no complaints of nausea, vomiting, constipation diarrhea, cough, dyspnea or dysuria. No other complaints.   Assessment & Plan:   Principal Problem:   Acute diastolic CHF (congestive heart failure)    - he continues to feels much better - ECHO shows a drop in EF to 35-40% and grade 2 d CHF, mod-severe pulm HTN, mod MR - prior EF 50-55% with grade 2 d CHF - cardiology has switched him to oral Lasix today and increased BiDil to TID    Active Problems:   NSTEMI - troponin has risen form 0.29 > 1.12> 1.71 - he has not had chest pain but acute onset dyspnea could have been an anginal equivalent - appreciate cardiology follow up - he has decided to avoid undergoing a cath to prevent progression to ESRD-  - currently being medically managed on Heparin infusion, Coreg, Aspirin and Brillinta, Lipitor -  d/c'd Heparin this AM-    Displaced Left 7th rib fracture  - noted incidentally on CXR - he admits to a mechanical fall a couple of weeks ago - he states that a CXR at that time did not show any fractures- he currently has no discomfort from his fracture.    Diabetes mellitus 2 - on SSI- takes Januvia at home- sugars controlled    Essential hypertension - cont Norvasc, Coreg and Doxazosin    Chronic kidney disease, stage 5  - follows as  outpt with Dr Florene Glen  BPH with chronic foley - cont Proscar and Flomax- also on Doxzosin  Anemia of chronic disease - cont outpt follow up- ? If he needs/is receiving Aranesp due to CKD5   DVT prophylaxis: Heparin Code Status: Full code Family Communication: wife at bedside Disposition Plan: home when stable Consultants:   Cardiology  Procedures:  2 D ECHO Study Conclusions  - Left ventricle: The cavity size was normal. Wall thickness was   normal. Systolic function was moderately reduced. The estimated   ejection fraction was in the range of 35% to 40%. Akinesis of the   mid-apicalanteroseptal myocardium. Features are consistent with a   pseudonormal left ventricular filling pattern, with concomitant   abnormal relaxation and increased filling pressure (grade 2   diastolic dysfunction). - Aortic valve: There was mild stenosis. Valve area (VTI): 1.52   cm^2. Valve area (Vmax): 1.33 cm^2. Valve area (Vmean): 1.34   cm^2. - Mitral valve: There was moderate regurgitation. - Left atrium: The atrium was mildly dilated. - Right atrium: The atrium was mildly dilated. - Pulmonary arteries: Systolic pressure was moderately to severely   increased. PA peak pressure: 62 mm Hg (S). - Pericardium, extracardiac: A trivial pericardial effusion was   identified. Antimicrobials:  Anti-infectives (From admission, onward)   None       Objective: Vitals:   07/08/17 1524 07/08/17  1913 07/09/17 0544 07/09/17 0824  BP: (!) 133/52 132/63 130/69 (!) 149/63  Pulse: 93 90 87 84  Resp:  18 18   Temp:  97.7 F (36.5 C) 98 F (36.7 C)   TempSrc:  Oral Oral   SpO2: 95% 94% 95%   Weight:   75.4 kg (166 lb 3.2 oz)   Height:        Intake/Output Summary (Last 24 hours) at 07/09/2017 1042 Last data filed at 07/09/2017 0825 Gross per 24 hour  Intake 1042.1 ml  Output 1600 ml  Net -557.9 ml   Filed Weights   07/07/17 0427 07/08/17 0600 07/09/17 0544  Weight: 77.8 kg (171 lb 9.6 oz) 75.5  kg (166 lb 6.4 oz) 75.4 kg (166 lb 3.2 oz)    Examination: General exam: Appears comfortable  HEENT: PERRLA, oral mucosa moist, no sclera icterus or thrush Respiratory system: mild crackles at right base today- Respiratory effort normal. Cardiovascular system: S1 & S2 heard, RRR.  No murmurs  Gastrointestinal system: Abdomen soft, non-tender, nondistended. Normal bowel sound. No organomegaly Central nervous system: Alert and oriented. No focal neurological deficits. Extremities: No cyanosis, clubbing - + 2 pitting edema in legs Psychiatry:  Mood & affect appropriate.     Data Reviewed: I have personally reviewed following labs and imaging studies  CBC: Recent Labs  Lab 07/07/17 0139 07/07/17 0818 07/08/17 0132 07/09/17 0452  WBC 14.4* 14.7* 12.0* 10.2  NEUTROABS 8.0*  --   --   --   HGB 10.0* 9.6* 8.8* 8.2*  HCT 30.1* 30.0* 26.2* 25.6*  MCV 99.7 101.0* 100.0 99.2  PLT 237 211 209 034   Basic Metabolic Panel: Recent Labs  Lab 07/07/17 0139 07/07/17 0818 07/08/17 0132 07/09/17 0452  NA 142 141 141 140  K 4.6 4.6 4.2 4.1  CL 110 110 110 109  CO2 19* 20* 20* 21*  GLUCOSE 172* 147* 84 89  BUN 49* 49* 54* 60*  CREATININE 3.49* 3.40* 3.40* 3.61*  CALCIUM 8.2* 8.3* 8.6* 8.6*   GFR: Estimated Creatinine Clearance: 15.5 mL/min (A) (by C-G formula based on SCr of 3.61 mg/dL (H)). Liver Function Tests: No results for input(s): AST, ALT, ALKPHOS, BILITOT, PROT, ALBUMIN in the last 168 hours. No results for input(s): LIPASE, AMYLASE in the last 168 hours. No results for input(s): AMMONIA in the last 168 hours. Coagulation Profile: No results for input(s): INR, PROTIME in the last 168 hours. Cardiac Enzymes: Recent Labs  Lab 07/07/17 0530 07/07/17 1405 07/07/17 2032  TROPONINI 0.29* 1.12* 1.71*   BNP (last 3 results) No results for input(s): PROBNP in the last 8760 hours. HbA1C: No results for input(s): HGBA1C in the last 72 hours. CBG: Recent Labs  Lab  07/08/17 0734 07/08/17 1203 07/08/17 1640 07/08/17 2003 07/09/17 0755  GLUCAP 81 98 158* 153* 82   Lipid Profile: Recent Labs    07/07/17 2315  CHOL 98  HDL 34*  LDLCALC 54  TRIG 49  CHOLHDL 2.9   Thyroid Function Tests: No results for input(s): TSH, T4TOTAL, FREET4, T3FREE, THYROIDAB in the last 72 hours. Anemia Panel: No results for input(s): VITAMINB12, FOLATE, FERRITIN, TIBC, IRON, RETICCTPCT in the last 72 hours. Urine analysis:    Component Value Date/Time   COLORURINE YELLOW 03/07/2016 1535   APPEARANCEUR CLEAR 03/07/2016 1535   LABSPEC 1.015 03/07/2016 1535   PHURINE 6.0 03/07/2016 1535   GLUCOSEU NEGATIVE 03/07/2016 1535   HGBUR SMALL (A) 03/07/2016 1535   BILIRUBINUR NEGATIVE 03/07/2016 1535  KETONESUR NEGATIVE 03/07/2016 1535   PROTEINUR >300 (A) 03/07/2016 1535   UROBILINOGEN 0.2 08/11/2011 1050   NITRITE NEGATIVE 03/07/2016 1535   LEUKOCYTESUR NEGATIVE 03/07/2016 1535   Sepsis Labs: @LABRCNTIP (procalcitonin:4,lacticidven:4) )No results found for this or any previous visit (from the past 240 hour(s)).       Radiology Studies: No results found.    Scheduled Meds: . amLODipine  5 mg Oral Daily  . aspirin EC  81 mg Oral Daily  . atorvastatin  80 mg Oral QHS  . carvedilol  3.125 mg Oral BID WC  . doxazosin  8 mg Oral Daily  . escitalopram  10 mg Oral Daily  . finasteride  5 mg Oral Daily  . furosemide  80 mg Intravenous BID  . insulin aspart  0-9 Units Subcutaneous TID WC  . isosorbide-hydrALAZINE  1 tablet Oral TID  . levothyroxine  125 mcg Oral QAC breakfast  . pantoprazole  40 mg Oral Daily  . sodium chloride flush  3 mL Intravenous Q12H  . tamsulosin  0.4 mg Oral QPC supper  . ticagrelor  90 mg Oral BID  . triamcinolone cream   Topical BID   Continuous Infusions: . sodium chloride       LOS: 1 day    Time spent in minutes: 35    Debbe Odea, MD [facility] Pager: [email or web address] Password [facility] 07/09/2017, 10:42 AM

## 2017-07-09 NOTE — Progress Notes (Signed)
Please note this is a note from yesterday which is somehow missing.  Subjective:  Feels much better. Leg edema has improved. DYspnea b etter.   Objective:  Vital Signs in the last 24 hours: Temp:  [97.3 F (36.3 C)-98 F (36.7 C)] 98 F (36.7 C) (03/11 0544) Pulse Rate:  [84-93] 84 (03/11 0824) Resp:  [18] 18 (03/11 0544) BP: (119-149)/(47-69) 149/63 (03/11 0824) SpO2:  [94 %-95 %] 95 % (03/11 0544) Weight:  [75.4 kg (166 lb 3.2 oz)] 75.4 kg (166 lb 3.2 oz) (03/11 0544)  Intake/Output from previous day: 03/10 0701 - 03/11 0700 In: 1045.1 [P.O.:720; I.V.:325.1] Out: 1601 [Urine:1600; Stool:1]  Physical Exam: Blood pressure (!) 149/63, pulse 84, temperature 98 F (36.7 C), temperature source Oral, resp. rate 18, height 5\' 6"  (1.676 m), weight 75.4 kg (166 lb 3.2 oz), SpO2 95 %.  General appearance: alert, cooperative, appears stated age and no distress Eyes: negative findings: lids and lashes normal Neck: no adenopathy, no carotid bruit, supple, symmetrical, trachea midline, thyroid not enlarged, symmetric, no tenderness/mass/nodules and JVD present upto the angle of jaw Neck: JVP - normal, carotids 2+= without bruits Resp: right basal crackles present Chest wall: no tenderness Cardio: S1 and S2 is normal.  2/6 to 3/6 systolic ejection murmur in the aortic and mitral area.  No gallop.  There is also a mid systolic murmur at the apex. GI: soft, non-tender; bowel sounds normal; no masses,  no organomegaly Extremities: extremities normal, atraumatic, no cyanosis or edema and Leg edema is essentially resolved.  Chronic venous stasis dermatitis present.    Lab Results: BMP Recent Labs    07/07/17 0818 07/08/17 0132 07/09/17 0452  NA 141 141 140  K 4.6 4.2 4.1  CL 110 110 109  CO2 20* 20* 21*  GLUCOSE 147* 84 89  BUN 49* 54* 60*  CREATININE 3.40* 3.40* 3.61*  CALCIUM 8.3* 8.6* 8.6*  GFRNONAA 16* 16* 15*  GFRAA 19* 19* 17*    CBC Recent Labs  Lab 07/07/17 0139   07/09/17 0452  WBC 14.4*   < > 10.2  RBC 3.02*   < > 2.58*  HGB 10.0*   < > 8.2*  HCT 30.1*   < > 25.6*  PLT 237   < > 215  MCV 99.7   < > 99.2  MCH 33.1   < > 31.8  MCHC 33.2   < > 32.0  RDW 17.9*   < > 18.3*  LYMPHSABS 1.8  --   --   MONOABS 2.6*  --   --   EOSABS 1.9*  --   --   BASOSABS 0.0  --   --    < > = values in this interval not displayed.    HEMOGLOBIN A1C Lab Results  Component Value Date   HGBA1C 7.3 (H) 10/18/2016   MPG 163 10/18/2016    Cardiac Panel (last 3 results) Recent Labs    07/07/17 0530 07/07/17 1405 07/07/17 2032  TROPONINI 0.29* 1.12* 1.71*    BNP (last 3 results) No results for input(s): PROBNP in the last 8760 hours.  TSH Recent Labs    10/18/16 1642  TSH 0.147*    Lipid Panel     Component Value Date/Time   CHOL 98 07/07/2017 2315   TRIG 49 07/07/2017 2315   HDL 34 (L) 07/07/2017 2315   CHOLHDL 2.9 07/07/2017 2315   VLDL 10 07/07/2017 2315   LDLCALC 54 07/07/2017 2315     Hepatic Function  Panel Recent Labs    04/20/17 1259 06/01/17 1017 06/22/17 1020  PROT 6.4 6.7 6.6  ALBUMIN 2.8* 3.0* 2.9*  AST 16 20 20   ALT 16 19 16   ALKPHOS 103* 110* 115*  BILITOT 0.60 0.5 0.6    Out Patient studies: Carotid artery duplex 01/04/2017: Stenosis in the right internal carotid artery (50-69%). Stenosis in the left internal carotid artery (16-49%). Homogenous plaque bilateral carotids.  Antegrade vertebral artery flow.  Compared to the study done on 05/10/2016, no significant change. Follow up in six months is appropriate if clinically indicated.  Inpatient studies, echocardiogram 07/07/2017: LV is normal in size, moderately reduced LVEF at 35-40% with mid to apical anteroseptal akinesis.  Grade 2 diastolic dysfunction.  Mild aortic stenosis, aortic valve area 1.34 cm.  Moderate MR, mild biatrial enlargement, moderate to severe pulmonary hypertension with PA pressure 62 mmHg.  Compared to 03/09/2016, EF has reduced from  55-60%, pulmonary hypertension new. Assessment/Plan:  1.  Non-STEMI involving the anterolateral wall 2.  Acute systolic and diastolic heart failure  3.  Diabetes mellitus type 2 controlled without hypoglycemia with stage IV chronic kidney disease 4.  Hypertension 5.  Hyperlipidemia 6.  Asymptomatic carotid artery stenosis. 7. 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  Recommendation: I have added BiDil 1 p.o. 3 times daily to the present medical regimen.  Will hold off on using ACE inhibitors or ARB in view of chronic renal insufficiency.  Continue IV diuresis for now.  Blood pressure is slightly elevated hopefully BiDil will improve the blood pressure.  Continue statins for now.  Carotid stenosis appears stable by recent duplex.  I will continue to follow.   Adrian Prows, M.D. 07/09/2017, 10:28 AM [facility] Pager: [phone] Office: [phone] If no answer: [phone]

## 2017-07-09 NOTE — Progress Notes (Signed)
Pt's o2 sats 94% on RA while ambulating.

## 2017-07-10 DIAGNOSIS — I214 Non-ST elevation (NSTEMI) myocardial infarction: Secondary | ICD-10-CM

## 2017-07-10 LAB — GLUCOSE, CAPILLARY
GLUCOSE-CAPILLARY: 93 mg/dL (ref 65–99)
GLUCOSE-CAPILLARY: 144 mg/dL — AB (ref 65–99)

## 2017-07-10 LAB — BASIC METABOLIC PANEL
ANION GAP: 11 (ref 5–15)
BUN: 65 mg/dL — AB (ref 6–20)
CHLORIDE: 109 mmol/L (ref 101–111)
CO2: 21 mmol/L — ABNORMAL LOW (ref 22–32)
Calcium: 8.3 mg/dL — ABNORMAL LOW (ref 8.9–10.3)
Creatinine, Ser: 4.01 mg/dL — ABNORMAL HIGH (ref 0.61–1.24)
GFR calc Af Amer: 15 mL/min — ABNORMAL LOW (ref >60)
GFR calc non Af Amer: 13 mL/min — ABNORMAL LOW (ref >60)
Glucose, Bld: 102 mg/dL — ABNORMAL HIGH (ref 65–99)
POTASSIUM: 4.1 mmol/L (ref 3.5–5.1)
SODIUM: 141 mmol/L (ref 135–145)

## 2017-07-10 LAB — BRAIN NATRIURETIC PEPTIDE: B NATRIURETIC PEPTIDE 5: 837.7 pg/mL — AB (ref 0.0–100.0)

## 2017-07-10 MED ORDER — ISOSORB DINITRATE-HYDRALAZINE 20-37.5 MG PO TABS: 2.0000 | ORAL_TABLET | Freq: Three times a day (TID) | ORAL | 0 refills | Status: AC

## 2017-07-10 MED ORDER — CARVEDILOL 6.25 MG PO TABS: 6.2500 mg | ORAL_TABLET | Freq: Two times a day (BID) | ORAL | 0 refills | Status: AC

## 2017-07-10 MED ORDER — TICAGRELOR 90 MG PO TABS: 90.0000 mg | ORAL_TABLET | Freq: Two times a day (BID) | ORAL | 0 refills | Status: AC

## 2017-07-10 NOTE — Discharge Summary (Signed)
Physician Discharge Summary  Larry [name] UXL:[id number] DOB: 1940/05/11 DOA: 07/07/2017  PCP: Gaynelle Arabian, MD  Admit date: 07/07/2017 Discharge date: 07/10/2017  Admitted From: home  Disposition:  home     Discharge Condition:  stable   CODE STATUS:  Full code  Consultations:  cardiology    Discharge Diagnoses:  Principal Problem:   Acute diastolic CHF (congestive heart failure) ([place]) Active Problems: NSTEMI   Diabetes mellitus ([place])   Essential hypertension   Chronic kidney disease, stage 5 ([place])   Closed fracture of one rib of left side   Chronic foley/ BPH   Subjective: No dyspnea at rest or with exertion today. No other complaints.  Brief Summary: Larry [name] is a 77 y.o.malewith medical history significant ofCKD stage 5, CHF, DM2, non-Hodgkins lymphoma in remission, BPH with chronic foley. Patient is on lasix 40mg  BID at baseline according to him.   He has noticed increasing shortness of breath over the past week and became suddenly short of breath yesterday. He has been taking his Lasix but was recently drinking more water and has noticed about a 4 lb increase in weight.    Hospital Course:  Principal Problem:   Acute diastolic CHF (congestive heart failure)    - he continues to feels much better - ECHO shows a drop in EF to 35-40% and grade 2 d CHF, mod-severe pulm HTN, mod MR - prior EF 50-55% with grade 2 d CHF - cardiology has switched him to oral Lasix 3/11, increased Coreg to 6.25 BIDand increased BiDil to TID    Active Problems:   NSTEMI - troponin has risen form 0.29 > 1.12> 1.71>> 1.58 - he has not had chest pain but acute onset dyspnea could have been an anginal equivalent - appreciate cardiology follow up - he has decided to avoid undergoing a cath to prevent progression to ESRD-  - he was placed on a Heparin infusion while in the hospital - will be medically manage at home with Coreg, Aspirin, Brillinta & Lipitor    Displaced  Left 7th rib fracture  - noted incidentally on CXR - he admits to a mechanical fall a couple of weeks ago - he states that a CXR at that time did not show any fractures- he currently has no discomfort from his fracture.    Diabetes mellitus 2 - on SSI- takes Januvia at home- sugars controlled while in hospital    Essential hypertension - cont Norvasc, Coreg and Doxazosin    Chronic kidney disease, stage 5  - follows as outpt with Dr Florene Glen  BPH with chronic foley - cont Proscar and Flomax- also on Doxzosin  Anemia of chronic disease - cont outpt follow up- Ferretin level is norma-  ? If he needs/is receiving Aranesp due to CKD5    Discharge Exam: Vitals:   07/10/17 0657 07/10/17 1140  BP: 116/66 (!) 93/49  Pulse: (!) 105 74  Resp: 17 18  Temp: 98.4 F (36.9 C) 97.6 F (36.4 C)  SpO2: 95% 94%   Vitals:   07/09/17 1123 07/09/17 2038 07/10/17 0657 07/10/17 1140  BP: (!) 118/56 (!) 128/55 116/66 (!) 93/49  Pulse: 86 76 (!) 105 74  Resp:  18 17 18   Temp:  97.8 F (36.6 C) 98.4 F (36.9 C) 97.6 F (36.4 C)  TempSrc:  Oral Oral Oral  SpO2: 94% 96% 95% 94%  Weight:   74.7 kg (164 lb 11.2 oz)   Height:  General: Pt is alert, awake, not in acute distress Cardiovascular: RRR, S1/S2 +, no rubs, no gallops Respiratory: CTA bilaterally, no wheezing, no rhonchi Abdominal: Soft, NT, ND, bowel sounds + Extremities: no edema, no cyanosis   Discharge Instructions  Discharge Instructions    Diet - low sodium heart healthy   Complete by:  As directed    Diet Carb Modified   Complete by:  As directed    Increase activity slowly   Complete by:  As directed      Allergies as of 07/10/2017      Reactions   Iohexol Other (See Comments)   Pt states that he was told by MD not to ever have contrast again.     Allopurinol Rash      Medication List    TAKE these medications   amLODipine 5 MG tablet Commonly known as:  NORVASC Take 5 mg by mouth daily.    aspirin 81 MG EC tablet Take 1 tablet (81 mg total) by mouth daily.   atorvastatin 10 MG tablet Commonly known as:  LIPITOR Take 10 mg by mouth at bedtime.   carvedilol 6.25 MG tablet Commonly known as:  COREG Take 1 tablet (6.25 mg total) by mouth 2 (two) times daily with a meal. What changed:    medication strength  how much to take   COLCRYS 0.6 MG tablet Generic drug:  colchicine Take 0.6 mg by mouth daily as needed (Gout).   doxazosin 8 MG tablet Commonly known as:  CARDURA Take 8 mg by mouth.   escitalopram 10 MG tablet Commonly known as:  LEXAPRO Take 1 tablet (10 mg total) by mouth daily.   finasteride 5 MG tablet Commonly known as:  PROSCAR Take 5 mg by mouth daily.   furosemide 80 MG tablet Commonly known as:  LASIX Take 80 mg by mouth 2 (two) times daily.   isosorbide-hydrALAZINE 20-37.5 MG tablet Commonly known as:  BIDIL Take 2 tablets by mouth 3 (three) times daily.   JANUVIA 50 MG tablet Generic drug:  sitaGLIPtin Take 25 mg by mouth daily.   levothyroxine 125 MCG tablet Commonly known as:  SYNTHROID, LEVOTHROID Take 125 mcg by mouth daily before breakfast.   omeprazole 20 MG capsule Commonly known as:  PRILOSEC Take 20 mg by mouth 2 (two) times daily.   tamsulosin 0.4 MG Caps capsule Commonly known as:  FLOMAX Take 0.4 mg by mouth daily after supper.   ticagrelor 90 MG Tabs tablet Commonly known as:  BRILINTA Take 1 tablet (90 mg total) by mouth 2 (two) times daily.      Follow-up Information    Gaynelle Arabian, MD .   Specialty:  Family Medicine Contact information: [place] [place] [address] [phone]        Miquel Dunn, NP Follow up on 07/17/2017.   Specialty:  Cardiology Why:  Follow up on 07/17/2017  Instructions: Come to office at 10:15 for f/u appointment at 10:30 AM. Bring all medications.    Contact information: [address] [place] [address] [phone]           Allergies  Allergen Reactions  . Iohexol Other (See Comments)    Pt states that he was told by MD not to ever have contrast again.    . Allopurinol Rash     Procedures/Studies: 2 D ECHO Study Conclusions  - Left ventricle: The cavity size was normal. Wall thickness was   normal. Systolic function  was moderately reduced. The estimated   ejection fraction was in the range of 35% to 40%. Akinesis of the   mid-apicalanteroseptal myocardium. Features are consistent with a   pseudonormal left ventricular filling pattern, with concomitant   abnormal relaxation and increased filling pressure (grade 2   diastolic dysfunction). - Aortic valve: There was mild stenosis. Valve area (VTI): 1.52   cm^2. Valve area (Vmax): 1.33 cm^2. Valve area (Vmean): 1.34   cm^2. - Mitral valve: There was moderate regurgitation. - Left atrium: The atrium was mildly dilated. - Right atrium: The atrium was mildly dilated. - Pulmonary arteries: Systolic pressure was moderately to severely   increased. PA peak pressure: 62 mm Hg (S). - Pericardium, extracardiac: A trivial pericardial effusion was   identified.   Dg Chest 2 View  Result Date: 07/07/2017 CLINICAL DATA:  Acute onset of shortness of breath. EXAM: CHEST - 2 VIEW COMPARISON:  Chest radiograph performed 05/23/2017 FINDINGS: There is a mildly displaced fracture of the left lateral seventh rib. Mild vascular congestion is noted. Peribronchial thickening is noted. A small left pleural effusion is noted. The heart is normal in size. No acute osseous abnormalities are identified. IMPRESSION: 1. Mildly displaced fracture of the left lateral seventh rib. 2. Mild vascular congestion and small left pleural effusion. Peribronchial thickening noted. This may reflect transient interstitial edema given left-sided rib fracture. Electronically Signed   By: Garald Balding M.D.   On: 07/07/2017 02:20     The results of significant diagnostics from this  hospitalization (including imaging, microbiology, ancillary and laboratory) are listed below for reference.     Microbiology: No results found for this or any previous visit (from the past 240 hour(s)).   Labs: BNP (last 3 results) Recent Labs    07/08/17 0132 07/09/17 0452 07/10/17 0429  BNP 1,813.2* 925.2* 948.5*   Basic Metabolic Panel: Recent Labs  Lab 07/07/17 0139 07/07/17 0818 07/08/17 0132 07/09/17 0452 07/10/17 0429  NA 142 141 141 140 141  K 4.6 4.6 4.2 4.1 4.1  CL 110 110 110 109 109  CO2 19* 20* 20* 21* 21*  GLUCOSE 172* 147* 84 89 102*  BUN 49* 49* 54* 60* 65*  CREATININE 3.49* 3.40* 3.40* 3.61* 4.01*  CALCIUM 8.2* 8.3* 8.6* 8.6* 8.3*   Liver Function Tests: No results for input(s): AST, ALT, ALKPHOS, BILITOT, PROT, ALBUMIN in the last 168 hours. No results for input(s): LIPASE, AMYLASE in the last 168 hours. No results for input(s): AMMONIA in the last 168 hours. CBC: Recent Labs  Lab 07/07/17 0139 07/07/17 0818 07/08/17 0132 07/09/17 0452  WBC 14.4* 14.7* 12.0* 10.2  NEUTROABS 8.0*  --   --   --   HGB 10.0* 9.6* 8.8* 8.2*  HCT 30.1* 30.0* 26.2* 25.6*  MCV 99.7 101.0* 100.0 99.2  PLT 237 211 209 215   Cardiac Enzymes: Recent Labs  Lab 07/07/17 0530 07/07/17 1405 07/07/17 2032 07/09/17 1037  TROPONINI 0.29* 1.12* 1.71* 1.58*   BNP: Invalid input(s): POCBNP CBG: Recent Labs  Lab 07/09/17 0755 07/09/17 1122 07/09/17 1632 07/09/17 2143 07/10/17 0731  GLUCAP 82 178* 103* 122* 93   D-Dimer No results for input(s): DDIMER in the last 72 hours. Hgb A1c No results for input(s): HGBA1C in the last 72 hours. Lipid Profile Recent Labs    07/07/17 2315  CHOL 98  HDL 34*  LDLCALC 54  TRIG 49  CHOLHDL 2.9   Thyroid function studies No results for input(s): TSH, T4TOTAL, T3FREE, THYROIDAB in the  last 72 hours.  Invalid input(s): FREET3 Anemia work up No results for input(s): VITAMINB12, FOLATE, FERRITIN, TIBC, IRON, RETICCTPCT in  the last 72 hours. Urinalysis    Component Value Date/Time   COLORURINE YELLOW 03/07/2016 1535   APPEARANCEUR CLEAR 03/07/2016 1535   LABSPEC 1.015 03/07/2016 1535   PHURINE 6.0 03/07/2016 1535   GLUCOSEU NEGATIVE 03/07/2016 1535   HGBUR SMALL (A) 03/07/2016 1535   BILIRUBINUR NEGATIVE 03/07/2016 1535   KETONESUR NEGATIVE 03/07/2016 1535   PROTEINUR >300 (A) 03/07/2016 1535   UROBILINOGEN 0.2 08/11/2011 1050   NITRITE NEGATIVE 03/07/2016 1535   LEUKOCYTESUR NEGATIVE 03/07/2016 1535   Sepsis Labs Invalid input(s): PROCALCITONIN,  WBC,  LACTICIDVEN Microbiology No results found for this or any previous visit (from the past 240 hour(s)).   Time coordinating discharge: Over 30 minutes  SIGNED:   Debbe Odea, MD  [facility] 07/10/2017, 12:29 PM Pager   If 7PM-7AM, please contact night-coverage [email or web address] Password [email or web address]

## 2017-07-10 NOTE — Progress Notes (Signed)
Subjective:  He feels much better.  He has ambulated in the hallway.  He was able to sleep better with no further orthopnea.  Objective:  Vital Signs in the last 24 hours: Temp:  [97.8 F (36.6 C)-98.4 F (36.9 C)] 98.4 F (36.9 C) (03/12 0657) Pulse Rate:  [76-105] 105 (03/12 0657) Resp:  [17-18] 17 (03/12 0657) BP: (116-128)/(55-66) 116/66 (03/12 0657) SpO2:  [94 %-96 %] 95 % (03/12 0657) Weight:  [74.7 kg (164 lb 11.2 oz)] 74.7 kg (164 lb 11.2 oz) (03/12 0657)  Intake/Output from previous day: 03/11 0701 - 03/12 0700 In: 480 [P.O.:480] Out: 925 [Urine:925]  Physical Exam:  Blood pressure (!) 149/63, pulse 84, temperature 98 F (36.7 C), temperature source Oral, resp. rate 18, height 5\' 6"  (1.676 m), weight 75.4 kg (166 lb 3.2 oz), SpO2 95 %.  General appearance: alert, cooperative, appears stated age and no distress Eyes: negative findings: lids and lashes normal Neck: no adenopathy, no carotid bruit, supple, symmetrical, trachea midline, thyroid not enlarged, symmetric, no tenderness/mass/nodules and JVD present upto the angle of jaw Neck: JVP -elevated just above the sternoclavicular joint, carotids 2+= bilateral soft bruit present Resp:  faint right basilar crackles Chest wall: no tenderness Cardio: S1 and S2 is normal.  2/6 to 3/6 systolic ejection murmur in the aortic and mitral area.  No gallop.  There is also a mid systolic murmur at the apex. GI: soft, non-tender; bowel sounds normal; no masses,  no organomegaly Extremities: extremities normal, atraumatic, no cyanosis or edema and Leg edema is essentially resolved except at the ankles.  Chronic venous stasis dermatitis present.  Lab Results: BMP Recent Labs    07/08/17 0132 07/09/17 0452 07/10/17 0429  NA 141 140 141  K 4.2 4.1 4.1  CL 110 109 109  CO2 20* 21* 21*  GLUCOSE 84 89 102*  BUN 54* 60* 65*  CREATININE 3.40* 3.61* 4.01*  CALCIUM 8.6* 8.6* 8.3*  GFRNONAA 16* 15* 13*  GFRAA 19* 17* 15*     CBC Recent Labs  Lab 07/07/17 0139  07/09/17 0452  WBC 14.4*   < > 10.2  RBC 3.02*   < > 2.58*  HGB 10.0*   < > 8.2*  HCT 30.1*   < > 25.6*  PLT 237   < > 215  MCV 99.7   < > 99.2  MCH 33.1   < > 31.8  MCHC 33.2   < > 32.0  RDW 17.9*   < > 18.3*  LYMPHSABS 1.8  --   --   MONOABS 2.6*  --   --   EOSABS 1.9*  --   --   BASOSABS 0.0  --   --    < > = values in this interval not displayed.    HEMOGLOBIN A1C Lab Results  Component Value Date   HGBA1C 7.3 (H) 10/18/2016   MPG 163 10/18/2016    Cardiac Panel (last 3 results) Recent Labs    07/07/17 1405 07/07/17 2032 07/09/17 1037  TROPONINI 1.12* 1.71* 1.58*   BNP    Component Value Date/Time   BNP 837.7 (H) 07/10/2017 0429   Lipid Panel     Component Value Date/Time   CHOL 98 07/07/2017 2315   TRIG 49 07/07/2017 2315   HDL 34 (L) 07/07/2017 2315   CHOLHDL 2.9 07/07/2017 2315   VLDL 10 07/07/2017 2315   LDLCALC 54 07/07/2017 2315     Hepatic Function Panel Recent Labs    04/20/17 1259  06/01/17 1017 06/22/17 1020  PROT 6.4 6.7 6.6  ALBUMIN 2.8* 3.0* 2.9*  AST 16 20 20   ALT 16 19 16   ALKPHOS 103* 110* 115*  BILITOT 0.60 0.5 0.6   Scheduled Meds: . amLODipine  5 mg Oral Daily  . aspirin EC  81 mg Oral Daily  . atorvastatin  80 mg Oral QHS  . carvedilol  6.25 mg Oral BID WC  . doxazosin  8 mg Oral Daily  . escitalopram  10 mg Oral Daily  . finasteride  5 mg Oral Daily  . furosemide  80 mg Oral BID  . insulin aspart  0-9 Units Subcutaneous TID WC  . isosorbide-hydrALAZINE  2 tablet Oral TID  . levothyroxine  125 mcg Oral QAC breakfast  . pantoprazole  40 mg Oral Daily  . sodium chloride flush  3 mL Intravenous Q12H  . tamsulosin  0.4 mg Oral QPC supper  . ticagrelor  90 mg Oral BID  . triamcinolone cream   Topical BID   Continuous Infusions: . sodium chloride     PRN Meds:.sodium chloride, acetaminophen, ondansetron (ZOFRAN) IV, sodium chloride flush  Out Patient studies: Carotid  artery duplex 01/04/2017: Stenosis in the right internal carotid artery (50-69%). Stenosis in the left internal carotid artery (16-49%). Homogenous plaque bilateral carotids.  Antegrade vertebral artery flow.  Compared to the study done on 05/10/2016, no significant change. Follow up in six months is appropriate if clinically indicated.  Inpatient studies, echocardiogram 07/07/2017:  LV is normal in size, moderately reduced LVEF at 35-40% with mid to apical anteroseptal akinesis. Grade 2 diastolic dysfunction. Mild aortic stenosis, aortic valve area 1.34 cm. Moderate MR, mild biatrial enlargement, moderate to severe pulmonary hypertension with PA pressure 62 mmHg.Compared to 03/09/2016, EF has reduced from 55-60%, pulmonary hypertension new.  Assessment/Plan:  1. Non-STEMI involving the anterolateral wall 2. Acute systolic and diastolic heart failure 3. Diabetes mellitus type 2 uncontrolled without hypoglycemia with stage IV chronic kidney disease, A1c 7.2 4. Hypertension 5. Hyperlipidemia 6. Asymptomatic carotid artery stenosis. 7. 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 8.  Anemia of chronic disease, hemoglobin is stabilized.  Did drop initially.  Recommendation: No CHF on exam. BNP has trended down. Continue BiDil  2 tablets 3 times daily. Coreg to 6.25 mg p.o. twice daily.  Lipids are being treated with Lipitor.  Patient appears to be slightly iron deficient although has chronic anemia from stage IV chronic kidney disease.  Consider IV infusion of iron if appropriate along with consideration for Aranesp and will inform Nephrology regarding this.  Continue p.o. Lasix  twice daily in view of CRF stage 4.  I think he can be discharged today.  With regard to non-ST elevation MI, patient prefers medical therapy in view of high risk for developing ESRD.  Continue Brilinta.  Adrian Prows, M.D. 07/10/2017, 11:16 AM [facility] Pager: [phone] Office:  [phone] If no answer: [phone]

## 2017-07-10 NOTE — Progress Notes (Addendum)
Patient to go home on Brilinta; coupon card given to patient with explanation of usage. [facility] [phone]   RE: Benefit check  Received: Today  Message Contents  Memory Argue CMA        # 10.  Jamelle Haring [facility] @ [facility] CHOICE RX # [phone]    BRILINTA  90 MG BID  COVER- YES  CO-PAY- $ 45.00  TIER- 3 DRUG  PRIOR APPROVAL- NO   PREFERRED PHARMACY : [facility] AND [facility]

## 2017-07-17 DIAGNOSIS — R55 Syncope and collapse: Secondary | ICD-10-CM | POA: Diagnosis not present

## 2017-07-17 DIAGNOSIS — I10 Essential (primary) hypertension: Secondary | ICD-10-CM | POA: Diagnosis not present

## 2017-07-17 DIAGNOSIS — I252 Old myocardial infarction: Secondary | ICD-10-CM | POA: Diagnosis not present

## 2017-07-17 DIAGNOSIS — I5043 Acute on chronic combined systolic (congestive) and diastolic (congestive) heart failure: Secondary | ICD-10-CM | POA: Diagnosis not present

## 2017-07-18 DIAGNOSIS — R338 Other retention of urine: Secondary | ICD-10-CM | POA: Diagnosis not present

## 2017-07-19 DIAGNOSIS — K219 Gastro-esophageal reflux disease without esophagitis: Secondary | ICD-10-CM | POA: Diagnosis not present

## 2017-07-19 DIAGNOSIS — N320 Bladder-neck obstruction: Secondary | ICD-10-CM | POA: Diagnosis not present

## 2017-07-19 DIAGNOSIS — E785 Hyperlipidemia, unspecified: Secondary | ICD-10-CM | POA: Diagnosis not present

## 2017-07-19 DIAGNOSIS — M79606 Pain in leg, unspecified: Secondary | ICD-10-CM | POA: Diagnosis not present

## 2017-07-19 DIAGNOSIS — M1A09X0 Idiopathic chronic gout, multiple sites, without tophus (tophi): Secondary | ICD-10-CM | POA: Diagnosis not present

## 2017-07-19 DIAGNOSIS — N185 Chronic kidney disease, stage 5: Secondary | ICD-10-CM | POA: Diagnosis not present

## 2017-07-19 DIAGNOSIS — I779 Disorder of arteries and arterioles, unspecified: Secondary | ICD-10-CM | POA: Diagnosis not present

## 2017-07-19 DIAGNOSIS — E039 Hypothyroidism, unspecified: Secondary | ICD-10-CM | POA: Diagnosis not present

## 2017-07-19 DIAGNOSIS — R454 Irritability and anger: Secondary | ICD-10-CM | POA: Diagnosis not present

## 2017-07-19 DIAGNOSIS — I10 Essential (primary) hypertension: Secondary | ICD-10-CM | POA: Diagnosis not present

## 2017-07-19 DIAGNOSIS — E1121 Type 2 diabetes mellitus with diabetic nephropathy: Secondary | ICD-10-CM | POA: Diagnosis not present

## 2017-07-19 DIAGNOSIS — I5032 Chronic diastolic (congestive) heart failure: Secondary | ICD-10-CM | POA: Diagnosis not present

## 2017-07-20 DIAGNOSIS — K219 Gastro-esophageal reflux disease without esophagitis: Secondary | ICD-10-CM | POA: Diagnosis not present

## 2017-07-20 DIAGNOSIS — E291 Testicular hypofunction: Secondary | ICD-10-CM | POA: Diagnosis not present

## 2017-07-20 DIAGNOSIS — E785 Hyperlipidemia, unspecified: Secondary | ICD-10-CM | POA: Diagnosis not present

## 2017-07-20 DIAGNOSIS — E875 Hyperkalemia: Secondary | ICD-10-CM | POA: Diagnosis not present

## 2017-07-20 DIAGNOSIS — I10 Essential (primary) hypertension: Secondary | ICD-10-CM | POA: Diagnosis not present

## 2017-07-20 DIAGNOSIS — M109 Gout, unspecified: Secondary | ICD-10-CM | POA: Diagnosis not present

## 2017-07-20 DIAGNOSIS — E039 Hypothyroidism, unspecified: Secondary | ICD-10-CM | POA: Diagnosis not present

## 2017-07-20 DIAGNOSIS — I509 Heart failure, unspecified: Secondary | ICD-10-CM | POA: Diagnosis not present

## 2017-07-20 DIAGNOSIS — E1159 Type 2 diabetes mellitus with other circulatory complications: Secondary | ICD-10-CM | POA: Diagnosis not present

## 2017-07-20 DIAGNOSIS — Z8572 Personal history of non-Hodgkin lymphomas: Secondary | ICD-10-CM | POA: Diagnosis not present

## 2017-07-20 DIAGNOSIS — E8770 Fluid overload, unspecified: Secondary | ICD-10-CM | POA: Diagnosis not present

## 2017-07-20 DIAGNOSIS — D649 Anemia, unspecified: Secondary | ICD-10-CM | POA: Diagnosis not present

## 2017-07-20 DIAGNOSIS — N185 Chronic kidney disease, stage 5: Secondary | ICD-10-CM | POA: Diagnosis not present

## 2017-07-20 DIAGNOSIS — I219 Acute myocardial infarction, unspecified: Secondary | ICD-10-CM | POA: Diagnosis not present

## 2017-07-20 DIAGNOSIS — R0601 Orthopnea: Secondary | ICD-10-CM | POA: Diagnosis not present

## 2017-07-20 DIAGNOSIS — I25119 Atherosclerotic heart disease of native coronary artery with unspecified angina pectoris: Secondary | ICD-10-CM | POA: Diagnosis not present

## 2017-07-20 DIAGNOSIS — F339 Major depressive disorder, recurrent, unspecified: Secondary | ICD-10-CM | POA: Diagnosis not present

## 2017-07-21 DIAGNOSIS — N185 Chronic kidney disease, stage 5: Secondary | ICD-10-CM | POA: Diagnosis not present

## 2017-07-21 DIAGNOSIS — I219 Acute myocardial infarction, unspecified: Secondary | ICD-10-CM | POA: Diagnosis not present

## 2017-07-21 DIAGNOSIS — E8770 Fluid overload, unspecified: Secondary | ICD-10-CM | POA: Diagnosis not present

## 2017-07-21 DIAGNOSIS — R0601 Orthopnea: Secondary | ICD-10-CM | POA: Diagnosis not present

## 2017-07-21 DIAGNOSIS — I25119 Atherosclerotic heart disease of native coronary artery with unspecified angina pectoris: Secondary | ICD-10-CM | POA: Diagnosis not present

## 2017-07-21 DIAGNOSIS — I509 Heart failure, unspecified: Secondary | ICD-10-CM | POA: Diagnosis not present

## 2017-07-22 DIAGNOSIS — N185 Chronic kidney disease, stage 5: Secondary | ICD-10-CM | POA: Diagnosis not present

## 2017-07-22 DIAGNOSIS — E8770 Fluid overload, unspecified: Secondary | ICD-10-CM | POA: Diagnosis not present

## 2017-07-22 DIAGNOSIS — I219 Acute myocardial infarction, unspecified: Secondary | ICD-10-CM | POA: Diagnosis not present

## 2017-07-22 DIAGNOSIS — I509 Heart failure, unspecified: Secondary | ICD-10-CM | POA: Diagnosis not present

## 2017-07-22 DIAGNOSIS — I25119 Atherosclerotic heart disease of native coronary artery with unspecified angina pectoris: Secondary | ICD-10-CM | POA: Diagnosis not present

## 2017-07-22 DIAGNOSIS — R0601 Orthopnea: Secondary | ICD-10-CM | POA: Diagnosis not present

## 2017-07-23 DIAGNOSIS — I219 Acute myocardial infarction, unspecified: Secondary | ICD-10-CM | POA: Diagnosis not present

## 2017-07-23 DIAGNOSIS — I25119 Atherosclerotic heart disease of native coronary artery with unspecified angina pectoris: Secondary | ICD-10-CM | POA: Diagnosis not present

## 2017-07-23 DIAGNOSIS — I509 Heart failure, unspecified: Secondary | ICD-10-CM | POA: Diagnosis not present

## 2017-07-23 DIAGNOSIS — N185 Chronic kidney disease, stage 5: Secondary | ICD-10-CM | POA: Diagnosis not present

## 2017-07-23 DIAGNOSIS — R0601 Orthopnea: Secondary | ICD-10-CM | POA: Diagnosis not present

## 2017-07-23 DIAGNOSIS — E8770 Fluid overload, unspecified: Secondary | ICD-10-CM | POA: Diagnosis not present

## 2017-07-24 DIAGNOSIS — I509 Heart failure, unspecified: Secondary | ICD-10-CM | POA: Diagnosis not present

## 2017-07-24 DIAGNOSIS — I25119 Atherosclerotic heart disease of native coronary artery with unspecified angina pectoris: Secondary | ICD-10-CM | POA: Diagnosis not present

## 2017-07-24 DIAGNOSIS — R0601 Orthopnea: Secondary | ICD-10-CM | POA: Diagnosis not present

## 2017-07-24 DIAGNOSIS — E8770 Fluid overload, unspecified: Secondary | ICD-10-CM | POA: Diagnosis not present

## 2017-07-24 DIAGNOSIS — I219 Acute myocardial infarction, unspecified: Secondary | ICD-10-CM | POA: Diagnosis not present

## 2017-07-24 DIAGNOSIS — N185 Chronic kidney disease, stage 5: Secondary | ICD-10-CM | POA: Diagnosis not present

## 2017-07-25 DIAGNOSIS — N185 Chronic kidney disease, stage 5: Secondary | ICD-10-CM | POA: Diagnosis not present

## 2017-07-25 DIAGNOSIS — I509 Heart failure, unspecified: Secondary | ICD-10-CM | POA: Diagnosis not present

## 2017-07-25 DIAGNOSIS — I25119 Atherosclerotic heart disease of native coronary artery with unspecified angina pectoris: Secondary | ICD-10-CM | POA: Diagnosis not present

## 2017-07-25 DIAGNOSIS — E8770 Fluid overload, unspecified: Secondary | ICD-10-CM | POA: Diagnosis not present

## 2017-07-25 DIAGNOSIS — R0601 Orthopnea: Secondary | ICD-10-CM | POA: Diagnosis not present

## 2017-07-25 DIAGNOSIS — I219 Acute myocardial infarction, unspecified: Secondary | ICD-10-CM | POA: Diagnosis not present

## 2017-07-26 DIAGNOSIS — I219 Acute myocardial infarction, unspecified: Secondary | ICD-10-CM | POA: Diagnosis not present

## 2017-07-26 DIAGNOSIS — R0601 Orthopnea: Secondary | ICD-10-CM | POA: Diagnosis not present

## 2017-07-26 DIAGNOSIS — N185 Chronic kidney disease, stage 5: Secondary | ICD-10-CM | POA: Diagnosis not present

## 2017-07-26 DIAGNOSIS — E8770 Fluid overload, unspecified: Secondary | ICD-10-CM | POA: Diagnosis not present

## 2017-07-26 DIAGNOSIS — I25119 Atherosclerotic heart disease of native coronary artery with unspecified angina pectoris: Secondary | ICD-10-CM | POA: Diagnosis not present

## 2017-07-26 DIAGNOSIS — I509 Heart failure, unspecified: Secondary | ICD-10-CM | POA: Diagnosis not present

## 2017-07-27 DIAGNOSIS — N185 Chronic kidney disease, stage 5: Secondary | ICD-10-CM | POA: Diagnosis not present

## 2017-07-27 DIAGNOSIS — I25119 Atherosclerotic heart disease of native coronary artery with unspecified angina pectoris: Secondary | ICD-10-CM | POA: Diagnosis not present

## 2017-07-27 DIAGNOSIS — I509 Heart failure, unspecified: Secondary | ICD-10-CM | POA: Diagnosis not present

## 2017-07-27 DIAGNOSIS — R0601 Orthopnea: Secondary | ICD-10-CM | POA: Diagnosis not present

## 2017-07-27 DIAGNOSIS — I219 Acute myocardial infarction, unspecified: Secondary | ICD-10-CM | POA: Diagnosis not present

## 2017-07-27 DIAGNOSIS — E8770 Fluid overload, unspecified: Secondary | ICD-10-CM | POA: Diagnosis not present

## 2017-07-28 DIAGNOSIS — I509 Heart failure, unspecified: Secondary | ICD-10-CM | POA: Diagnosis not present

## 2017-07-28 DIAGNOSIS — I219 Acute myocardial infarction, unspecified: Secondary | ICD-10-CM | POA: Diagnosis not present

## 2017-07-28 DIAGNOSIS — R0601 Orthopnea: Secondary | ICD-10-CM | POA: Diagnosis not present

## 2017-07-28 DIAGNOSIS — I25119 Atherosclerotic heart disease of native coronary artery with unspecified angina pectoris: Secondary | ICD-10-CM | POA: Diagnosis not present

## 2017-07-28 DIAGNOSIS — N185 Chronic kidney disease, stage 5: Secondary | ICD-10-CM | POA: Diagnosis not present

## 2017-07-28 DIAGNOSIS — E8770 Fluid overload, unspecified: Secondary | ICD-10-CM | POA: Diagnosis not present

## 2017-07-30 DIAGNOSIS — 419620001 Death: Secondary | SNOMED CT | POA: Diagnosis not present

## 2017-07-30 DEATH — deceased

## 2017-08-02 ENCOUNTER — Encounter: Payer: Self-pay | Admitting: Nephrology

## 2017-08-21 ENCOUNTER — Ambulatory Visit: Payer: Medicare Other | Admitting: Hematology & Oncology

## 2017-08-21 ENCOUNTER — Ambulatory Visit: Payer: Medicare Other

## 2017-08-21 ENCOUNTER — Other Ambulatory Visit: Payer: Medicare Other

## 2018-06-25 IMAGING — CR DG CHEST 2V
2 series · 2 of 2 positions shown · non-contrast
Comparison: Chest x-ray of March 07, 2016

CLINICAL DATA: Weakness for the past 2 weeks.  Nonsmoker.

EXAM:
CHEST  2 VIEW

[w chest pa]
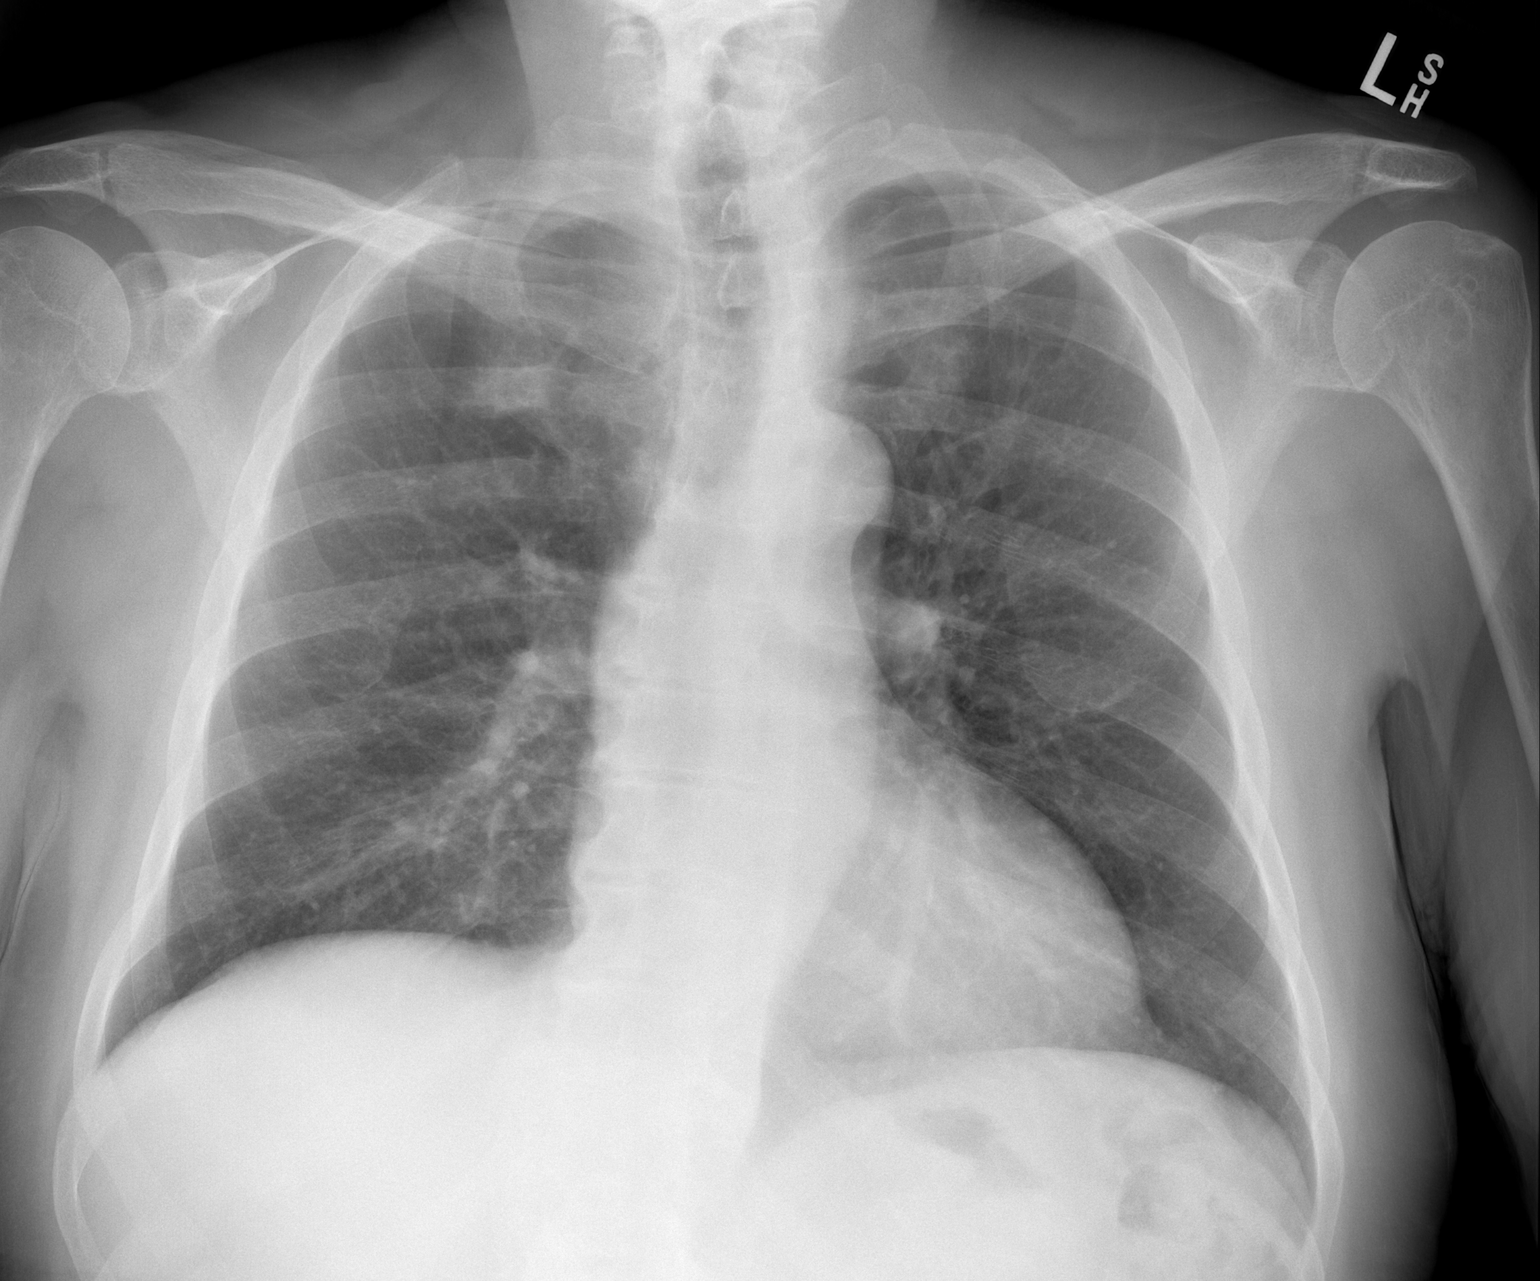

[w chest lat]
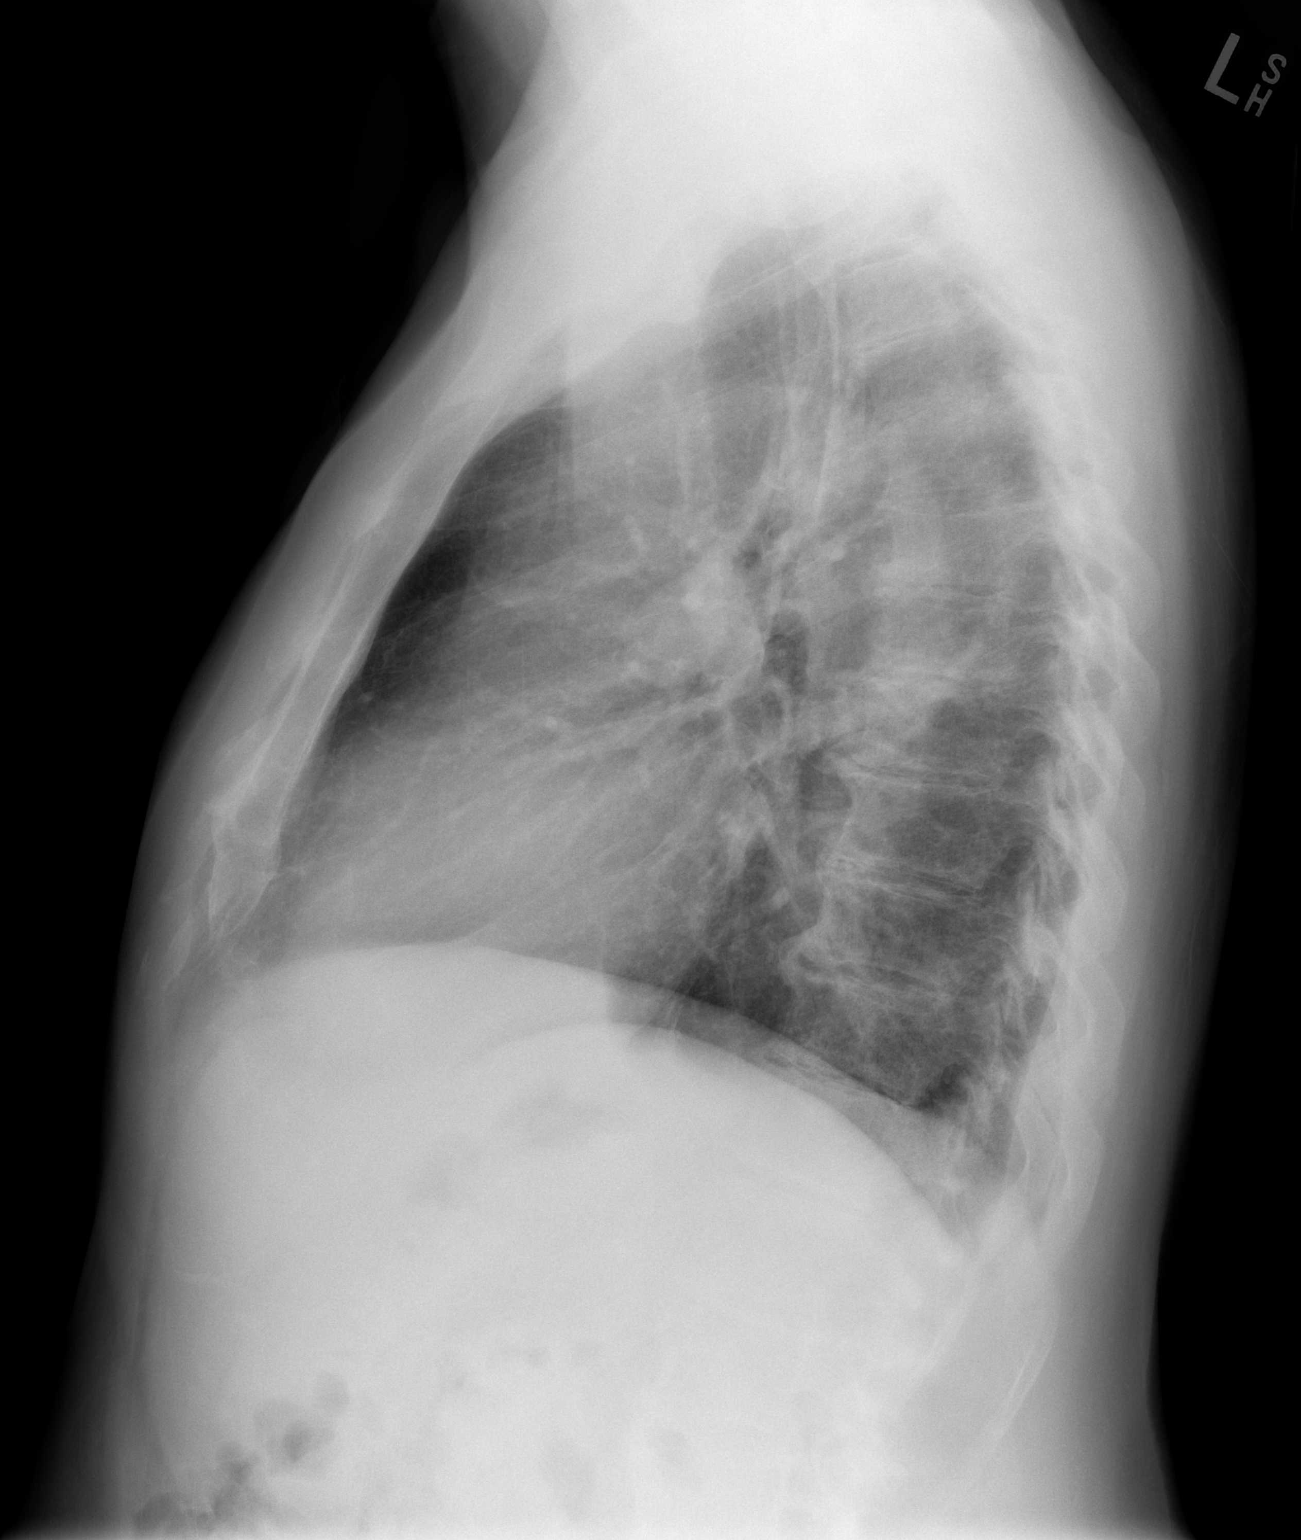

[2 of 2 positions shown; findings below may reference images not displayed]

FINDINGS: The lungs are adequately inflated. There is subtle increased density
that projects in the right upper lobe at or just below the first
costosternal junction. This is new since the previous study. There
is no pleural effusion. The heart and pulmonary vascularity are
normal. The mediastinum is normal in width. There is multilevel
degenerative disc disease of the thoracic spine.
IMPRESSION: Increased density in the right upper lobe possibly related to
overlap of bony structures, but it is worrisome for infiltrate or a
parenchymal mass. Further evaluation with chest CT scanning would be
useful to exclude malignancy.

Elsewhere no acute intrathoracic abnormality is observed.

## 2018-06-26 IMAGING — CT CT ABD-PELV W/O CM
2 of 4 series · 12 of 36 positions shown, 15 images · non-contrast
Comparison: Chest abdominopelvic CT 06/01/2010. Abdominopelvic CT
08/11/2011.

CLINICAL DATA: Increased density projecting over the upper right
chest radiographs. Weakness. History of non-Hodgkin's lymphoma,
diabetes, chronic kidney disease and congestive heart failure.

EXAM:
CT CHEST, ABDOMEN AND PELVIS WITHOUT CONTRAST
TECHNIQUE: Multidetector CT imaging of the chest, abdomen and pelvis was
performed following the standard protocol without IV contrast.

[Series 3: cap w/o · axial · non-contrast · 0.73mm/px · z∈[+999,+1504]mm · 9 of 123 slices shown, 12 images]
[im 11/123  mediastinal]
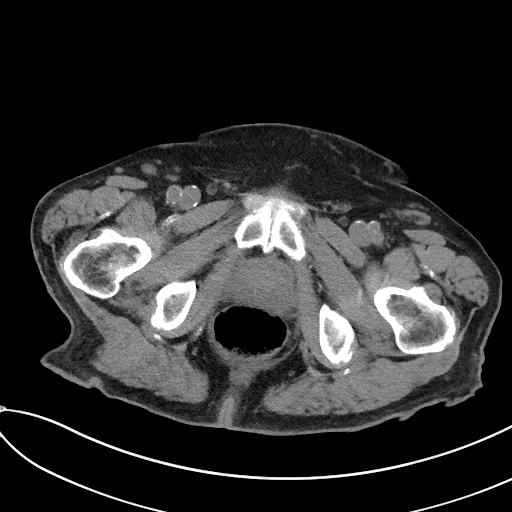
[im 11/123  lung]
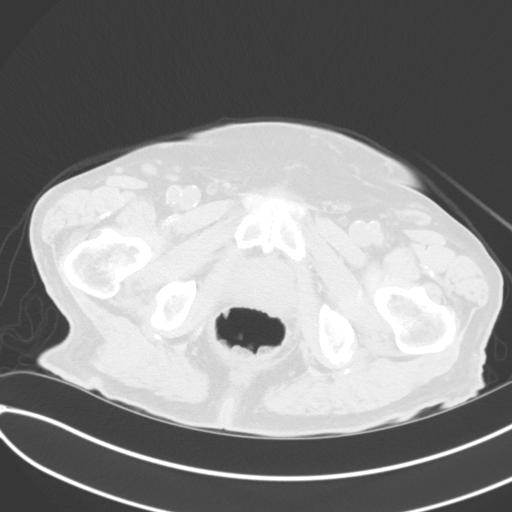
[im 21/123  lung]
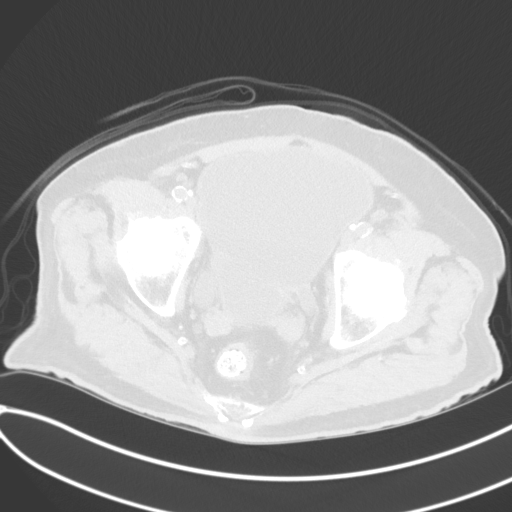
[im 41/123  lung]
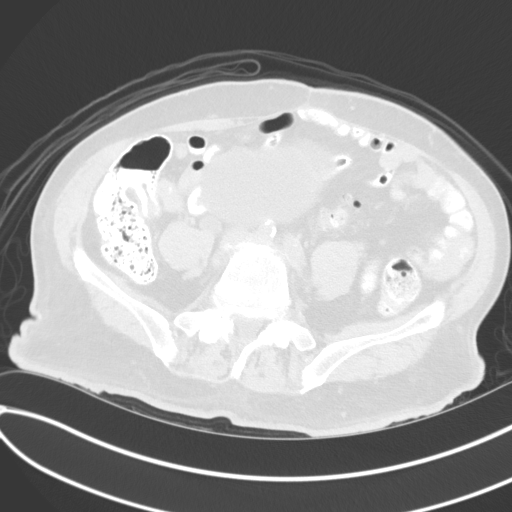
[im 51/123  lung]
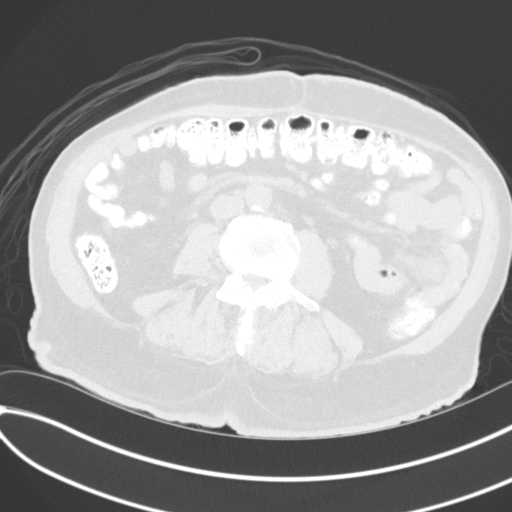
[im 62/123  mediastinal]
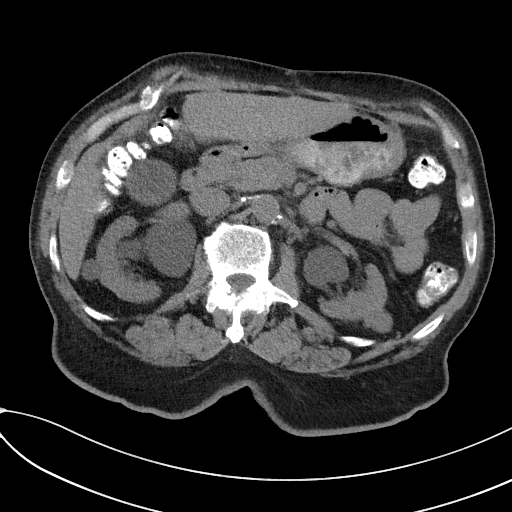
[im 62/123  lung]
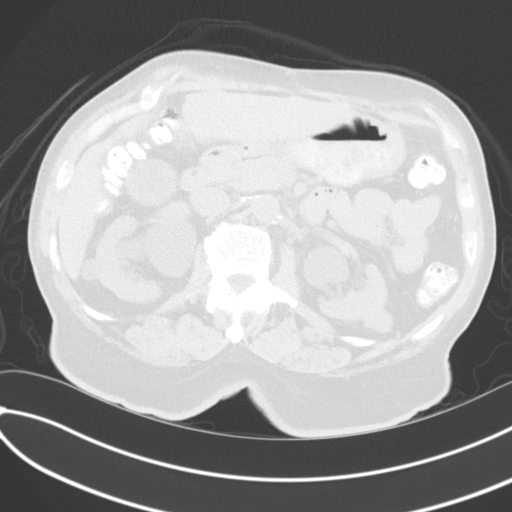
[im 72/123  lung]
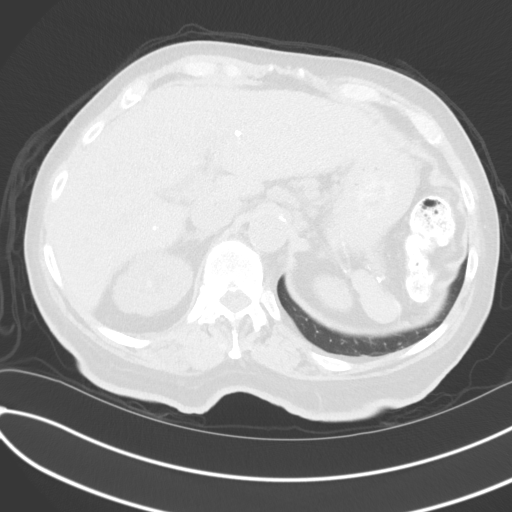
[im 82/123  lung]
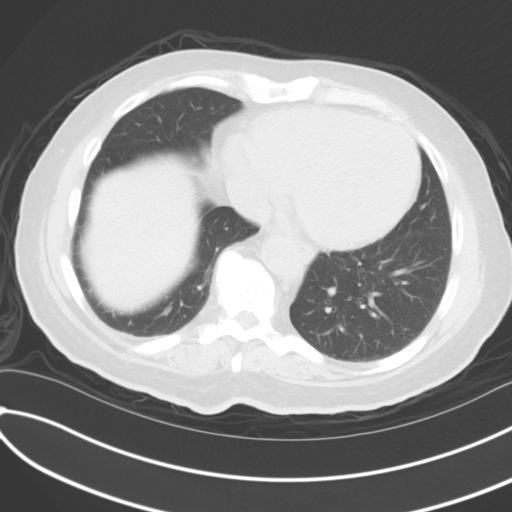
[im 102/123  lung]
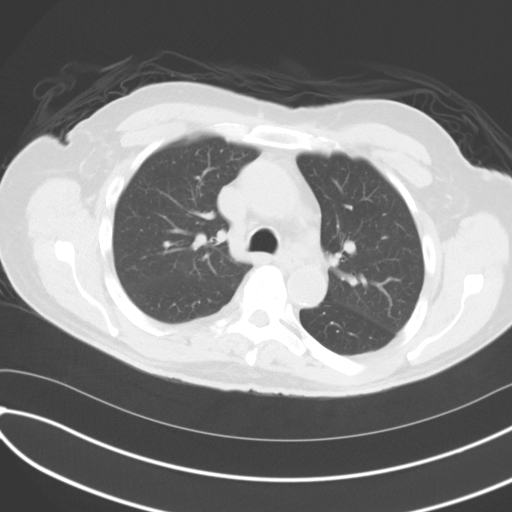
[im 112/123  mediastinal]
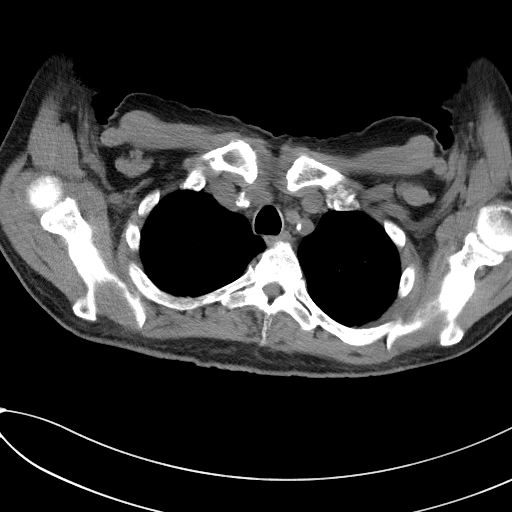
[im 112/123  lung]
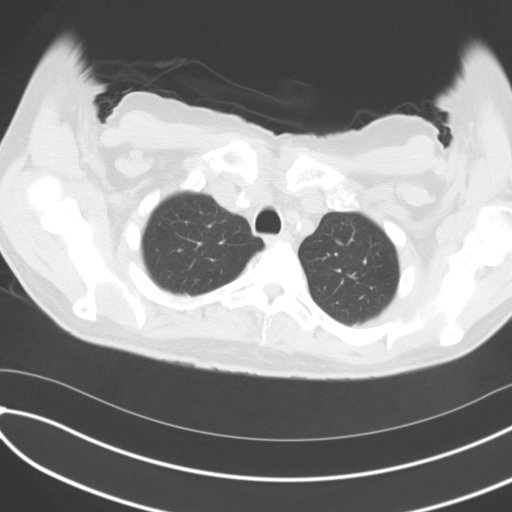

[Series 6: coronals · coronal · 0.74mm/px · 3 of 137 slices shown]
[im 28/137  lung]
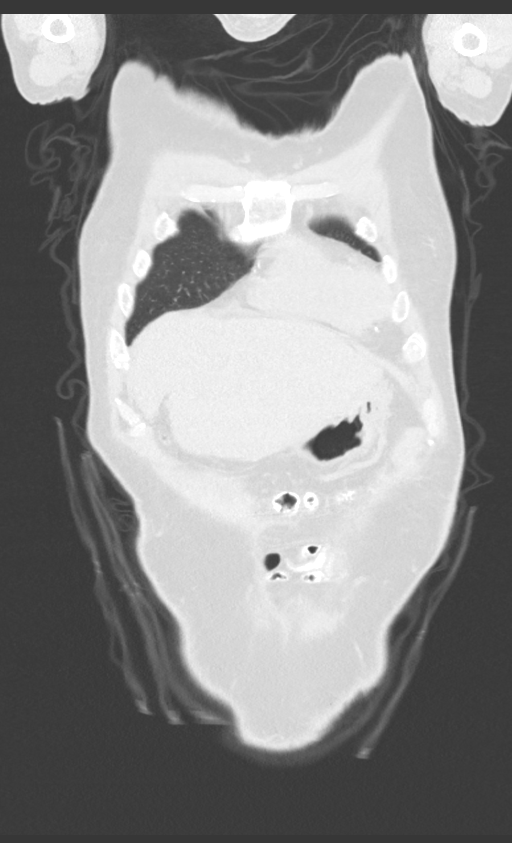
[im 55/137  lung]
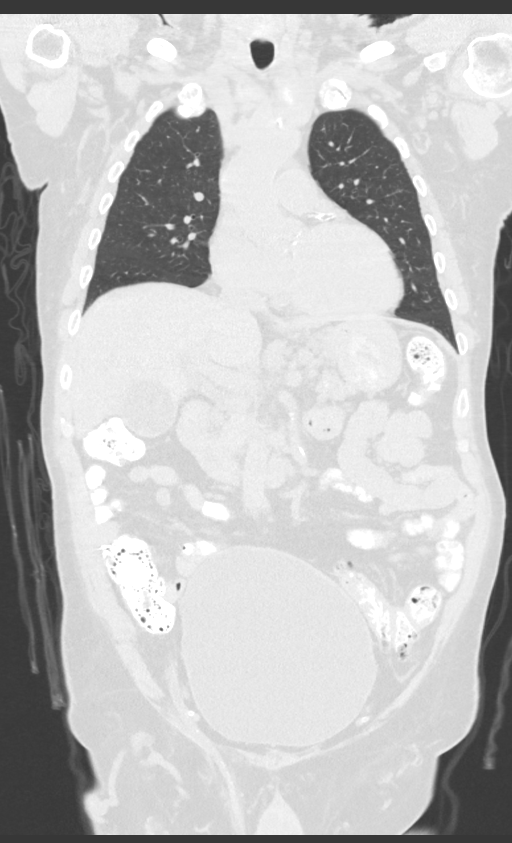
[im 82/137  lung]
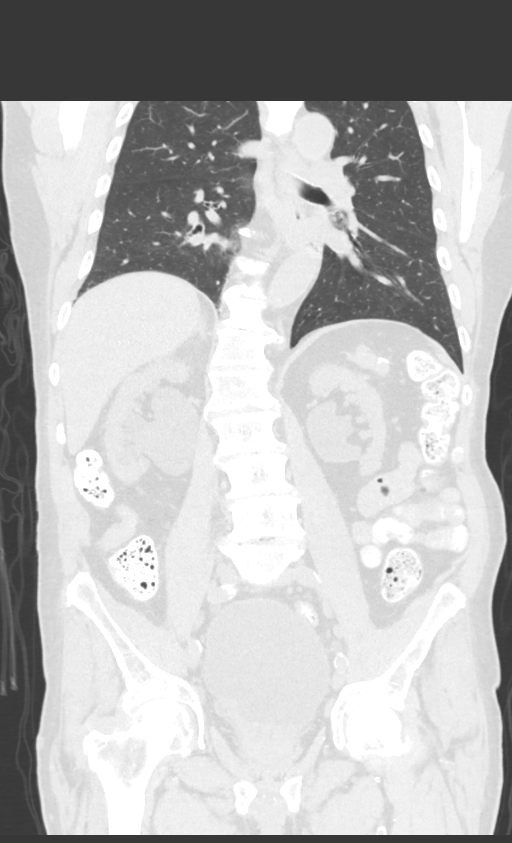

[12 of 36 positions shown; findings below may reference images not displayed]

FINDINGS: CT CHEST FINDINGS

Cardiovascular: Atherosclerosis of the aorta, great vessels and
coronary arteries. Possible aortic valvular calcifications. The
heart size is normal. There is no pericardial effusion.

Mediastinum/Nodes: There are no enlarged mediastinal, hilar or
axillary lymph nodes. Hilar assessment is limited by the lack of
intravenous contrast, although the hilar contours appear unchanged.
The trachea, esophagus and thyroid gland demonstrate no significant
findings.

Lungs/Pleura: There is no pleural effusion. The lungs are clear.
Specifically, no evidence of right upper lobe infiltrate or nodule.
Previously questioned density appears osseous.

Musculoskeletal/Chest wall: No chest wall mass or suspicious osseous
findings. Mild degenerative changes throughout the spine.

CT ABDOMEN AND PELVIS FINDINGS

Hepatobiliary: Multiple calcified granulomas. There are no worrisome
hepatic findings on noncontrast imaging. No evidence of gallstones,
gallbladder wall thickening or biliary dilatation.

Pancreas: Unremarkable. No pancreatic ductal dilatation or
surrounding inflammatory changes.

Spleen: Probable previous splenectomy with regenerated splenic
tissue in the left subphrenic area measuring up to 4.6 x 2.3 cm on
image 52.

Adrenals/Urinary Tract: Stable mild nodular thickening of the left
adrenal gland. The right adrenal gland appears normal. There is new
bilateral hydronephrosis and hydroureter. The renal pelves are
dilated in excess of the proximal ureters, although the distal
ureters are dilated. There is a possible caliceal calculus in the
upper pole of the right kidney, best seen on image 95 of series 6.
No evidence of ureteral calculus. Both kidneys demonstrate cortical
thinning. Small exophytic renal lesions are similar in size to
previous study and predominantly low in density. 19 mm lesion
projecting from the lower interpolar region of the left kidney on
axial image 63 measures 25 HU, although previously was lower in
density. Therefore, this is likely a mildly complex cyst. The
bladder is moderately distended with mild trabeculation.

Stomach/Bowel: No evidence of bowel wall thickening, distention or
surrounding inflammatory change. Mild distal colonic diverticulosis.

Vascular/Lymphatic: Stable mildly prominent lymph nodes in the
gastrohepatic ligament. There are no enlarged retroperitoneal or
pelvic lymph nodes. Mild aortic and branch vessel atherosclerosis.

Reproductive: Stable mild enlargement of the prostate gland.

Other: Probable postsurgical changes in the anterior abdominal wall.
Minimal peri-incisional herniation of omental fat.

Musculoskeletal: No acute or significant osseous findings. Mild
degenerative changes throughout the spine associated with a convex
right scoliosis.
IMPRESSION: 1. No evidence of right upper lobe infiltrate or nodule.
2. Small lymph nodes in the upper abdomen are stable. No progressive
adenopathy or other evidence of recurrent lymphoma.
3. Bilateral hydronephrosis and hydroureter, possibly secondary to
bladder distention and chronic bladder outlet obstruction. There may
be a component of UPJ narrowing bilaterally. Suggest renal
ultrasound pre and post voiding.
4. Exophytic renal lesions bilaterally are incompletely
characterized without contrast, although not significantly changed
in size from 0432, consistent with simple and mildly complex cysts.
5.  Aortic Atherosclerosis ([id number]).
